# Patient Record
Sex: Female | Born: 1935
Health system: Southern US, Community
[De-identification: ages and names within clinical notes are randomized; demographics above are authoritative.]

## PROBLEM LIST (undated history)

## (undated) DIAGNOSIS — K265 Chronic or unspecified duodenal ulcer with perforation: Secondary | ICD-10-CM

## (undated) DIAGNOSIS — I6529 Occlusion and stenosis of unspecified carotid artery: Secondary | ICD-10-CM

## (undated) DIAGNOSIS — K551 Chronic vascular disorders of intestine: Secondary | ICD-10-CM

## (undated) DIAGNOSIS — I480 Paroxysmal atrial fibrillation: Secondary | ICD-10-CM

## (undated) DIAGNOSIS — I10 Essential (primary) hypertension: Secondary | ICD-10-CM

## (undated) DIAGNOSIS — Z9289 Personal history of other medical treatment: Secondary | ICD-10-CM

## (undated) DIAGNOSIS — J449 Chronic obstructive pulmonary disease, unspecified: Secondary | ICD-10-CM

## (undated) DIAGNOSIS — C50919 Malignant neoplasm of unspecified site of unspecified female breast: Secondary | ICD-10-CM

## (undated) DIAGNOSIS — E669 Obesity, unspecified: Secondary | ICD-10-CM

## (undated) DIAGNOSIS — I739 Peripheral vascular disease, unspecified: Secondary | ICD-10-CM

## (undated) DIAGNOSIS — I5042 Chronic combined systolic (congestive) and diastolic (congestive) heart failure: Secondary | ICD-10-CM

## (undated) DIAGNOSIS — R5381 Other malaise: Secondary | ICD-10-CM

## (undated) DIAGNOSIS — G5600 Carpal tunnel syndrome, unspecified upper limb: Secondary | ICD-10-CM

## (undated) DIAGNOSIS — I771 Stricture of artery: Secondary | ICD-10-CM

## (undated) DIAGNOSIS — E039 Hypothyroidism, unspecified: Secondary | ICD-10-CM

## (undated) DIAGNOSIS — R32 Unspecified urinary incontinence: Secondary | ICD-10-CM

## (undated) DIAGNOSIS — L700 Acne vulgaris: Secondary | ICD-10-CM

## (undated) DIAGNOSIS — E785 Hyperlipidemia, unspecified: Secondary | ICD-10-CM

## (undated) DIAGNOSIS — C50412 Malignant neoplasm of upper-outer quadrant of left female breast: Secondary | ICD-10-CM

## (undated) DIAGNOSIS — R159 Full incontinence of feces: Secondary | ICD-10-CM

## (undated) DIAGNOSIS — M1990 Unspecified osteoarthritis, unspecified site: Secondary | ICD-10-CM

## (undated) DIAGNOSIS — H409 Unspecified glaucoma: Secondary | ICD-10-CM

## (undated) DIAGNOSIS — E118 Type 2 diabetes mellitus with unspecified complications: Secondary | ICD-10-CM

## (undated) DIAGNOSIS — Z87898 Personal history of other specified conditions: Secondary | ICD-10-CM

## (undated) HISTORY — DX: Personal history of other medical treatment: Z92.89

## (undated) HISTORY — DX: Essential (primary) hypertension: I10

## (undated) HISTORY — DX: Hyperlipidemia, unspecified: E78.5

## (undated) HISTORY — PX: TONSILLECTOMY: SUR1361

## (undated) HISTORY — PX: CARPAL TUNNEL RELEASE: SHX101

## (undated) HISTORY — DX: Other malaise: R53.81

## (undated) HISTORY — PX: MASTECTOMY: SHX3

## (undated) HISTORY — DX: Chronic vascular disorders of intestine: K55.1

## (undated) HISTORY — DX: Carpal tunnel syndrome, unspecified upper limb: G56.00

## (undated) HISTORY — DX: Paroxysmal atrial fibrillation: I48.0

## (undated) HISTORY — PX: UMBILICAL HERNIA REPAIR: SUR1181

## (undated) HISTORY — DX: Chronic combined systolic (congestive) and diastolic (congestive) heart failure: I50.42

## (undated) HISTORY — DX: Acne vulgaris: L70.0

## (undated) HISTORY — DX: Personal history of other specified conditions: Z87.898

## (undated) HISTORY — DX: Unspecified glaucoma: H40.9

## (undated) HISTORY — DX: Chronic or unspecified duodenal ulcer with perforation: K26.5

## (undated) HISTORY — DX: Full incontinence of feces: R15.9

## (undated) HISTORY — DX: Stricture of artery: I77.1

## (undated) HISTORY — DX: Unspecified osteoarthritis, unspecified site: M19.90

## (undated) HISTORY — DX: Hypothyroidism, unspecified: E03.9

## (undated) HISTORY — DX: Malignant neoplasm of upper-outer quadrant of left female breast: C50.412

## (undated) HISTORY — DX: Obesity, unspecified: E66.9

## (undated) HISTORY — PX: PARTIAL HYSTERECTOMY: SHX80

## (undated) HISTORY — DX: Malignant neoplasm of unspecified site of unspecified female breast: C50.919

## (undated) HISTORY — DX: Peripheral vascular disease, unspecified: I73.9

## (undated) HISTORY — DX: Type 2 diabetes mellitus with unspecified complications: E11.8

## (undated) HISTORY — DX: Unspecified urinary incontinence: R32

## (undated) HISTORY — DX: Occlusion and stenosis of unspecified carotid artery: I65.29

## (undated) HISTORY — DX: Hypomagnesemia: E83.42

---

## 2003-04-21 HISTORY — PX: BOWEL RESECTION: SHX1257

## 2004-12-14 ENCOUNTER — Emergency Department: Payer: Self-pay | Admitting: Emergency Medicine

## 2008-10-18 ENCOUNTER — Ambulatory Visit: Payer: Self-pay | Admitting: Internal Medicine

## 2008-10-30 ENCOUNTER — Inpatient Hospital Stay: Payer: Self-pay | Admitting: Internal Medicine

## 2008-11-03 ENCOUNTER — Inpatient Hospital Stay: Payer: Self-pay | Admitting: Internal Medicine

## 2008-11-18 ENCOUNTER — Ambulatory Visit: Payer: Self-pay | Admitting: Internal Medicine

## 2008-12-06 ENCOUNTER — Ambulatory Visit: Payer: Self-pay | Admitting: Internal Medicine

## 2008-12-09 ENCOUNTER — Inpatient Hospital Stay: Payer: Self-pay | Admitting: Specialist

## 2010-04-04 ENCOUNTER — Inpatient Hospital Stay: Payer: Self-pay | Admitting: Internal Medicine

## 2011-04-02 DIAGNOSIS — D510 Vitamin B12 deficiency anemia due to intrinsic factor deficiency: Secondary | ICD-10-CM | POA: Diagnosis not present

## 2011-04-02 DIAGNOSIS — M6281 Muscle weakness (generalized): Secondary | ICD-10-CM | POA: Diagnosis not present

## 2011-04-02 DIAGNOSIS — I4891 Unspecified atrial fibrillation: Secondary | ICD-10-CM | POA: Diagnosis not present

## 2011-04-02 DIAGNOSIS — E119 Type 2 diabetes mellitus without complications: Secondary | ICD-10-CM | POA: Diagnosis not present

## 2011-04-02 DIAGNOSIS — R55 Syncope and collapse: Secondary | ICD-10-CM | POA: Diagnosis not present

## 2011-04-02 DIAGNOSIS — I951 Orthostatic hypotension: Secondary | ICD-10-CM | POA: Diagnosis not present

## 2011-04-04 ENCOUNTER — Inpatient Hospital Stay: Payer: Self-pay | Admitting: Internal Medicine

## 2011-04-04 DIAGNOSIS — I359 Nonrheumatic aortic valve disorder, unspecified: Secondary | ICD-10-CM

## 2011-04-04 DIAGNOSIS — I4891 Unspecified atrial fibrillation: Secondary | ICD-10-CM

## 2011-04-06 DIAGNOSIS — I959 Hypotension, unspecified: Secondary | ICD-10-CM

## 2011-04-08 DIAGNOSIS — I951 Orthostatic hypotension: Secondary | ICD-10-CM | POA: Diagnosis not present

## 2011-04-08 DIAGNOSIS — I4891 Unspecified atrial fibrillation: Secondary | ICD-10-CM | POA: Diagnosis not present

## 2011-04-08 DIAGNOSIS — E119 Type 2 diabetes mellitus without complications: Secondary | ICD-10-CM | POA: Diagnosis not present

## 2011-04-08 DIAGNOSIS — D510 Vitamin B12 deficiency anemia due to intrinsic factor deficiency: Secondary | ICD-10-CM | POA: Diagnosis not present

## 2011-04-08 DIAGNOSIS — R55 Syncope and collapse: Secondary | ICD-10-CM | POA: Diagnosis not present

## 2011-04-08 DIAGNOSIS — M6281 Muscle weakness (generalized): Secondary | ICD-10-CM | POA: Diagnosis not present

## 2011-04-09 DIAGNOSIS — E119 Type 2 diabetes mellitus without complications: Secondary | ICD-10-CM | POA: Diagnosis not present

## 2011-04-09 DIAGNOSIS — I4891 Unspecified atrial fibrillation: Secondary | ICD-10-CM | POA: Diagnosis not present

## 2011-04-09 DIAGNOSIS — M6281 Muscle weakness (generalized): Secondary | ICD-10-CM | POA: Diagnosis not present

## 2011-04-09 DIAGNOSIS — I951 Orthostatic hypotension: Secondary | ICD-10-CM | POA: Diagnosis not present

## 2011-04-09 DIAGNOSIS — R55 Syncope and collapse: Secondary | ICD-10-CM | POA: Diagnosis not present

## 2011-04-09 DIAGNOSIS — D510 Vitamin B12 deficiency anemia due to intrinsic factor deficiency: Secondary | ICD-10-CM | POA: Diagnosis not present

## 2011-04-16 DIAGNOSIS — R55 Syncope and collapse: Secondary | ICD-10-CM | POA: Diagnosis not present

## 2011-04-16 DIAGNOSIS — M6281 Muscle weakness (generalized): Secondary | ICD-10-CM | POA: Diagnosis not present

## 2011-04-16 DIAGNOSIS — E119 Type 2 diabetes mellitus without complications: Secondary | ICD-10-CM | POA: Diagnosis not present

## 2011-04-16 DIAGNOSIS — I951 Orthostatic hypotension: Secondary | ICD-10-CM | POA: Diagnosis not present

## 2011-04-16 DIAGNOSIS — D510 Vitamin B12 deficiency anemia due to intrinsic factor deficiency: Secondary | ICD-10-CM | POA: Diagnosis not present

## 2011-04-16 DIAGNOSIS — I4891 Unspecified atrial fibrillation: Secondary | ICD-10-CM | POA: Diagnosis not present

## 2011-04-17 DIAGNOSIS — R55 Syncope and collapse: Secondary | ICD-10-CM | POA: Diagnosis not present

## 2011-04-17 DIAGNOSIS — I951 Orthostatic hypotension: Secondary | ICD-10-CM | POA: Diagnosis not present

## 2011-04-17 DIAGNOSIS — I4891 Unspecified atrial fibrillation: Secondary | ICD-10-CM | POA: Diagnosis not present

## 2011-04-17 DIAGNOSIS — E119 Type 2 diabetes mellitus without complications: Secondary | ICD-10-CM | POA: Diagnosis not present

## 2011-04-17 DIAGNOSIS — D510 Vitamin B12 deficiency anemia due to intrinsic factor deficiency: Secondary | ICD-10-CM | POA: Diagnosis not present

## 2011-04-17 DIAGNOSIS — M6281 Muscle weakness (generalized): Secondary | ICD-10-CM | POA: Diagnosis not present

## 2011-04-20 DIAGNOSIS — M6281 Muscle weakness (generalized): Secondary | ICD-10-CM | POA: Diagnosis not present

## 2011-04-20 DIAGNOSIS — I951 Orthostatic hypotension: Secondary | ICD-10-CM | POA: Diagnosis not present

## 2011-04-20 DIAGNOSIS — R55 Syncope and collapse: Secondary | ICD-10-CM | POA: Diagnosis not present

## 2011-04-20 DIAGNOSIS — I4891 Unspecified atrial fibrillation: Secondary | ICD-10-CM | POA: Diagnosis not present

## 2011-04-20 DIAGNOSIS — D510 Vitamin B12 deficiency anemia due to intrinsic factor deficiency: Secondary | ICD-10-CM | POA: Diagnosis not present

## 2011-04-20 DIAGNOSIS — E119 Type 2 diabetes mellitus without complications: Secondary | ICD-10-CM | POA: Diagnosis not present

## 2011-04-22 DIAGNOSIS — I951 Orthostatic hypotension: Secondary | ICD-10-CM | POA: Diagnosis not present

## 2011-04-22 DIAGNOSIS — D510 Vitamin B12 deficiency anemia due to intrinsic factor deficiency: Secondary | ICD-10-CM | POA: Diagnosis not present

## 2011-04-22 DIAGNOSIS — R55 Syncope and collapse: Secondary | ICD-10-CM | POA: Diagnosis not present

## 2011-04-22 DIAGNOSIS — E119 Type 2 diabetes mellitus without complications: Secondary | ICD-10-CM | POA: Diagnosis not present

## 2011-04-22 DIAGNOSIS — M6281 Muscle weakness (generalized): Secondary | ICD-10-CM | POA: Diagnosis not present

## 2011-04-22 DIAGNOSIS — I4891 Unspecified atrial fibrillation: Secondary | ICD-10-CM | POA: Diagnosis not present

## 2011-04-23 DIAGNOSIS — I4891 Unspecified atrial fibrillation: Secondary | ICD-10-CM | POA: Diagnosis not present

## 2011-04-23 DIAGNOSIS — R55 Syncope and collapse: Secondary | ICD-10-CM | POA: Diagnosis not present

## 2011-04-23 DIAGNOSIS — D510 Vitamin B12 deficiency anemia due to intrinsic factor deficiency: Secondary | ICD-10-CM | POA: Diagnosis not present

## 2011-04-23 DIAGNOSIS — M6281 Muscle weakness (generalized): Secondary | ICD-10-CM | POA: Diagnosis not present

## 2011-04-23 DIAGNOSIS — I951 Orthostatic hypotension: Secondary | ICD-10-CM | POA: Diagnosis not present

## 2011-04-23 DIAGNOSIS — E119 Type 2 diabetes mellitus without complications: Secondary | ICD-10-CM | POA: Diagnosis not present

## 2011-04-24 DIAGNOSIS — I4891 Unspecified atrial fibrillation: Secondary | ICD-10-CM | POA: Diagnosis not present

## 2011-04-27 DIAGNOSIS — M6281 Muscle weakness (generalized): Secondary | ICD-10-CM | POA: Diagnosis not present

## 2011-04-27 DIAGNOSIS — I951 Orthostatic hypotension: Secondary | ICD-10-CM | POA: Diagnosis not present

## 2011-04-27 DIAGNOSIS — I4891 Unspecified atrial fibrillation: Secondary | ICD-10-CM | POA: Diagnosis not present

## 2011-04-27 DIAGNOSIS — E119 Type 2 diabetes mellitus without complications: Secondary | ICD-10-CM | POA: Diagnosis not present

## 2011-04-27 DIAGNOSIS — R55 Syncope and collapse: Secondary | ICD-10-CM | POA: Diagnosis not present

## 2011-04-27 DIAGNOSIS — D510 Vitamin B12 deficiency anemia due to intrinsic factor deficiency: Secondary | ICD-10-CM | POA: Diagnosis not present

## 2011-04-30 DIAGNOSIS — I4891 Unspecified atrial fibrillation: Secondary | ICD-10-CM | POA: Diagnosis not present

## 2011-04-30 DIAGNOSIS — R55 Syncope and collapse: Secondary | ICD-10-CM | POA: Diagnosis not present

## 2011-04-30 DIAGNOSIS — E119 Type 2 diabetes mellitus without complications: Secondary | ICD-10-CM | POA: Diagnosis not present

## 2011-04-30 DIAGNOSIS — I951 Orthostatic hypotension: Secondary | ICD-10-CM | POA: Diagnosis not present

## 2011-04-30 DIAGNOSIS — M6281 Muscle weakness (generalized): Secondary | ICD-10-CM | POA: Diagnosis not present

## 2011-04-30 DIAGNOSIS — D510 Vitamin B12 deficiency anemia due to intrinsic factor deficiency: Secondary | ICD-10-CM | POA: Diagnosis not present

## 2011-05-01 DIAGNOSIS — I4891 Unspecified atrial fibrillation: Secondary | ICD-10-CM | POA: Diagnosis not present

## 2011-05-04 DIAGNOSIS — I951 Orthostatic hypotension: Secondary | ICD-10-CM | POA: Diagnosis not present

## 2011-05-04 DIAGNOSIS — E119 Type 2 diabetes mellitus without complications: Secondary | ICD-10-CM | POA: Diagnosis not present

## 2011-05-04 DIAGNOSIS — D510 Vitamin B12 deficiency anemia due to intrinsic factor deficiency: Secondary | ICD-10-CM | POA: Diagnosis not present

## 2011-05-04 DIAGNOSIS — R55 Syncope and collapse: Secondary | ICD-10-CM | POA: Diagnosis not present

## 2011-05-04 DIAGNOSIS — I4891 Unspecified atrial fibrillation: Secondary | ICD-10-CM | POA: Diagnosis not present

## 2011-05-04 DIAGNOSIS — M6281 Muscle weakness (generalized): Secondary | ICD-10-CM | POA: Diagnosis not present

## 2011-05-06 DIAGNOSIS — D510 Vitamin B12 deficiency anemia due to intrinsic factor deficiency: Secondary | ICD-10-CM | POA: Diagnosis not present

## 2011-05-06 DIAGNOSIS — I951 Orthostatic hypotension: Secondary | ICD-10-CM | POA: Diagnosis not present

## 2011-05-06 DIAGNOSIS — E119 Type 2 diabetes mellitus without complications: Secondary | ICD-10-CM | POA: Diagnosis not present

## 2011-05-06 DIAGNOSIS — M6281 Muscle weakness (generalized): Secondary | ICD-10-CM | POA: Diagnosis not present

## 2011-05-06 DIAGNOSIS — R55 Syncope and collapse: Secondary | ICD-10-CM | POA: Diagnosis not present

## 2011-05-06 DIAGNOSIS — I4891 Unspecified atrial fibrillation: Secondary | ICD-10-CM | POA: Diagnosis not present

## 2011-05-07 DIAGNOSIS — I951 Orthostatic hypotension: Secondary | ICD-10-CM | POA: Diagnosis not present

## 2011-05-07 DIAGNOSIS — I4891 Unspecified atrial fibrillation: Secondary | ICD-10-CM | POA: Diagnosis not present

## 2011-05-07 DIAGNOSIS — D510 Vitamin B12 deficiency anemia due to intrinsic factor deficiency: Secondary | ICD-10-CM | POA: Diagnosis not present

## 2011-05-07 DIAGNOSIS — M6281 Muscle weakness (generalized): Secondary | ICD-10-CM | POA: Diagnosis not present

## 2011-05-07 DIAGNOSIS — E119 Type 2 diabetes mellitus without complications: Secondary | ICD-10-CM | POA: Diagnosis not present

## 2011-05-07 DIAGNOSIS — R55 Syncope and collapse: Secondary | ICD-10-CM | POA: Diagnosis not present

## 2011-05-11 DIAGNOSIS — I4891 Unspecified atrial fibrillation: Secondary | ICD-10-CM | POA: Diagnosis not present

## 2011-05-11 DIAGNOSIS — R404 Transient alteration of awareness: Secondary | ICD-10-CM | POA: Diagnosis not present

## 2011-05-11 DIAGNOSIS — G309 Alzheimer's disease, unspecified: Secondary | ICD-10-CM | POA: Diagnosis not present

## 2011-05-12 DIAGNOSIS — D510 Vitamin B12 deficiency anemia due to intrinsic factor deficiency: Secondary | ICD-10-CM | POA: Diagnosis not present

## 2011-05-12 DIAGNOSIS — I4891 Unspecified atrial fibrillation: Secondary | ICD-10-CM | POA: Diagnosis not present

## 2011-05-12 DIAGNOSIS — R55 Syncope and collapse: Secondary | ICD-10-CM | POA: Diagnosis not present

## 2011-05-12 DIAGNOSIS — E119 Type 2 diabetes mellitus without complications: Secondary | ICD-10-CM | POA: Diagnosis not present

## 2011-05-12 DIAGNOSIS — M6281 Muscle weakness (generalized): Secondary | ICD-10-CM | POA: Diagnosis not present

## 2011-05-12 DIAGNOSIS — I951 Orthostatic hypotension: Secondary | ICD-10-CM | POA: Diagnosis not present

## 2011-05-14 DIAGNOSIS — I951 Orthostatic hypotension: Secondary | ICD-10-CM | POA: Diagnosis not present

## 2011-05-14 DIAGNOSIS — I4891 Unspecified atrial fibrillation: Secondary | ICD-10-CM | POA: Diagnosis not present

## 2011-05-14 DIAGNOSIS — D510 Vitamin B12 deficiency anemia due to intrinsic factor deficiency: Secondary | ICD-10-CM | POA: Diagnosis not present

## 2011-05-14 DIAGNOSIS — M6281 Muscle weakness (generalized): Secondary | ICD-10-CM | POA: Diagnosis not present

## 2011-05-14 DIAGNOSIS — E119 Type 2 diabetes mellitus without complications: Secondary | ICD-10-CM | POA: Diagnosis not present

## 2011-05-14 DIAGNOSIS — R55 Syncope and collapse: Secondary | ICD-10-CM | POA: Diagnosis not present

## 2011-05-18 DIAGNOSIS — I4891 Unspecified atrial fibrillation: Secondary | ICD-10-CM | POA: Diagnosis not present

## 2011-05-18 DIAGNOSIS — R55 Syncope and collapse: Secondary | ICD-10-CM | POA: Diagnosis not present

## 2011-05-18 DIAGNOSIS — M6281 Muscle weakness (generalized): Secondary | ICD-10-CM | POA: Diagnosis not present

## 2011-05-18 DIAGNOSIS — E119 Type 2 diabetes mellitus without complications: Secondary | ICD-10-CM | POA: Diagnosis not present

## 2011-05-18 DIAGNOSIS — I951 Orthostatic hypotension: Secondary | ICD-10-CM | POA: Diagnosis not present

## 2011-05-18 DIAGNOSIS — D510 Vitamin B12 deficiency anemia due to intrinsic factor deficiency: Secondary | ICD-10-CM | POA: Diagnosis not present

## 2011-05-19 DIAGNOSIS — I4891 Unspecified atrial fibrillation: Secondary | ICD-10-CM | POA: Diagnosis not present

## 2011-05-19 DIAGNOSIS — E119 Type 2 diabetes mellitus without complications: Secondary | ICD-10-CM | POA: Diagnosis not present

## 2011-05-19 DIAGNOSIS — I10 Essential (primary) hypertension: Secondary | ICD-10-CM | POA: Diagnosis not present

## 2011-05-19 DIAGNOSIS — E785 Hyperlipidemia, unspecified: Secondary | ICD-10-CM | POA: Diagnosis not present

## 2011-05-20 DIAGNOSIS — M6281 Muscle weakness (generalized): Secondary | ICD-10-CM | POA: Diagnosis not present

## 2011-05-20 DIAGNOSIS — E119 Type 2 diabetes mellitus without complications: Secondary | ICD-10-CM | POA: Diagnosis not present

## 2011-05-20 DIAGNOSIS — R55 Syncope and collapse: Secondary | ICD-10-CM | POA: Diagnosis not present

## 2011-05-20 DIAGNOSIS — I10 Essential (primary) hypertension: Secondary | ICD-10-CM | POA: Diagnosis not present

## 2011-05-20 DIAGNOSIS — I951 Orthostatic hypotension: Secondary | ICD-10-CM | POA: Diagnosis not present

## 2011-05-20 DIAGNOSIS — Z0000 Encounter for general adult medical examination without abnormal findings: Secondary | ICD-10-CM | POA: Diagnosis not present

## 2011-05-20 DIAGNOSIS — E039 Hypothyroidism, unspecified: Secondary | ICD-10-CM | POA: Diagnosis not present

## 2011-05-20 DIAGNOSIS — I4891 Unspecified atrial fibrillation: Secondary | ICD-10-CM | POA: Diagnosis not present

## 2011-05-20 DIAGNOSIS — D510 Vitamin B12 deficiency anemia due to intrinsic factor deficiency: Secondary | ICD-10-CM | POA: Diagnosis not present

## 2011-05-20 DIAGNOSIS — Z23 Encounter for immunization: Secondary | ICD-10-CM | POA: Diagnosis not present

## 2011-05-22 DIAGNOSIS — E119 Type 2 diabetes mellitus without complications: Secondary | ICD-10-CM | POA: Diagnosis not present

## 2011-05-22 DIAGNOSIS — R55 Syncope and collapse: Secondary | ICD-10-CM | POA: Diagnosis not present

## 2011-05-22 DIAGNOSIS — I4891 Unspecified atrial fibrillation: Secondary | ICD-10-CM | POA: Diagnosis not present

## 2011-05-22 DIAGNOSIS — D510 Vitamin B12 deficiency anemia due to intrinsic factor deficiency: Secondary | ICD-10-CM | POA: Diagnosis not present

## 2011-05-22 DIAGNOSIS — M6281 Muscle weakness (generalized): Secondary | ICD-10-CM | POA: Diagnosis not present

## 2011-05-22 DIAGNOSIS — I951 Orthostatic hypotension: Secondary | ICD-10-CM | POA: Diagnosis not present

## 2011-05-25 DIAGNOSIS — D510 Vitamin B12 deficiency anemia due to intrinsic factor deficiency: Secondary | ICD-10-CM | POA: Diagnosis not present

## 2011-05-25 DIAGNOSIS — I4891 Unspecified atrial fibrillation: Secondary | ICD-10-CM | POA: Diagnosis not present

## 2011-05-25 DIAGNOSIS — R55 Syncope and collapse: Secondary | ICD-10-CM | POA: Diagnosis not present

## 2011-05-25 DIAGNOSIS — E119 Type 2 diabetes mellitus without complications: Secondary | ICD-10-CM | POA: Diagnosis not present

## 2011-05-25 DIAGNOSIS — M6281 Muscle weakness (generalized): Secondary | ICD-10-CM | POA: Diagnosis not present

## 2011-05-25 DIAGNOSIS — I951 Orthostatic hypotension: Secondary | ICD-10-CM | POA: Diagnosis not present

## 2011-05-26 DIAGNOSIS — I4891 Unspecified atrial fibrillation: Secondary | ICD-10-CM | POA: Diagnosis not present

## 2011-05-28 DIAGNOSIS — I951 Orthostatic hypotension: Secondary | ICD-10-CM | POA: Diagnosis not present

## 2011-05-28 DIAGNOSIS — D510 Vitamin B12 deficiency anemia due to intrinsic factor deficiency: Secondary | ICD-10-CM | POA: Diagnosis not present

## 2011-05-28 DIAGNOSIS — E119 Type 2 diabetes mellitus without complications: Secondary | ICD-10-CM | POA: Diagnosis not present

## 2011-05-28 DIAGNOSIS — R55 Syncope and collapse: Secondary | ICD-10-CM | POA: Diagnosis not present

## 2011-05-28 DIAGNOSIS — M6281 Muscle weakness (generalized): Secondary | ICD-10-CM | POA: Diagnosis not present

## 2011-05-28 DIAGNOSIS — I4891 Unspecified atrial fibrillation: Secondary | ICD-10-CM | POA: Diagnosis not present

## 2011-06-01 ENCOUNTER — Encounter: Payer: Self-pay | Admitting: *Deleted

## 2011-06-01 DIAGNOSIS — I4891 Unspecified atrial fibrillation: Secondary | ICD-10-CM | POA: Diagnosis not present

## 2011-06-01 DIAGNOSIS — I951 Orthostatic hypotension: Secondary | ICD-10-CM | POA: Diagnosis not present

## 2011-06-01 DIAGNOSIS — E119 Type 2 diabetes mellitus without complications: Secondary | ICD-10-CM | POA: Diagnosis not present

## 2011-06-01 DIAGNOSIS — D510 Vitamin B12 deficiency anemia due to intrinsic factor deficiency: Secondary | ICD-10-CM | POA: Diagnosis not present

## 2011-06-01 DIAGNOSIS — R55 Syncope and collapse: Secondary | ICD-10-CM | POA: Diagnosis not present

## 2011-06-01 DIAGNOSIS — M6281 Muscle weakness (generalized): Secondary | ICD-10-CM | POA: Diagnosis not present

## 2011-06-02 DIAGNOSIS — E119 Type 2 diabetes mellitus without complications: Secondary | ICD-10-CM | POA: Diagnosis not present

## 2011-06-02 DIAGNOSIS — M6281 Muscle weakness (generalized): Secondary | ICD-10-CM | POA: Diagnosis not present

## 2011-06-02 DIAGNOSIS — I4891 Unspecified atrial fibrillation: Secondary | ICD-10-CM | POA: Diagnosis not present

## 2011-06-02 DIAGNOSIS — I951 Orthostatic hypotension: Secondary | ICD-10-CM | POA: Diagnosis not present

## 2011-06-02 DIAGNOSIS — D510 Vitamin B12 deficiency anemia due to intrinsic factor deficiency: Secondary | ICD-10-CM | POA: Diagnosis not present

## 2011-06-02 DIAGNOSIS — R55 Syncope and collapse: Secondary | ICD-10-CM | POA: Diagnosis not present

## 2011-06-03 DIAGNOSIS — I4891 Unspecified atrial fibrillation: Secondary | ICD-10-CM | POA: Diagnosis not present

## 2011-06-04 ENCOUNTER — Ambulatory Visit (INDEPENDENT_AMBULATORY_CARE_PROVIDER_SITE_OTHER): Payer: Medicare Other | Admitting: Cardiovascular Disease

## 2011-06-04 ENCOUNTER — Encounter: Payer: Self-pay | Admitting: Cardiovascular Disease

## 2011-06-04 VITALS — BP 162/86 | HR 84 | Ht 65.0 in | Wt 196.8 lb

## 2011-06-04 DIAGNOSIS — E669 Obesity, unspecified: Secondary | ICD-10-CM

## 2011-06-04 DIAGNOSIS — R569 Unspecified convulsions: Secondary | ICD-10-CM

## 2011-06-04 DIAGNOSIS — I6350 Cerebral infarction due to unspecified occlusion or stenosis of unspecified cerebral artery: Secondary | ICD-10-CM

## 2011-06-04 DIAGNOSIS — J449 Chronic obstructive pulmonary disease, unspecified: Secondary | ICD-10-CM

## 2011-06-04 DIAGNOSIS — E119 Type 2 diabetes mellitus without complications: Secondary | ICD-10-CM

## 2011-06-04 DIAGNOSIS — I639 Cerebral infarction, unspecified: Secondary | ICD-10-CM

## 2011-06-04 DIAGNOSIS — I4891 Unspecified atrial fibrillation: Secondary | ICD-10-CM | POA: Diagnosis not present

## 2011-06-04 HISTORY — DX: Obesity, unspecified: E66.9

## 2011-06-04 MED ORDER — METOPROLOL SUCCINATE ER 100 MG PO TB24: 100.0000 mg | ORAL_TABLET | Freq: Two times a day (BID) | ORAL | Status: DC

## 2011-06-04 NOTE — Assessment & Plan Note (Addendum)
We have encouraged continued exercise, careful diet management in an effort to lose weight. She is limited by her chronic knee pain.

## 2011-06-04 NOTE — Assessment & Plan Note (Signed)
She appears to be maintaining normal sinus rhythm currently. Heart rate and blood pressure are mildly elevated. We will add extra metoprolol succinate 50 mg in the p.m. In addition to 100 mg in the a.m.Marland Kitchen

## 2011-06-04 NOTE — Progress Notes (Signed)
Patient ID: Mia Padilla, female    DOB: March 14, 1935, 77 y.o.   MRN: [id number]  HPI Comments: Mia Padilla is a 77 year old woman with history of stroke, seizure disorder, hypertension, COPD, diabetes, hypothyroidism, obesity, atrial fibrillation on recent hospital admission December 15 for mental status changes. During her hospital course, she had an echocardiogram showing normal LV function, carotid ultrasound showing no significant disease, head CT and MRI showing no significant stroke. She reports having a "code" called on her in the hospital for inability to form a sleep. Notes indicate possible TIA. She was started on warfarin for atrial fibrillation and presents today in normal sinus rhythm.  She reports that her energy is reasonably good. She has severe bilateral knee pain and hip pain. She denies any significant shortness of breath or chest pain. She does not carry a diagnosis of coronary artery disease. It does not appear that she has had a recent stress test.  Notes indicate that she has had a history of seizures, possibly with low glucose levels. Previous diagnosis of complex partial seizures and she was started on antiseizure medications. She reports that she is being weaned off her keppra.  She currently has home nursing. She does not know what her blood pressure measurements are at home though believes they are reasonable.   EKG shows normal sinus rhythm with rate 90 beats per minute with nonspecific ST abnormality EKG in the emergency room showed atrial fibrillation with ventricular rate 67 beats per minute   Outpatient Encounter Prescriptions as of 06/04/2011  Medication Sig Dispense Refill  . albuterol (PROVENTIL HFA;VENTOLIN HFA) 108 (90 BASE) MCG/ACT inhaler Inhale 2 puffs into the lungs every 6 (six) hours as needed.      Marland Kitchen aspirin EC 81 MG tablet Take 81 mg by mouth daily.      . brimonidine (ALPHAGAN P) 0.1 % SOLN       . digoxin (LANOXIN) 0.125 MG tablet Take 125 mcg by mouth  daily.      . dorzolamide (TRUSOPT) 2 % ophthalmic solution 1 drop 3 (three) times daily.      . fluticasone (FLONASE) 50 MCG/ACT nasal spray Place 2 sprays into the nose daily.      . Fluticasone-Salmeterol (ADVAIR) 250-50 MCG/DOSE AEPB Inhale 1 puff into the lungs every 12 (twelve) hours.      Marland Kitchen guaiFENesin (MUCINEX) 600 MG 12 hr tablet Take 1,200 mg by mouth 2 (two) times daily.      . insulin aspart (NOVOLOG) 100 UNIT/ML injection Inject into the skin. Sliding scale      . insulin detemir (LEVEMIR) 100 UNIT/ML injection Inject 23 Units into the skin at bedtime.      Marland Kitchen levothyroxine (SYNTHROID, LEVOTHROID) 75 MCG tablet Take 75 mcg by mouth daily.      . meclizine (ANTIVERT) 25 MG tablet Take 25 mg by mouth 3 (three) times daily as needed.      . metFORMIN (GLUCOPHAGE) 500 MG tablet Take 500 mg by mouth 2 (two) times daily with a meal.      . METOPROLOL SUCCINATE ER PO Take 100 mg by mouth daily.       . simvastatin (ZOCOR) 40 MG tablet Take 40 mg by mouth every evening.      . solifenacin (VESICARE) 5 MG tablet Take 10 mg by mouth daily.      Marland Kitchen warfarin (COUMADIN) 10 MG tablet Take 10 mg by mouth daily.       Review of Systems  Constitutional:  Negative.   HENT: Negative.   Eyes: Negative.   Respiratory: Negative.   Cardiovascular: Negative.   Gastrointestinal: Negative.   Musculoskeletal: Positive for back pain, joint swelling, arthralgias and gait problem.  Skin: Negative.   Neurological: Negative.   Hematological: Negative.   Psychiatric/Behavioral: Negative.   All other systems reviewed and are negative.    BP 162/86  Pulse 84  Ht 5\' 5"  (1.651 m)  Wt 196 lb 12 oz (89.245 kg)  BMI 32.74 kg/m2  Physical Exam  Nursing note and vitals reviewed. Constitutional: She is oriented to person, place, and time. She appears well-developed and well-nourished.       Obese, walks with a walker  HENT:  Head: Normocephalic.  Nose: Nose normal.  Mouth/Throat: Oropharynx is clear and  moist.  Eyes: Conjunctivae are normal. Pupils are equal, round, and reactive to light.  Neck: Normal range of motion. Neck supple. No JVD present.  Cardiovascular: Normal rate, regular rhythm, S1 normal, S2 normal and intact distal pulses.  Exam reveals no gallop and no friction rub.   Murmur heard.  Crescendo systolic murmur is present with a grade of 2/6  Pulmonary/Chest: Effort normal and breath sounds normal. No respiratory distress. She has no wheezes. She has no rales. She exhibits no tenderness.  Abdominal: Soft. Bowel sounds are normal. She exhibits no distension. There is no tenderness.  Musculoskeletal: Normal range of motion. She exhibits no edema and no tenderness.  Lymphadenopathy:    She has no cervical adenopathy.  Neurological: She is alert and oriented to person, place, and time. Coordination normal.  Skin: Skin is warm and dry. No rash noted. No erythema.  Psychiatric: She has a normal mood and affect. Her behavior is normal. Judgment and thought content normal.         Assessment and Plan

## 2011-06-04 NOTE — Patient Instructions (Signed)
You are doing well. Please increase the metoprolol.   Take a full pill in the morning and a 1/2 pill at dinner  If you have any worsening shortness of breath or chest pain, please call the office  Please call us if you have new issues that need to be addressed before your next appt.  Your physician wants you to follow-up in: 3 months.  You will receive a reminder letter in the mail two months in advance. If you don't receive a letter, please call our office to schedule the follow-up appointment.

## 2011-06-04 NOTE — Assessment & Plan Note (Signed)
Recent hospital admission in December 2012 concerning for TIA. She is currently on warfarin for witnessed atrial fibrillation.

## 2011-06-04 NOTE — Assessment & Plan Note (Signed)
She is seen by Dr. Sherryll Burger. No stroke seen on MRI.

## 2011-06-04 NOTE — Assessment & Plan Note (Signed)
She denies any significant shortness of breath. Currently does not smoke. She stopped smoking in the 1970s.

## 2011-06-17 DIAGNOSIS — I4891 Unspecified atrial fibrillation: Secondary | ICD-10-CM | POA: Diagnosis not present

## 2011-06-18 DIAGNOSIS — H259 Unspecified age-related cataract: Secondary | ICD-10-CM | POA: Diagnosis not present

## 2011-06-18 DIAGNOSIS — H4011X0 Primary open-angle glaucoma, stage unspecified: Secondary | ICD-10-CM | POA: Diagnosis not present

## 2011-06-18 DIAGNOSIS — H409 Unspecified glaucoma: Secondary | ICD-10-CM | POA: Diagnosis not present

## 2011-06-22 DIAGNOSIS — I4891 Unspecified atrial fibrillation: Secondary | ICD-10-CM | POA: Diagnosis not present

## 2011-06-22 DIAGNOSIS — R55 Syncope and collapse: Secondary | ICD-10-CM | POA: Diagnosis not present

## 2011-06-22 DIAGNOSIS — D510 Vitamin B12 deficiency anemia due to intrinsic factor deficiency: Secondary | ICD-10-CM | POA: Diagnosis not present

## 2011-06-22 DIAGNOSIS — I951 Orthostatic hypotension: Secondary | ICD-10-CM | POA: Diagnosis not present

## 2011-06-24 DIAGNOSIS — I4891 Unspecified atrial fibrillation: Secondary | ICD-10-CM | POA: Diagnosis not present

## 2011-06-30 DIAGNOSIS — I4891 Unspecified atrial fibrillation: Secondary | ICD-10-CM | POA: Diagnosis not present

## 2011-06-30 DIAGNOSIS — E119 Type 2 diabetes mellitus without complications: Secondary | ICD-10-CM | POA: Diagnosis not present

## 2011-06-30 DIAGNOSIS — D510 Vitamin B12 deficiency anemia due to intrinsic factor deficiency: Secondary | ICD-10-CM | POA: Diagnosis not present

## 2011-06-30 DIAGNOSIS — M6281 Muscle weakness (generalized): Secondary | ICD-10-CM | POA: Diagnosis not present

## 2011-06-30 DIAGNOSIS — R55 Syncope and collapse: Secondary | ICD-10-CM | POA: Diagnosis not present

## 2011-06-30 DIAGNOSIS — I951 Orthostatic hypotension: Secondary | ICD-10-CM | POA: Diagnosis not present

## 2011-07-08 DIAGNOSIS — I4891 Unspecified atrial fibrillation: Secondary | ICD-10-CM | POA: Diagnosis not present

## 2011-07-17 ENCOUNTER — Encounter: Payer: Self-pay | Admitting: Cardiovascular Disease

## 2011-07-21 DIAGNOSIS — I872 Venous insufficiency (chronic) (peripheral): Secondary | ICD-10-CM | POA: Diagnosis not present

## 2011-07-21 DIAGNOSIS — I10 Essential (primary) hypertension: Secondary | ICD-10-CM | POA: Diagnosis not present

## 2011-07-21 DIAGNOSIS — M7989 Other specified soft tissue disorders: Secondary | ICD-10-CM | POA: Diagnosis not present

## 2011-07-22 DIAGNOSIS — I4891 Unspecified atrial fibrillation: Secondary | ICD-10-CM | POA: Diagnosis not present

## 2011-07-28 DIAGNOSIS — D510 Vitamin B12 deficiency anemia due to intrinsic factor deficiency: Secondary | ICD-10-CM | POA: Diagnosis not present

## 2011-07-28 DIAGNOSIS — I951 Orthostatic hypotension: Secondary | ICD-10-CM | POA: Diagnosis not present

## 2011-07-28 DIAGNOSIS — I4891 Unspecified atrial fibrillation: Secondary | ICD-10-CM | POA: Diagnosis not present

## 2011-07-28 DIAGNOSIS — R55 Syncope and collapse: Secondary | ICD-10-CM | POA: Diagnosis not present

## 2011-07-28 DIAGNOSIS — M6281 Muscle weakness (generalized): Secondary | ICD-10-CM | POA: Diagnosis not present

## 2011-07-28 DIAGNOSIS — E119 Type 2 diabetes mellitus without complications: Secondary | ICD-10-CM | POA: Diagnosis not present

## 2011-07-31 DIAGNOSIS — I4891 Unspecified atrial fibrillation: Secondary | ICD-10-CM | POA: Diagnosis not present

## 2011-07-31 DIAGNOSIS — E119 Type 2 diabetes mellitus without complications: Secondary | ICD-10-CM | POA: Diagnosis not present

## 2011-07-31 DIAGNOSIS — D510 Vitamin B12 deficiency anemia due to intrinsic factor deficiency: Secondary | ICD-10-CM | POA: Diagnosis not present

## 2011-07-31 DIAGNOSIS — I951 Orthostatic hypotension: Secondary | ICD-10-CM | POA: Diagnosis not present

## 2011-07-31 DIAGNOSIS — R55 Syncope and collapse: Secondary | ICD-10-CM | POA: Diagnosis not present

## 2011-07-31 DIAGNOSIS — M6281 Muscle weakness (generalized): Secondary | ICD-10-CM | POA: Diagnosis not present

## 2011-08-07 DIAGNOSIS — I4891 Unspecified atrial fibrillation: Secondary | ICD-10-CM | POA: Diagnosis not present

## 2011-08-07 DIAGNOSIS — R55 Syncope and collapse: Secondary | ICD-10-CM | POA: Diagnosis not present

## 2011-08-07 DIAGNOSIS — E119 Type 2 diabetes mellitus without complications: Secondary | ICD-10-CM | POA: Diagnosis not present

## 2011-08-07 DIAGNOSIS — M6281 Muscle weakness (generalized): Secondary | ICD-10-CM | POA: Diagnosis not present

## 2011-08-07 DIAGNOSIS — I951 Orthostatic hypotension: Secondary | ICD-10-CM | POA: Diagnosis not present

## 2011-08-07 DIAGNOSIS — D510 Vitamin B12 deficiency anemia due to intrinsic factor deficiency: Secondary | ICD-10-CM | POA: Diagnosis not present

## 2011-08-10 DIAGNOSIS — I4891 Unspecified atrial fibrillation: Secondary | ICD-10-CM | POA: Diagnosis not present

## 2011-08-10 DIAGNOSIS — M6281 Muscle weakness (generalized): Secondary | ICD-10-CM | POA: Diagnosis not present

## 2011-08-10 DIAGNOSIS — D510 Vitamin B12 deficiency anemia due to intrinsic factor deficiency: Secondary | ICD-10-CM | POA: Diagnosis not present

## 2011-08-10 DIAGNOSIS — I951 Orthostatic hypotension: Secondary | ICD-10-CM | POA: Diagnosis not present

## 2011-08-10 DIAGNOSIS — E119 Type 2 diabetes mellitus without complications: Secondary | ICD-10-CM | POA: Diagnosis not present

## 2011-08-10 DIAGNOSIS — R55 Syncope and collapse: Secondary | ICD-10-CM | POA: Diagnosis not present

## 2011-08-13 DIAGNOSIS — I4891 Unspecified atrial fibrillation: Secondary | ICD-10-CM | POA: Diagnosis not present

## 2011-08-13 DIAGNOSIS — R55 Syncope and collapse: Secondary | ICD-10-CM | POA: Diagnosis not present

## 2011-08-13 DIAGNOSIS — M6281 Muscle weakness (generalized): Secondary | ICD-10-CM | POA: Diagnosis not present

## 2011-08-13 DIAGNOSIS — I951 Orthostatic hypotension: Secondary | ICD-10-CM | POA: Diagnosis not present

## 2011-08-13 DIAGNOSIS — E119 Type 2 diabetes mellitus without complications: Secondary | ICD-10-CM | POA: Diagnosis not present

## 2011-08-13 DIAGNOSIS — D510 Vitamin B12 deficiency anemia due to intrinsic factor deficiency: Secondary | ICD-10-CM | POA: Diagnosis not present

## 2011-08-18 DIAGNOSIS — M6281 Muscle weakness (generalized): Secondary | ICD-10-CM | POA: Diagnosis not present

## 2011-08-18 DIAGNOSIS — E039 Hypothyroidism, unspecified: Secondary | ICD-10-CM | POA: Diagnosis not present

## 2011-08-18 DIAGNOSIS — E119 Type 2 diabetes mellitus without complications: Secondary | ICD-10-CM | POA: Diagnosis not present

## 2011-08-18 DIAGNOSIS — I4891 Unspecified atrial fibrillation: Secondary | ICD-10-CM | POA: Diagnosis not present

## 2011-08-18 DIAGNOSIS — D510 Vitamin B12 deficiency anemia due to intrinsic factor deficiency: Secondary | ICD-10-CM | POA: Diagnosis not present

## 2011-08-18 DIAGNOSIS — I951 Orthostatic hypotension: Secondary | ICD-10-CM | POA: Diagnosis not present

## 2011-08-18 DIAGNOSIS — R55 Syncope and collapse: Secondary | ICD-10-CM | POA: Diagnosis not present

## 2011-08-18 DIAGNOSIS — G40309 Generalized idiopathic epilepsy and epileptic syndromes, not intractable, without status epilepticus: Secondary | ICD-10-CM | POA: Diagnosis not present

## 2011-08-20 DIAGNOSIS — I4891 Unspecified atrial fibrillation: Secondary | ICD-10-CM | POA: Diagnosis not present

## 2011-08-20 DIAGNOSIS — E119 Type 2 diabetes mellitus without complications: Secondary | ICD-10-CM | POA: Diagnosis not present

## 2011-08-20 DIAGNOSIS — D510 Vitamin B12 deficiency anemia due to intrinsic factor deficiency: Secondary | ICD-10-CM | POA: Diagnosis not present

## 2011-08-20 DIAGNOSIS — R55 Syncope and collapse: Secondary | ICD-10-CM | POA: Diagnosis not present

## 2011-08-20 DIAGNOSIS — I951 Orthostatic hypotension: Secondary | ICD-10-CM | POA: Diagnosis not present

## 2011-08-20 DIAGNOSIS — M6281 Muscle weakness (generalized): Secondary | ICD-10-CM | POA: Diagnosis not present

## 2011-08-24 DIAGNOSIS — I4891 Unspecified atrial fibrillation: Secondary | ICD-10-CM | POA: Diagnosis not present

## 2011-08-24 DIAGNOSIS — M6281 Muscle weakness (generalized): Secondary | ICD-10-CM | POA: Diagnosis not present

## 2011-08-24 DIAGNOSIS — D510 Vitamin B12 deficiency anemia due to intrinsic factor deficiency: Secondary | ICD-10-CM | POA: Diagnosis not present

## 2011-08-24 DIAGNOSIS — E119 Type 2 diabetes mellitus without complications: Secondary | ICD-10-CM | POA: Diagnosis not present

## 2011-08-24 DIAGNOSIS — R55 Syncope and collapse: Secondary | ICD-10-CM | POA: Diagnosis not present

## 2011-08-24 DIAGNOSIS — I951 Orthostatic hypotension: Secondary | ICD-10-CM | POA: Diagnosis not present

## 2011-08-25 DIAGNOSIS — I4891 Unspecified atrial fibrillation: Secondary | ICD-10-CM | POA: Diagnosis not present

## 2011-08-25 DIAGNOSIS — I951 Orthostatic hypotension: Secondary | ICD-10-CM | POA: Diagnosis not present

## 2011-08-25 DIAGNOSIS — E119 Type 2 diabetes mellitus without complications: Secondary | ICD-10-CM | POA: Diagnosis not present

## 2011-08-25 DIAGNOSIS — R55 Syncope and collapse: Secondary | ICD-10-CM | POA: Diagnosis not present

## 2011-08-25 DIAGNOSIS — M6281 Muscle weakness (generalized): Secondary | ICD-10-CM | POA: Diagnosis not present

## 2011-08-25 DIAGNOSIS — D510 Vitamin B12 deficiency anemia due to intrinsic factor deficiency: Secondary | ICD-10-CM | POA: Diagnosis not present

## 2011-08-27 DIAGNOSIS — I951 Orthostatic hypotension: Secondary | ICD-10-CM | POA: Diagnosis not present

## 2011-08-27 DIAGNOSIS — D510 Vitamin B12 deficiency anemia due to intrinsic factor deficiency: Secondary | ICD-10-CM | POA: Diagnosis not present

## 2011-08-27 DIAGNOSIS — E119 Type 2 diabetes mellitus without complications: Secondary | ICD-10-CM | POA: Diagnosis not present

## 2011-08-27 DIAGNOSIS — I4891 Unspecified atrial fibrillation: Secondary | ICD-10-CM | POA: Diagnosis not present

## 2011-08-27 DIAGNOSIS — R55 Syncope and collapse: Secondary | ICD-10-CM | POA: Diagnosis not present

## 2011-08-27 DIAGNOSIS — M6281 Muscle weakness (generalized): Secondary | ICD-10-CM | POA: Diagnosis not present

## 2011-08-31 DIAGNOSIS — I4891 Unspecified atrial fibrillation: Secondary | ICD-10-CM | POA: Diagnosis not present

## 2011-08-31 DIAGNOSIS — I951 Orthostatic hypotension: Secondary | ICD-10-CM | POA: Diagnosis not present

## 2011-08-31 DIAGNOSIS — D510 Vitamin B12 deficiency anemia due to intrinsic factor deficiency: Secondary | ICD-10-CM | POA: Diagnosis not present

## 2011-08-31 DIAGNOSIS — R55 Syncope and collapse: Secondary | ICD-10-CM | POA: Diagnosis not present

## 2011-08-31 DIAGNOSIS — E119 Type 2 diabetes mellitus without complications: Secondary | ICD-10-CM | POA: Diagnosis not present

## 2011-08-31 DIAGNOSIS — M6281 Muscle weakness (generalized): Secondary | ICD-10-CM | POA: Diagnosis not present

## 2011-09-03 DIAGNOSIS — M6281 Muscle weakness (generalized): Secondary | ICD-10-CM | POA: Diagnosis not present

## 2011-09-03 DIAGNOSIS — I951 Orthostatic hypotension: Secondary | ICD-10-CM | POA: Diagnosis not present

## 2011-09-03 DIAGNOSIS — E119 Type 2 diabetes mellitus without complications: Secondary | ICD-10-CM | POA: Diagnosis not present

## 2011-09-03 DIAGNOSIS — R55 Syncope and collapse: Secondary | ICD-10-CM | POA: Diagnosis not present

## 2011-09-03 DIAGNOSIS — I4891 Unspecified atrial fibrillation: Secondary | ICD-10-CM | POA: Diagnosis not present

## 2011-09-03 DIAGNOSIS — D510 Vitamin B12 deficiency anemia due to intrinsic factor deficiency: Secondary | ICD-10-CM | POA: Diagnosis not present

## 2011-09-07 DIAGNOSIS — E119 Type 2 diabetes mellitus without complications: Secondary | ICD-10-CM | POA: Diagnosis not present

## 2011-09-07 DIAGNOSIS — I4891 Unspecified atrial fibrillation: Secondary | ICD-10-CM | POA: Diagnosis not present

## 2011-09-07 DIAGNOSIS — M6281 Muscle weakness (generalized): Secondary | ICD-10-CM | POA: Diagnosis not present

## 2011-09-07 DIAGNOSIS — I951 Orthostatic hypotension: Secondary | ICD-10-CM | POA: Diagnosis not present

## 2011-09-07 DIAGNOSIS — D510 Vitamin B12 deficiency anemia due to intrinsic factor deficiency: Secondary | ICD-10-CM | POA: Diagnosis not present

## 2011-09-07 DIAGNOSIS — R55 Syncope and collapse: Secondary | ICD-10-CM | POA: Diagnosis not present

## 2011-09-10 DIAGNOSIS — I4891 Unspecified atrial fibrillation: Secondary | ICD-10-CM | POA: Diagnosis not present

## 2011-09-10 DIAGNOSIS — H4011X0 Primary open-angle glaucoma, stage unspecified: Secondary | ICD-10-CM | POA: Diagnosis not present

## 2011-09-10 DIAGNOSIS — E119 Type 2 diabetes mellitus without complications: Secondary | ICD-10-CM | POA: Diagnosis not present

## 2011-09-10 DIAGNOSIS — H409 Unspecified glaucoma: Secondary | ICD-10-CM | POA: Diagnosis not present

## 2011-09-10 DIAGNOSIS — D510 Vitamin B12 deficiency anemia due to intrinsic factor deficiency: Secondary | ICD-10-CM | POA: Diagnosis not present

## 2011-09-10 DIAGNOSIS — H259 Unspecified age-related cataract: Secondary | ICD-10-CM | POA: Diagnosis not present

## 2011-09-10 DIAGNOSIS — R55 Syncope and collapse: Secondary | ICD-10-CM | POA: Diagnosis not present

## 2011-09-10 DIAGNOSIS — M6281 Muscle weakness (generalized): Secondary | ICD-10-CM | POA: Diagnosis not present

## 2011-09-10 DIAGNOSIS — I951 Orthostatic hypotension: Secondary | ICD-10-CM | POA: Diagnosis not present

## 2011-09-14 DIAGNOSIS — R55 Syncope and collapse: Secondary | ICD-10-CM | POA: Diagnosis not present

## 2011-09-14 DIAGNOSIS — I4891 Unspecified atrial fibrillation: Secondary | ICD-10-CM | POA: Diagnosis not present

## 2011-09-14 DIAGNOSIS — I951 Orthostatic hypotension: Secondary | ICD-10-CM | POA: Diagnosis not present

## 2011-09-14 DIAGNOSIS — D510 Vitamin B12 deficiency anemia due to intrinsic factor deficiency: Secondary | ICD-10-CM | POA: Diagnosis not present

## 2011-09-14 DIAGNOSIS — E119 Type 2 diabetes mellitus without complications: Secondary | ICD-10-CM | POA: Diagnosis not present

## 2011-09-14 DIAGNOSIS — M6281 Muscle weakness (generalized): Secondary | ICD-10-CM | POA: Diagnosis not present

## 2011-09-16 DIAGNOSIS — I4891 Unspecified atrial fibrillation: Secondary | ICD-10-CM | POA: Diagnosis not present

## 2011-09-17 DIAGNOSIS — E119 Type 2 diabetes mellitus without complications: Secondary | ICD-10-CM | POA: Diagnosis not present

## 2011-09-17 DIAGNOSIS — M6281 Muscle weakness (generalized): Secondary | ICD-10-CM | POA: Diagnosis not present

## 2011-09-17 DIAGNOSIS — R55 Syncope and collapse: Secondary | ICD-10-CM | POA: Diagnosis not present

## 2011-09-17 DIAGNOSIS — I4891 Unspecified atrial fibrillation: Secondary | ICD-10-CM | POA: Diagnosis not present

## 2011-09-17 DIAGNOSIS — I951 Orthostatic hypotension: Secondary | ICD-10-CM | POA: Diagnosis not present

## 2011-09-17 DIAGNOSIS — D510 Vitamin B12 deficiency anemia due to intrinsic factor deficiency: Secondary | ICD-10-CM | POA: Diagnosis not present

## 2011-09-21 DIAGNOSIS — I951 Orthostatic hypotension: Secondary | ICD-10-CM | POA: Diagnosis not present

## 2011-09-21 DIAGNOSIS — M6281 Muscle weakness (generalized): Secondary | ICD-10-CM | POA: Diagnosis not present

## 2011-09-21 DIAGNOSIS — R55 Syncope and collapse: Secondary | ICD-10-CM | POA: Diagnosis not present

## 2011-09-21 DIAGNOSIS — E119 Type 2 diabetes mellitus without complications: Secondary | ICD-10-CM | POA: Diagnosis not present

## 2011-09-21 DIAGNOSIS — I4891 Unspecified atrial fibrillation: Secondary | ICD-10-CM | POA: Diagnosis not present

## 2011-09-21 DIAGNOSIS — D510 Vitamin B12 deficiency anemia due to intrinsic factor deficiency: Secondary | ICD-10-CM | POA: Diagnosis not present

## 2011-09-25 DIAGNOSIS — E119 Type 2 diabetes mellitus without complications: Secondary | ICD-10-CM | POA: Diagnosis not present

## 2011-09-25 DIAGNOSIS — I4891 Unspecified atrial fibrillation: Secondary | ICD-10-CM | POA: Diagnosis not present

## 2011-09-25 DIAGNOSIS — D510 Vitamin B12 deficiency anemia due to intrinsic factor deficiency: Secondary | ICD-10-CM | POA: Diagnosis not present

## 2011-09-25 DIAGNOSIS — M6281 Muscle weakness (generalized): Secondary | ICD-10-CM | POA: Diagnosis not present

## 2011-09-25 DIAGNOSIS — R55 Syncope and collapse: Secondary | ICD-10-CM | POA: Diagnosis not present

## 2011-09-25 DIAGNOSIS — I951 Orthostatic hypotension: Secondary | ICD-10-CM | POA: Diagnosis not present

## 2011-09-28 DIAGNOSIS — D510 Vitamin B12 deficiency anemia due to intrinsic factor deficiency: Secondary | ICD-10-CM | POA: Diagnosis not present

## 2011-09-28 DIAGNOSIS — M6281 Muscle weakness (generalized): Secondary | ICD-10-CM | POA: Diagnosis not present

## 2011-09-28 DIAGNOSIS — I951 Orthostatic hypotension: Secondary | ICD-10-CM | POA: Diagnosis not present

## 2011-09-28 DIAGNOSIS — R55 Syncope and collapse: Secondary | ICD-10-CM | POA: Diagnosis not present

## 2011-09-28 DIAGNOSIS — E119 Type 2 diabetes mellitus without complications: Secondary | ICD-10-CM | POA: Diagnosis not present

## 2011-09-28 DIAGNOSIS — I4891 Unspecified atrial fibrillation: Secondary | ICD-10-CM | POA: Diagnosis not present

## 2011-09-29 DIAGNOSIS — E119 Type 2 diabetes mellitus without complications: Secondary | ICD-10-CM | POA: Diagnosis not present

## 2011-09-29 DIAGNOSIS — D510 Vitamin B12 deficiency anemia due to intrinsic factor deficiency: Secondary | ICD-10-CM | POA: Diagnosis not present

## 2011-09-29 DIAGNOSIS — I4891 Unspecified atrial fibrillation: Secondary | ICD-10-CM | POA: Diagnosis not present

## 2011-09-29 DIAGNOSIS — M6281 Muscle weakness (generalized): Secondary | ICD-10-CM | POA: Diagnosis not present

## 2011-09-29 DIAGNOSIS — R55 Syncope and collapse: Secondary | ICD-10-CM | POA: Diagnosis not present

## 2011-09-29 DIAGNOSIS — I951 Orthostatic hypotension: Secondary | ICD-10-CM | POA: Diagnosis not present

## 2011-09-30 DIAGNOSIS — I4891 Unspecified atrial fibrillation: Secondary | ICD-10-CM | POA: Diagnosis not present

## 2011-09-30 DIAGNOSIS — D510 Vitamin B12 deficiency anemia due to intrinsic factor deficiency: Secondary | ICD-10-CM | POA: Diagnosis not present

## 2011-09-30 DIAGNOSIS — R55 Syncope and collapse: Secondary | ICD-10-CM | POA: Diagnosis not present

## 2011-09-30 DIAGNOSIS — E119 Type 2 diabetes mellitus without complications: Secondary | ICD-10-CM | POA: Diagnosis not present

## 2011-09-30 DIAGNOSIS — I951 Orthostatic hypotension: Secondary | ICD-10-CM | POA: Diagnosis not present

## 2011-09-30 DIAGNOSIS — M6281 Muscle weakness (generalized): Secondary | ICD-10-CM | POA: Diagnosis not present

## 2011-10-02 DIAGNOSIS — R55 Syncope and collapse: Secondary | ICD-10-CM | POA: Diagnosis not present

## 2011-10-02 DIAGNOSIS — I951 Orthostatic hypotension: Secondary | ICD-10-CM | POA: Diagnosis not present

## 2011-10-02 DIAGNOSIS — I4891 Unspecified atrial fibrillation: Secondary | ICD-10-CM | POA: Diagnosis not present

## 2011-10-02 DIAGNOSIS — M6281 Muscle weakness (generalized): Secondary | ICD-10-CM | POA: Diagnosis not present

## 2011-10-02 DIAGNOSIS — E119 Type 2 diabetes mellitus without complications: Secondary | ICD-10-CM | POA: Diagnosis not present

## 2011-10-02 DIAGNOSIS — D510 Vitamin B12 deficiency anemia due to intrinsic factor deficiency: Secondary | ICD-10-CM | POA: Diagnosis not present

## 2011-10-05 DIAGNOSIS — E119 Type 2 diabetes mellitus without complications: Secondary | ICD-10-CM | POA: Diagnosis not present

## 2011-10-05 DIAGNOSIS — M6281 Muscle weakness (generalized): Secondary | ICD-10-CM | POA: Diagnosis not present

## 2011-10-05 DIAGNOSIS — D510 Vitamin B12 deficiency anemia due to intrinsic factor deficiency: Secondary | ICD-10-CM | POA: Diagnosis not present

## 2011-10-05 DIAGNOSIS — I951 Orthostatic hypotension: Secondary | ICD-10-CM | POA: Diagnosis not present

## 2011-10-05 DIAGNOSIS — R55 Syncope and collapse: Secondary | ICD-10-CM | POA: Diagnosis not present

## 2011-10-05 DIAGNOSIS — I4891 Unspecified atrial fibrillation: Secondary | ICD-10-CM | POA: Diagnosis not present

## 2011-10-08 DIAGNOSIS — R55 Syncope and collapse: Secondary | ICD-10-CM | POA: Diagnosis not present

## 2011-10-08 DIAGNOSIS — D510 Vitamin B12 deficiency anemia due to intrinsic factor deficiency: Secondary | ICD-10-CM | POA: Diagnosis not present

## 2011-10-08 DIAGNOSIS — M6281 Muscle weakness (generalized): Secondary | ICD-10-CM | POA: Diagnosis not present

## 2011-10-08 DIAGNOSIS — I951 Orthostatic hypotension: Secondary | ICD-10-CM | POA: Diagnosis not present

## 2011-10-08 DIAGNOSIS — E119 Type 2 diabetes mellitus without complications: Secondary | ICD-10-CM | POA: Diagnosis not present

## 2011-10-08 DIAGNOSIS — I4891 Unspecified atrial fibrillation: Secondary | ICD-10-CM | POA: Diagnosis not present

## 2011-10-13 DIAGNOSIS — I951 Orthostatic hypotension: Secondary | ICD-10-CM | POA: Diagnosis not present

## 2011-10-13 DIAGNOSIS — I4891 Unspecified atrial fibrillation: Secondary | ICD-10-CM | POA: Diagnosis not present

## 2011-10-13 DIAGNOSIS — R55 Syncope and collapse: Secondary | ICD-10-CM | POA: Diagnosis not present

## 2011-10-13 DIAGNOSIS — D510 Vitamin B12 deficiency anemia due to intrinsic factor deficiency: Secondary | ICD-10-CM | POA: Diagnosis not present

## 2011-10-15 ENCOUNTER — Ambulatory Visit (INDEPENDENT_AMBULATORY_CARE_PROVIDER_SITE_OTHER): Payer: Medicare Other | Admitting: Cardiovascular Disease

## 2011-10-15 ENCOUNTER — Encounter: Payer: Self-pay | Admitting: Cardiovascular Disease

## 2011-10-15 VITALS — BP 170/76 | HR 78 | Ht 65.5 in | Wt 205.0 lb

## 2011-10-15 DIAGNOSIS — D510 Vitamin B12 deficiency anemia due to intrinsic factor deficiency: Secondary | ICD-10-CM | POA: Diagnosis not present

## 2011-10-15 DIAGNOSIS — I951 Orthostatic hypotension: Secondary | ICD-10-CM | POA: Diagnosis not present

## 2011-10-15 DIAGNOSIS — I4891 Unspecified atrial fibrillation: Secondary | ICD-10-CM

## 2011-10-15 DIAGNOSIS — E119 Type 2 diabetes mellitus without complications: Secondary | ICD-10-CM | POA: Diagnosis not present

## 2011-10-15 DIAGNOSIS — R55 Syncope and collapse: Secondary | ICD-10-CM | POA: Diagnosis not present

## 2011-10-15 DIAGNOSIS — M6281 Muscle weakness (generalized): Secondary | ICD-10-CM | POA: Diagnosis not present

## 2011-10-15 DIAGNOSIS — I10 Essential (primary) hypertension: Secondary | ICD-10-CM | POA: Diagnosis not present

## 2011-10-15 MED ORDER — LISINOPRIL 10 MG PO TABS: 10.0000 mg | ORAL_TABLET | Freq: Every day | ORAL | Status: DC

## 2011-10-15 NOTE — Assessment & Plan Note (Signed)
We have suggested she start lisinopril 10 mg daily. This can be titrated upwards as needed for blood pressure control.

## 2011-10-15 NOTE — Progress Notes (Signed)
Patient ID: Mia Padilla, female    DOB: 1934-06-25, 77 y.o.   MRN: [id number]  HPI Comments: Mia Padilla is a 77 year old woman with history of stroke, seizure disorder, hypertension, COPD, diabetes, hypothyroidism, obesity, atrial fibrillation on recent hospital admission April 04 2011 for mental status changes. During her hospital course, she had an echocardiogram showing normal LV function, carotid ultrasound showing no significant disease, head CT and MRI showing no significant stroke. She reports having a "code" called on her in the hospital.  Notes indicate possible TIA. She was started on warfarin for atrial fibrillation and presents today in normal sinus rhythm.  She reports that her energy is reasonably good. She has severe bilateral knee pain and hip pain. She denies any significant shortness of breath or chest pain. She does not carry a diagnosis of coronary artery disease. It does not appear that she has had a recent stress test. She reports having bilateral edema. She was started recently on Keflex for possible cellulitis. Coumadin was stopped secondary to unsteady gait by Dr. Dossie Arbour. She was started on Plavix 10/13/2011. She denies any recent falls. She reports that she understands the risk and benefit of warfarin. She denies having any palpitations or episodes concerning for atrial fibrillation.  Notes indicate that she has had a history of seizures, possibly with low glucose levels. Previous diagnosis of complex partial seizures and she was started on antiseizure medications. She reports that she is being weaned off her keppra. She currently has home nursing.  EKG shows normal sinus rhythm with rate 78 beats per minute, no significant ST or T wave changes   Outpatient Encounter Prescriptions as of 10/15/2011  Medication Sig Dispense Refill  . albuterol (PROVENTIL HFA;VENTOLIN HFA) 108 (90 BASE) MCG/ACT inhaler Inhale 2 puffs into the lungs every 6 (six) hours as needed.      Marland Kitchen  aspirin EC 81 MG tablet Take 81 mg by mouth daily.      . brimonidine (ALPHAGAN P) 0.1 % SOLN       . brinzolamide (AZOPT) 1 % ophthalmic suspension Apply 1 drop to eye 3 (three) times daily.      . cephALEXin (KEFLEX) 500 MG capsule Take 500 mg by mouth 4 (four) times daily. 7 days.      . clopidogrel (PLAVIX) 75 MG tablet Take 75 mg by mouth daily.      . digoxin (LANOXIN) 0.125 MG tablet Take 125 mcg by mouth daily.      . dorzolamide (TRUSOPT) 2 % ophthalmic solution 1 drop 3 (three) times daily.      . fluticasone (FLONASE) 50 MCG/ACT nasal spray Place 2 sprays into the nose daily.      . Fluticasone-Salmeterol (ADVAIR) 250-50 MCG/DOSE AEPB Inhale 1 puff into the lungs every 12 (twelve) hours.      Marland Kitchen guaiFENesin (MUCINEX) 600 MG 12 hr tablet Take 1,200 mg by mouth 2 (two) times daily.      . insulin aspart (NOVOLOG) 100 UNIT/ML injection Inject into the skin. Sliding scale      . insulin detemir (LEVEMIR) 100 UNIT/ML injection Inject 23 Units into the skin at bedtime.      Marland Kitchen levothyroxine (SYNTHROID, LEVOTHROID) 75 MCG tablet Take 75 mcg by mouth daily.      . meclizine (ANTIVERT) 25 MG tablet Take 25 mg by mouth 3 (three) times daily as needed.      . metFORMIN (GLUCOPHAGE) 500 MG tablet Take 500 mg by mouth 2 (two) times daily with  a meal.      . metoprolol succinate (TOPROL-XL) 100 MG 24 hr tablet Take 100 mg by mouth daily. And 50 mg in the PM      . simvastatin (ZOCOR) 40 MG tablet Take 40 mg by mouth every evening.      . solifenacin (VESICARE) 5 MG tablet Take 10 mg by mouth daily.      . travoprost, benzalkonium, (TRAVATAN) 0.004 % ophthalmic solution Apply 1 drop to eye at bedtime.      Marland Kitchen lisinopril (PRINIVIL,ZESTRIL) 10 MG tablet Take 1 tablet (10 mg total) by mouth daily.  30 tablet  6   Review of Systems  Constitutional: Negative.   HENT: Negative.   Eyes: Negative.   Respiratory: Negative.   Cardiovascular: Negative.   Gastrointestinal: Negative.   Musculoskeletal:  Positive for back pain, joint swelling, arthralgias and gait problem.  Skin: Negative.   Neurological: Negative.   Hematological: Negative.   Psychiatric/Behavioral: Negative.   All other systems reviewed and are negative.   BP 170/76  Pulse 78  Ht 5' 5.5" (1.664 m)  Wt 205 lb (92.987 kg)  BMI 33.59 kg/m2  Physical Exam  Nursing note and vitals reviewed. Constitutional: She is oriented to person, place, and time. She appears well-developed and well-nourished.       Obese, walks with a walker  HENT:  Head: Normocephalic.  Nose: Nose normal.  Mouth/Throat: Oropharynx is clear and moist.  Eyes: Conjunctivae are normal. Pupils are equal, round, and reactive to light.  Neck: Normal range of motion. Neck supple. No JVD present.  Cardiovascular: Normal rate, regular rhythm, S1 normal, S2 normal and intact distal pulses.  Exam reveals no gallop and no friction rub.   Murmur heard.  Crescendo systolic murmur is present with a grade of 2/6  Pulmonary/Chest: Effort normal and breath sounds normal. No respiratory distress. She has no wheezes. She has no rales. She exhibits no tenderness.  Abdominal: Soft. Bowel sounds are normal. She exhibits no distension. There is no tenderness.  Musculoskeletal: Normal range of motion. She exhibits no edema and no tenderness.  Lymphadenopathy:    She has no cervical adenopathy.  Neurological: She is alert and oriented to person, place, and time. Coordination normal.  Skin: Skin is warm and dry. No rash noted. No erythema.  Psychiatric: She has a normal mood and affect. Her behavior is normal. Judgment and thought content normal.         Assessment and Plan

## 2011-10-15 NOTE — Patient Instructions (Addendum)
You are doing well. Please start lisinopril 10 mg daily for high blood pressure  Ask the [facility] pharmacy about omeprazole one a day for stomach  Please call us if you have new issues that need to be addressed before your next appt.  Your physician wants you to follow-up in: 6 months.  You will receive a reminder letter in the mail two months in advance. If you don't receive a letter, please call our office to schedule the follow-up appointment.

## 2011-10-15 NOTE — Assessment & Plan Note (Signed)
Currently maintaining normal sinus rhythm on metoprolol

## 2011-10-15 NOTE — Assessment & Plan Note (Signed)
We have encouraged continued careful diet management in an effort to lose weight.  

## 2011-10-16 DIAGNOSIS — D510 Vitamin B12 deficiency anemia due to intrinsic factor deficiency: Secondary | ICD-10-CM | POA: Diagnosis not present

## 2011-10-16 DIAGNOSIS — M6281 Muscle weakness (generalized): Secondary | ICD-10-CM | POA: Diagnosis not present

## 2011-10-16 DIAGNOSIS — E119 Type 2 diabetes mellitus without complications: Secondary | ICD-10-CM | POA: Diagnosis not present

## 2011-10-16 DIAGNOSIS — I4891 Unspecified atrial fibrillation: Secondary | ICD-10-CM | POA: Diagnosis not present

## 2011-10-16 DIAGNOSIS — I951 Orthostatic hypotension: Secondary | ICD-10-CM | POA: Diagnosis not present

## 2011-10-16 DIAGNOSIS — R55 Syncope and collapse: Secondary | ICD-10-CM | POA: Diagnosis not present

## 2011-10-20 DIAGNOSIS — I4891 Unspecified atrial fibrillation: Secondary | ICD-10-CM | POA: Diagnosis not present

## 2011-10-20 DIAGNOSIS — E119 Type 2 diabetes mellitus without complications: Secondary | ICD-10-CM | POA: Diagnosis not present

## 2011-10-20 DIAGNOSIS — D510 Vitamin B12 deficiency anemia due to intrinsic factor deficiency: Secondary | ICD-10-CM | POA: Diagnosis not present

## 2011-10-20 DIAGNOSIS — I951 Orthostatic hypotension: Secondary | ICD-10-CM | POA: Diagnosis not present

## 2011-10-20 DIAGNOSIS — M6281 Muscle weakness (generalized): Secondary | ICD-10-CM | POA: Diagnosis not present

## 2011-10-20 DIAGNOSIS — R55 Syncope and collapse: Secondary | ICD-10-CM | POA: Diagnosis not present

## 2011-10-23 DIAGNOSIS — M6281 Muscle weakness (generalized): Secondary | ICD-10-CM | POA: Diagnosis not present

## 2011-10-23 DIAGNOSIS — I951 Orthostatic hypotension: Secondary | ICD-10-CM | POA: Diagnosis not present

## 2011-10-23 DIAGNOSIS — D510 Vitamin B12 deficiency anemia due to intrinsic factor deficiency: Secondary | ICD-10-CM | POA: Diagnosis not present

## 2011-10-23 DIAGNOSIS — R55 Syncope and collapse: Secondary | ICD-10-CM | POA: Diagnosis not present

## 2011-10-23 DIAGNOSIS — E119 Type 2 diabetes mellitus without complications: Secondary | ICD-10-CM | POA: Diagnosis not present

## 2011-10-23 DIAGNOSIS — I4891 Unspecified atrial fibrillation: Secondary | ICD-10-CM | POA: Diagnosis not present

## 2011-10-26 DIAGNOSIS — E119 Type 2 diabetes mellitus without complications: Secondary | ICD-10-CM | POA: Diagnosis not present

## 2011-10-26 DIAGNOSIS — I4891 Unspecified atrial fibrillation: Secondary | ICD-10-CM | POA: Diagnosis not present

## 2011-10-26 DIAGNOSIS — R55 Syncope and collapse: Secondary | ICD-10-CM | POA: Diagnosis not present

## 2011-10-26 DIAGNOSIS — D510 Vitamin B12 deficiency anemia due to intrinsic factor deficiency: Secondary | ICD-10-CM | POA: Diagnosis not present

## 2011-10-26 DIAGNOSIS — M6281 Muscle weakness (generalized): Secondary | ICD-10-CM | POA: Diagnosis not present

## 2011-10-26 DIAGNOSIS — I951 Orthostatic hypotension: Secondary | ICD-10-CM | POA: Diagnosis not present

## 2011-10-28 DIAGNOSIS — I951 Orthostatic hypotension: Secondary | ICD-10-CM | POA: Diagnosis not present

## 2011-10-28 DIAGNOSIS — D510 Vitamin B12 deficiency anemia due to intrinsic factor deficiency: Secondary | ICD-10-CM | POA: Diagnosis not present

## 2011-10-28 DIAGNOSIS — I4891 Unspecified atrial fibrillation: Secondary | ICD-10-CM | POA: Diagnosis not present

## 2011-10-28 DIAGNOSIS — E119 Type 2 diabetes mellitus without complications: Secondary | ICD-10-CM | POA: Diagnosis not present

## 2011-10-28 DIAGNOSIS — M6281 Muscle weakness (generalized): Secondary | ICD-10-CM | POA: Diagnosis not present

## 2011-10-28 DIAGNOSIS — R55 Syncope and collapse: Secondary | ICD-10-CM | POA: Diagnosis not present

## 2011-10-30 DIAGNOSIS — M6281 Muscle weakness (generalized): Secondary | ICD-10-CM | POA: Diagnosis not present

## 2011-10-30 DIAGNOSIS — R55 Syncope and collapse: Secondary | ICD-10-CM | POA: Diagnosis not present

## 2011-10-30 DIAGNOSIS — E119 Type 2 diabetes mellitus without complications: Secondary | ICD-10-CM | POA: Diagnosis not present

## 2011-10-30 DIAGNOSIS — I4891 Unspecified atrial fibrillation: Secondary | ICD-10-CM | POA: Diagnosis not present

## 2011-10-30 DIAGNOSIS — I951 Orthostatic hypotension: Secondary | ICD-10-CM | POA: Diagnosis not present

## 2011-10-30 DIAGNOSIS — D510 Vitamin B12 deficiency anemia due to intrinsic factor deficiency: Secondary | ICD-10-CM | POA: Diagnosis not present

## 2011-11-02 DIAGNOSIS — I4891 Unspecified atrial fibrillation: Secondary | ICD-10-CM | POA: Diagnosis not present

## 2011-11-02 DIAGNOSIS — E119 Type 2 diabetes mellitus without complications: Secondary | ICD-10-CM | POA: Diagnosis not present

## 2011-11-02 DIAGNOSIS — D510 Vitamin B12 deficiency anemia due to intrinsic factor deficiency: Secondary | ICD-10-CM | POA: Diagnosis not present

## 2011-11-02 DIAGNOSIS — M6281 Muscle weakness (generalized): Secondary | ICD-10-CM | POA: Diagnosis not present

## 2011-11-02 DIAGNOSIS — R55 Syncope and collapse: Secondary | ICD-10-CM | POA: Diagnosis not present

## 2011-11-02 DIAGNOSIS — I951 Orthostatic hypotension: Secondary | ICD-10-CM | POA: Diagnosis not present

## 2011-11-05 DIAGNOSIS — D510 Vitamin B12 deficiency anemia due to intrinsic factor deficiency: Secondary | ICD-10-CM | POA: Diagnosis not present

## 2011-11-05 DIAGNOSIS — M6281 Muscle weakness (generalized): Secondary | ICD-10-CM | POA: Diagnosis not present

## 2011-11-05 DIAGNOSIS — I4891 Unspecified atrial fibrillation: Secondary | ICD-10-CM | POA: Diagnosis not present

## 2011-11-05 DIAGNOSIS — E119 Type 2 diabetes mellitus without complications: Secondary | ICD-10-CM | POA: Diagnosis not present

## 2011-11-05 DIAGNOSIS — I951 Orthostatic hypotension: Secondary | ICD-10-CM | POA: Diagnosis not present

## 2011-11-05 DIAGNOSIS — R55 Syncope and collapse: Secondary | ICD-10-CM | POA: Diagnosis not present

## 2011-11-06 DIAGNOSIS — I4891 Unspecified atrial fibrillation: Secondary | ICD-10-CM | POA: Diagnosis not present

## 2011-11-06 DIAGNOSIS — E119 Type 2 diabetes mellitus without complications: Secondary | ICD-10-CM | POA: Diagnosis not present

## 2011-11-06 DIAGNOSIS — D510 Vitamin B12 deficiency anemia due to intrinsic factor deficiency: Secondary | ICD-10-CM | POA: Diagnosis not present

## 2011-11-06 DIAGNOSIS — M6281 Muscle weakness (generalized): Secondary | ICD-10-CM | POA: Diagnosis not present

## 2011-11-06 DIAGNOSIS — I951 Orthostatic hypotension: Secondary | ICD-10-CM | POA: Diagnosis not present

## 2011-11-06 DIAGNOSIS — R55 Syncope and collapse: Secondary | ICD-10-CM | POA: Diagnosis not present

## 2011-11-09 DIAGNOSIS — R55 Syncope and collapse: Secondary | ICD-10-CM | POA: Diagnosis not present

## 2011-11-09 DIAGNOSIS — D510 Vitamin B12 deficiency anemia due to intrinsic factor deficiency: Secondary | ICD-10-CM | POA: Diagnosis not present

## 2011-11-09 DIAGNOSIS — I4891 Unspecified atrial fibrillation: Secondary | ICD-10-CM | POA: Diagnosis not present

## 2011-11-09 DIAGNOSIS — M6281 Muscle weakness (generalized): Secondary | ICD-10-CM | POA: Diagnosis not present

## 2011-11-09 DIAGNOSIS — I951 Orthostatic hypotension: Secondary | ICD-10-CM | POA: Diagnosis not present

## 2011-11-09 DIAGNOSIS — E119 Type 2 diabetes mellitus without complications: Secondary | ICD-10-CM | POA: Diagnosis not present

## 2011-11-10 DIAGNOSIS — I951 Orthostatic hypotension: Secondary | ICD-10-CM | POA: Diagnosis not present

## 2011-11-10 DIAGNOSIS — R55 Syncope and collapse: Secondary | ICD-10-CM | POA: Diagnosis not present

## 2011-11-10 DIAGNOSIS — M6281 Muscle weakness (generalized): Secondary | ICD-10-CM | POA: Diagnosis not present

## 2011-11-10 DIAGNOSIS — I4891 Unspecified atrial fibrillation: Secondary | ICD-10-CM | POA: Diagnosis not present

## 2011-11-10 DIAGNOSIS — E119 Type 2 diabetes mellitus without complications: Secondary | ICD-10-CM | POA: Diagnosis not present

## 2011-11-10 DIAGNOSIS — D510 Vitamin B12 deficiency anemia due to intrinsic factor deficiency: Secondary | ICD-10-CM | POA: Diagnosis not present

## 2011-11-12 DIAGNOSIS — I951 Orthostatic hypotension: Secondary | ICD-10-CM | POA: Diagnosis not present

## 2011-11-12 DIAGNOSIS — D510 Vitamin B12 deficiency anemia due to intrinsic factor deficiency: Secondary | ICD-10-CM | POA: Diagnosis not present

## 2011-11-12 DIAGNOSIS — H269 Unspecified cataract: Secondary | ICD-10-CM | POA: Diagnosis not present

## 2011-11-12 DIAGNOSIS — M6281 Muscle weakness (generalized): Secondary | ICD-10-CM | POA: Diagnosis not present

## 2011-11-12 DIAGNOSIS — R55 Syncope and collapse: Secondary | ICD-10-CM | POA: Diagnosis not present

## 2011-11-12 DIAGNOSIS — E119 Type 2 diabetes mellitus without complications: Secondary | ICD-10-CM | POA: Diagnosis not present

## 2011-11-12 DIAGNOSIS — I4891 Unspecified atrial fibrillation: Secondary | ICD-10-CM | POA: Diagnosis not present

## 2011-11-12 DIAGNOSIS — H409 Unspecified glaucoma: Secondary | ICD-10-CM | POA: Diagnosis not present

## 2011-11-12 DIAGNOSIS — H4011X0 Primary open-angle glaucoma, stage unspecified: Secondary | ICD-10-CM | POA: Diagnosis not present

## 2011-11-13 DIAGNOSIS — M6281 Muscle weakness (generalized): Secondary | ICD-10-CM | POA: Diagnosis not present

## 2011-11-13 DIAGNOSIS — R55 Syncope and collapse: Secondary | ICD-10-CM | POA: Diagnosis not present

## 2011-11-13 DIAGNOSIS — I4891 Unspecified atrial fibrillation: Secondary | ICD-10-CM | POA: Diagnosis not present

## 2011-11-13 DIAGNOSIS — E119 Type 2 diabetes mellitus without complications: Secondary | ICD-10-CM | POA: Diagnosis not present

## 2011-11-13 DIAGNOSIS — D510 Vitamin B12 deficiency anemia due to intrinsic factor deficiency: Secondary | ICD-10-CM | POA: Diagnosis not present

## 2011-11-13 DIAGNOSIS — I951 Orthostatic hypotension: Secondary | ICD-10-CM | POA: Diagnosis not present

## 2011-11-16 DIAGNOSIS — M6281 Muscle weakness (generalized): Secondary | ICD-10-CM | POA: Diagnosis not present

## 2011-11-16 DIAGNOSIS — I4891 Unspecified atrial fibrillation: Secondary | ICD-10-CM | POA: Diagnosis not present

## 2011-11-16 DIAGNOSIS — I951 Orthostatic hypotension: Secondary | ICD-10-CM | POA: Diagnosis not present

## 2011-11-16 DIAGNOSIS — R55 Syncope and collapse: Secondary | ICD-10-CM | POA: Diagnosis not present

## 2011-11-16 DIAGNOSIS — D510 Vitamin B12 deficiency anemia due to intrinsic factor deficiency: Secondary | ICD-10-CM | POA: Diagnosis not present

## 2011-11-16 DIAGNOSIS — E119 Type 2 diabetes mellitus without complications: Secondary | ICD-10-CM | POA: Diagnosis not present

## 2011-11-17 DIAGNOSIS — R55 Syncope and collapse: Secondary | ICD-10-CM | POA: Diagnosis not present

## 2011-11-17 DIAGNOSIS — I4891 Unspecified atrial fibrillation: Secondary | ICD-10-CM | POA: Diagnosis not present

## 2011-11-17 DIAGNOSIS — E119 Type 2 diabetes mellitus without complications: Secondary | ICD-10-CM | POA: Diagnosis not present

## 2011-11-17 DIAGNOSIS — M6281 Muscle weakness (generalized): Secondary | ICD-10-CM | POA: Diagnosis not present

## 2011-11-17 DIAGNOSIS — I951 Orthostatic hypotension: Secondary | ICD-10-CM | POA: Diagnosis not present

## 2011-11-17 DIAGNOSIS — D510 Vitamin B12 deficiency anemia due to intrinsic factor deficiency: Secondary | ICD-10-CM | POA: Diagnosis not present

## 2011-11-18 DIAGNOSIS — I951 Orthostatic hypotension: Secondary | ICD-10-CM | POA: Diagnosis not present

## 2011-11-18 DIAGNOSIS — R55 Syncope and collapse: Secondary | ICD-10-CM | POA: Diagnosis not present

## 2011-11-18 DIAGNOSIS — I4891 Unspecified atrial fibrillation: Secondary | ICD-10-CM | POA: Diagnosis not present

## 2011-11-18 DIAGNOSIS — E119 Type 2 diabetes mellitus without complications: Secondary | ICD-10-CM | POA: Diagnosis not present

## 2011-11-18 DIAGNOSIS — D510 Vitamin B12 deficiency anemia due to intrinsic factor deficiency: Secondary | ICD-10-CM | POA: Diagnosis not present

## 2011-11-18 DIAGNOSIS — M6281 Muscle weakness (generalized): Secondary | ICD-10-CM | POA: Diagnosis not present

## 2011-11-19 DIAGNOSIS — M6281 Muscle weakness (generalized): Secondary | ICD-10-CM | POA: Diagnosis not present

## 2011-11-19 DIAGNOSIS — I4891 Unspecified atrial fibrillation: Secondary | ICD-10-CM | POA: Diagnosis not present

## 2011-11-19 DIAGNOSIS — E119 Type 2 diabetes mellitus without complications: Secondary | ICD-10-CM | POA: Diagnosis not present

## 2011-11-19 DIAGNOSIS — R55 Syncope and collapse: Secondary | ICD-10-CM | POA: Diagnosis not present

## 2011-11-19 DIAGNOSIS — I951 Orthostatic hypotension: Secondary | ICD-10-CM | POA: Diagnosis not present

## 2011-11-19 DIAGNOSIS — D510 Vitamin B12 deficiency anemia due to intrinsic factor deficiency: Secondary | ICD-10-CM | POA: Diagnosis not present

## 2011-11-20 DIAGNOSIS — M6281 Muscle weakness (generalized): Secondary | ICD-10-CM | POA: Diagnosis not present

## 2011-11-20 DIAGNOSIS — E119 Type 2 diabetes mellitus without complications: Secondary | ICD-10-CM | POA: Diagnosis not present

## 2011-11-20 DIAGNOSIS — D510 Vitamin B12 deficiency anemia due to intrinsic factor deficiency: Secondary | ICD-10-CM | POA: Diagnosis not present

## 2011-11-20 DIAGNOSIS — R55 Syncope and collapse: Secondary | ICD-10-CM | POA: Diagnosis not present

## 2011-11-20 DIAGNOSIS — I4891 Unspecified atrial fibrillation: Secondary | ICD-10-CM | POA: Diagnosis not present

## 2011-11-20 DIAGNOSIS — I951 Orthostatic hypotension: Secondary | ICD-10-CM | POA: Diagnosis not present

## 2011-11-23 DIAGNOSIS — R55 Syncope and collapse: Secondary | ICD-10-CM | POA: Diagnosis not present

## 2011-11-23 DIAGNOSIS — E119 Type 2 diabetes mellitus without complications: Secondary | ICD-10-CM | POA: Diagnosis not present

## 2011-11-23 DIAGNOSIS — D510 Vitamin B12 deficiency anemia due to intrinsic factor deficiency: Secondary | ICD-10-CM | POA: Diagnosis not present

## 2011-11-23 DIAGNOSIS — I4891 Unspecified atrial fibrillation: Secondary | ICD-10-CM | POA: Diagnosis not present

## 2011-11-23 DIAGNOSIS — M6281 Muscle weakness (generalized): Secondary | ICD-10-CM | POA: Diagnosis not present

## 2011-11-23 DIAGNOSIS — I951 Orthostatic hypotension: Secondary | ICD-10-CM | POA: Diagnosis not present

## 2011-11-24 DIAGNOSIS — I951 Orthostatic hypotension: Secondary | ICD-10-CM | POA: Diagnosis not present

## 2011-11-24 DIAGNOSIS — I4891 Unspecified atrial fibrillation: Secondary | ICD-10-CM | POA: Diagnosis not present

## 2011-11-24 DIAGNOSIS — E7800 Pure hypercholesterolemia, unspecified: Secondary | ICD-10-CM | POA: Diagnosis not present

## 2011-11-24 DIAGNOSIS — R55 Syncope and collapse: Secondary | ICD-10-CM | POA: Diagnosis not present

## 2011-11-24 DIAGNOSIS — I10 Essential (primary) hypertension: Secondary | ICD-10-CM | POA: Diagnosis not present

## 2011-11-24 DIAGNOSIS — E785 Hyperlipidemia, unspecified: Secondary | ICD-10-CM | POA: Diagnosis not present

## 2011-11-24 DIAGNOSIS — E119 Type 2 diabetes mellitus without complications: Secondary | ICD-10-CM | POA: Diagnosis not present

## 2011-11-24 DIAGNOSIS — E039 Hypothyroidism, unspecified: Secondary | ICD-10-CM | POA: Diagnosis not present

## 2011-11-24 DIAGNOSIS — D510 Vitamin B12 deficiency anemia due to intrinsic factor deficiency: Secondary | ICD-10-CM | POA: Diagnosis not present

## 2011-11-24 DIAGNOSIS — M6281 Muscle weakness (generalized): Secondary | ICD-10-CM | POA: Diagnosis not present

## 2011-11-25 DIAGNOSIS — D510 Vitamin B12 deficiency anemia due to intrinsic factor deficiency: Secondary | ICD-10-CM | POA: Diagnosis not present

## 2011-11-25 DIAGNOSIS — I951 Orthostatic hypotension: Secondary | ICD-10-CM | POA: Diagnosis not present

## 2011-11-25 DIAGNOSIS — I4891 Unspecified atrial fibrillation: Secondary | ICD-10-CM | POA: Diagnosis not present

## 2011-11-25 DIAGNOSIS — E119 Type 2 diabetes mellitus without complications: Secondary | ICD-10-CM | POA: Diagnosis not present

## 2011-11-25 DIAGNOSIS — M6281 Muscle weakness (generalized): Secondary | ICD-10-CM | POA: Diagnosis not present

## 2011-11-25 DIAGNOSIS — R55 Syncope and collapse: Secondary | ICD-10-CM | POA: Diagnosis not present

## 2011-11-27 DIAGNOSIS — I951 Orthostatic hypotension: Secondary | ICD-10-CM | POA: Diagnosis not present

## 2011-11-27 DIAGNOSIS — I4891 Unspecified atrial fibrillation: Secondary | ICD-10-CM | POA: Diagnosis not present

## 2011-11-27 DIAGNOSIS — E119 Type 2 diabetes mellitus without complications: Secondary | ICD-10-CM | POA: Diagnosis not present

## 2011-11-27 DIAGNOSIS — M6281 Muscle weakness (generalized): Secondary | ICD-10-CM | POA: Diagnosis not present

## 2011-11-27 DIAGNOSIS — D510 Vitamin B12 deficiency anemia due to intrinsic factor deficiency: Secondary | ICD-10-CM | POA: Diagnosis not present

## 2011-11-27 DIAGNOSIS — R55 Syncope and collapse: Secondary | ICD-10-CM | POA: Diagnosis not present

## 2011-11-28 DIAGNOSIS — R55 Syncope and collapse: Secondary | ICD-10-CM | POA: Diagnosis not present

## 2011-11-28 DIAGNOSIS — I951 Orthostatic hypotension: Secondary | ICD-10-CM | POA: Diagnosis not present

## 2011-11-28 DIAGNOSIS — D510 Vitamin B12 deficiency anemia due to intrinsic factor deficiency: Secondary | ICD-10-CM | POA: Diagnosis not present

## 2011-11-28 DIAGNOSIS — I4891 Unspecified atrial fibrillation: Secondary | ICD-10-CM | POA: Diagnosis not present

## 2011-11-28 DIAGNOSIS — E119 Type 2 diabetes mellitus without complications: Secondary | ICD-10-CM | POA: Diagnosis not present

## 2011-11-28 DIAGNOSIS — M6281 Muscle weakness (generalized): Secondary | ICD-10-CM | POA: Diagnosis not present

## 2011-11-30 DIAGNOSIS — R55 Syncope and collapse: Secondary | ICD-10-CM | POA: Diagnosis not present

## 2011-11-30 DIAGNOSIS — I951 Orthostatic hypotension: Secondary | ICD-10-CM | POA: Diagnosis not present

## 2011-11-30 DIAGNOSIS — E119 Type 2 diabetes mellitus without complications: Secondary | ICD-10-CM | POA: Diagnosis not present

## 2011-11-30 DIAGNOSIS — M6281 Muscle weakness (generalized): Secondary | ICD-10-CM | POA: Diagnosis not present

## 2011-11-30 DIAGNOSIS — D510 Vitamin B12 deficiency anemia due to intrinsic factor deficiency: Secondary | ICD-10-CM | POA: Diagnosis not present

## 2011-11-30 DIAGNOSIS — I4891 Unspecified atrial fibrillation: Secondary | ICD-10-CM | POA: Diagnosis not present

## 2011-12-02 DIAGNOSIS — I951 Orthostatic hypotension: Secondary | ICD-10-CM | POA: Diagnosis not present

## 2011-12-02 DIAGNOSIS — M6281 Muscle weakness (generalized): Secondary | ICD-10-CM | POA: Diagnosis not present

## 2011-12-02 DIAGNOSIS — R55 Syncope and collapse: Secondary | ICD-10-CM | POA: Diagnosis not present

## 2011-12-02 DIAGNOSIS — I4891 Unspecified atrial fibrillation: Secondary | ICD-10-CM | POA: Diagnosis not present

## 2011-12-02 DIAGNOSIS — E119 Type 2 diabetes mellitus without complications: Secondary | ICD-10-CM | POA: Diagnosis not present

## 2011-12-02 DIAGNOSIS — D510 Vitamin B12 deficiency anemia due to intrinsic factor deficiency: Secondary | ICD-10-CM | POA: Diagnosis not present

## 2011-12-04 DIAGNOSIS — I4891 Unspecified atrial fibrillation: Secondary | ICD-10-CM | POA: Diagnosis not present

## 2011-12-04 DIAGNOSIS — M6281 Muscle weakness (generalized): Secondary | ICD-10-CM | POA: Diagnosis not present

## 2011-12-04 DIAGNOSIS — D510 Vitamin B12 deficiency anemia due to intrinsic factor deficiency: Secondary | ICD-10-CM | POA: Diagnosis not present

## 2011-12-04 DIAGNOSIS — I951 Orthostatic hypotension: Secondary | ICD-10-CM | POA: Diagnosis not present

## 2011-12-04 DIAGNOSIS — R55 Syncope and collapse: Secondary | ICD-10-CM | POA: Diagnosis not present

## 2011-12-04 DIAGNOSIS — E119 Type 2 diabetes mellitus without complications: Secondary | ICD-10-CM | POA: Diagnosis not present

## 2011-12-07 DIAGNOSIS — D510 Vitamin B12 deficiency anemia due to intrinsic factor deficiency: Secondary | ICD-10-CM | POA: Diagnosis not present

## 2011-12-07 DIAGNOSIS — I951 Orthostatic hypotension: Secondary | ICD-10-CM | POA: Diagnosis not present

## 2011-12-07 DIAGNOSIS — E119 Type 2 diabetes mellitus without complications: Secondary | ICD-10-CM | POA: Diagnosis not present

## 2011-12-07 DIAGNOSIS — I4891 Unspecified atrial fibrillation: Secondary | ICD-10-CM | POA: Diagnosis not present

## 2011-12-07 DIAGNOSIS — M6281 Muscle weakness (generalized): Secondary | ICD-10-CM | POA: Diagnosis not present

## 2011-12-07 DIAGNOSIS — R55 Syncope and collapse: Secondary | ICD-10-CM | POA: Diagnosis not present

## 2011-12-09 DIAGNOSIS — I4891 Unspecified atrial fibrillation: Secondary | ICD-10-CM | POA: Diagnosis not present

## 2011-12-09 DIAGNOSIS — E119 Type 2 diabetes mellitus without complications: Secondary | ICD-10-CM | POA: Diagnosis not present

## 2011-12-09 DIAGNOSIS — M6281 Muscle weakness (generalized): Secondary | ICD-10-CM | POA: Diagnosis not present

## 2011-12-09 DIAGNOSIS — R55 Syncope and collapse: Secondary | ICD-10-CM | POA: Diagnosis not present

## 2011-12-09 DIAGNOSIS — D510 Vitamin B12 deficiency anemia due to intrinsic factor deficiency: Secondary | ICD-10-CM | POA: Diagnosis not present

## 2011-12-09 DIAGNOSIS — I951 Orthostatic hypotension: Secondary | ICD-10-CM | POA: Diagnosis not present

## 2011-12-10 DIAGNOSIS — D510 Vitamin B12 deficiency anemia due to intrinsic factor deficiency: Secondary | ICD-10-CM | POA: Diagnosis not present

## 2011-12-10 DIAGNOSIS — R55 Syncope and collapse: Secondary | ICD-10-CM | POA: Diagnosis not present

## 2011-12-10 DIAGNOSIS — M6281 Muscle weakness (generalized): Secondary | ICD-10-CM | POA: Diagnosis not present

## 2011-12-10 DIAGNOSIS — E119 Type 2 diabetes mellitus without complications: Secondary | ICD-10-CM | POA: Diagnosis not present

## 2011-12-10 DIAGNOSIS — I4891 Unspecified atrial fibrillation: Secondary | ICD-10-CM | POA: Diagnosis not present

## 2011-12-10 DIAGNOSIS — I951 Orthostatic hypotension: Secondary | ICD-10-CM | POA: Diagnosis not present

## 2011-12-11 DIAGNOSIS — D510 Vitamin B12 deficiency anemia due to intrinsic factor deficiency: Secondary | ICD-10-CM | POA: Diagnosis not present

## 2011-12-11 DIAGNOSIS — R55 Syncope and collapse: Secondary | ICD-10-CM | POA: Diagnosis not present

## 2011-12-11 DIAGNOSIS — E119 Type 2 diabetes mellitus without complications: Secondary | ICD-10-CM | POA: Diagnosis not present

## 2011-12-11 DIAGNOSIS — I951 Orthostatic hypotension: Secondary | ICD-10-CM | POA: Diagnosis not present

## 2011-12-11 DIAGNOSIS — I4891 Unspecified atrial fibrillation: Secondary | ICD-10-CM | POA: Diagnosis not present

## 2011-12-11 DIAGNOSIS — M6281 Muscle weakness (generalized): Secondary | ICD-10-CM | POA: Diagnosis not present

## 2011-12-15 DIAGNOSIS — I739 Peripheral vascular disease, unspecified: Secondary | ICD-10-CM | POA: Diagnosis not present

## 2011-12-16 ENCOUNTER — Encounter: Payer: Self-pay | Admitting: Cardiothoracic Surgery

## 2011-12-16 ENCOUNTER — Encounter: Payer: Self-pay | Admitting: Nurse Practitioner

## 2011-12-16 DIAGNOSIS — M810 Age-related osteoporosis without current pathological fracture: Secondary | ICD-10-CM | POA: Diagnosis not present

## 2011-12-16 DIAGNOSIS — J449 Chronic obstructive pulmonary disease, unspecified: Secondary | ICD-10-CM | POA: Diagnosis not present

## 2011-12-16 DIAGNOSIS — I499 Cardiac arrhythmia, unspecified: Secondary | ICD-10-CM | POA: Diagnosis not present

## 2011-12-16 DIAGNOSIS — R55 Syncope and collapse: Secondary | ICD-10-CM | POA: Diagnosis not present

## 2011-12-16 DIAGNOSIS — I10 Essential (primary) hypertension: Secondary | ICD-10-CM | POA: Diagnosis not present

## 2011-12-16 DIAGNOSIS — L97809 Non-pressure chronic ulcer of other part of unspecified lower leg with unspecified severity: Secondary | ICD-10-CM | POA: Diagnosis not present

## 2011-12-16 DIAGNOSIS — D510 Vitamin B12 deficiency anemia due to intrinsic factor deficiency: Secondary | ICD-10-CM | POA: Diagnosis not present

## 2011-12-16 DIAGNOSIS — R609 Edema, unspecified: Secondary | ICD-10-CM | POA: Diagnosis not present

## 2011-12-16 DIAGNOSIS — I4891 Unspecified atrial fibrillation: Secondary | ICD-10-CM | POA: Diagnosis not present

## 2011-12-16 DIAGNOSIS — R159 Full incontinence of feces: Secondary | ICD-10-CM | POA: Diagnosis not present

## 2011-12-16 DIAGNOSIS — I951 Orthostatic hypotension: Secondary | ICD-10-CM | POA: Diagnosis not present

## 2011-12-16 DIAGNOSIS — R32 Unspecified urinary incontinence: Secondary | ICD-10-CM | POA: Diagnosis not present

## 2011-12-16 DIAGNOSIS — E119 Type 2 diabetes mellitus without complications: Secondary | ICD-10-CM | POA: Diagnosis not present

## 2011-12-16 DIAGNOSIS — M6281 Muscle weakness (generalized): Secondary | ICD-10-CM | POA: Diagnosis not present

## 2011-12-16 DIAGNOSIS — I87339 Chronic venous hypertension (idiopathic) with ulcer and inflammation of unspecified lower extremity: Secondary | ICD-10-CM | POA: Diagnosis not present

## 2011-12-17 DIAGNOSIS — R55 Syncope and collapse: Secondary | ICD-10-CM | POA: Diagnosis not present

## 2011-12-17 DIAGNOSIS — I4891 Unspecified atrial fibrillation: Secondary | ICD-10-CM | POA: Diagnosis not present

## 2011-12-17 DIAGNOSIS — E119 Type 2 diabetes mellitus without complications: Secondary | ICD-10-CM | POA: Diagnosis not present

## 2011-12-17 DIAGNOSIS — I951 Orthostatic hypotension: Secondary | ICD-10-CM | POA: Diagnosis not present

## 2011-12-17 DIAGNOSIS — D510 Vitamin B12 deficiency anemia due to intrinsic factor deficiency: Secondary | ICD-10-CM | POA: Diagnosis not present

## 2011-12-17 DIAGNOSIS — M6281 Muscle weakness (generalized): Secondary | ICD-10-CM | POA: Diagnosis not present

## 2011-12-18 DIAGNOSIS — R55 Syncope and collapse: Secondary | ICD-10-CM | POA: Diagnosis not present

## 2011-12-18 DIAGNOSIS — I951 Orthostatic hypotension: Secondary | ICD-10-CM | POA: Diagnosis not present

## 2011-12-18 DIAGNOSIS — M6281 Muscle weakness (generalized): Secondary | ICD-10-CM | POA: Diagnosis not present

## 2011-12-18 DIAGNOSIS — I4891 Unspecified atrial fibrillation: Secondary | ICD-10-CM | POA: Diagnosis not present

## 2011-12-18 DIAGNOSIS — D510 Vitamin B12 deficiency anemia due to intrinsic factor deficiency: Secondary | ICD-10-CM | POA: Diagnosis not present

## 2011-12-18 DIAGNOSIS — E119 Type 2 diabetes mellitus without complications: Secondary | ICD-10-CM | POA: Diagnosis not present

## 2011-12-20 ENCOUNTER — Encounter: Payer: Self-pay | Admitting: Nurse Practitioner

## 2011-12-20 ENCOUNTER — Encounter: Payer: Self-pay | Admitting: Cardiothoracic Surgery

## 2011-12-20 DIAGNOSIS — I10 Essential (primary) hypertension: Secondary | ICD-10-CM | POA: Diagnosis not present

## 2011-12-20 DIAGNOSIS — R32 Unspecified urinary incontinence: Secondary | ICD-10-CM | POA: Diagnosis not present

## 2011-12-20 DIAGNOSIS — R609 Edema, unspecified: Secondary | ICD-10-CM | POA: Diagnosis not present

## 2011-12-20 DIAGNOSIS — R159 Full incontinence of feces: Secondary | ICD-10-CM | POA: Diagnosis not present

## 2011-12-20 DIAGNOSIS — J449 Chronic obstructive pulmonary disease, unspecified: Secondary | ICD-10-CM | POA: Diagnosis not present

## 2011-12-20 DIAGNOSIS — I499 Cardiac arrhythmia, unspecified: Secondary | ICD-10-CM | POA: Diagnosis not present

## 2011-12-20 DIAGNOSIS — M810 Age-related osteoporosis without current pathological fracture: Secondary | ICD-10-CM | POA: Diagnosis not present

## 2011-12-20 DIAGNOSIS — E119 Type 2 diabetes mellitus without complications: Secondary | ICD-10-CM | POA: Diagnosis not present

## 2011-12-20 DIAGNOSIS — L97809 Non-pressure chronic ulcer of other part of unspecified lower leg with unspecified severity: Secondary | ICD-10-CM | POA: Diagnosis not present

## 2011-12-20 DIAGNOSIS — I87339 Chronic venous hypertension (idiopathic) with ulcer and inflammation of unspecified lower extremity: Secondary | ICD-10-CM | POA: Diagnosis not present

## 2011-12-21 DIAGNOSIS — I4891 Unspecified atrial fibrillation: Secondary | ICD-10-CM | POA: Diagnosis not present

## 2011-12-21 DIAGNOSIS — R55 Syncope and collapse: Secondary | ICD-10-CM | POA: Diagnosis not present

## 2011-12-21 DIAGNOSIS — M6281 Muscle weakness (generalized): Secondary | ICD-10-CM | POA: Diagnosis not present

## 2011-12-21 DIAGNOSIS — E119 Type 2 diabetes mellitus without complications: Secondary | ICD-10-CM | POA: Diagnosis not present

## 2011-12-21 DIAGNOSIS — D510 Vitamin B12 deficiency anemia due to intrinsic factor deficiency: Secondary | ICD-10-CM | POA: Diagnosis not present

## 2011-12-21 DIAGNOSIS — I951 Orthostatic hypotension: Secondary | ICD-10-CM | POA: Diagnosis not present

## 2011-12-22 DIAGNOSIS — I4891 Unspecified atrial fibrillation: Secondary | ICD-10-CM | POA: Diagnosis not present

## 2011-12-22 DIAGNOSIS — I951 Orthostatic hypotension: Secondary | ICD-10-CM | POA: Diagnosis not present

## 2011-12-22 DIAGNOSIS — D510 Vitamin B12 deficiency anemia due to intrinsic factor deficiency: Secondary | ICD-10-CM | POA: Diagnosis not present

## 2011-12-22 DIAGNOSIS — R55 Syncope and collapse: Secondary | ICD-10-CM | POA: Diagnosis not present

## 2011-12-22 DIAGNOSIS — M6281 Muscle weakness (generalized): Secondary | ICD-10-CM | POA: Diagnosis not present

## 2011-12-22 DIAGNOSIS — E119 Type 2 diabetes mellitus without complications: Secondary | ICD-10-CM | POA: Diagnosis not present

## 2011-12-23 DIAGNOSIS — I499 Cardiac arrhythmia, unspecified: Secondary | ICD-10-CM | POA: Diagnosis not present

## 2011-12-23 DIAGNOSIS — M6281 Muscle weakness (generalized): Secondary | ICD-10-CM | POA: Diagnosis not present

## 2011-12-23 DIAGNOSIS — R55 Syncope and collapse: Secondary | ICD-10-CM | POA: Diagnosis not present

## 2011-12-23 DIAGNOSIS — I4891 Unspecified atrial fibrillation: Secondary | ICD-10-CM | POA: Diagnosis not present

## 2011-12-23 DIAGNOSIS — M810 Age-related osteoporosis without current pathological fracture: Secondary | ICD-10-CM | POA: Diagnosis not present

## 2011-12-23 DIAGNOSIS — L97809 Non-pressure chronic ulcer of other part of unspecified lower leg with unspecified severity: Secondary | ICD-10-CM | POA: Diagnosis not present

## 2011-12-23 DIAGNOSIS — E119 Type 2 diabetes mellitus without complications: Secondary | ICD-10-CM | POA: Diagnosis not present

## 2011-12-23 DIAGNOSIS — I951 Orthostatic hypotension: Secondary | ICD-10-CM | POA: Diagnosis not present

## 2011-12-23 DIAGNOSIS — I10 Essential (primary) hypertension: Secondary | ICD-10-CM | POA: Diagnosis not present

## 2011-12-23 DIAGNOSIS — I87339 Chronic venous hypertension (idiopathic) with ulcer and inflammation of unspecified lower extremity: Secondary | ICD-10-CM | POA: Diagnosis not present

## 2011-12-23 DIAGNOSIS — D510 Vitamin B12 deficiency anemia due to intrinsic factor deficiency: Secondary | ICD-10-CM | POA: Diagnosis not present

## 2011-12-23 DIAGNOSIS — R609 Edema, unspecified: Secondary | ICD-10-CM | POA: Diagnosis not present

## 2011-12-23 DIAGNOSIS — L97909 Non-pressure chronic ulcer of unspecified part of unspecified lower leg with unspecified severity: Secondary | ICD-10-CM | POA: Diagnosis not present

## 2011-12-23 DIAGNOSIS — J449 Chronic obstructive pulmonary disease, unspecified: Secondary | ICD-10-CM | POA: Diagnosis not present

## 2011-12-25 DIAGNOSIS — R55 Syncope and collapse: Secondary | ICD-10-CM | POA: Diagnosis not present

## 2011-12-25 DIAGNOSIS — D510 Vitamin B12 deficiency anemia due to intrinsic factor deficiency: Secondary | ICD-10-CM | POA: Diagnosis not present

## 2011-12-25 DIAGNOSIS — E119 Type 2 diabetes mellitus without complications: Secondary | ICD-10-CM | POA: Diagnosis not present

## 2011-12-25 DIAGNOSIS — I4891 Unspecified atrial fibrillation: Secondary | ICD-10-CM | POA: Diagnosis not present

## 2011-12-25 DIAGNOSIS — M6281 Muscle weakness (generalized): Secondary | ICD-10-CM | POA: Diagnosis not present

## 2011-12-25 DIAGNOSIS — I951 Orthostatic hypotension: Secondary | ICD-10-CM | POA: Diagnosis not present

## 2011-12-28 DIAGNOSIS — M6281 Muscle weakness (generalized): Secondary | ICD-10-CM | POA: Diagnosis not present

## 2011-12-28 DIAGNOSIS — I951 Orthostatic hypotension: Secondary | ICD-10-CM | POA: Diagnosis not present

## 2011-12-28 DIAGNOSIS — R55 Syncope and collapse: Secondary | ICD-10-CM | POA: Diagnosis not present

## 2011-12-28 DIAGNOSIS — E119 Type 2 diabetes mellitus without complications: Secondary | ICD-10-CM | POA: Diagnosis not present

## 2011-12-28 DIAGNOSIS — D510 Vitamin B12 deficiency anemia due to intrinsic factor deficiency: Secondary | ICD-10-CM | POA: Diagnosis not present

## 2011-12-28 DIAGNOSIS — I4891 Unspecified atrial fibrillation: Secondary | ICD-10-CM | POA: Diagnosis not present

## 2011-12-30 DIAGNOSIS — R55 Syncope and collapse: Secondary | ICD-10-CM | POA: Diagnosis not present

## 2011-12-30 DIAGNOSIS — G40209 Localization-related (focal) (partial) symptomatic epilepsy and epileptic syndromes with complex partial seizures, not intractable, without status epilepticus: Secondary | ICD-10-CM | POA: Diagnosis not present

## 2011-12-30 DIAGNOSIS — R413 Other amnesia: Secondary | ICD-10-CM | POA: Diagnosis not present

## 2011-12-30 DIAGNOSIS — M6281 Muscle weakness (generalized): Secondary | ICD-10-CM | POA: Diagnosis not present

## 2011-12-30 DIAGNOSIS — I951 Orthostatic hypotension: Secondary | ICD-10-CM | POA: Diagnosis not present

## 2011-12-30 DIAGNOSIS — D510 Vitamin B12 deficiency anemia due to intrinsic factor deficiency: Secondary | ICD-10-CM | POA: Diagnosis not present

## 2011-12-30 DIAGNOSIS — E119 Type 2 diabetes mellitus without complications: Secondary | ICD-10-CM | POA: Diagnosis not present

## 2011-12-30 DIAGNOSIS — I4891 Unspecified atrial fibrillation: Secondary | ICD-10-CM | POA: Diagnosis not present

## 2011-12-31 DIAGNOSIS — E119 Type 2 diabetes mellitus without complications: Secondary | ICD-10-CM | POA: Diagnosis not present

## 2011-12-31 DIAGNOSIS — I10 Essential (primary) hypertension: Secondary | ICD-10-CM | POA: Diagnosis not present

## 2011-12-31 DIAGNOSIS — L97809 Non-pressure chronic ulcer of other part of unspecified lower leg with unspecified severity: Secondary | ICD-10-CM | POA: Diagnosis not present

## 2011-12-31 DIAGNOSIS — J449 Chronic obstructive pulmonary disease, unspecified: Secondary | ICD-10-CM | POA: Diagnosis not present

## 2011-12-31 DIAGNOSIS — R609 Edema, unspecified: Secondary | ICD-10-CM | POA: Diagnosis not present

## 2011-12-31 DIAGNOSIS — I499 Cardiac arrhythmia, unspecified: Secondary | ICD-10-CM | POA: Diagnosis not present

## 2011-12-31 DIAGNOSIS — M810 Age-related osteoporosis without current pathological fracture: Secondary | ICD-10-CM | POA: Diagnosis not present

## 2011-12-31 DIAGNOSIS — I87339 Chronic venous hypertension (idiopathic) with ulcer and inflammation of unspecified lower extremity: Secondary | ICD-10-CM | POA: Diagnosis not present

## 2012-01-01 DIAGNOSIS — E119 Type 2 diabetes mellitus without complications: Secondary | ICD-10-CM | POA: Diagnosis not present

## 2012-01-01 DIAGNOSIS — I951 Orthostatic hypotension: Secondary | ICD-10-CM | POA: Diagnosis not present

## 2012-01-01 DIAGNOSIS — R55 Syncope and collapse: Secondary | ICD-10-CM | POA: Diagnosis not present

## 2012-01-01 DIAGNOSIS — I4891 Unspecified atrial fibrillation: Secondary | ICD-10-CM | POA: Diagnosis not present

## 2012-01-01 DIAGNOSIS — M6281 Muscle weakness (generalized): Secondary | ICD-10-CM | POA: Diagnosis not present

## 2012-01-01 DIAGNOSIS — D510 Vitamin B12 deficiency anemia due to intrinsic factor deficiency: Secondary | ICD-10-CM | POA: Diagnosis not present

## 2012-01-04 DIAGNOSIS — I4891 Unspecified atrial fibrillation: Secondary | ICD-10-CM | POA: Diagnosis not present

## 2012-01-04 DIAGNOSIS — D510 Vitamin B12 deficiency anemia due to intrinsic factor deficiency: Secondary | ICD-10-CM | POA: Diagnosis not present

## 2012-01-04 DIAGNOSIS — R55 Syncope and collapse: Secondary | ICD-10-CM | POA: Diagnosis not present

## 2012-01-04 DIAGNOSIS — E119 Type 2 diabetes mellitus without complications: Secondary | ICD-10-CM | POA: Diagnosis not present

## 2012-01-04 DIAGNOSIS — I951 Orthostatic hypotension: Secondary | ICD-10-CM | POA: Diagnosis not present

## 2012-01-04 DIAGNOSIS — M6281 Muscle weakness (generalized): Secondary | ICD-10-CM | POA: Diagnosis not present

## 2012-01-06 DIAGNOSIS — I951 Orthostatic hypotension: Secondary | ICD-10-CM | POA: Diagnosis not present

## 2012-01-06 DIAGNOSIS — M6281 Muscle weakness (generalized): Secondary | ICD-10-CM | POA: Diagnosis not present

## 2012-01-06 DIAGNOSIS — I4891 Unspecified atrial fibrillation: Secondary | ICD-10-CM | POA: Diagnosis not present

## 2012-01-06 DIAGNOSIS — E119 Type 2 diabetes mellitus without complications: Secondary | ICD-10-CM | POA: Diagnosis not present

## 2012-01-06 DIAGNOSIS — R55 Syncope and collapse: Secondary | ICD-10-CM | POA: Diagnosis not present

## 2012-01-06 DIAGNOSIS — B351 Tinea unguium: Secondary | ICD-10-CM | POA: Diagnosis not present

## 2012-01-06 DIAGNOSIS — D510 Vitamin B12 deficiency anemia due to intrinsic factor deficiency: Secondary | ICD-10-CM | POA: Diagnosis not present

## 2012-01-07 DIAGNOSIS — J449 Chronic obstructive pulmonary disease, unspecified: Secondary | ICD-10-CM | POA: Diagnosis not present

## 2012-01-07 DIAGNOSIS — M810 Age-related osteoporosis without current pathological fracture: Secondary | ICD-10-CM | POA: Diagnosis not present

## 2012-01-07 DIAGNOSIS — L97809 Non-pressure chronic ulcer of other part of unspecified lower leg with unspecified severity: Secondary | ICD-10-CM | POA: Diagnosis not present

## 2012-01-07 DIAGNOSIS — R609 Edema, unspecified: Secondary | ICD-10-CM | POA: Diagnosis not present

## 2012-01-07 DIAGNOSIS — I10 Essential (primary) hypertension: Secondary | ICD-10-CM | POA: Diagnosis not present

## 2012-01-07 DIAGNOSIS — E119 Type 2 diabetes mellitus without complications: Secondary | ICD-10-CM | POA: Diagnosis not present

## 2012-01-07 DIAGNOSIS — I499 Cardiac arrhythmia, unspecified: Secondary | ICD-10-CM | POA: Diagnosis not present

## 2012-01-07 DIAGNOSIS — L97909 Non-pressure chronic ulcer of unspecified part of unspecified lower leg with unspecified severity: Secondary | ICD-10-CM | POA: Diagnosis not present

## 2012-01-08 DIAGNOSIS — R55 Syncope and collapse: Secondary | ICD-10-CM | POA: Diagnosis not present

## 2012-01-08 DIAGNOSIS — D510 Vitamin B12 deficiency anemia due to intrinsic factor deficiency: Secondary | ICD-10-CM | POA: Diagnosis not present

## 2012-01-08 DIAGNOSIS — E119 Type 2 diabetes mellitus without complications: Secondary | ICD-10-CM | POA: Diagnosis not present

## 2012-01-08 DIAGNOSIS — I951 Orthostatic hypotension: Secondary | ICD-10-CM | POA: Diagnosis not present

## 2012-01-08 DIAGNOSIS — I4891 Unspecified atrial fibrillation: Secondary | ICD-10-CM | POA: Diagnosis not present

## 2012-01-08 DIAGNOSIS — M6281 Muscle weakness (generalized): Secondary | ICD-10-CM | POA: Diagnosis not present

## 2012-01-12 DIAGNOSIS — I951 Orthostatic hypotension: Secondary | ICD-10-CM | POA: Diagnosis not present

## 2012-01-12 DIAGNOSIS — D510 Vitamin B12 deficiency anemia due to intrinsic factor deficiency: Secondary | ICD-10-CM | POA: Diagnosis not present

## 2012-01-12 DIAGNOSIS — E119 Type 2 diabetes mellitus without complications: Secondary | ICD-10-CM | POA: Diagnosis not present

## 2012-01-12 DIAGNOSIS — R55 Syncope and collapse: Secondary | ICD-10-CM | POA: Diagnosis not present

## 2012-01-12 DIAGNOSIS — M6281 Muscle weakness (generalized): Secondary | ICD-10-CM | POA: Diagnosis not present

## 2012-01-12 DIAGNOSIS — I4891 Unspecified atrial fibrillation: Secondary | ICD-10-CM | POA: Diagnosis not present

## 2012-01-13 DIAGNOSIS — I951 Orthostatic hypotension: Secondary | ICD-10-CM | POA: Diagnosis not present

## 2012-01-13 DIAGNOSIS — R55 Syncope and collapse: Secondary | ICD-10-CM | POA: Diagnosis not present

## 2012-01-13 DIAGNOSIS — I4891 Unspecified atrial fibrillation: Secondary | ICD-10-CM | POA: Diagnosis not present

## 2012-01-13 DIAGNOSIS — E119 Type 2 diabetes mellitus without complications: Secondary | ICD-10-CM | POA: Diagnosis not present

## 2012-01-13 DIAGNOSIS — M6281 Muscle weakness (generalized): Secondary | ICD-10-CM | POA: Diagnosis not present

## 2012-01-13 DIAGNOSIS — D510 Vitamin B12 deficiency anemia due to intrinsic factor deficiency: Secondary | ICD-10-CM | POA: Diagnosis not present

## 2012-01-15 DIAGNOSIS — M6281 Muscle weakness (generalized): Secondary | ICD-10-CM | POA: Diagnosis not present

## 2012-01-15 DIAGNOSIS — I951 Orthostatic hypotension: Secondary | ICD-10-CM | POA: Diagnosis not present

## 2012-01-15 DIAGNOSIS — R55 Syncope and collapse: Secondary | ICD-10-CM | POA: Diagnosis not present

## 2012-01-15 DIAGNOSIS — I4891 Unspecified atrial fibrillation: Secondary | ICD-10-CM | POA: Diagnosis not present

## 2012-01-15 DIAGNOSIS — D510 Vitamin B12 deficiency anemia due to intrinsic factor deficiency: Secondary | ICD-10-CM | POA: Diagnosis not present

## 2012-01-15 DIAGNOSIS — E119 Type 2 diabetes mellitus without complications: Secondary | ICD-10-CM | POA: Diagnosis not present

## 2012-01-18 DIAGNOSIS — M6281 Muscle weakness (generalized): Secondary | ICD-10-CM | POA: Diagnosis not present

## 2012-01-18 DIAGNOSIS — D510 Vitamin B12 deficiency anemia due to intrinsic factor deficiency: Secondary | ICD-10-CM | POA: Diagnosis not present

## 2012-01-18 DIAGNOSIS — I4891 Unspecified atrial fibrillation: Secondary | ICD-10-CM | POA: Diagnosis not present

## 2012-01-18 DIAGNOSIS — R55 Syncope and collapse: Secondary | ICD-10-CM | POA: Diagnosis not present

## 2012-01-18 DIAGNOSIS — I951 Orthostatic hypotension: Secondary | ICD-10-CM | POA: Diagnosis not present

## 2012-01-18 DIAGNOSIS — E119 Type 2 diabetes mellitus without complications: Secondary | ICD-10-CM | POA: Diagnosis not present

## 2012-01-19 ENCOUNTER — Encounter: Payer: Self-pay | Admitting: Cardiothoracic Surgery

## 2012-01-19 ENCOUNTER — Encounter: Payer: Self-pay | Admitting: Nurse Practitioner

## 2012-01-19 DIAGNOSIS — R609 Edema, unspecified: Secondary | ICD-10-CM | POA: Diagnosis not present

## 2012-01-20 DIAGNOSIS — D510 Vitamin B12 deficiency anemia due to intrinsic factor deficiency: Secondary | ICD-10-CM | POA: Diagnosis not present

## 2012-01-20 DIAGNOSIS — E119 Type 2 diabetes mellitus without complications: Secondary | ICD-10-CM | POA: Diagnosis not present

## 2012-01-20 DIAGNOSIS — R55 Syncope and collapse: Secondary | ICD-10-CM | POA: Diagnosis not present

## 2012-01-20 DIAGNOSIS — I951 Orthostatic hypotension: Secondary | ICD-10-CM | POA: Diagnosis not present

## 2012-01-20 DIAGNOSIS — M6281 Muscle weakness (generalized): Secondary | ICD-10-CM | POA: Diagnosis not present

## 2012-01-20 DIAGNOSIS — I4891 Unspecified atrial fibrillation: Secondary | ICD-10-CM | POA: Diagnosis not present

## 2012-01-22 DIAGNOSIS — E119 Type 2 diabetes mellitus without complications: Secondary | ICD-10-CM | POA: Diagnosis not present

## 2012-01-22 DIAGNOSIS — I951 Orthostatic hypotension: Secondary | ICD-10-CM | POA: Diagnosis not present

## 2012-01-22 DIAGNOSIS — M6281 Muscle weakness (generalized): Secondary | ICD-10-CM | POA: Diagnosis not present

## 2012-01-22 DIAGNOSIS — I4891 Unspecified atrial fibrillation: Secondary | ICD-10-CM | POA: Diagnosis not present

## 2012-01-22 DIAGNOSIS — D510 Vitamin B12 deficiency anemia due to intrinsic factor deficiency: Secondary | ICD-10-CM | POA: Diagnosis not present

## 2012-01-22 DIAGNOSIS — R55 Syncope and collapse: Secondary | ICD-10-CM | POA: Diagnosis not present

## 2012-01-25 DIAGNOSIS — D510 Vitamin B12 deficiency anemia due to intrinsic factor deficiency: Secondary | ICD-10-CM | POA: Diagnosis not present

## 2012-01-25 DIAGNOSIS — E119 Type 2 diabetes mellitus without complications: Secondary | ICD-10-CM | POA: Diagnosis not present

## 2012-01-25 DIAGNOSIS — I4891 Unspecified atrial fibrillation: Secondary | ICD-10-CM | POA: Diagnosis not present

## 2012-01-25 DIAGNOSIS — R55 Syncope and collapse: Secondary | ICD-10-CM | POA: Diagnosis not present

## 2012-01-25 DIAGNOSIS — I951 Orthostatic hypotension: Secondary | ICD-10-CM | POA: Diagnosis not present

## 2012-01-25 DIAGNOSIS — M6281 Muscle weakness (generalized): Secondary | ICD-10-CM | POA: Diagnosis not present

## 2012-01-27 DIAGNOSIS — I4891 Unspecified atrial fibrillation: Secondary | ICD-10-CM | POA: Diagnosis not present

## 2012-01-27 DIAGNOSIS — I951 Orthostatic hypotension: Secondary | ICD-10-CM | POA: Diagnosis not present

## 2012-01-27 DIAGNOSIS — D510 Vitamin B12 deficiency anemia due to intrinsic factor deficiency: Secondary | ICD-10-CM | POA: Diagnosis not present

## 2012-01-27 DIAGNOSIS — E119 Type 2 diabetes mellitus without complications: Secondary | ICD-10-CM | POA: Diagnosis not present

## 2012-01-28 DIAGNOSIS — E119 Type 2 diabetes mellitus without complications: Secondary | ICD-10-CM | POA: Diagnosis not present

## 2012-01-28 DIAGNOSIS — I4891 Unspecified atrial fibrillation: Secondary | ICD-10-CM | POA: Diagnosis not present

## 2012-01-28 DIAGNOSIS — R609 Edema, unspecified: Secondary | ICD-10-CM | POA: Diagnosis not present

## 2012-01-28 DIAGNOSIS — D510 Vitamin B12 deficiency anemia due to intrinsic factor deficiency: Secondary | ICD-10-CM | POA: Diagnosis not present

## 2012-01-28 DIAGNOSIS — I951 Orthostatic hypotension: Secondary | ICD-10-CM | POA: Diagnosis not present

## 2012-01-29 DIAGNOSIS — I951 Orthostatic hypotension: Secondary | ICD-10-CM | POA: Diagnosis not present

## 2012-01-29 DIAGNOSIS — D510 Vitamin B12 deficiency anemia due to intrinsic factor deficiency: Secondary | ICD-10-CM | POA: Diagnosis not present

## 2012-01-29 DIAGNOSIS — I4891 Unspecified atrial fibrillation: Secondary | ICD-10-CM | POA: Diagnosis not present

## 2012-01-29 DIAGNOSIS — E119 Type 2 diabetes mellitus without complications: Secondary | ICD-10-CM | POA: Diagnosis not present

## 2012-02-01 DIAGNOSIS — I951 Orthostatic hypotension: Secondary | ICD-10-CM | POA: Diagnosis not present

## 2012-02-01 DIAGNOSIS — D510 Vitamin B12 deficiency anemia due to intrinsic factor deficiency: Secondary | ICD-10-CM | POA: Diagnosis not present

## 2012-02-01 DIAGNOSIS — I4891 Unspecified atrial fibrillation: Secondary | ICD-10-CM | POA: Diagnosis not present

## 2012-02-01 DIAGNOSIS — E119 Type 2 diabetes mellitus without complications: Secondary | ICD-10-CM | POA: Diagnosis not present

## 2012-02-03 DIAGNOSIS — I951 Orthostatic hypotension: Secondary | ICD-10-CM | POA: Diagnosis not present

## 2012-02-03 DIAGNOSIS — D510 Vitamin B12 deficiency anemia due to intrinsic factor deficiency: Secondary | ICD-10-CM | POA: Diagnosis not present

## 2012-02-03 DIAGNOSIS — E119 Type 2 diabetes mellitus without complications: Secondary | ICD-10-CM | POA: Diagnosis not present

## 2012-02-03 DIAGNOSIS — I4891 Unspecified atrial fibrillation: Secondary | ICD-10-CM | POA: Diagnosis not present

## 2012-02-05 DIAGNOSIS — I951 Orthostatic hypotension: Secondary | ICD-10-CM | POA: Diagnosis not present

## 2012-02-05 DIAGNOSIS — E119 Type 2 diabetes mellitus without complications: Secondary | ICD-10-CM | POA: Diagnosis not present

## 2012-02-05 DIAGNOSIS — I4891 Unspecified atrial fibrillation: Secondary | ICD-10-CM | POA: Diagnosis not present

## 2012-02-05 DIAGNOSIS — D510 Vitamin B12 deficiency anemia due to intrinsic factor deficiency: Secondary | ICD-10-CM | POA: Diagnosis not present

## 2012-02-08 DIAGNOSIS — I951 Orthostatic hypotension: Secondary | ICD-10-CM | POA: Diagnosis not present

## 2012-02-08 DIAGNOSIS — D510 Vitamin B12 deficiency anemia due to intrinsic factor deficiency: Secondary | ICD-10-CM | POA: Diagnosis not present

## 2012-02-08 DIAGNOSIS — E119 Type 2 diabetes mellitus without complications: Secondary | ICD-10-CM | POA: Diagnosis not present

## 2012-02-08 DIAGNOSIS — I4891 Unspecified atrial fibrillation: Secondary | ICD-10-CM | POA: Diagnosis not present

## 2012-02-10 DIAGNOSIS — I951 Orthostatic hypotension: Secondary | ICD-10-CM | POA: Diagnosis not present

## 2012-02-10 DIAGNOSIS — I4891 Unspecified atrial fibrillation: Secondary | ICD-10-CM | POA: Diagnosis not present

## 2012-02-10 DIAGNOSIS — D510 Vitamin B12 deficiency anemia due to intrinsic factor deficiency: Secondary | ICD-10-CM | POA: Diagnosis not present

## 2012-02-10 DIAGNOSIS — E119 Type 2 diabetes mellitus without complications: Secondary | ICD-10-CM | POA: Diagnosis not present

## 2012-02-12 DIAGNOSIS — D510 Vitamin B12 deficiency anemia due to intrinsic factor deficiency: Secondary | ICD-10-CM | POA: Diagnosis not present

## 2012-02-12 DIAGNOSIS — E119 Type 2 diabetes mellitus without complications: Secondary | ICD-10-CM | POA: Diagnosis not present

## 2012-02-12 DIAGNOSIS — I4891 Unspecified atrial fibrillation: Secondary | ICD-10-CM | POA: Diagnosis not present

## 2012-02-12 DIAGNOSIS — I951 Orthostatic hypotension: Secondary | ICD-10-CM | POA: Diagnosis not present

## 2012-02-15 DIAGNOSIS — D510 Vitamin B12 deficiency anemia due to intrinsic factor deficiency: Secondary | ICD-10-CM | POA: Diagnosis not present

## 2012-02-15 DIAGNOSIS — E119 Type 2 diabetes mellitus without complications: Secondary | ICD-10-CM | POA: Diagnosis not present

## 2012-02-15 DIAGNOSIS — I951 Orthostatic hypotension: Secondary | ICD-10-CM | POA: Diagnosis not present

## 2012-02-15 DIAGNOSIS — I4891 Unspecified atrial fibrillation: Secondary | ICD-10-CM | POA: Diagnosis not present

## 2012-02-17 DIAGNOSIS — I951 Orthostatic hypotension: Secondary | ICD-10-CM | POA: Diagnosis not present

## 2012-02-17 DIAGNOSIS — D510 Vitamin B12 deficiency anemia due to intrinsic factor deficiency: Secondary | ICD-10-CM | POA: Diagnosis not present

## 2012-02-17 DIAGNOSIS — E119 Type 2 diabetes mellitus without complications: Secondary | ICD-10-CM | POA: Diagnosis not present

## 2012-02-17 DIAGNOSIS — I4891 Unspecified atrial fibrillation: Secondary | ICD-10-CM | POA: Diagnosis not present

## 2012-02-18 DIAGNOSIS — H269 Unspecified cataract: Secondary | ICD-10-CM | POA: Diagnosis not present

## 2012-02-18 DIAGNOSIS — H409 Unspecified glaucoma: Secondary | ICD-10-CM | POA: Diagnosis not present

## 2012-02-18 DIAGNOSIS — H4011X0 Primary open-angle glaucoma, stage unspecified: Secondary | ICD-10-CM | POA: Diagnosis not present

## 2012-02-19 DIAGNOSIS — I951 Orthostatic hypotension: Secondary | ICD-10-CM | POA: Diagnosis not present

## 2012-02-19 DIAGNOSIS — D510 Vitamin B12 deficiency anemia due to intrinsic factor deficiency: Secondary | ICD-10-CM | POA: Diagnosis not present

## 2012-02-19 DIAGNOSIS — E119 Type 2 diabetes mellitus without complications: Secondary | ICD-10-CM | POA: Diagnosis not present

## 2012-02-19 DIAGNOSIS — I4891 Unspecified atrial fibrillation: Secondary | ICD-10-CM | POA: Diagnosis not present

## 2012-02-22 DIAGNOSIS — D510 Vitamin B12 deficiency anemia due to intrinsic factor deficiency: Secondary | ICD-10-CM | POA: Diagnosis not present

## 2012-02-22 DIAGNOSIS — E119 Type 2 diabetes mellitus without complications: Secondary | ICD-10-CM | POA: Diagnosis not present

## 2012-02-22 DIAGNOSIS — I4891 Unspecified atrial fibrillation: Secondary | ICD-10-CM | POA: Diagnosis not present

## 2012-02-22 DIAGNOSIS — I951 Orthostatic hypotension: Secondary | ICD-10-CM | POA: Diagnosis not present

## 2012-02-24 DIAGNOSIS — D510 Vitamin B12 deficiency anemia due to intrinsic factor deficiency: Secondary | ICD-10-CM | POA: Diagnosis not present

## 2012-02-24 DIAGNOSIS — I4891 Unspecified atrial fibrillation: Secondary | ICD-10-CM | POA: Diagnosis not present

## 2012-02-24 DIAGNOSIS — I951 Orthostatic hypotension: Secondary | ICD-10-CM | POA: Diagnosis not present

## 2012-02-24 DIAGNOSIS — E119 Type 2 diabetes mellitus without complications: Secondary | ICD-10-CM | POA: Diagnosis not present

## 2012-02-26 DIAGNOSIS — E119 Type 2 diabetes mellitus without complications: Secondary | ICD-10-CM | POA: Diagnosis not present

## 2012-02-26 DIAGNOSIS — I4891 Unspecified atrial fibrillation: Secondary | ICD-10-CM | POA: Diagnosis not present

## 2012-02-26 DIAGNOSIS — D510 Vitamin B12 deficiency anemia due to intrinsic factor deficiency: Secondary | ICD-10-CM | POA: Diagnosis not present

## 2012-02-26 DIAGNOSIS — I951 Orthostatic hypotension: Secondary | ICD-10-CM | POA: Diagnosis not present

## 2012-02-29 DIAGNOSIS — I4891 Unspecified atrial fibrillation: Secondary | ICD-10-CM | POA: Diagnosis not present

## 2012-02-29 DIAGNOSIS — D510 Vitamin B12 deficiency anemia due to intrinsic factor deficiency: Secondary | ICD-10-CM | POA: Diagnosis not present

## 2012-02-29 DIAGNOSIS — E119 Type 2 diabetes mellitus without complications: Secondary | ICD-10-CM | POA: Diagnosis not present

## 2012-02-29 DIAGNOSIS — I951 Orthostatic hypotension: Secondary | ICD-10-CM | POA: Diagnosis not present

## 2012-03-01 DIAGNOSIS — I951 Orthostatic hypotension: Secondary | ICD-10-CM | POA: Diagnosis not present

## 2012-03-01 DIAGNOSIS — E119 Type 2 diabetes mellitus without complications: Secondary | ICD-10-CM | POA: Diagnosis not present

## 2012-03-01 DIAGNOSIS — E7800 Pure hypercholesterolemia, unspecified: Secondary | ICD-10-CM | POA: Diagnosis not present

## 2012-03-01 DIAGNOSIS — I10 Essential (primary) hypertension: Secondary | ICD-10-CM | POA: Diagnosis not present

## 2012-03-01 DIAGNOSIS — I4891 Unspecified atrial fibrillation: Secondary | ICD-10-CM | POA: Diagnosis not present

## 2012-03-02 DIAGNOSIS — I4891 Unspecified atrial fibrillation: Secondary | ICD-10-CM | POA: Diagnosis not present

## 2012-03-02 DIAGNOSIS — D510 Vitamin B12 deficiency anemia due to intrinsic factor deficiency: Secondary | ICD-10-CM | POA: Diagnosis not present

## 2012-03-02 DIAGNOSIS — I951 Orthostatic hypotension: Secondary | ICD-10-CM | POA: Diagnosis not present

## 2012-03-02 DIAGNOSIS — E119 Type 2 diabetes mellitus without complications: Secondary | ICD-10-CM | POA: Diagnosis not present

## 2012-03-04 DIAGNOSIS — I951 Orthostatic hypotension: Secondary | ICD-10-CM | POA: Diagnosis not present

## 2012-03-04 DIAGNOSIS — E119 Type 2 diabetes mellitus without complications: Secondary | ICD-10-CM | POA: Diagnosis not present

## 2012-03-04 DIAGNOSIS — D510 Vitamin B12 deficiency anemia due to intrinsic factor deficiency: Secondary | ICD-10-CM | POA: Diagnosis not present

## 2012-03-04 DIAGNOSIS — I4891 Unspecified atrial fibrillation: Secondary | ICD-10-CM | POA: Diagnosis not present

## 2012-03-07 DIAGNOSIS — I951 Orthostatic hypotension: Secondary | ICD-10-CM | POA: Diagnosis not present

## 2012-03-07 DIAGNOSIS — I4891 Unspecified atrial fibrillation: Secondary | ICD-10-CM | POA: Diagnosis not present

## 2012-03-07 DIAGNOSIS — E119 Type 2 diabetes mellitus without complications: Secondary | ICD-10-CM | POA: Diagnosis not present

## 2012-03-07 DIAGNOSIS — D510 Vitamin B12 deficiency anemia due to intrinsic factor deficiency: Secondary | ICD-10-CM | POA: Diagnosis not present

## 2012-03-09 DIAGNOSIS — I951 Orthostatic hypotension: Secondary | ICD-10-CM | POA: Diagnosis not present

## 2012-03-09 DIAGNOSIS — I4891 Unspecified atrial fibrillation: Secondary | ICD-10-CM | POA: Diagnosis not present

## 2012-03-09 DIAGNOSIS — E119 Type 2 diabetes mellitus without complications: Secondary | ICD-10-CM | POA: Diagnosis not present

## 2012-03-09 DIAGNOSIS — D510 Vitamin B12 deficiency anemia due to intrinsic factor deficiency: Secondary | ICD-10-CM | POA: Diagnosis not present

## 2012-03-11 DIAGNOSIS — I4891 Unspecified atrial fibrillation: Secondary | ICD-10-CM | POA: Diagnosis not present

## 2012-03-11 DIAGNOSIS — E119 Type 2 diabetes mellitus without complications: Secondary | ICD-10-CM | POA: Diagnosis not present

## 2012-03-11 DIAGNOSIS — D510 Vitamin B12 deficiency anemia due to intrinsic factor deficiency: Secondary | ICD-10-CM | POA: Diagnosis not present

## 2012-03-11 DIAGNOSIS — I951 Orthostatic hypotension: Secondary | ICD-10-CM | POA: Diagnosis not present

## 2012-03-14 DIAGNOSIS — I4891 Unspecified atrial fibrillation: Secondary | ICD-10-CM | POA: Diagnosis not present

## 2012-03-14 DIAGNOSIS — D510 Vitamin B12 deficiency anemia due to intrinsic factor deficiency: Secondary | ICD-10-CM | POA: Diagnosis not present

## 2012-03-14 DIAGNOSIS — I951 Orthostatic hypotension: Secondary | ICD-10-CM | POA: Diagnosis not present

## 2012-03-14 DIAGNOSIS — E119 Type 2 diabetes mellitus without complications: Secondary | ICD-10-CM | POA: Diagnosis not present

## 2012-03-15 DIAGNOSIS — I951 Orthostatic hypotension: Secondary | ICD-10-CM | POA: Diagnosis not present

## 2012-03-15 DIAGNOSIS — E119 Type 2 diabetes mellitus without complications: Secondary | ICD-10-CM | POA: Diagnosis not present

## 2012-03-15 DIAGNOSIS — D510 Vitamin B12 deficiency anemia due to intrinsic factor deficiency: Secondary | ICD-10-CM | POA: Diagnosis not present

## 2012-03-15 DIAGNOSIS — I4891 Unspecified atrial fibrillation: Secondary | ICD-10-CM | POA: Diagnosis not present

## 2012-03-16 DIAGNOSIS — E119 Type 2 diabetes mellitus without complications: Secondary | ICD-10-CM | POA: Diagnosis not present

## 2012-03-16 DIAGNOSIS — I4891 Unspecified atrial fibrillation: Secondary | ICD-10-CM | POA: Diagnosis not present

## 2012-03-16 DIAGNOSIS — D510 Vitamin B12 deficiency anemia due to intrinsic factor deficiency: Secondary | ICD-10-CM | POA: Diagnosis not present

## 2012-03-16 DIAGNOSIS — I951 Orthostatic hypotension: Secondary | ICD-10-CM | POA: Diagnosis not present

## 2012-03-19 DIAGNOSIS — I951 Orthostatic hypotension: Secondary | ICD-10-CM | POA: Diagnosis not present

## 2012-03-19 DIAGNOSIS — D510 Vitamin B12 deficiency anemia due to intrinsic factor deficiency: Secondary | ICD-10-CM | POA: Diagnosis not present

## 2012-03-19 DIAGNOSIS — E119 Type 2 diabetes mellitus without complications: Secondary | ICD-10-CM | POA: Diagnosis not present

## 2012-03-19 DIAGNOSIS — I4891 Unspecified atrial fibrillation: Secondary | ICD-10-CM | POA: Diagnosis not present

## 2012-03-21 DIAGNOSIS — E119 Type 2 diabetes mellitus without complications: Secondary | ICD-10-CM | POA: Diagnosis not present

## 2012-03-21 DIAGNOSIS — I951 Orthostatic hypotension: Secondary | ICD-10-CM | POA: Diagnosis not present

## 2012-03-21 DIAGNOSIS — D510 Vitamin B12 deficiency anemia due to intrinsic factor deficiency: Secondary | ICD-10-CM | POA: Diagnosis not present

## 2012-03-21 DIAGNOSIS — I4891 Unspecified atrial fibrillation: Secondary | ICD-10-CM | POA: Diagnosis not present

## 2012-03-23 DIAGNOSIS — D510 Vitamin B12 deficiency anemia due to intrinsic factor deficiency: Secondary | ICD-10-CM | POA: Diagnosis not present

## 2012-03-23 DIAGNOSIS — I951 Orthostatic hypotension: Secondary | ICD-10-CM | POA: Diagnosis not present

## 2012-03-23 DIAGNOSIS — E119 Type 2 diabetes mellitus without complications: Secondary | ICD-10-CM | POA: Diagnosis not present

## 2012-03-23 DIAGNOSIS — I4891 Unspecified atrial fibrillation: Secondary | ICD-10-CM | POA: Diagnosis not present

## 2012-03-25 DIAGNOSIS — I951 Orthostatic hypotension: Secondary | ICD-10-CM | POA: Diagnosis not present

## 2012-03-25 DIAGNOSIS — I4891 Unspecified atrial fibrillation: Secondary | ICD-10-CM | POA: Diagnosis not present

## 2012-03-25 DIAGNOSIS — D510 Vitamin B12 deficiency anemia due to intrinsic factor deficiency: Secondary | ICD-10-CM | POA: Diagnosis not present

## 2012-03-25 DIAGNOSIS — E119 Type 2 diabetes mellitus without complications: Secondary | ICD-10-CM | POA: Diagnosis not present

## 2012-03-27 DIAGNOSIS — E119 Type 2 diabetes mellitus without complications: Secondary | ICD-10-CM | POA: Diagnosis not present

## 2012-03-27 DIAGNOSIS — D510 Vitamin B12 deficiency anemia due to intrinsic factor deficiency: Secondary | ICD-10-CM | POA: Diagnosis not present

## 2012-03-27 DIAGNOSIS — I951 Orthostatic hypotension: Secondary | ICD-10-CM | POA: Diagnosis not present

## 2012-03-27 DIAGNOSIS — I4891 Unspecified atrial fibrillation: Secondary | ICD-10-CM | POA: Diagnosis not present

## 2012-03-30 DIAGNOSIS — B351 Tinea unguium: Secondary | ICD-10-CM | POA: Diagnosis not present

## 2012-03-30 DIAGNOSIS — M722 Plantar fascial fibromatosis: Secondary | ICD-10-CM | POA: Diagnosis not present

## 2012-03-30 DIAGNOSIS — E119 Type 2 diabetes mellitus without complications: Secondary | ICD-10-CM | POA: Diagnosis not present

## 2012-04-01 DIAGNOSIS — D510 Vitamin B12 deficiency anemia due to intrinsic factor deficiency: Secondary | ICD-10-CM | POA: Diagnosis not present

## 2012-04-01 DIAGNOSIS — I951 Orthostatic hypotension: Secondary | ICD-10-CM | POA: Diagnosis not present

## 2012-04-01 DIAGNOSIS — E119 Type 2 diabetes mellitus without complications: Secondary | ICD-10-CM | POA: Diagnosis not present

## 2012-04-01 DIAGNOSIS — I4891 Unspecified atrial fibrillation: Secondary | ICD-10-CM | POA: Diagnosis not present

## 2012-04-04 DIAGNOSIS — I4891 Unspecified atrial fibrillation: Secondary | ICD-10-CM | POA: Diagnosis not present

## 2012-04-04 DIAGNOSIS — I951 Orthostatic hypotension: Secondary | ICD-10-CM | POA: Diagnosis not present

## 2012-04-04 DIAGNOSIS — D510 Vitamin B12 deficiency anemia due to intrinsic factor deficiency: Secondary | ICD-10-CM | POA: Diagnosis not present

## 2012-04-04 DIAGNOSIS — E119 Type 2 diabetes mellitus without complications: Secondary | ICD-10-CM | POA: Diagnosis not present

## 2012-04-07 DIAGNOSIS — I4891 Unspecified atrial fibrillation: Secondary | ICD-10-CM | POA: Diagnosis not present

## 2012-04-07 DIAGNOSIS — D510 Vitamin B12 deficiency anemia due to intrinsic factor deficiency: Secondary | ICD-10-CM | POA: Diagnosis not present

## 2012-04-07 DIAGNOSIS — E119 Type 2 diabetes mellitus without complications: Secondary | ICD-10-CM | POA: Diagnosis not present

## 2012-04-07 DIAGNOSIS — I951 Orthostatic hypotension: Secondary | ICD-10-CM | POA: Diagnosis not present

## 2012-04-08 DIAGNOSIS — I4891 Unspecified atrial fibrillation: Secondary | ICD-10-CM | POA: Diagnosis not present

## 2012-04-08 DIAGNOSIS — I951 Orthostatic hypotension: Secondary | ICD-10-CM | POA: Diagnosis not present

## 2012-04-08 DIAGNOSIS — E119 Type 2 diabetes mellitus without complications: Secondary | ICD-10-CM | POA: Diagnosis not present

## 2012-04-08 DIAGNOSIS — D510 Vitamin B12 deficiency anemia due to intrinsic factor deficiency: Secondary | ICD-10-CM | POA: Diagnosis not present

## 2012-04-11 DIAGNOSIS — E119 Type 2 diabetes mellitus without complications: Secondary | ICD-10-CM | POA: Diagnosis not present

## 2012-04-11 DIAGNOSIS — D510 Vitamin B12 deficiency anemia due to intrinsic factor deficiency: Secondary | ICD-10-CM | POA: Diagnosis not present

## 2012-04-11 DIAGNOSIS — I951 Orthostatic hypotension: Secondary | ICD-10-CM | POA: Diagnosis not present

## 2012-04-11 DIAGNOSIS — I4891 Unspecified atrial fibrillation: Secondary | ICD-10-CM | POA: Diagnosis not present

## 2012-04-12 DIAGNOSIS — E119 Type 2 diabetes mellitus without complications: Secondary | ICD-10-CM | POA: Diagnosis not present

## 2012-04-12 DIAGNOSIS — I951 Orthostatic hypotension: Secondary | ICD-10-CM | POA: Diagnosis not present

## 2012-04-12 DIAGNOSIS — D510 Vitamin B12 deficiency anemia due to intrinsic factor deficiency: Secondary | ICD-10-CM | POA: Diagnosis not present

## 2012-04-12 DIAGNOSIS — I4891 Unspecified atrial fibrillation: Secondary | ICD-10-CM | POA: Diagnosis not present

## 2012-04-15 DIAGNOSIS — I951 Orthostatic hypotension: Secondary | ICD-10-CM | POA: Diagnosis not present

## 2012-04-15 DIAGNOSIS — E119 Type 2 diabetes mellitus without complications: Secondary | ICD-10-CM | POA: Diagnosis not present

## 2012-04-15 DIAGNOSIS — I4891 Unspecified atrial fibrillation: Secondary | ICD-10-CM | POA: Diagnosis not present

## 2012-04-15 DIAGNOSIS — D510 Vitamin B12 deficiency anemia due to intrinsic factor deficiency: Secondary | ICD-10-CM | POA: Diagnosis not present

## 2012-04-18 DIAGNOSIS — E119 Type 2 diabetes mellitus without complications: Secondary | ICD-10-CM | POA: Diagnosis not present

## 2012-04-18 DIAGNOSIS — I4891 Unspecified atrial fibrillation: Secondary | ICD-10-CM | POA: Diagnosis not present

## 2012-04-18 DIAGNOSIS — D510 Vitamin B12 deficiency anemia due to intrinsic factor deficiency: Secondary | ICD-10-CM | POA: Diagnosis not present

## 2012-04-18 DIAGNOSIS — I951 Orthostatic hypotension: Secondary | ICD-10-CM | POA: Diagnosis not present

## 2012-04-19 DIAGNOSIS — E119 Type 2 diabetes mellitus without complications: Secondary | ICD-10-CM | POA: Diagnosis not present

## 2012-04-19 DIAGNOSIS — D510 Vitamin B12 deficiency anemia due to intrinsic factor deficiency: Secondary | ICD-10-CM | POA: Diagnosis not present

## 2012-04-19 DIAGNOSIS — I4891 Unspecified atrial fibrillation: Secondary | ICD-10-CM | POA: Diagnosis not present

## 2012-04-19 DIAGNOSIS — I951 Orthostatic hypotension: Secondary | ICD-10-CM | POA: Diagnosis not present

## 2012-04-22 DIAGNOSIS — D510 Vitamin B12 deficiency anemia due to intrinsic factor deficiency: Secondary | ICD-10-CM | POA: Diagnosis not present

## 2012-04-22 DIAGNOSIS — E119 Type 2 diabetes mellitus without complications: Secondary | ICD-10-CM | POA: Diagnosis not present

## 2012-04-22 DIAGNOSIS — I4891 Unspecified atrial fibrillation: Secondary | ICD-10-CM | POA: Diagnosis not present

## 2012-04-22 DIAGNOSIS — I951 Orthostatic hypotension: Secondary | ICD-10-CM | POA: Diagnosis not present

## 2012-04-25 DIAGNOSIS — E119 Type 2 diabetes mellitus without complications: Secondary | ICD-10-CM | POA: Diagnosis not present

## 2012-04-25 DIAGNOSIS — I951 Orthostatic hypotension: Secondary | ICD-10-CM | POA: Diagnosis not present

## 2012-04-25 DIAGNOSIS — D510 Vitamin B12 deficiency anemia due to intrinsic factor deficiency: Secondary | ICD-10-CM | POA: Diagnosis not present

## 2012-04-25 DIAGNOSIS — I4891 Unspecified atrial fibrillation: Secondary | ICD-10-CM | POA: Diagnosis not present

## 2012-04-27 DIAGNOSIS — I4891 Unspecified atrial fibrillation: Secondary | ICD-10-CM | POA: Diagnosis not present

## 2012-04-27 DIAGNOSIS — D510 Vitamin B12 deficiency anemia due to intrinsic factor deficiency: Secondary | ICD-10-CM | POA: Diagnosis not present

## 2012-04-27 DIAGNOSIS — I951 Orthostatic hypotension: Secondary | ICD-10-CM | POA: Diagnosis not present

## 2012-04-27 DIAGNOSIS — E119 Type 2 diabetes mellitus without complications: Secondary | ICD-10-CM | POA: Diagnosis not present

## 2012-04-29 DIAGNOSIS — I4891 Unspecified atrial fibrillation: Secondary | ICD-10-CM | POA: Diagnosis not present

## 2012-04-29 DIAGNOSIS — D510 Vitamin B12 deficiency anemia due to intrinsic factor deficiency: Secondary | ICD-10-CM | POA: Diagnosis not present

## 2012-04-29 DIAGNOSIS — I951 Orthostatic hypotension: Secondary | ICD-10-CM | POA: Diagnosis not present

## 2012-04-29 DIAGNOSIS — E119 Type 2 diabetes mellitus without complications: Secondary | ICD-10-CM | POA: Diagnosis not present

## 2012-05-02 DIAGNOSIS — I4891 Unspecified atrial fibrillation: Secondary | ICD-10-CM | POA: Diagnosis not present

## 2012-05-02 DIAGNOSIS — E119 Type 2 diabetes mellitus without complications: Secondary | ICD-10-CM | POA: Diagnosis not present

## 2012-05-02 DIAGNOSIS — I951 Orthostatic hypotension: Secondary | ICD-10-CM | POA: Diagnosis not present

## 2012-05-02 DIAGNOSIS — D510 Vitamin B12 deficiency anemia due to intrinsic factor deficiency: Secondary | ICD-10-CM | POA: Diagnosis not present

## 2012-05-04 DIAGNOSIS — I4891 Unspecified atrial fibrillation: Secondary | ICD-10-CM | POA: Diagnosis not present

## 2012-05-04 DIAGNOSIS — D510 Vitamin B12 deficiency anemia due to intrinsic factor deficiency: Secondary | ICD-10-CM | POA: Diagnosis not present

## 2012-05-04 DIAGNOSIS — E119 Type 2 diabetes mellitus without complications: Secondary | ICD-10-CM | POA: Diagnosis not present

## 2012-05-04 DIAGNOSIS — I951 Orthostatic hypotension: Secondary | ICD-10-CM | POA: Diagnosis not present

## 2012-05-06 DIAGNOSIS — E119 Type 2 diabetes mellitus without complications: Secondary | ICD-10-CM | POA: Diagnosis not present

## 2012-05-06 DIAGNOSIS — I951 Orthostatic hypotension: Secondary | ICD-10-CM | POA: Diagnosis not present

## 2012-05-06 DIAGNOSIS — D510 Vitamin B12 deficiency anemia due to intrinsic factor deficiency: Secondary | ICD-10-CM | POA: Diagnosis not present

## 2012-05-06 DIAGNOSIS — I4891 Unspecified atrial fibrillation: Secondary | ICD-10-CM | POA: Diagnosis not present

## 2012-05-09 DIAGNOSIS — E119 Type 2 diabetes mellitus without complications: Secondary | ICD-10-CM | POA: Diagnosis not present

## 2012-05-09 DIAGNOSIS — D510 Vitamin B12 deficiency anemia due to intrinsic factor deficiency: Secondary | ICD-10-CM | POA: Diagnosis not present

## 2012-05-09 DIAGNOSIS — I4891 Unspecified atrial fibrillation: Secondary | ICD-10-CM | POA: Diagnosis not present

## 2012-05-09 DIAGNOSIS — I951 Orthostatic hypotension: Secondary | ICD-10-CM | POA: Diagnosis not present

## 2012-05-11 DIAGNOSIS — I4891 Unspecified atrial fibrillation: Secondary | ICD-10-CM | POA: Diagnosis not present

## 2012-05-11 DIAGNOSIS — E119 Type 2 diabetes mellitus without complications: Secondary | ICD-10-CM | POA: Diagnosis not present

## 2012-05-11 DIAGNOSIS — I951 Orthostatic hypotension: Secondary | ICD-10-CM | POA: Diagnosis not present

## 2012-05-11 DIAGNOSIS — D510 Vitamin B12 deficiency anemia due to intrinsic factor deficiency: Secondary | ICD-10-CM | POA: Diagnosis not present

## 2012-05-13 DIAGNOSIS — E119 Type 2 diabetes mellitus without complications: Secondary | ICD-10-CM | POA: Diagnosis not present

## 2012-05-13 DIAGNOSIS — I4891 Unspecified atrial fibrillation: Secondary | ICD-10-CM | POA: Diagnosis not present

## 2012-05-13 DIAGNOSIS — I951 Orthostatic hypotension: Secondary | ICD-10-CM | POA: Diagnosis not present

## 2012-05-13 DIAGNOSIS — D510 Vitamin B12 deficiency anemia due to intrinsic factor deficiency: Secondary | ICD-10-CM | POA: Diagnosis not present

## 2012-05-16 DIAGNOSIS — I951 Orthostatic hypotension: Secondary | ICD-10-CM | POA: Diagnosis not present

## 2012-05-16 DIAGNOSIS — E119 Type 2 diabetes mellitus without complications: Secondary | ICD-10-CM | POA: Diagnosis not present

## 2012-05-16 DIAGNOSIS — I4891 Unspecified atrial fibrillation: Secondary | ICD-10-CM | POA: Diagnosis not present

## 2012-05-16 DIAGNOSIS — D510 Vitamin B12 deficiency anemia due to intrinsic factor deficiency: Secondary | ICD-10-CM | POA: Diagnosis not present

## 2012-05-17 DIAGNOSIS — D510 Vitamin B12 deficiency anemia due to intrinsic factor deficiency: Secondary | ICD-10-CM | POA: Diagnosis not present

## 2012-05-17 DIAGNOSIS — I951 Orthostatic hypotension: Secondary | ICD-10-CM | POA: Diagnosis not present

## 2012-05-17 DIAGNOSIS — E119 Type 2 diabetes mellitus without complications: Secondary | ICD-10-CM | POA: Diagnosis not present

## 2012-05-17 DIAGNOSIS — I4891 Unspecified atrial fibrillation: Secondary | ICD-10-CM | POA: Diagnosis not present

## 2012-05-19 DIAGNOSIS — I4891 Unspecified atrial fibrillation: Secondary | ICD-10-CM | POA: Diagnosis not present

## 2012-05-19 DIAGNOSIS — E119 Type 2 diabetes mellitus without complications: Secondary | ICD-10-CM | POA: Diagnosis not present

## 2012-05-19 DIAGNOSIS — D510 Vitamin B12 deficiency anemia due to intrinsic factor deficiency: Secondary | ICD-10-CM | POA: Diagnosis not present

## 2012-05-19 DIAGNOSIS — I951 Orthostatic hypotension: Secondary | ICD-10-CM | POA: Diagnosis not present

## 2012-05-23 DIAGNOSIS — E119 Type 2 diabetes mellitus without complications: Secondary | ICD-10-CM | POA: Diagnosis not present

## 2012-05-23 DIAGNOSIS — D510 Vitamin B12 deficiency anemia due to intrinsic factor deficiency: Secondary | ICD-10-CM | POA: Diagnosis not present

## 2012-05-23 DIAGNOSIS — I951 Orthostatic hypotension: Secondary | ICD-10-CM | POA: Diagnosis not present

## 2012-05-23 DIAGNOSIS — I4891 Unspecified atrial fibrillation: Secondary | ICD-10-CM | POA: Diagnosis not present

## 2012-05-25 DIAGNOSIS — I951 Orthostatic hypotension: Secondary | ICD-10-CM | POA: Diagnosis not present

## 2012-05-25 DIAGNOSIS — E119 Type 2 diabetes mellitus without complications: Secondary | ICD-10-CM | POA: Diagnosis not present

## 2012-05-25 DIAGNOSIS — I4891 Unspecified atrial fibrillation: Secondary | ICD-10-CM | POA: Diagnosis not present

## 2012-05-25 DIAGNOSIS — D510 Vitamin B12 deficiency anemia due to intrinsic factor deficiency: Secondary | ICD-10-CM | POA: Diagnosis not present

## 2012-05-26 DIAGNOSIS — I951 Orthostatic hypotension: Secondary | ICD-10-CM | POA: Diagnosis not present

## 2012-05-26 DIAGNOSIS — D510 Vitamin B12 deficiency anemia due to intrinsic factor deficiency: Secondary | ICD-10-CM | POA: Diagnosis not present

## 2012-05-26 DIAGNOSIS — I4891 Unspecified atrial fibrillation: Secondary | ICD-10-CM | POA: Diagnosis not present

## 2012-05-26 DIAGNOSIS — E119 Type 2 diabetes mellitus without complications: Secondary | ICD-10-CM | POA: Diagnosis not present

## 2012-05-27 DIAGNOSIS — I951 Orthostatic hypotension: Secondary | ICD-10-CM | POA: Diagnosis not present

## 2012-05-27 DIAGNOSIS — D510 Vitamin B12 deficiency anemia due to intrinsic factor deficiency: Secondary | ICD-10-CM | POA: Diagnosis not present

## 2012-05-27 DIAGNOSIS — E119 Type 2 diabetes mellitus without complications: Secondary | ICD-10-CM | POA: Diagnosis not present

## 2012-05-27 DIAGNOSIS — I4891 Unspecified atrial fibrillation: Secondary | ICD-10-CM | POA: Diagnosis not present

## 2012-05-30 DIAGNOSIS — I951 Orthostatic hypotension: Secondary | ICD-10-CM | POA: Diagnosis not present

## 2012-05-30 DIAGNOSIS — D510 Vitamin B12 deficiency anemia due to intrinsic factor deficiency: Secondary | ICD-10-CM | POA: Diagnosis not present

## 2012-05-30 DIAGNOSIS — I4891 Unspecified atrial fibrillation: Secondary | ICD-10-CM | POA: Diagnosis not present

## 2012-05-30 DIAGNOSIS — E119 Type 2 diabetes mellitus without complications: Secondary | ICD-10-CM | POA: Diagnosis not present

## 2012-05-31 DIAGNOSIS — E7800 Pure hypercholesterolemia, unspecified: Secondary | ICD-10-CM | POA: Diagnosis not present

## 2012-05-31 DIAGNOSIS — I10 Essential (primary) hypertension: Secondary | ICD-10-CM | POA: Diagnosis not present

## 2012-05-31 DIAGNOSIS — E785 Hyperlipidemia, unspecified: Secondary | ICD-10-CM | POA: Diagnosis not present

## 2012-05-31 DIAGNOSIS — E119 Type 2 diabetes mellitus without complications: Secondary | ICD-10-CM | POA: Diagnosis not present

## 2012-06-01 DIAGNOSIS — I951 Orthostatic hypotension: Secondary | ICD-10-CM | POA: Diagnosis not present

## 2012-06-01 DIAGNOSIS — I4891 Unspecified atrial fibrillation: Secondary | ICD-10-CM | POA: Diagnosis not present

## 2012-06-01 DIAGNOSIS — D510 Vitamin B12 deficiency anemia due to intrinsic factor deficiency: Secondary | ICD-10-CM | POA: Diagnosis not present

## 2012-06-01 DIAGNOSIS — E119 Type 2 diabetes mellitus without complications: Secondary | ICD-10-CM | POA: Diagnosis not present

## 2012-06-03 DIAGNOSIS — I951 Orthostatic hypotension: Secondary | ICD-10-CM | POA: Diagnosis not present

## 2012-06-03 DIAGNOSIS — E119 Type 2 diabetes mellitus without complications: Secondary | ICD-10-CM | POA: Diagnosis not present

## 2012-06-03 DIAGNOSIS — I4891 Unspecified atrial fibrillation: Secondary | ICD-10-CM | POA: Diagnosis not present

## 2012-06-03 DIAGNOSIS — D510 Vitamin B12 deficiency anemia due to intrinsic factor deficiency: Secondary | ICD-10-CM | POA: Diagnosis not present

## 2012-06-06 DIAGNOSIS — I4891 Unspecified atrial fibrillation: Secondary | ICD-10-CM | POA: Diagnosis not present

## 2012-06-06 DIAGNOSIS — E119 Type 2 diabetes mellitus without complications: Secondary | ICD-10-CM | POA: Diagnosis not present

## 2012-06-06 DIAGNOSIS — I951 Orthostatic hypotension: Secondary | ICD-10-CM | POA: Diagnosis not present

## 2012-06-06 DIAGNOSIS — D510 Vitamin B12 deficiency anemia due to intrinsic factor deficiency: Secondary | ICD-10-CM | POA: Diagnosis not present

## 2012-06-08 DIAGNOSIS — D510 Vitamin B12 deficiency anemia due to intrinsic factor deficiency: Secondary | ICD-10-CM | POA: Diagnosis not present

## 2012-06-08 DIAGNOSIS — I951 Orthostatic hypotension: Secondary | ICD-10-CM | POA: Diagnosis not present

## 2012-06-10 DIAGNOSIS — I4891 Unspecified atrial fibrillation: Secondary | ICD-10-CM | POA: Diagnosis not present

## 2012-06-10 DIAGNOSIS — E119 Type 2 diabetes mellitus without complications: Secondary | ICD-10-CM | POA: Diagnosis not present

## 2012-06-10 DIAGNOSIS — D510 Vitamin B12 deficiency anemia due to intrinsic factor deficiency: Secondary | ICD-10-CM | POA: Diagnosis not present

## 2012-06-10 DIAGNOSIS — I951 Orthostatic hypotension: Secondary | ICD-10-CM | POA: Diagnosis not present

## 2012-06-13 DIAGNOSIS — I951 Orthostatic hypotension: Secondary | ICD-10-CM | POA: Diagnosis not present

## 2012-06-15 DIAGNOSIS — D510 Vitamin B12 deficiency anemia due to intrinsic factor deficiency: Secondary | ICD-10-CM | POA: Diagnosis not present

## 2012-06-15 DIAGNOSIS — I4891 Unspecified atrial fibrillation: Secondary | ICD-10-CM | POA: Diagnosis not present

## 2012-06-15 DIAGNOSIS — E119 Type 2 diabetes mellitus without complications: Secondary | ICD-10-CM | POA: Diagnosis not present

## 2012-06-15 DIAGNOSIS — I951 Orthostatic hypotension: Secondary | ICD-10-CM | POA: Diagnosis not present

## 2012-06-17 DIAGNOSIS — E119 Type 2 diabetes mellitus without complications: Secondary | ICD-10-CM | POA: Diagnosis not present

## 2012-06-17 DIAGNOSIS — I4891 Unspecified atrial fibrillation: Secondary | ICD-10-CM | POA: Diagnosis not present

## 2012-06-17 DIAGNOSIS — D510 Vitamin B12 deficiency anemia due to intrinsic factor deficiency: Secondary | ICD-10-CM | POA: Diagnosis not present

## 2012-06-17 DIAGNOSIS — I951 Orthostatic hypotension: Secondary | ICD-10-CM | POA: Diagnosis not present

## 2012-06-20 DIAGNOSIS — D510 Vitamin B12 deficiency anemia due to intrinsic factor deficiency: Secondary | ICD-10-CM | POA: Diagnosis not present

## 2012-06-20 DIAGNOSIS — E119 Type 2 diabetes mellitus without complications: Secondary | ICD-10-CM | POA: Diagnosis not present

## 2012-06-20 DIAGNOSIS — I951 Orthostatic hypotension: Secondary | ICD-10-CM | POA: Diagnosis not present

## 2012-06-20 DIAGNOSIS — I4891 Unspecified atrial fibrillation: Secondary | ICD-10-CM | POA: Diagnosis not present

## 2012-06-22 DIAGNOSIS — I951 Orthostatic hypotension: Secondary | ICD-10-CM | POA: Diagnosis not present

## 2012-06-22 DIAGNOSIS — I4891 Unspecified atrial fibrillation: Secondary | ICD-10-CM | POA: Diagnosis not present

## 2012-06-22 DIAGNOSIS — E119 Type 2 diabetes mellitus without complications: Secondary | ICD-10-CM | POA: Diagnosis not present

## 2012-06-22 DIAGNOSIS — D510 Vitamin B12 deficiency anemia due to intrinsic factor deficiency: Secondary | ICD-10-CM | POA: Diagnosis not present

## 2012-06-22 DIAGNOSIS — B351 Tinea unguium: Secondary | ICD-10-CM | POA: Diagnosis not present

## 2012-06-23 DIAGNOSIS — E119 Type 2 diabetes mellitus without complications: Secondary | ICD-10-CM | POA: Diagnosis not present

## 2012-06-25 DIAGNOSIS — H4011X0 Primary open-angle glaucoma, stage unspecified: Secondary | ICD-10-CM | POA: Diagnosis not present

## 2012-06-25 DIAGNOSIS — H409 Unspecified glaucoma: Secondary | ICD-10-CM | POA: Diagnosis not present

## 2012-06-25 DIAGNOSIS — H269 Unspecified cataract: Secondary | ICD-10-CM | POA: Diagnosis not present

## 2012-06-27 DIAGNOSIS — I4891 Unspecified atrial fibrillation: Secondary | ICD-10-CM | POA: Diagnosis not present

## 2012-06-27 DIAGNOSIS — I951 Orthostatic hypotension: Secondary | ICD-10-CM | POA: Diagnosis not present

## 2012-06-27 DIAGNOSIS — D510 Vitamin B12 deficiency anemia due to intrinsic factor deficiency: Secondary | ICD-10-CM | POA: Diagnosis not present

## 2012-06-27 DIAGNOSIS — E119 Type 2 diabetes mellitus without complications: Secondary | ICD-10-CM | POA: Diagnosis not present

## 2012-06-29 DIAGNOSIS — I4891 Unspecified atrial fibrillation: Secondary | ICD-10-CM | POA: Diagnosis not present

## 2012-06-29 DIAGNOSIS — I951 Orthostatic hypotension: Secondary | ICD-10-CM | POA: Diagnosis not present

## 2012-07-01 DIAGNOSIS — E119 Type 2 diabetes mellitus without complications: Secondary | ICD-10-CM | POA: Diagnosis not present

## 2012-07-01 DIAGNOSIS — I4891 Unspecified atrial fibrillation: Secondary | ICD-10-CM | POA: Diagnosis not present

## 2012-07-01 DIAGNOSIS — D510 Vitamin B12 deficiency anemia due to intrinsic factor deficiency: Secondary | ICD-10-CM | POA: Diagnosis not present

## 2012-07-01 DIAGNOSIS — I951 Orthostatic hypotension: Secondary | ICD-10-CM | POA: Diagnosis not present

## 2012-07-04 DIAGNOSIS — D510 Vitamin B12 deficiency anemia due to intrinsic factor deficiency: Secondary | ICD-10-CM | POA: Diagnosis not present

## 2012-07-04 DIAGNOSIS — E119 Type 2 diabetes mellitus without complications: Secondary | ICD-10-CM | POA: Diagnosis not present

## 2012-07-04 DIAGNOSIS — I4891 Unspecified atrial fibrillation: Secondary | ICD-10-CM | POA: Diagnosis not present

## 2012-07-04 DIAGNOSIS — I951 Orthostatic hypotension: Secondary | ICD-10-CM | POA: Diagnosis not present

## 2012-07-06 DIAGNOSIS — E119 Type 2 diabetes mellitus without complications: Secondary | ICD-10-CM | POA: Diagnosis not present

## 2012-07-06 DIAGNOSIS — I4891 Unspecified atrial fibrillation: Secondary | ICD-10-CM | POA: Diagnosis not present

## 2012-07-06 DIAGNOSIS — D510 Vitamin B12 deficiency anemia due to intrinsic factor deficiency: Secondary | ICD-10-CM | POA: Diagnosis not present

## 2012-07-06 DIAGNOSIS — I951 Orthostatic hypotension: Secondary | ICD-10-CM | POA: Diagnosis not present

## 2012-07-08 DIAGNOSIS — D510 Vitamin B12 deficiency anemia due to intrinsic factor deficiency: Secondary | ICD-10-CM | POA: Diagnosis not present

## 2012-07-08 DIAGNOSIS — E119 Type 2 diabetes mellitus without complications: Secondary | ICD-10-CM | POA: Diagnosis not present

## 2012-07-08 DIAGNOSIS — I4891 Unspecified atrial fibrillation: Secondary | ICD-10-CM | POA: Diagnosis not present

## 2012-07-08 DIAGNOSIS — I951 Orthostatic hypotension: Secondary | ICD-10-CM | POA: Diagnosis not present

## 2012-07-11 DIAGNOSIS — E119 Type 2 diabetes mellitus without complications: Secondary | ICD-10-CM | POA: Diagnosis not present

## 2012-07-11 DIAGNOSIS — I4891 Unspecified atrial fibrillation: Secondary | ICD-10-CM | POA: Diagnosis not present

## 2012-07-11 DIAGNOSIS — D510 Vitamin B12 deficiency anemia due to intrinsic factor deficiency: Secondary | ICD-10-CM | POA: Diagnosis not present

## 2012-07-11 DIAGNOSIS — I951 Orthostatic hypotension: Secondary | ICD-10-CM | POA: Diagnosis not present

## 2012-07-13 DIAGNOSIS — E119 Type 2 diabetes mellitus without complications: Secondary | ICD-10-CM | POA: Diagnosis not present

## 2012-07-13 DIAGNOSIS — I4891 Unspecified atrial fibrillation: Secondary | ICD-10-CM | POA: Diagnosis not present

## 2012-07-13 DIAGNOSIS — D510 Vitamin B12 deficiency anemia due to intrinsic factor deficiency: Secondary | ICD-10-CM | POA: Diagnosis not present

## 2012-07-13 DIAGNOSIS — I951 Orthostatic hypotension: Secondary | ICD-10-CM | POA: Diagnosis not present

## 2012-07-15 DIAGNOSIS — I951 Orthostatic hypotension: Secondary | ICD-10-CM | POA: Diagnosis not present

## 2012-07-15 DIAGNOSIS — I4891 Unspecified atrial fibrillation: Secondary | ICD-10-CM | POA: Diagnosis not present

## 2012-07-15 DIAGNOSIS — D510 Vitamin B12 deficiency anemia due to intrinsic factor deficiency: Secondary | ICD-10-CM | POA: Diagnosis not present

## 2012-07-15 DIAGNOSIS — E119 Type 2 diabetes mellitus without complications: Secondary | ICD-10-CM | POA: Diagnosis not present

## 2012-07-18 DIAGNOSIS — D510 Vitamin B12 deficiency anemia due to intrinsic factor deficiency: Secondary | ICD-10-CM | POA: Diagnosis not present

## 2012-07-18 DIAGNOSIS — I4891 Unspecified atrial fibrillation: Secondary | ICD-10-CM | POA: Diagnosis not present

## 2012-07-18 DIAGNOSIS — I951 Orthostatic hypotension: Secondary | ICD-10-CM | POA: Diagnosis not present

## 2012-07-18 DIAGNOSIS — E119 Type 2 diabetes mellitus without complications: Secondary | ICD-10-CM | POA: Diagnosis not present

## 2012-07-20 DIAGNOSIS — I4891 Unspecified atrial fibrillation: Secondary | ICD-10-CM | POA: Diagnosis not present

## 2012-07-20 DIAGNOSIS — D510 Vitamin B12 deficiency anemia due to intrinsic factor deficiency: Secondary | ICD-10-CM | POA: Diagnosis not present

## 2012-07-20 DIAGNOSIS — I951 Orthostatic hypotension: Secondary | ICD-10-CM | POA: Diagnosis not present

## 2012-07-20 DIAGNOSIS — E119 Type 2 diabetes mellitus without complications: Secondary | ICD-10-CM | POA: Diagnosis not present

## 2012-07-22 DIAGNOSIS — I951 Orthostatic hypotension: Secondary | ICD-10-CM | POA: Diagnosis not present

## 2012-07-22 DIAGNOSIS — D510 Vitamin B12 deficiency anemia due to intrinsic factor deficiency: Secondary | ICD-10-CM | POA: Diagnosis not present

## 2012-07-22 DIAGNOSIS — I4891 Unspecified atrial fibrillation: Secondary | ICD-10-CM | POA: Diagnosis not present

## 2012-07-22 DIAGNOSIS — E119 Type 2 diabetes mellitus without complications: Secondary | ICD-10-CM | POA: Diagnosis not present

## 2012-07-25 DIAGNOSIS — E119 Type 2 diabetes mellitus without complications: Secondary | ICD-10-CM | POA: Diagnosis not present

## 2012-07-25 DIAGNOSIS — D510 Vitamin B12 deficiency anemia due to intrinsic factor deficiency: Secondary | ICD-10-CM | POA: Diagnosis not present

## 2012-07-25 DIAGNOSIS — I951 Orthostatic hypotension: Secondary | ICD-10-CM | POA: Diagnosis not present

## 2012-07-25 DIAGNOSIS — I4891 Unspecified atrial fibrillation: Secondary | ICD-10-CM | POA: Diagnosis not present

## 2012-07-26 DIAGNOSIS — M7989 Other specified soft tissue disorders: Secondary | ICD-10-CM | POA: Diagnosis not present

## 2012-07-26 DIAGNOSIS — I8310 Varicose veins of unspecified lower extremity with inflammation: Secondary | ICD-10-CM | POA: Diagnosis not present

## 2012-07-26 DIAGNOSIS — I6529 Occlusion and stenosis of unspecified carotid artery: Secondary | ICD-10-CM | POA: Diagnosis not present

## 2012-07-27 DIAGNOSIS — I4891 Unspecified atrial fibrillation: Secondary | ICD-10-CM | POA: Diagnosis not present

## 2012-07-27 DIAGNOSIS — I951 Orthostatic hypotension: Secondary | ICD-10-CM | POA: Diagnosis not present

## 2012-07-27 DIAGNOSIS — D510 Vitamin B12 deficiency anemia due to intrinsic factor deficiency: Secondary | ICD-10-CM | POA: Diagnosis not present

## 2012-07-27 DIAGNOSIS — E119 Type 2 diabetes mellitus without complications: Secondary | ICD-10-CM | POA: Diagnosis not present

## 2012-07-29 DIAGNOSIS — D510 Vitamin B12 deficiency anemia due to intrinsic factor deficiency: Secondary | ICD-10-CM | POA: Diagnosis not present

## 2012-07-29 DIAGNOSIS — E119 Type 2 diabetes mellitus without complications: Secondary | ICD-10-CM | POA: Diagnosis not present

## 2012-07-29 DIAGNOSIS — I4891 Unspecified atrial fibrillation: Secondary | ICD-10-CM | POA: Diagnosis not present

## 2012-07-29 DIAGNOSIS — I951 Orthostatic hypotension: Secondary | ICD-10-CM | POA: Diagnosis not present

## 2012-08-01 DIAGNOSIS — E119 Type 2 diabetes mellitus without complications: Secondary | ICD-10-CM | POA: Diagnosis not present

## 2012-08-01 DIAGNOSIS — I4891 Unspecified atrial fibrillation: Secondary | ICD-10-CM | POA: Diagnosis not present

## 2012-08-01 DIAGNOSIS — D510 Vitamin B12 deficiency anemia due to intrinsic factor deficiency: Secondary | ICD-10-CM | POA: Diagnosis not present

## 2012-08-01 DIAGNOSIS — I951 Orthostatic hypotension: Secondary | ICD-10-CM | POA: Diagnosis not present

## 2012-08-02 DIAGNOSIS — I951 Orthostatic hypotension: Secondary | ICD-10-CM | POA: Diagnosis not present

## 2012-08-02 DIAGNOSIS — I4891 Unspecified atrial fibrillation: Secondary | ICD-10-CM | POA: Diagnosis not present

## 2012-08-02 DIAGNOSIS — D510 Vitamin B12 deficiency anemia due to intrinsic factor deficiency: Secondary | ICD-10-CM | POA: Diagnosis not present

## 2012-08-02 DIAGNOSIS — E119 Type 2 diabetes mellitus without complications: Secondary | ICD-10-CM | POA: Diagnosis not present

## 2012-08-03 DIAGNOSIS — I4891 Unspecified atrial fibrillation: Secondary | ICD-10-CM | POA: Diagnosis not present

## 2012-08-03 DIAGNOSIS — E119 Type 2 diabetes mellitus without complications: Secondary | ICD-10-CM | POA: Diagnosis not present

## 2012-08-03 DIAGNOSIS — D510 Vitamin B12 deficiency anemia due to intrinsic factor deficiency: Secondary | ICD-10-CM | POA: Diagnosis not present

## 2012-08-03 DIAGNOSIS — I951 Orthostatic hypotension: Secondary | ICD-10-CM | POA: Diagnosis not present

## 2012-08-05 DIAGNOSIS — D510 Vitamin B12 deficiency anemia due to intrinsic factor deficiency: Secondary | ICD-10-CM | POA: Diagnosis not present

## 2012-08-05 DIAGNOSIS — I951 Orthostatic hypotension: Secondary | ICD-10-CM | POA: Diagnosis not present

## 2012-08-05 DIAGNOSIS — E119 Type 2 diabetes mellitus without complications: Secondary | ICD-10-CM | POA: Diagnosis not present

## 2012-08-05 DIAGNOSIS — I4891 Unspecified atrial fibrillation: Secondary | ICD-10-CM | POA: Diagnosis not present

## 2012-08-08 DIAGNOSIS — I4891 Unspecified atrial fibrillation: Secondary | ICD-10-CM | POA: Diagnosis not present

## 2012-08-08 DIAGNOSIS — D510 Vitamin B12 deficiency anemia due to intrinsic factor deficiency: Secondary | ICD-10-CM | POA: Diagnosis not present

## 2012-08-08 DIAGNOSIS — I951 Orthostatic hypotension: Secondary | ICD-10-CM | POA: Diagnosis not present

## 2012-08-08 DIAGNOSIS — E119 Type 2 diabetes mellitus without complications: Secondary | ICD-10-CM | POA: Diagnosis not present

## 2012-08-10 DIAGNOSIS — I951 Orthostatic hypotension: Secondary | ICD-10-CM | POA: Diagnosis not present

## 2012-08-10 DIAGNOSIS — E119 Type 2 diabetes mellitus without complications: Secondary | ICD-10-CM | POA: Diagnosis not present

## 2012-08-10 DIAGNOSIS — I4891 Unspecified atrial fibrillation: Secondary | ICD-10-CM | POA: Diagnosis not present

## 2012-08-10 DIAGNOSIS — D510 Vitamin B12 deficiency anemia due to intrinsic factor deficiency: Secondary | ICD-10-CM | POA: Diagnosis not present

## 2012-08-12 DIAGNOSIS — I4891 Unspecified atrial fibrillation: Secondary | ICD-10-CM | POA: Diagnosis not present

## 2012-08-12 DIAGNOSIS — E119 Type 2 diabetes mellitus without complications: Secondary | ICD-10-CM | POA: Diagnosis not present

## 2012-08-12 DIAGNOSIS — D510 Vitamin B12 deficiency anemia due to intrinsic factor deficiency: Secondary | ICD-10-CM | POA: Diagnosis not present

## 2012-08-12 DIAGNOSIS — I951 Orthostatic hypotension: Secondary | ICD-10-CM | POA: Diagnosis not present

## 2012-08-15 DIAGNOSIS — I951 Orthostatic hypotension: Secondary | ICD-10-CM | POA: Diagnosis not present

## 2012-08-15 DIAGNOSIS — E119 Type 2 diabetes mellitus without complications: Secondary | ICD-10-CM | POA: Diagnosis not present

## 2012-08-15 DIAGNOSIS — D510 Vitamin B12 deficiency anemia due to intrinsic factor deficiency: Secondary | ICD-10-CM | POA: Diagnosis not present

## 2012-08-15 DIAGNOSIS — I4891 Unspecified atrial fibrillation: Secondary | ICD-10-CM | POA: Diagnosis not present

## 2012-08-17 DIAGNOSIS — E119 Type 2 diabetes mellitus without complications: Secondary | ICD-10-CM | POA: Diagnosis not present

## 2012-08-17 DIAGNOSIS — I4891 Unspecified atrial fibrillation: Secondary | ICD-10-CM | POA: Diagnosis not present

## 2012-08-17 DIAGNOSIS — I951 Orthostatic hypotension: Secondary | ICD-10-CM | POA: Diagnosis not present

## 2012-08-17 DIAGNOSIS — D510 Vitamin B12 deficiency anemia due to intrinsic factor deficiency: Secondary | ICD-10-CM | POA: Diagnosis not present

## 2012-08-19 DIAGNOSIS — I951 Orthostatic hypotension: Secondary | ICD-10-CM | POA: Diagnosis not present

## 2012-08-19 DIAGNOSIS — D510 Vitamin B12 deficiency anemia due to intrinsic factor deficiency: Secondary | ICD-10-CM | POA: Diagnosis not present

## 2012-08-19 DIAGNOSIS — I4891 Unspecified atrial fibrillation: Secondary | ICD-10-CM | POA: Diagnosis not present

## 2012-08-19 DIAGNOSIS — E119 Type 2 diabetes mellitus without complications: Secondary | ICD-10-CM | POA: Diagnosis not present

## 2012-08-22 DIAGNOSIS — I4891 Unspecified atrial fibrillation: Secondary | ICD-10-CM | POA: Diagnosis not present

## 2012-08-22 DIAGNOSIS — I951 Orthostatic hypotension: Secondary | ICD-10-CM | POA: Diagnosis not present

## 2012-08-22 DIAGNOSIS — D510 Vitamin B12 deficiency anemia due to intrinsic factor deficiency: Secondary | ICD-10-CM | POA: Diagnosis not present

## 2012-08-22 DIAGNOSIS — E119 Type 2 diabetes mellitus without complications: Secondary | ICD-10-CM | POA: Diagnosis not present

## 2012-08-24 DIAGNOSIS — E119 Type 2 diabetes mellitus without complications: Secondary | ICD-10-CM | POA: Diagnosis not present

## 2012-08-24 DIAGNOSIS — L0390 Cellulitis, unspecified: Secondary | ICD-10-CM | POA: Diagnosis not present

## 2012-08-24 DIAGNOSIS — L02219 Cutaneous abscess of trunk, unspecified: Secondary | ICD-10-CM | POA: Diagnosis not present

## 2012-08-24 DIAGNOSIS — D510 Vitamin B12 deficiency anemia due to intrinsic factor deficiency: Secondary | ICD-10-CM | POA: Diagnosis not present

## 2012-08-24 DIAGNOSIS — I4891 Unspecified atrial fibrillation: Secondary | ICD-10-CM | POA: Diagnosis not present

## 2012-08-24 DIAGNOSIS — I951 Orthostatic hypotension: Secondary | ICD-10-CM | POA: Diagnosis not present

## 2012-08-25 DIAGNOSIS — I4891 Unspecified atrial fibrillation: Secondary | ICD-10-CM | POA: Diagnosis not present

## 2012-08-25 DIAGNOSIS — D510 Vitamin B12 deficiency anemia due to intrinsic factor deficiency: Secondary | ICD-10-CM | POA: Diagnosis not present

## 2012-08-25 DIAGNOSIS — E119 Type 2 diabetes mellitus without complications: Secondary | ICD-10-CM | POA: Diagnosis not present

## 2012-08-25 DIAGNOSIS — I951 Orthostatic hypotension: Secondary | ICD-10-CM | POA: Diagnosis not present

## 2012-08-26 DIAGNOSIS — D510 Vitamin B12 deficiency anemia due to intrinsic factor deficiency: Secondary | ICD-10-CM | POA: Diagnosis not present

## 2012-08-26 DIAGNOSIS — I951 Orthostatic hypotension: Secondary | ICD-10-CM | POA: Diagnosis not present

## 2012-08-26 DIAGNOSIS — E119 Type 2 diabetes mellitus without complications: Secondary | ICD-10-CM | POA: Diagnosis not present

## 2012-08-26 DIAGNOSIS — I4891 Unspecified atrial fibrillation: Secondary | ICD-10-CM | POA: Diagnosis not present

## 2012-08-27 DIAGNOSIS — D510 Vitamin B12 deficiency anemia due to intrinsic factor deficiency: Secondary | ICD-10-CM | POA: Diagnosis not present

## 2012-08-27 DIAGNOSIS — E119 Type 2 diabetes mellitus without complications: Secondary | ICD-10-CM | POA: Diagnosis not present

## 2012-08-27 DIAGNOSIS — I951 Orthostatic hypotension: Secondary | ICD-10-CM | POA: Diagnosis not present

## 2012-08-27 DIAGNOSIS — I4891 Unspecified atrial fibrillation: Secondary | ICD-10-CM | POA: Diagnosis not present

## 2012-08-29 DIAGNOSIS — I4891 Unspecified atrial fibrillation: Secondary | ICD-10-CM | POA: Diagnosis not present

## 2012-08-29 DIAGNOSIS — I951 Orthostatic hypotension: Secondary | ICD-10-CM | POA: Diagnosis not present

## 2012-08-29 DIAGNOSIS — D510 Vitamin B12 deficiency anemia due to intrinsic factor deficiency: Secondary | ICD-10-CM | POA: Diagnosis not present

## 2012-08-29 DIAGNOSIS — E119 Type 2 diabetes mellitus without complications: Secondary | ICD-10-CM | POA: Diagnosis not present

## 2012-08-31 DIAGNOSIS — E119 Type 2 diabetes mellitus without complications: Secondary | ICD-10-CM | POA: Diagnosis not present

## 2012-08-31 DIAGNOSIS — I951 Orthostatic hypotension: Secondary | ICD-10-CM | POA: Diagnosis not present

## 2012-08-31 DIAGNOSIS — I4891 Unspecified atrial fibrillation: Secondary | ICD-10-CM | POA: Diagnosis not present

## 2012-08-31 DIAGNOSIS — D510 Vitamin B12 deficiency anemia due to intrinsic factor deficiency: Secondary | ICD-10-CM | POA: Diagnosis not present

## 2012-09-01 DIAGNOSIS — I951 Orthostatic hypotension: Secondary | ICD-10-CM | POA: Diagnosis not present

## 2012-09-01 DIAGNOSIS — D510 Vitamin B12 deficiency anemia due to intrinsic factor deficiency: Secondary | ICD-10-CM | POA: Diagnosis not present

## 2012-09-01 DIAGNOSIS — E119 Type 2 diabetes mellitus without complications: Secondary | ICD-10-CM | POA: Diagnosis not present

## 2012-09-01 DIAGNOSIS — I4891 Unspecified atrial fibrillation: Secondary | ICD-10-CM | POA: Diagnosis not present

## 2012-09-02 DIAGNOSIS — I951 Orthostatic hypotension: Secondary | ICD-10-CM | POA: Diagnosis not present

## 2012-09-02 DIAGNOSIS — E119 Type 2 diabetes mellitus without complications: Secondary | ICD-10-CM | POA: Diagnosis not present

## 2012-09-02 DIAGNOSIS — D510 Vitamin B12 deficiency anemia due to intrinsic factor deficiency: Secondary | ICD-10-CM | POA: Diagnosis not present

## 2012-09-02 DIAGNOSIS — I4891 Unspecified atrial fibrillation: Secondary | ICD-10-CM | POA: Diagnosis not present

## 2012-09-03 DIAGNOSIS — E119 Type 2 diabetes mellitus without complications: Secondary | ICD-10-CM | POA: Diagnosis not present

## 2012-09-03 DIAGNOSIS — D510 Vitamin B12 deficiency anemia due to intrinsic factor deficiency: Secondary | ICD-10-CM | POA: Diagnosis not present

## 2012-09-03 DIAGNOSIS — I4891 Unspecified atrial fibrillation: Secondary | ICD-10-CM | POA: Diagnosis not present

## 2012-09-03 DIAGNOSIS — I951 Orthostatic hypotension: Secondary | ICD-10-CM | POA: Diagnosis not present

## 2012-09-05 DIAGNOSIS — I4891 Unspecified atrial fibrillation: Secondary | ICD-10-CM | POA: Diagnosis not present

## 2012-09-05 DIAGNOSIS — I10 Essential (primary) hypertension: Secondary | ICD-10-CM | POA: Diagnosis not present

## 2012-09-05 DIAGNOSIS — E119 Type 2 diabetes mellitus without complications: Secondary | ICD-10-CM | POA: Diagnosis not present

## 2012-09-05 DIAGNOSIS — E785 Hyperlipidemia, unspecified: Secondary | ICD-10-CM | POA: Diagnosis not present

## 2012-09-05 DIAGNOSIS — D510 Vitamin B12 deficiency anemia due to intrinsic factor deficiency: Secondary | ICD-10-CM | POA: Diagnosis not present

## 2012-09-05 DIAGNOSIS — E039 Hypothyroidism, unspecified: Secondary | ICD-10-CM | POA: Diagnosis not present

## 2012-09-05 DIAGNOSIS — L723 Sebaceous cyst: Secondary | ICD-10-CM | POA: Diagnosis not present

## 2012-09-05 DIAGNOSIS — Z0000 Encounter for general adult medical examination without abnormal findings: Secondary | ICD-10-CM | POA: Diagnosis not present

## 2012-09-05 DIAGNOSIS — I951 Orthostatic hypotension: Secondary | ICD-10-CM | POA: Diagnosis not present

## 2012-09-07 DIAGNOSIS — I4891 Unspecified atrial fibrillation: Secondary | ICD-10-CM | POA: Diagnosis not present

## 2012-09-07 DIAGNOSIS — D510 Vitamin B12 deficiency anemia due to intrinsic factor deficiency: Secondary | ICD-10-CM | POA: Diagnosis not present

## 2012-09-07 DIAGNOSIS — E119 Type 2 diabetes mellitus without complications: Secondary | ICD-10-CM | POA: Diagnosis not present

## 2012-09-07 DIAGNOSIS — I951 Orthostatic hypotension: Secondary | ICD-10-CM | POA: Diagnosis not present

## 2012-09-09 DIAGNOSIS — D510 Vitamin B12 deficiency anemia due to intrinsic factor deficiency: Secondary | ICD-10-CM | POA: Diagnosis not present

## 2012-09-09 DIAGNOSIS — E119 Type 2 diabetes mellitus without complications: Secondary | ICD-10-CM | POA: Diagnosis not present

## 2012-09-09 DIAGNOSIS — I4891 Unspecified atrial fibrillation: Secondary | ICD-10-CM | POA: Diagnosis not present

## 2012-09-09 DIAGNOSIS — I951 Orthostatic hypotension: Secondary | ICD-10-CM | POA: Diagnosis not present

## 2012-09-11 DIAGNOSIS — D510 Vitamin B12 deficiency anemia due to intrinsic factor deficiency: Secondary | ICD-10-CM | POA: Diagnosis not present

## 2012-09-11 DIAGNOSIS — I951 Orthostatic hypotension: Secondary | ICD-10-CM | POA: Diagnosis not present

## 2012-09-11 DIAGNOSIS — I4891 Unspecified atrial fibrillation: Secondary | ICD-10-CM | POA: Diagnosis not present

## 2012-09-11 DIAGNOSIS — E119 Type 2 diabetes mellitus without complications: Secondary | ICD-10-CM | POA: Diagnosis not present

## 2012-09-13 DIAGNOSIS — I951 Orthostatic hypotension: Secondary | ICD-10-CM | POA: Diagnosis not present

## 2012-09-13 DIAGNOSIS — E119 Type 2 diabetes mellitus without complications: Secondary | ICD-10-CM | POA: Diagnosis not present

## 2012-09-13 DIAGNOSIS — I4891 Unspecified atrial fibrillation: Secondary | ICD-10-CM | POA: Diagnosis not present

## 2012-09-13 DIAGNOSIS — D510 Vitamin B12 deficiency anemia due to intrinsic factor deficiency: Secondary | ICD-10-CM | POA: Diagnosis not present

## 2012-09-14 DIAGNOSIS — B351 Tinea unguium: Secondary | ICD-10-CM | POA: Diagnosis not present

## 2012-09-14 DIAGNOSIS — D510 Vitamin B12 deficiency anemia due to intrinsic factor deficiency: Secondary | ICD-10-CM | POA: Diagnosis not present

## 2012-09-14 DIAGNOSIS — I951 Orthostatic hypotension: Secondary | ICD-10-CM | POA: Diagnosis not present

## 2012-09-14 DIAGNOSIS — E119 Type 2 diabetes mellitus without complications: Secondary | ICD-10-CM | POA: Diagnosis not present

## 2012-09-14 DIAGNOSIS — I4891 Unspecified atrial fibrillation: Secondary | ICD-10-CM | POA: Diagnosis not present

## 2012-09-16 DIAGNOSIS — E119 Type 2 diabetes mellitus without complications: Secondary | ICD-10-CM | POA: Diagnosis not present

## 2012-09-16 DIAGNOSIS — I951 Orthostatic hypotension: Secondary | ICD-10-CM | POA: Diagnosis not present

## 2012-09-16 DIAGNOSIS — D510 Vitamin B12 deficiency anemia due to intrinsic factor deficiency: Secondary | ICD-10-CM | POA: Diagnosis not present

## 2012-09-16 DIAGNOSIS — I4891 Unspecified atrial fibrillation: Secondary | ICD-10-CM | POA: Diagnosis not present

## 2012-09-22 DIAGNOSIS — L02419 Cutaneous abscess of limb, unspecified: Secondary | ICD-10-CM | POA: Diagnosis not present

## 2012-09-22 DIAGNOSIS — L03119 Cellulitis of unspecified part of limb: Secondary | ICD-10-CM | POA: Diagnosis not present

## 2012-09-23 DIAGNOSIS — I951 Orthostatic hypotension: Secondary | ICD-10-CM | POA: Diagnosis not present

## 2012-09-23 DIAGNOSIS — E119 Type 2 diabetes mellitus without complications: Secondary | ICD-10-CM | POA: Diagnosis not present

## 2012-09-23 DIAGNOSIS — L02219 Cutaneous abscess of trunk, unspecified: Secondary | ICD-10-CM | POA: Diagnosis not present

## 2012-09-23 DIAGNOSIS — D510 Vitamin B12 deficiency anemia due to intrinsic factor deficiency: Secondary | ICD-10-CM | POA: Diagnosis not present

## 2012-09-23 DIAGNOSIS — I4891 Unspecified atrial fibrillation: Secondary | ICD-10-CM | POA: Diagnosis not present

## 2012-09-27 DIAGNOSIS — I4891 Unspecified atrial fibrillation: Secondary | ICD-10-CM | POA: Diagnosis not present

## 2012-09-27 DIAGNOSIS — L02219 Cutaneous abscess of trunk, unspecified: Secondary | ICD-10-CM | POA: Diagnosis not present

## 2012-09-27 DIAGNOSIS — I951 Orthostatic hypotension: Secondary | ICD-10-CM | POA: Diagnosis not present

## 2012-09-27 DIAGNOSIS — E119 Type 2 diabetes mellitus without complications: Secondary | ICD-10-CM | POA: Diagnosis not present

## 2012-09-27 DIAGNOSIS — D510 Vitamin B12 deficiency anemia due to intrinsic factor deficiency: Secondary | ICD-10-CM | POA: Diagnosis not present

## 2012-09-28 DIAGNOSIS — I4891 Unspecified atrial fibrillation: Secondary | ICD-10-CM | POA: Diagnosis not present

## 2012-09-28 DIAGNOSIS — I951 Orthostatic hypotension: Secondary | ICD-10-CM | POA: Diagnosis not present

## 2012-09-28 DIAGNOSIS — E119 Type 2 diabetes mellitus without complications: Secondary | ICD-10-CM | POA: Diagnosis not present

## 2012-09-28 DIAGNOSIS — D510 Vitamin B12 deficiency anemia due to intrinsic factor deficiency: Secondary | ICD-10-CM | POA: Diagnosis not present

## 2012-09-28 DIAGNOSIS — L03319 Cellulitis of trunk, unspecified: Secondary | ICD-10-CM | POA: Diagnosis not present

## 2012-09-29 ENCOUNTER — Ambulatory Visit (INDEPENDENT_AMBULATORY_CARE_PROVIDER_SITE_OTHER): Payer: Medicare Other | Admitting: Cardiovascular Disease

## 2012-09-29 ENCOUNTER — Encounter: Payer: Self-pay | Admitting: Cardiovascular Disease

## 2012-09-29 VITALS — BP 147/81 | HR 95 | Ht 64.0 in | Wt 201.8 lb

## 2012-09-29 DIAGNOSIS — E119 Type 2 diabetes mellitus without complications: Secondary | ICD-10-CM

## 2012-09-29 DIAGNOSIS — I4891 Unspecified atrial fibrillation: Secondary | ICD-10-CM

## 2012-09-29 DIAGNOSIS — I10 Essential (primary) hypertension: Secondary | ICD-10-CM

## 2012-09-29 DIAGNOSIS — I5032 Chronic diastolic (congestive) heart failure: Secondary | ICD-10-CM | POA: Diagnosis not present

## 2012-09-29 DIAGNOSIS — E669 Obesity, unspecified: Secondary | ICD-10-CM

## 2012-09-29 DIAGNOSIS — J449 Chronic obstructive pulmonary disease, unspecified: Secondary | ICD-10-CM | POA: Diagnosis not present

## 2012-09-29 DIAGNOSIS — I509 Heart failure, unspecified: Secondary | ICD-10-CM

## 2012-09-29 MED ORDER — POTASSIUM CHLORIDE ER 10 MEQ PO TBCR: 10.0000 meq | EXTENDED_RELEASE_TABLET | Freq: Every day | ORAL | Status: DC | PRN

## 2012-09-29 MED ORDER — FUROSEMIDE 20 MG PO TABS: 20.0000 mg | ORAL_TABLET | Freq: Every day | ORAL | Status: DC | PRN

## 2012-09-29 NOTE — Assessment & Plan Note (Signed)
Remote history of smoking. Stopped in 1978

## 2012-09-29 NOTE — Assessment & Plan Note (Signed)
Blood pressure borderline high today. She reports having systolic pressures typically in the 120 range at home. No medication changes made

## 2012-09-29 NOTE — Patient Instructions (Addendum)
You are doing well. No medication changes were made.  If you have worsening leg swelling, Take a lasix (diuretic pill) and potassium as needed  Please call us if you have new issues that need to be addressed before your next appt.  Your physician wants you to follow-up in: 6 months.  You will receive a reminder letter in the mail two months in advance. If you don't receive a letter, please call our office to schedule the follow-up appointment.

## 2012-09-29 NOTE — Assessment & Plan Note (Signed)
We have encouraged continued careful diet management in an effort to lose weight. Limited in her ability to exercise

## 2012-09-29 NOTE — Assessment & Plan Note (Addendum)
Chronic lower extremity edema. Blistering of the skin in the posterior aspect. We have offered her a diuretic to take as needed for worsening edema or "leaking"of her blisters. I suspect she has several pounds of extra fluid. We called in Lasix 20 mg daily when necessary and potassium 10 mEq when necessary.

## 2012-09-29 NOTE — Assessment & Plan Note (Signed)
Hemoglobin A1c greater than 7. Encouraged her to watch her diet

## 2012-09-29 NOTE — Assessment & Plan Note (Signed)
She is maintaining normal sinus rhythm. Encouraged her to stay on her metoprolol. Heart rate is mildly elevated. We will continue to monitor this.

## 2012-09-29 NOTE — Progress Notes (Signed)
Patient ID: Mia Padilla, female    DOB: 14-Jun-1934, 78 y.o.   MRN: [id number]  HPI Comments: Ms. Philbrick is a 78 year old woman with history of stroke, seizure disorder, hypertension, COPD, diabetes, hypothyroidism, obesity, atrial fibrillation on recent hospital admission April 04 2011 for mental status changes. During her hospital course, she had an echocardiogram showing normal LV function, carotid ultrasound showing no significant disease, head CT and MRI showing no significant stroke. She reports having a "code" called on her in the hospital.  Notes indicate possible TIA. She was started on warfarin for atrial fibrillation and presents today in normal sinus rhythm.   She has severe bilateral knee pain and hip pain and walks with a walker outside the house. Uses a cane inside the house. She denies any significant shortness of breath or chest pain.  She continues to have bilateral lower extremity edema. Last year went to the wound clinic for "leaking"of her legs.   She has some blistering of her skin today the posterior aspect of both legs. Denies any leaking. Some tightness of the legs around the ankle area. Coumadin was stopped secondary to unsteady gait by Dr. Dossie Arbour. She was started on Plavix 10/13/2011. She denies any recent falls.   Notes indicate that she has had a history of seizures, possibly with low glucose levels. Previous diagnosis of complex partial seizures and she was started on antiseizure medications. Weaned off Keppra  Lab work shows hemoglobin A1c 7.8, total cholesterol 155, LDL 91, HDL 44, TSH 4.1, creatinine 0.69, BUN 10  EKG shows normal sinus rhythm with rate 95 beats per minute, no significant ST or T wave changes   Outpatient Encounter Prescriptions as of 09/29/2012  Medication Sig Dispense Refill  . albuterol (PROVENTIL HFA;VENTOLIN HFA) 108 (90 BASE) MCG/ACT inhaler Inhale 2 puffs into the lungs every 6 (six) hours as needed.      Marland Kitchen aspirin EC 81 MG tablet Take 81  mg by mouth daily.      . brimonidine (ALPHAGAN P) 0.1 % SOLN       . brinzolamide (AZOPT) 1 % ophthalmic suspension Apply 1 drop to eye 3 (three) times daily.      . cephALEXin (KEFLEX) 500 MG capsule Take 500 mg by mouth 4 (four) times daily. 7 days.      . clopidogrel (PLAVIX) 75 MG tablet Take 75 mg by mouth daily.      . digoxin (LANOXIN) 0.125 MG tablet Take 125 mcg by mouth daily.      . dorzolamide (TRUSOPT) 2 % ophthalmic solution 1 drop 3 (three) times daily.      . fluticasone (FLONASE) 50 MCG/ACT nasal spray Place 2 sprays into the nose daily.      . Fluticasone-Salmeterol (ADVAIR) 250-50 MCG/DOSE AEPB Inhale 1 puff into the lungs every 12 (twelve) hours.      Marland Kitchen guaiFENesin (MUCINEX) 600 MG 12 hr tablet Take 1,200 mg by mouth 2 (two) times daily.      . insulin aspart (NOVOLOG) 100 UNIT/ML injection Inject into the skin. Sliding scale      . insulin detemir (LEVEMIR) 100 UNIT/ML injection Inject 23 Units into the skin at bedtime.      Marland Kitchen levothyroxine (SYNTHROID, LEVOTHROID) 75 MCG tablet Take 75 mcg by mouth daily.      Marland Kitchen lisinopril (PRINIVIL,ZESTRIL) 10 MG tablet Take 1 tablet (10 mg total) by mouth daily.  30 tablet  6  . meclizine (ANTIVERT) 25 MG tablet Take 25 mg by mouth  3 (three) times daily as needed.      . metFORMIN (GLUCOPHAGE) 500 MG tablet Take 500 mg by mouth 2 (two) times daily with a meal.      . simvastatin (ZOCOR) 40 MG tablet Take 40 mg by mouth every evening.      . solifenacin (VESICARE) 5 MG tablet Take 10 mg by mouth daily.      . travoprost, benzalkonium, (TRAVATAN) 0.004 % ophthalmic solution Apply 1 drop to eye at bedtime.      . furosemide (LASIX) 20 MG tablet Take 1 tablet (20 mg total) by mouth daily as needed.  30 tablet  3  . metoprolol succinate (TOPROL-XL) 100 MG 24 hr tablet Take 100 mg by mouth daily.       . potassium chloride (K-DUR) 10 MEQ tablet Take 1 tablet (10 mEq total) by mouth daily as needed.  30 tablet  3    Review of Systems   Constitutional: Negative.   HENT: Negative.   Eyes: Negative.   Respiratory: Negative.   Cardiovascular: Negative.   Gastrointestinal: Negative.   Musculoskeletal: Positive for back pain, joint swelling, arthralgias and gait problem.  Skin: Negative.   Neurological: Negative.   Psychiatric/Behavioral: Negative.   All other systems reviewed and are negative.   BP 147/81  Pulse 95  Ht 5\' 4"  (1.626 m)  Wt 201 lb 12 oz (91.513 kg)  BMI 34.61 kg/m2  Physical Exam  Nursing note and vitals reviewed. Constitutional: She is oriented to person, place, and time. She appears well-developed and well-nourished.  Obese, walks with a walker  HENT:  Head: Normocephalic.  Nose: Nose normal.  Mouth/Throat: Oropharynx is clear and moist.  Eyes: Conjunctivae are normal. Pupils are equal, round, and reactive to light.  Neck: Normal range of motion. Neck supple. No JVD present.  Cardiovascular: Normal rate, regular rhythm, S1 normal, S2 normal and intact distal pulses.  Exam reveals no gallop and no friction rub.   Murmur heard.  Crescendo systolic murmur is present with a grade of 2/6  Pulmonary/Chest: Effort normal and breath sounds normal. No respiratory distress. She has no wheezes. She has no rales. She exhibits no tenderness.  Abdominal: Soft. Bowel sounds are normal. She exhibits no distension. There is no tenderness.  Musculoskeletal: Normal range of motion. She exhibits no edema and no tenderness.  Lymphadenopathy:    She has no cervical adenopathy.  Neurological: She is alert and oriented to person, place, and time. Coordination normal.  Skin: Skin is warm and dry. No rash noted. No erythema.  Psychiatric: She has a normal mood and affect. Her behavior is normal. Judgment and thought content normal.    Assessment and Plan

## 2012-09-30 DIAGNOSIS — D510 Vitamin B12 deficiency anemia due to intrinsic factor deficiency: Secondary | ICD-10-CM | POA: Diagnosis not present

## 2012-09-30 DIAGNOSIS — I4891 Unspecified atrial fibrillation: Secondary | ICD-10-CM | POA: Diagnosis not present

## 2012-09-30 DIAGNOSIS — L03319 Cellulitis of trunk, unspecified: Secondary | ICD-10-CM | POA: Diagnosis not present

## 2012-09-30 DIAGNOSIS — I951 Orthostatic hypotension: Secondary | ICD-10-CM | POA: Diagnosis not present

## 2012-09-30 DIAGNOSIS — E119 Type 2 diabetes mellitus without complications: Secondary | ICD-10-CM | POA: Diagnosis not present

## 2012-10-03 DIAGNOSIS — I951 Orthostatic hypotension: Secondary | ICD-10-CM | POA: Diagnosis not present

## 2012-10-03 DIAGNOSIS — E119 Type 2 diabetes mellitus without complications: Secondary | ICD-10-CM | POA: Diagnosis not present

## 2012-10-03 DIAGNOSIS — L02219 Cutaneous abscess of trunk, unspecified: Secondary | ICD-10-CM | POA: Diagnosis not present

## 2012-10-03 DIAGNOSIS — I4891 Unspecified atrial fibrillation: Secondary | ICD-10-CM | POA: Diagnosis not present

## 2012-10-03 DIAGNOSIS — D510 Vitamin B12 deficiency anemia due to intrinsic factor deficiency: Secondary | ICD-10-CM | POA: Diagnosis not present

## 2012-10-05 DIAGNOSIS — I951 Orthostatic hypotension: Secondary | ICD-10-CM | POA: Diagnosis not present

## 2012-10-05 DIAGNOSIS — I4891 Unspecified atrial fibrillation: Secondary | ICD-10-CM | POA: Diagnosis not present

## 2012-10-05 DIAGNOSIS — E119 Type 2 diabetes mellitus without complications: Secondary | ICD-10-CM | POA: Diagnosis not present

## 2012-10-05 DIAGNOSIS — D510 Vitamin B12 deficiency anemia due to intrinsic factor deficiency: Secondary | ICD-10-CM | POA: Diagnosis not present

## 2012-10-05 DIAGNOSIS — L02219 Cutaneous abscess of trunk, unspecified: Secondary | ICD-10-CM | POA: Diagnosis not present

## 2012-10-07 DIAGNOSIS — L02219 Cutaneous abscess of trunk, unspecified: Secondary | ICD-10-CM | POA: Diagnosis not present

## 2012-10-07 DIAGNOSIS — I951 Orthostatic hypotension: Secondary | ICD-10-CM | POA: Diagnosis not present

## 2012-10-07 DIAGNOSIS — I4891 Unspecified atrial fibrillation: Secondary | ICD-10-CM | POA: Diagnosis not present

## 2012-10-07 DIAGNOSIS — D510 Vitamin B12 deficiency anemia due to intrinsic factor deficiency: Secondary | ICD-10-CM | POA: Diagnosis not present

## 2012-10-07 DIAGNOSIS — E119 Type 2 diabetes mellitus without complications: Secondary | ICD-10-CM | POA: Diagnosis not present

## 2012-10-10 DIAGNOSIS — I951 Orthostatic hypotension: Secondary | ICD-10-CM | POA: Diagnosis not present

## 2012-10-10 DIAGNOSIS — D510 Vitamin B12 deficiency anemia due to intrinsic factor deficiency: Secondary | ICD-10-CM | POA: Diagnosis not present

## 2012-10-10 DIAGNOSIS — E119 Type 2 diabetes mellitus without complications: Secondary | ICD-10-CM | POA: Diagnosis not present

## 2012-10-10 DIAGNOSIS — I4891 Unspecified atrial fibrillation: Secondary | ICD-10-CM | POA: Diagnosis not present

## 2012-10-10 DIAGNOSIS — L03319 Cellulitis of trunk, unspecified: Secondary | ICD-10-CM | POA: Diagnosis not present

## 2012-10-11 DIAGNOSIS — I951 Orthostatic hypotension: Secondary | ICD-10-CM | POA: Diagnosis not present

## 2012-10-11 DIAGNOSIS — I4891 Unspecified atrial fibrillation: Secondary | ICD-10-CM | POA: Diagnosis not present

## 2012-10-11 DIAGNOSIS — L03319 Cellulitis of trunk, unspecified: Secondary | ICD-10-CM | POA: Diagnosis not present

## 2012-10-11 DIAGNOSIS — D510 Vitamin B12 deficiency anemia due to intrinsic factor deficiency: Secondary | ICD-10-CM | POA: Diagnosis not present

## 2012-10-12 DIAGNOSIS — I4891 Unspecified atrial fibrillation: Secondary | ICD-10-CM | POA: Diagnosis not present

## 2012-10-12 DIAGNOSIS — L02219 Cutaneous abscess of trunk, unspecified: Secondary | ICD-10-CM | POA: Diagnosis not present

## 2012-10-12 DIAGNOSIS — E119 Type 2 diabetes mellitus without complications: Secondary | ICD-10-CM | POA: Diagnosis not present

## 2012-10-12 DIAGNOSIS — D510 Vitamin B12 deficiency anemia due to intrinsic factor deficiency: Secondary | ICD-10-CM | POA: Diagnosis not present

## 2012-10-12 DIAGNOSIS — I951 Orthostatic hypotension: Secondary | ICD-10-CM | POA: Diagnosis not present

## 2012-10-14 DIAGNOSIS — D510 Vitamin B12 deficiency anemia due to intrinsic factor deficiency: Secondary | ICD-10-CM | POA: Diagnosis not present

## 2012-10-14 DIAGNOSIS — L03319 Cellulitis of trunk, unspecified: Secondary | ICD-10-CM | POA: Diagnosis not present

## 2012-10-14 DIAGNOSIS — E119 Type 2 diabetes mellitus without complications: Secondary | ICD-10-CM | POA: Diagnosis not present

## 2012-10-14 DIAGNOSIS — I951 Orthostatic hypotension: Secondary | ICD-10-CM | POA: Diagnosis not present

## 2012-10-14 DIAGNOSIS — I4891 Unspecified atrial fibrillation: Secondary | ICD-10-CM | POA: Diagnosis not present

## 2012-10-17 DIAGNOSIS — L02219 Cutaneous abscess of trunk, unspecified: Secondary | ICD-10-CM | POA: Diagnosis not present

## 2012-10-17 DIAGNOSIS — I4891 Unspecified atrial fibrillation: Secondary | ICD-10-CM | POA: Diagnosis not present

## 2012-10-17 DIAGNOSIS — I951 Orthostatic hypotension: Secondary | ICD-10-CM | POA: Diagnosis not present

## 2012-10-17 DIAGNOSIS — D510 Vitamin B12 deficiency anemia due to intrinsic factor deficiency: Secondary | ICD-10-CM | POA: Diagnosis not present

## 2012-10-17 DIAGNOSIS — E119 Type 2 diabetes mellitus without complications: Secondary | ICD-10-CM | POA: Diagnosis not present

## 2012-10-18 ENCOUNTER — Ambulatory Visit: Payer: Medicare Other | Admitting: Cardiovascular Disease

## 2012-10-18 DIAGNOSIS — I951 Orthostatic hypotension: Secondary | ICD-10-CM | POA: Diagnosis not present

## 2012-10-18 DIAGNOSIS — I4891 Unspecified atrial fibrillation: Secondary | ICD-10-CM | POA: Diagnosis not present

## 2012-10-18 DIAGNOSIS — D510 Vitamin B12 deficiency anemia due to intrinsic factor deficiency: Secondary | ICD-10-CM | POA: Diagnosis not present

## 2012-10-18 DIAGNOSIS — L03319 Cellulitis of trunk, unspecified: Secondary | ICD-10-CM | POA: Diagnosis not present

## 2012-10-18 DIAGNOSIS — E119 Type 2 diabetes mellitus without complications: Secondary | ICD-10-CM | POA: Diagnosis not present

## 2012-10-19 DIAGNOSIS — E119 Type 2 diabetes mellitus without complications: Secondary | ICD-10-CM | POA: Diagnosis not present

## 2012-10-19 DIAGNOSIS — L02219 Cutaneous abscess of trunk, unspecified: Secondary | ICD-10-CM | POA: Diagnosis not present

## 2012-10-19 DIAGNOSIS — D510 Vitamin B12 deficiency anemia due to intrinsic factor deficiency: Secondary | ICD-10-CM | POA: Diagnosis not present

## 2012-10-19 DIAGNOSIS — I4891 Unspecified atrial fibrillation: Secondary | ICD-10-CM | POA: Diagnosis not present

## 2012-10-19 DIAGNOSIS — I951 Orthostatic hypotension: Secondary | ICD-10-CM | POA: Diagnosis not present

## 2012-10-20 DIAGNOSIS — I4891 Unspecified atrial fibrillation: Secondary | ICD-10-CM | POA: Diagnosis not present

## 2012-10-20 DIAGNOSIS — I951 Orthostatic hypotension: Secondary | ICD-10-CM | POA: Diagnosis not present

## 2012-10-20 DIAGNOSIS — L02219 Cutaneous abscess of trunk, unspecified: Secondary | ICD-10-CM | POA: Diagnosis not present

## 2012-10-20 DIAGNOSIS — D510 Vitamin B12 deficiency anemia due to intrinsic factor deficiency: Secondary | ICD-10-CM | POA: Diagnosis not present

## 2012-10-20 DIAGNOSIS — E119 Type 2 diabetes mellitus without complications: Secondary | ICD-10-CM | POA: Diagnosis not present

## 2012-10-20 DIAGNOSIS — L03319 Cellulitis of trunk, unspecified: Secondary | ICD-10-CM | POA: Diagnosis not present

## 2012-10-24 DIAGNOSIS — L03319 Cellulitis of trunk, unspecified: Secondary | ICD-10-CM | POA: Diagnosis not present

## 2012-10-24 DIAGNOSIS — E119 Type 2 diabetes mellitus without complications: Secondary | ICD-10-CM | POA: Diagnosis not present

## 2012-10-24 DIAGNOSIS — I4891 Unspecified atrial fibrillation: Secondary | ICD-10-CM | POA: Diagnosis not present

## 2012-10-24 DIAGNOSIS — I951 Orthostatic hypotension: Secondary | ICD-10-CM | POA: Diagnosis not present

## 2012-10-24 DIAGNOSIS — D510 Vitamin B12 deficiency anemia due to intrinsic factor deficiency: Secondary | ICD-10-CM | POA: Diagnosis not present

## 2012-10-26 DIAGNOSIS — D510 Vitamin B12 deficiency anemia due to intrinsic factor deficiency: Secondary | ICD-10-CM | POA: Diagnosis not present

## 2012-10-26 DIAGNOSIS — L02219 Cutaneous abscess of trunk, unspecified: Secondary | ICD-10-CM | POA: Diagnosis not present

## 2012-10-26 DIAGNOSIS — I951 Orthostatic hypotension: Secondary | ICD-10-CM | POA: Diagnosis not present

## 2012-10-26 DIAGNOSIS — E119 Type 2 diabetes mellitus without complications: Secondary | ICD-10-CM | POA: Diagnosis not present

## 2012-10-26 DIAGNOSIS — I4891 Unspecified atrial fibrillation: Secondary | ICD-10-CM | POA: Diagnosis not present

## 2012-10-27 DIAGNOSIS — H4011X0 Primary open-angle glaucoma, stage unspecified: Secondary | ICD-10-CM | POA: Diagnosis not present

## 2012-10-27 DIAGNOSIS — H409 Unspecified glaucoma: Secondary | ICD-10-CM | POA: Diagnosis not present

## 2012-10-27 DIAGNOSIS — H269 Unspecified cataract: Secondary | ICD-10-CM | POA: Diagnosis not present

## 2012-10-28 DIAGNOSIS — E119 Type 2 diabetes mellitus without complications: Secondary | ICD-10-CM | POA: Diagnosis not present

## 2012-10-28 DIAGNOSIS — I4891 Unspecified atrial fibrillation: Secondary | ICD-10-CM | POA: Diagnosis not present

## 2012-10-28 DIAGNOSIS — L03319 Cellulitis of trunk, unspecified: Secondary | ICD-10-CM | POA: Diagnosis not present

## 2012-10-28 DIAGNOSIS — D510 Vitamin B12 deficiency anemia due to intrinsic factor deficiency: Secondary | ICD-10-CM | POA: Diagnosis not present

## 2012-10-28 DIAGNOSIS — I951 Orthostatic hypotension: Secondary | ICD-10-CM | POA: Diagnosis not present

## 2012-10-31 DIAGNOSIS — I951 Orthostatic hypotension: Secondary | ICD-10-CM | POA: Diagnosis not present

## 2012-10-31 DIAGNOSIS — L03319 Cellulitis of trunk, unspecified: Secondary | ICD-10-CM | POA: Diagnosis not present

## 2012-10-31 DIAGNOSIS — E119 Type 2 diabetes mellitus without complications: Secondary | ICD-10-CM | POA: Diagnosis not present

## 2012-10-31 DIAGNOSIS — I4891 Unspecified atrial fibrillation: Secondary | ICD-10-CM | POA: Diagnosis not present

## 2012-10-31 DIAGNOSIS — D510 Vitamin B12 deficiency anemia due to intrinsic factor deficiency: Secondary | ICD-10-CM | POA: Diagnosis not present

## 2012-11-02 DIAGNOSIS — I4891 Unspecified atrial fibrillation: Secondary | ICD-10-CM | POA: Diagnosis not present

## 2012-11-02 DIAGNOSIS — L02219 Cutaneous abscess of trunk, unspecified: Secondary | ICD-10-CM | POA: Diagnosis not present

## 2012-11-02 DIAGNOSIS — E119 Type 2 diabetes mellitus without complications: Secondary | ICD-10-CM | POA: Diagnosis not present

## 2012-11-02 DIAGNOSIS — D510 Vitamin B12 deficiency anemia due to intrinsic factor deficiency: Secondary | ICD-10-CM | POA: Diagnosis not present

## 2012-11-02 DIAGNOSIS — I951 Orthostatic hypotension: Secondary | ICD-10-CM | POA: Diagnosis not present

## 2012-11-04 DIAGNOSIS — I4891 Unspecified atrial fibrillation: Secondary | ICD-10-CM | POA: Diagnosis not present

## 2012-11-04 DIAGNOSIS — L03319 Cellulitis of trunk, unspecified: Secondary | ICD-10-CM | POA: Diagnosis not present

## 2012-11-04 DIAGNOSIS — E119 Type 2 diabetes mellitus without complications: Secondary | ICD-10-CM | POA: Diagnosis not present

## 2012-11-04 DIAGNOSIS — I951 Orthostatic hypotension: Secondary | ICD-10-CM | POA: Diagnosis not present

## 2012-11-04 DIAGNOSIS — D510 Vitamin B12 deficiency anemia due to intrinsic factor deficiency: Secondary | ICD-10-CM | POA: Diagnosis not present

## 2012-11-07 DIAGNOSIS — I951 Orthostatic hypotension: Secondary | ICD-10-CM | POA: Diagnosis not present

## 2012-11-07 DIAGNOSIS — D510 Vitamin B12 deficiency anemia due to intrinsic factor deficiency: Secondary | ICD-10-CM | POA: Diagnosis not present

## 2012-11-07 DIAGNOSIS — I4891 Unspecified atrial fibrillation: Secondary | ICD-10-CM | POA: Diagnosis not present

## 2012-11-07 DIAGNOSIS — E119 Type 2 diabetes mellitus without complications: Secondary | ICD-10-CM | POA: Diagnosis not present

## 2012-11-07 DIAGNOSIS — L02219 Cutaneous abscess of trunk, unspecified: Secondary | ICD-10-CM | POA: Diagnosis not present

## 2012-11-09 DIAGNOSIS — L03319 Cellulitis of trunk, unspecified: Secondary | ICD-10-CM | POA: Diagnosis not present

## 2012-11-09 DIAGNOSIS — L02219 Cutaneous abscess of trunk, unspecified: Secondary | ICD-10-CM | POA: Diagnosis not present

## 2012-11-09 DIAGNOSIS — I951 Orthostatic hypotension: Secondary | ICD-10-CM | POA: Diagnosis not present

## 2012-11-09 DIAGNOSIS — D510 Vitamin B12 deficiency anemia due to intrinsic factor deficiency: Secondary | ICD-10-CM | POA: Diagnosis not present

## 2012-11-09 DIAGNOSIS — I4891 Unspecified atrial fibrillation: Secondary | ICD-10-CM | POA: Diagnosis not present

## 2012-11-09 DIAGNOSIS — E119 Type 2 diabetes mellitus without complications: Secondary | ICD-10-CM | POA: Diagnosis not present

## 2012-11-10 DIAGNOSIS — I951 Orthostatic hypotension: Secondary | ICD-10-CM | POA: Diagnosis not present

## 2012-11-10 DIAGNOSIS — E119 Type 2 diabetes mellitus without complications: Secondary | ICD-10-CM | POA: Diagnosis not present

## 2012-11-10 DIAGNOSIS — I4891 Unspecified atrial fibrillation: Secondary | ICD-10-CM | POA: Diagnosis not present

## 2012-11-10 DIAGNOSIS — D510 Vitamin B12 deficiency anemia due to intrinsic factor deficiency: Secondary | ICD-10-CM | POA: Diagnosis not present

## 2012-11-10 DIAGNOSIS — L03319 Cellulitis of trunk, unspecified: Secondary | ICD-10-CM | POA: Diagnosis not present

## 2012-11-15 DIAGNOSIS — I4891 Unspecified atrial fibrillation: Secondary | ICD-10-CM | POA: Diagnosis not present

## 2012-11-15 DIAGNOSIS — D510 Vitamin B12 deficiency anemia due to intrinsic factor deficiency: Secondary | ICD-10-CM | POA: Diagnosis not present

## 2012-11-15 DIAGNOSIS — L03319 Cellulitis of trunk, unspecified: Secondary | ICD-10-CM | POA: Diagnosis not present

## 2012-11-15 DIAGNOSIS — E119 Type 2 diabetes mellitus without complications: Secondary | ICD-10-CM | POA: Diagnosis not present

## 2012-11-15 DIAGNOSIS — I951 Orthostatic hypotension: Secondary | ICD-10-CM | POA: Diagnosis not present

## 2012-11-16 DIAGNOSIS — I4891 Unspecified atrial fibrillation: Secondary | ICD-10-CM | POA: Diagnosis not present

## 2012-11-16 DIAGNOSIS — L03319 Cellulitis of trunk, unspecified: Secondary | ICD-10-CM | POA: Diagnosis not present

## 2012-11-16 DIAGNOSIS — E119 Type 2 diabetes mellitus without complications: Secondary | ICD-10-CM | POA: Diagnosis not present

## 2012-11-16 DIAGNOSIS — I951 Orthostatic hypotension: Secondary | ICD-10-CM | POA: Diagnosis not present

## 2012-11-16 DIAGNOSIS — D510 Vitamin B12 deficiency anemia due to intrinsic factor deficiency: Secondary | ICD-10-CM | POA: Diagnosis not present

## 2012-11-21 DIAGNOSIS — I4891 Unspecified atrial fibrillation: Secondary | ICD-10-CM | POA: Diagnosis not present

## 2012-11-21 DIAGNOSIS — I951 Orthostatic hypotension: Secondary | ICD-10-CM | POA: Diagnosis not present

## 2012-11-21 DIAGNOSIS — D510 Vitamin B12 deficiency anemia due to intrinsic factor deficiency: Secondary | ICD-10-CM | POA: Diagnosis not present

## 2012-11-21 DIAGNOSIS — L02219 Cutaneous abscess of trunk, unspecified: Secondary | ICD-10-CM | POA: Diagnosis not present

## 2012-11-21 DIAGNOSIS — E119 Type 2 diabetes mellitus without complications: Secondary | ICD-10-CM | POA: Diagnosis not present

## 2012-11-22 DIAGNOSIS — I4891 Unspecified atrial fibrillation: Secondary | ICD-10-CM | POA: Diagnosis not present

## 2012-11-22 DIAGNOSIS — L02219 Cutaneous abscess of trunk, unspecified: Secondary | ICD-10-CM | POA: Diagnosis not present

## 2012-11-22 DIAGNOSIS — E119 Type 2 diabetes mellitus without complications: Secondary | ICD-10-CM | POA: Diagnosis not present

## 2012-11-22 DIAGNOSIS — D510 Vitamin B12 deficiency anemia due to intrinsic factor deficiency: Secondary | ICD-10-CM | POA: Diagnosis not present

## 2012-11-22 DIAGNOSIS — I951 Orthostatic hypotension: Secondary | ICD-10-CM | POA: Diagnosis not present

## 2012-11-23 DIAGNOSIS — I951 Orthostatic hypotension: Secondary | ICD-10-CM | POA: Diagnosis not present

## 2012-11-23 DIAGNOSIS — I4891 Unspecified atrial fibrillation: Secondary | ICD-10-CM | POA: Diagnosis not present

## 2012-11-23 DIAGNOSIS — D510 Vitamin B12 deficiency anemia due to intrinsic factor deficiency: Secondary | ICD-10-CM | POA: Diagnosis not present

## 2012-11-23 DIAGNOSIS — E119 Type 2 diabetes mellitus without complications: Secondary | ICD-10-CM | POA: Diagnosis not present

## 2012-11-23 DIAGNOSIS — L02219 Cutaneous abscess of trunk, unspecified: Secondary | ICD-10-CM | POA: Diagnosis not present

## 2012-11-25 DIAGNOSIS — I4891 Unspecified atrial fibrillation: Secondary | ICD-10-CM | POA: Diagnosis not present

## 2012-11-25 DIAGNOSIS — D510 Vitamin B12 deficiency anemia due to intrinsic factor deficiency: Secondary | ICD-10-CM | POA: Diagnosis not present

## 2012-11-25 DIAGNOSIS — L02219 Cutaneous abscess of trunk, unspecified: Secondary | ICD-10-CM | POA: Diagnosis not present

## 2012-11-25 DIAGNOSIS — E119 Type 2 diabetes mellitus without complications: Secondary | ICD-10-CM | POA: Diagnosis not present

## 2012-11-25 DIAGNOSIS — I951 Orthostatic hypotension: Secondary | ICD-10-CM | POA: Diagnosis not present

## 2012-11-28 DIAGNOSIS — L02219 Cutaneous abscess of trunk, unspecified: Secondary | ICD-10-CM | POA: Diagnosis not present

## 2012-11-28 DIAGNOSIS — E119 Type 2 diabetes mellitus without complications: Secondary | ICD-10-CM | POA: Diagnosis not present

## 2012-11-28 DIAGNOSIS — I4891 Unspecified atrial fibrillation: Secondary | ICD-10-CM | POA: Diagnosis not present

## 2012-11-28 DIAGNOSIS — I951 Orthostatic hypotension: Secondary | ICD-10-CM | POA: Diagnosis not present

## 2012-11-28 DIAGNOSIS — D510 Vitamin B12 deficiency anemia due to intrinsic factor deficiency: Secondary | ICD-10-CM | POA: Diagnosis not present

## 2012-12-01 DIAGNOSIS — I951 Orthostatic hypotension: Secondary | ICD-10-CM | POA: Diagnosis not present

## 2012-12-01 DIAGNOSIS — E119 Type 2 diabetes mellitus without complications: Secondary | ICD-10-CM | POA: Diagnosis not present

## 2012-12-01 DIAGNOSIS — L03319 Cellulitis of trunk, unspecified: Secondary | ICD-10-CM | POA: Diagnosis not present

## 2012-12-01 DIAGNOSIS — I4891 Unspecified atrial fibrillation: Secondary | ICD-10-CM | POA: Diagnosis not present

## 2012-12-01 DIAGNOSIS — D510 Vitamin B12 deficiency anemia due to intrinsic factor deficiency: Secondary | ICD-10-CM | POA: Diagnosis not present

## 2012-12-02 DIAGNOSIS — L03319 Cellulitis of trunk, unspecified: Secondary | ICD-10-CM | POA: Diagnosis not present

## 2012-12-02 DIAGNOSIS — I951 Orthostatic hypotension: Secondary | ICD-10-CM | POA: Diagnosis not present

## 2012-12-02 DIAGNOSIS — E119 Type 2 diabetes mellitus without complications: Secondary | ICD-10-CM | POA: Diagnosis not present

## 2012-12-02 DIAGNOSIS — I4891 Unspecified atrial fibrillation: Secondary | ICD-10-CM | POA: Diagnosis not present

## 2012-12-02 DIAGNOSIS — D510 Vitamin B12 deficiency anemia due to intrinsic factor deficiency: Secondary | ICD-10-CM | POA: Diagnosis not present

## 2012-12-05 DIAGNOSIS — E119 Type 2 diabetes mellitus without complications: Secondary | ICD-10-CM | POA: Diagnosis not present

## 2012-12-05 DIAGNOSIS — I4891 Unspecified atrial fibrillation: Secondary | ICD-10-CM | POA: Diagnosis not present

## 2012-12-05 DIAGNOSIS — D510 Vitamin B12 deficiency anemia due to intrinsic factor deficiency: Secondary | ICD-10-CM | POA: Diagnosis not present

## 2012-12-05 DIAGNOSIS — L02219 Cutaneous abscess of trunk, unspecified: Secondary | ICD-10-CM | POA: Diagnosis not present

## 2012-12-05 DIAGNOSIS — I951 Orthostatic hypotension: Secondary | ICD-10-CM | POA: Diagnosis not present

## 2012-12-07 DIAGNOSIS — I4891 Unspecified atrial fibrillation: Secondary | ICD-10-CM | POA: Diagnosis not present

## 2012-12-07 DIAGNOSIS — D510 Vitamin B12 deficiency anemia due to intrinsic factor deficiency: Secondary | ICD-10-CM | POA: Diagnosis not present

## 2012-12-07 DIAGNOSIS — L02219 Cutaneous abscess of trunk, unspecified: Secondary | ICD-10-CM | POA: Diagnosis not present

## 2012-12-07 DIAGNOSIS — E119 Type 2 diabetes mellitus without complications: Secondary | ICD-10-CM | POA: Diagnosis not present

## 2012-12-07 DIAGNOSIS — I951 Orthostatic hypotension: Secondary | ICD-10-CM | POA: Diagnosis not present

## 2012-12-08 DIAGNOSIS — I4891 Unspecified atrial fibrillation: Secondary | ICD-10-CM | POA: Diagnosis not present

## 2012-12-08 DIAGNOSIS — L02219 Cutaneous abscess of trunk, unspecified: Secondary | ICD-10-CM | POA: Diagnosis not present

## 2012-12-08 DIAGNOSIS — D510 Vitamin B12 deficiency anemia due to intrinsic factor deficiency: Secondary | ICD-10-CM | POA: Diagnosis not present

## 2012-12-08 DIAGNOSIS — I951 Orthostatic hypotension: Secondary | ICD-10-CM | POA: Diagnosis not present

## 2012-12-08 DIAGNOSIS — E119 Type 2 diabetes mellitus without complications: Secondary | ICD-10-CM | POA: Diagnosis not present

## 2012-12-09 DIAGNOSIS — L02219 Cutaneous abscess of trunk, unspecified: Secondary | ICD-10-CM | POA: Diagnosis not present

## 2012-12-09 DIAGNOSIS — D510 Vitamin B12 deficiency anemia due to intrinsic factor deficiency: Secondary | ICD-10-CM | POA: Diagnosis not present

## 2012-12-09 DIAGNOSIS — I4891 Unspecified atrial fibrillation: Secondary | ICD-10-CM | POA: Diagnosis not present

## 2012-12-09 DIAGNOSIS — I951 Orthostatic hypotension: Secondary | ICD-10-CM | POA: Diagnosis not present

## 2012-12-09 DIAGNOSIS — E119 Type 2 diabetes mellitus without complications: Secondary | ICD-10-CM | POA: Diagnosis not present

## 2012-12-12 DIAGNOSIS — L02219 Cutaneous abscess of trunk, unspecified: Secondary | ICD-10-CM | POA: Diagnosis not present

## 2012-12-12 DIAGNOSIS — I951 Orthostatic hypotension: Secondary | ICD-10-CM | POA: Diagnosis not present

## 2012-12-12 DIAGNOSIS — E119 Type 2 diabetes mellitus without complications: Secondary | ICD-10-CM | POA: Diagnosis not present

## 2012-12-12 DIAGNOSIS — D510 Vitamin B12 deficiency anemia due to intrinsic factor deficiency: Secondary | ICD-10-CM | POA: Diagnosis not present

## 2012-12-12 DIAGNOSIS — I4891 Unspecified atrial fibrillation: Secondary | ICD-10-CM | POA: Diagnosis not present

## 2012-12-14 DIAGNOSIS — D510 Vitamin B12 deficiency anemia due to intrinsic factor deficiency: Secondary | ICD-10-CM | POA: Diagnosis not present

## 2012-12-14 DIAGNOSIS — L02219 Cutaneous abscess of trunk, unspecified: Secondary | ICD-10-CM | POA: Diagnosis not present

## 2012-12-14 DIAGNOSIS — E119 Type 2 diabetes mellitus without complications: Secondary | ICD-10-CM | POA: Diagnosis not present

## 2012-12-14 DIAGNOSIS — I951 Orthostatic hypotension: Secondary | ICD-10-CM | POA: Diagnosis not present

## 2012-12-14 DIAGNOSIS — I4891 Unspecified atrial fibrillation: Secondary | ICD-10-CM | POA: Diagnosis not present

## 2012-12-16 DIAGNOSIS — D510 Vitamin B12 deficiency anemia due to intrinsic factor deficiency: Secondary | ICD-10-CM | POA: Diagnosis not present

## 2012-12-16 DIAGNOSIS — I4891 Unspecified atrial fibrillation: Secondary | ICD-10-CM | POA: Diagnosis not present

## 2012-12-16 DIAGNOSIS — E119 Type 2 diabetes mellitus without complications: Secondary | ICD-10-CM | POA: Diagnosis not present

## 2012-12-16 DIAGNOSIS — I951 Orthostatic hypotension: Secondary | ICD-10-CM | POA: Diagnosis not present

## 2012-12-16 DIAGNOSIS — L02219 Cutaneous abscess of trunk, unspecified: Secondary | ICD-10-CM | POA: Diagnosis not present

## 2012-12-20 DIAGNOSIS — D510 Vitamin B12 deficiency anemia due to intrinsic factor deficiency: Secondary | ICD-10-CM | POA: Diagnosis not present

## 2012-12-20 DIAGNOSIS — L02219 Cutaneous abscess of trunk, unspecified: Secondary | ICD-10-CM | POA: Diagnosis not present

## 2012-12-20 DIAGNOSIS — E119 Type 2 diabetes mellitus without complications: Secondary | ICD-10-CM | POA: Diagnosis not present

## 2012-12-20 DIAGNOSIS — I951 Orthostatic hypotension: Secondary | ICD-10-CM | POA: Diagnosis not present

## 2012-12-20 DIAGNOSIS — I4891 Unspecified atrial fibrillation: Secondary | ICD-10-CM | POA: Diagnosis not present

## 2012-12-21 DIAGNOSIS — E119 Type 2 diabetes mellitus without complications: Secondary | ICD-10-CM | POA: Diagnosis not present

## 2012-12-21 DIAGNOSIS — D510 Vitamin B12 deficiency anemia due to intrinsic factor deficiency: Secondary | ICD-10-CM | POA: Diagnosis not present

## 2012-12-21 DIAGNOSIS — I951 Orthostatic hypotension: Secondary | ICD-10-CM | POA: Diagnosis not present

## 2012-12-21 DIAGNOSIS — L02219 Cutaneous abscess of trunk, unspecified: Secondary | ICD-10-CM | POA: Diagnosis not present

## 2012-12-21 DIAGNOSIS — I4891 Unspecified atrial fibrillation: Secondary | ICD-10-CM | POA: Diagnosis not present

## 2012-12-21 DIAGNOSIS — B351 Tinea unguium: Secondary | ICD-10-CM | POA: Diagnosis not present

## 2012-12-23 DIAGNOSIS — I951 Orthostatic hypotension: Secondary | ICD-10-CM | POA: Diagnosis not present

## 2012-12-23 DIAGNOSIS — L02219 Cutaneous abscess of trunk, unspecified: Secondary | ICD-10-CM | POA: Diagnosis not present

## 2012-12-23 DIAGNOSIS — E119 Type 2 diabetes mellitus without complications: Secondary | ICD-10-CM | POA: Diagnosis not present

## 2012-12-23 DIAGNOSIS — D510 Vitamin B12 deficiency anemia due to intrinsic factor deficiency: Secondary | ICD-10-CM | POA: Diagnosis not present

## 2012-12-23 DIAGNOSIS — I4891 Unspecified atrial fibrillation: Secondary | ICD-10-CM | POA: Diagnosis not present

## 2012-12-26 DIAGNOSIS — L02219 Cutaneous abscess of trunk, unspecified: Secondary | ICD-10-CM | POA: Diagnosis not present

## 2012-12-26 DIAGNOSIS — I951 Orthostatic hypotension: Secondary | ICD-10-CM | POA: Diagnosis not present

## 2012-12-26 DIAGNOSIS — D510 Vitamin B12 deficiency anemia due to intrinsic factor deficiency: Secondary | ICD-10-CM | POA: Diagnosis not present

## 2012-12-26 DIAGNOSIS — E119 Type 2 diabetes mellitus without complications: Secondary | ICD-10-CM | POA: Diagnosis not present

## 2012-12-26 DIAGNOSIS — I4891 Unspecified atrial fibrillation: Secondary | ICD-10-CM | POA: Diagnosis not present

## 2012-12-28 DIAGNOSIS — E119 Type 2 diabetes mellitus without complications: Secondary | ICD-10-CM | POA: Diagnosis not present

## 2012-12-28 DIAGNOSIS — D510 Vitamin B12 deficiency anemia due to intrinsic factor deficiency: Secondary | ICD-10-CM | POA: Diagnosis not present

## 2012-12-28 DIAGNOSIS — I951 Orthostatic hypotension: Secondary | ICD-10-CM | POA: Diagnosis not present

## 2012-12-28 DIAGNOSIS — I4891 Unspecified atrial fibrillation: Secondary | ICD-10-CM | POA: Diagnosis not present

## 2012-12-28 DIAGNOSIS — L02219 Cutaneous abscess of trunk, unspecified: Secondary | ICD-10-CM | POA: Diagnosis not present

## 2012-12-30 DIAGNOSIS — E119 Type 2 diabetes mellitus without complications: Secondary | ICD-10-CM | POA: Diagnosis not present

## 2012-12-30 DIAGNOSIS — L02219 Cutaneous abscess of trunk, unspecified: Secondary | ICD-10-CM | POA: Diagnosis not present

## 2012-12-30 DIAGNOSIS — I4891 Unspecified atrial fibrillation: Secondary | ICD-10-CM | POA: Diagnosis not present

## 2012-12-30 DIAGNOSIS — D510 Vitamin B12 deficiency anemia due to intrinsic factor deficiency: Secondary | ICD-10-CM | POA: Diagnosis not present

## 2012-12-30 DIAGNOSIS — I951 Orthostatic hypotension: Secondary | ICD-10-CM | POA: Diagnosis not present

## 2013-01-02 DIAGNOSIS — E119 Type 2 diabetes mellitus without complications: Secondary | ICD-10-CM | POA: Diagnosis not present

## 2013-01-02 DIAGNOSIS — I4891 Unspecified atrial fibrillation: Secondary | ICD-10-CM | POA: Diagnosis not present

## 2013-01-02 DIAGNOSIS — L02219 Cutaneous abscess of trunk, unspecified: Secondary | ICD-10-CM | POA: Diagnosis not present

## 2013-01-02 DIAGNOSIS — D510 Vitamin B12 deficiency anemia due to intrinsic factor deficiency: Secondary | ICD-10-CM | POA: Diagnosis not present

## 2013-01-02 DIAGNOSIS — I951 Orthostatic hypotension: Secondary | ICD-10-CM | POA: Diagnosis not present

## 2013-01-04 DIAGNOSIS — E119 Type 2 diabetes mellitus without complications: Secondary | ICD-10-CM | POA: Diagnosis not present

## 2013-01-04 DIAGNOSIS — D510 Vitamin B12 deficiency anemia due to intrinsic factor deficiency: Secondary | ICD-10-CM | POA: Diagnosis not present

## 2013-01-04 DIAGNOSIS — L02219 Cutaneous abscess of trunk, unspecified: Secondary | ICD-10-CM | POA: Diagnosis not present

## 2013-01-04 DIAGNOSIS — I4891 Unspecified atrial fibrillation: Secondary | ICD-10-CM | POA: Diagnosis not present

## 2013-01-04 DIAGNOSIS — G40209 Localization-related (focal) (partial) symptomatic epilepsy and epileptic syndromes with complex partial seizures, not intractable, without status epilepticus: Secondary | ICD-10-CM | POA: Diagnosis not present

## 2013-01-04 DIAGNOSIS — I951 Orthostatic hypotension: Secondary | ICD-10-CM | POA: Diagnosis not present

## 2013-01-05 DIAGNOSIS — E119 Type 2 diabetes mellitus without complications: Secondary | ICD-10-CM | POA: Diagnosis not present

## 2013-01-05 DIAGNOSIS — L02219 Cutaneous abscess of trunk, unspecified: Secondary | ICD-10-CM | POA: Diagnosis not present

## 2013-01-05 DIAGNOSIS — D510 Vitamin B12 deficiency anemia due to intrinsic factor deficiency: Secondary | ICD-10-CM | POA: Diagnosis not present

## 2013-01-05 DIAGNOSIS — I951 Orthostatic hypotension: Secondary | ICD-10-CM | POA: Diagnosis not present

## 2013-01-05 DIAGNOSIS — I4891 Unspecified atrial fibrillation: Secondary | ICD-10-CM | POA: Diagnosis not present

## 2013-01-06 DIAGNOSIS — L02219 Cutaneous abscess of trunk, unspecified: Secondary | ICD-10-CM | POA: Diagnosis not present

## 2013-01-06 DIAGNOSIS — I951 Orthostatic hypotension: Secondary | ICD-10-CM | POA: Diagnosis not present

## 2013-01-06 DIAGNOSIS — I4891 Unspecified atrial fibrillation: Secondary | ICD-10-CM | POA: Diagnosis not present

## 2013-01-06 DIAGNOSIS — D510 Vitamin B12 deficiency anemia due to intrinsic factor deficiency: Secondary | ICD-10-CM | POA: Diagnosis not present

## 2013-01-06 DIAGNOSIS — E119 Type 2 diabetes mellitus without complications: Secondary | ICD-10-CM | POA: Diagnosis not present

## 2013-01-09 DIAGNOSIS — I4891 Unspecified atrial fibrillation: Secondary | ICD-10-CM | POA: Diagnosis not present

## 2013-01-09 DIAGNOSIS — L02219 Cutaneous abscess of trunk, unspecified: Secondary | ICD-10-CM | POA: Diagnosis not present

## 2013-01-09 DIAGNOSIS — I951 Orthostatic hypotension: Secondary | ICD-10-CM | POA: Diagnosis not present

## 2013-01-09 DIAGNOSIS — E119 Type 2 diabetes mellitus without complications: Secondary | ICD-10-CM | POA: Diagnosis not present

## 2013-01-09 DIAGNOSIS — D510 Vitamin B12 deficiency anemia due to intrinsic factor deficiency: Secondary | ICD-10-CM | POA: Diagnosis not present

## 2013-01-11 DIAGNOSIS — E119 Type 2 diabetes mellitus without complications: Secondary | ICD-10-CM | POA: Diagnosis not present

## 2013-01-11 DIAGNOSIS — L02219 Cutaneous abscess of trunk, unspecified: Secondary | ICD-10-CM | POA: Diagnosis not present

## 2013-01-11 DIAGNOSIS — I4891 Unspecified atrial fibrillation: Secondary | ICD-10-CM | POA: Diagnosis not present

## 2013-01-11 DIAGNOSIS — D510 Vitamin B12 deficiency anemia due to intrinsic factor deficiency: Secondary | ICD-10-CM | POA: Diagnosis not present

## 2013-01-11 DIAGNOSIS — I951 Orthostatic hypotension: Secondary | ICD-10-CM | POA: Diagnosis not present

## 2013-01-12 DIAGNOSIS — D510 Vitamin B12 deficiency anemia due to intrinsic factor deficiency: Secondary | ICD-10-CM | POA: Diagnosis not present

## 2013-01-12 DIAGNOSIS — L02219 Cutaneous abscess of trunk, unspecified: Secondary | ICD-10-CM | POA: Diagnosis not present

## 2013-01-12 DIAGNOSIS — E119 Type 2 diabetes mellitus without complications: Secondary | ICD-10-CM | POA: Diagnosis not present

## 2013-01-12 DIAGNOSIS — I4891 Unspecified atrial fibrillation: Secondary | ICD-10-CM | POA: Diagnosis not present

## 2013-01-12 DIAGNOSIS — I951 Orthostatic hypotension: Secondary | ICD-10-CM | POA: Diagnosis not present

## 2013-01-13 DIAGNOSIS — I951 Orthostatic hypotension: Secondary | ICD-10-CM | POA: Diagnosis not present

## 2013-01-13 DIAGNOSIS — E119 Type 2 diabetes mellitus without complications: Secondary | ICD-10-CM | POA: Diagnosis not present

## 2013-01-13 DIAGNOSIS — L02219 Cutaneous abscess of trunk, unspecified: Secondary | ICD-10-CM | POA: Diagnosis not present

## 2013-01-13 DIAGNOSIS — I4891 Unspecified atrial fibrillation: Secondary | ICD-10-CM | POA: Diagnosis not present

## 2013-01-13 DIAGNOSIS — D510 Vitamin B12 deficiency anemia due to intrinsic factor deficiency: Secondary | ICD-10-CM | POA: Diagnosis not present

## 2013-01-14 DIAGNOSIS — L02219 Cutaneous abscess of trunk, unspecified: Secondary | ICD-10-CM | POA: Diagnosis not present

## 2013-01-14 DIAGNOSIS — D510 Vitamin B12 deficiency anemia due to intrinsic factor deficiency: Secondary | ICD-10-CM | POA: Diagnosis not present

## 2013-01-14 DIAGNOSIS — I951 Orthostatic hypotension: Secondary | ICD-10-CM | POA: Diagnosis not present

## 2013-01-14 DIAGNOSIS — E119 Type 2 diabetes mellitus without complications: Secondary | ICD-10-CM | POA: Diagnosis not present

## 2013-01-14 DIAGNOSIS — I4891 Unspecified atrial fibrillation: Secondary | ICD-10-CM | POA: Diagnosis not present

## 2013-01-16 DIAGNOSIS — I951 Orthostatic hypotension: Secondary | ICD-10-CM | POA: Diagnosis not present

## 2013-01-16 DIAGNOSIS — E119 Type 2 diabetes mellitus without complications: Secondary | ICD-10-CM | POA: Diagnosis not present

## 2013-01-16 DIAGNOSIS — L02219 Cutaneous abscess of trunk, unspecified: Secondary | ICD-10-CM | POA: Diagnosis not present

## 2013-01-16 DIAGNOSIS — I4891 Unspecified atrial fibrillation: Secondary | ICD-10-CM | POA: Diagnosis not present

## 2013-01-16 DIAGNOSIS — D510 Vitamin B12 deficiency anemia due to intrinsic factor deficiency: Secondary | ICD-10-CM | POA: Diagnosis not present

## 2013-01-18 DIAGNOSIS — D510 Vitamin B12 deficiency anemia due to intrinsic factor deficiency: Secondary | ICD-10-CM | POA: Diagnosis not present

## 2013-01-18 DIAGNOSIS — I951 Orthostatic hypotension: Secondary | ICD-10-CM | POA: Diagnosis not present

## 2013-01-18 DIAGNOSIS — E119 Type 2 diabetes mellitus without complications: Secondary | ICD-10-CM | POA: Diagnosis not present

## 2013-01-18 DIAGNOSIS — L02219 Cutaneous abscess of trunk, unspecified: Secondary | ICD-10-CM | POA: Diagnosis not present

## 2013-01-18 DIAGNOSIS — I4891 Unspecified atrial fibrillation: Secondary | ICD-10-CM | POA: Diagnosis not present

## 2013-01-20 DIAGNOSIS — D510 Vitamin B12 deficiency anemia due to intrinsic factor deficiency: Secondary | ICD-10-CM | POA: Diagnosis not present

## 2013-01-20 DIAGNOSIS — I951 Orthostatic hypotension: Secondary | ICD-10-CM | POA: Diagnosis not present

## 2013-01-20 DIAGNOSIS — I4891 Unspecified atrial fibrillation: Secondary | ICD-10-CM | POA: Diagnosis not present

## 2013-01-20 DIAGNOSIS — E119 Type 2 diabetes mellitus without complications: Secondary | ICD-10-CM | POA: Diagnosis not present

## 2013-01-20 DIAGNOSIS — L02219 Cutaneous abscess of trunk, unspecified: Secondary | ICD-10-CM | POA: Diagnosis not present

## 2013-01-21 DIAGNOSIS — L089 Local infection of the skin and subcutaneous tissue, unspecified: Secondary | ICD-10-CM | POA: Diagnosis not present

## 2013-01-21 DIAGNOSIS — I951 Orthostatic hypotension: Secondary | ICD-10-CM | POA: Diagnosis not present

## 2013-01-21 DIAGNOSIS — I4891 Unspecified atrial fibrillation: Secondary | ICD-10-CM | POA: Diagnosis not present

## 2013-01-21 DIAGNOSIS — D510 Vitamin B12 deficiency anemia due to intrinsic factor deficiency: Secondary | ICD-10-CM | POA: Diagnosis not present

## 2013-01-21 DIAGNOSIS — E119 Type 2 diabetes mellitus without complications: Secondary | ICD-10-CM | POA: Diagnosis not present

## 2013-01-23 DIAGNOSIS — E119 Type 2 diabetes mellitus without complications: Secondary | ICD-10-CM | POA: Diagnosis not present

## 2013-01-23 DIAGNOSIS — I4891 Unspecified atrial fibrillation: Secondary | ICD-10-CM | POA: Diagnosis not present

## 2013-01-23 DIAGNOSIS — D510 Vitamin B12 deficiency anemia due to intrinsic factor deficiency: Secondary | ICD-10-CM | POA: Diagnosis not present

## 2013-01-23 DIAGNOSIS — I951 Orthostatic hypotension: Secondary | ICD-10-CM | POA: Diagnosis not present

## 2013-01-23 DIAGNOSIS — L089 Local infection of the skin and subcutaneous tissue, unspecified: Secondary | ICD-10-CM | POA: Diagnosis not present

## 2013-01-24 DIAGNOSIS — I951 Orthostatic hypotension: Secondary | ICD-10-CM | POA: Diagnosis not present

## 2013-01-24 DIAGNOSIS — L089 Local infection of the skin and subcutaneous tissue, unspecified: Secondary | ICD-10-CM | POA: Diagnosis not present

## 2013-01-24 DIAGNOSIS — D510 Vitamin B12 deficiency anemia due to intrinsic factor deficiency: Secondary | ICD-10-CM | POA: Diagnosis not present

## 2013-01-24 DIAGNOSIS — I4891 Unspecified atrial fibrillation: Secondary | ICD-10-CM | POA: Diagnosis not present

## 2013-01-24 DIAGNOSIS — E119 Type 2 diabetes mellitus without complications: Secondary | ICD-10-CM | POA: Diagnosis not present

## 2013-01-25 DIAGNOSIS — I4891 Unspecified atrial fibrillation: Secondary | ICD-10-CM | POA: Diagnosis not present

## 2013-01-25 DIAGNOSIS — L089 Local infection of the skin and subcutaneous tissue, unspecified: Secondary | ICD-10-CM | POA: Diagnosis not present

## 2013-01-25 DIAGNOSIS — E119 Type 2 diabetes mellitus without complications: Secondary | ICD-10-CM | POA: Diagnosis not present

## 2013-01-25 DIAGNOSIS — D510 Vitamin B12 deficiency anemia due to intrinsic factor deficiency: Secondary | ICD-10-CM | POA: Diagnosis not present

## 2013-01-25 DIAGNOSIS — I951 Orthostatic hypotension: Secondary | ICD-10-CM | POA: Diagnosis not present

## 2013-01-26 DIAGNOSIS — I4891 Unspecified atrial fibrillation: Secondary | ICD-10-CM | POA: Diagnosis not present

## 2013-01-26 DIAGNOSIS — D510 Vitamin B12 deficiency anemia due to intrinsic factor deficiency: Secondary | ICD-10-CM | POA: Diagnosis not present

## 2013-01-26 DIAGNOSIS — L089 Local infection of the skin and subcutaneous tissue, unspecified: Secondary | ICD-10-CM | POA: Diagnosis not present

## 2013-01-26 DIAGNOSIS — E119 Type 2 diabetes mellitus without complications: Secondary | ICD-10-CM | POA: Diagnosis not present

## 2013-01-26 DIAGNOSIS — I951 Orthostatic hypotension: Secondary | ICD-10-CM | POA: Diagnosis not present

## 2013-01-27 ENCOUNTER — Encounter: Payer: Self-pay | Admitting: Surgery

## 2013-01-27 DIAGNOSIS — I87319 Chronic venous hypertension (idiopathic) with ulcer of unspecified lower extremity: Secondary | ICD-10-CM | POA: Diagnosis not present

## 2013-01-27 DIAGNOSIS — L97209 Non-pressure chronic ulcer of unspecified calf with unspecified severity: Secondary | ICD-10-CM | POA: Diagnosis not present

## 2013-01-30 DIAGNOSIS — I4891 Unspecified atrial fibrillation: Secondary | ICD-10-CM | POA: Diagnosis not present

## 2013-01-30 DIAGNOSIS — I951 Orthostatic hypotension: Secondary | ICD-10-CM | POA: Diagnosis not present

## 2013-01-30 DIAGNOSIS — D510 Vitamin B12 deficiency anemia due to intrinsic factor deficiency: Secondary | ICD-10-CM | POA: Diagnosis not present

## 2013-01-30 DIAGNOSIS — L089 Local infection of the skin and subcutaneous tissue, unspecified: Secondary | ICD-10-CM | POA: Diagnosis not present

## 2013-01-30 DIAGNOSIS — L02219 Cutaneous abscess of trunk, unspecified: Secondary | ICD-10-CM | POA: Diagnosis not present

## 2013-01-30 DIAGNOSIS — E119 Type 2 diabetes mellitus without complications: Secondary | ICD-10-CM | POA: Diagnosis not present

## 2013-01-31 DIAGNOSIS — Z23 Encounter for immunization: Secondary | ICD-10-CM | POA: Diagnosis not present

## 2013-02-01 DIAGNOSIS — I4891 Unspecified atrial fibrillation: Secondary | ICD-10-CM | POA: Diagnosis not present

## 2013-02-01 DIAGNOSIS — E119 Type 2 diabetes mellitus without complications: Secondary | ICD-10-CM | POA: Diagnosis not present

## 2013-02-01 DIAGNOSIS — L089 Local infection of the skin and subcutaneous tissue, unspecified: Secondary | ICD-10-CM | POA: Diagnosis not present

## 2013-02-01 DIAGNOSIS — I951 Orthostatic hypotension: Secondary | ICD-10-CM | POA: Diagnosis not present

## 2013-02-01 DIAGNOSIS — D510 Vitamin B12 deficiency anemia due to intrinsic factor deficiency: Secondary | ICD-10-CM | POA: Diagnosis not present

## 2013-02-02 DIAGNOSIS — L089 Local infection of the skin and subcutaneous tissue, unspecified: Secondary | ICD-10-CM | POA: Diagnosis not present

## 2013-02-02 DIAGNOSIS — I951 Orthostatic hypotension: Secondary | ICD-10-CM | POA: Diagnosis not present

## 2013-02-02 DIAGNOSIS — E119 Type 2 diabetes mellitus without complications: Secondary | ICD-10-CM | POA: Diagnosis not present

## 2013-02-02 DIAGNOSIS — D510 Vitamin B12 deficiency anemia due to intrinsic factor deficiency: Secondary | ICD-10-CM | POA: Diagnosis not present

## 2013-02-02 DIAGNOSIS — I4891 Unspecified atrial fibrillation: Secondary | ICD-10-CM | POA: Diagnosis not present

## 2013-02-03 ENCOUNTER — Encounter: Payer: Self-pay | Admitting: Surgery

## 2013-02-03 DIAGNOSIS — I87319 Chronic venous hypertension (idiopathic) with ulcer of unspecified lower extremity: Secondary | ICD-10-CM | POA: Diagnosis not present

## 2013-02-03 DIAGNOSIS — L97209 Non-pressure chronic ulcer of unspecified calf with unspecified severity: Secondary | ICD-10-CM | POA: Diagnosis not present

## 2013-02-06 DIAGNOSIS — E119 Type 2 diabetes mellitus without complications: Secondary | ICD-10-CM | POA: Diagnosis not present

## 2013-02-06 DIAGNOSIS — D510 Vitamin B12 deficiency anemia due to intrinsic factor deficiency: Secondary | ICD-10-CM | POA: Diagnosis not present

## 2013-02-06 DIAGNOSIS — I951 Orthostatic hypotension: Secondary | ICD-10-CM | POA: Diagnosis not present

## 2013-02-06 DIAGNOSIS — I4891 Unspecified atrial fibrillation: Secondary | ICD-10-CM | POA: Diagnosis not present

## 2013-02-06 DIAGNOSIS — L089 Local infection of the skin and subcutaneous tissue, unspecified: Secondary | ICD-10-CM | POA: Diagnosis not present

## 2013-02-07 DIAGNOSIS — I4891 Unspecified atrial fibrillation: Secondary | ICD-10-CM | POA: Diagnosis not present

## 2013-02-07 DIAGNOSIS — D510 Vitamin B12 deficiency anemia due to intrinsic factor deficiency: Secondary | ICD-10-CM | POA: Diagnosis not present

## 2013-02-07 DIAGNOSIS — E119 Type 2 diabetes mellitus without complications: Secondary | ICD-10-CM | POA: Diagnosis not present

## 2013-02-07 DIAGNOSIS — I951 Orthostatic hypotension: Secondary | ICD-10-CM | POA: Diagnosis not present

## 2013-02-07 DIAGNOSIS — L089 Local infection of the skin and subcutaneous tissue, unspecified: Secondary | ICD-10-CM | POA: Diagnosis not present

## 2013-02-08 DIAGNOSIS — I951 Orthostatic hypotension: Secondary | ICD-10-CM | POA: Diagnosis not present

## 2013-02-08 DIAGNOSIS — I4891 Unspecified atrial fibrillation: Secondary | ICD-10-CM | POA: Diagnosis not present

## 2013-02-08 DIAGNOSIS — L089 Local infection of the skin and subcutaneous tissue, unspecified: Secondary | ICD-10-CM | POA: Diagnosis not present

## 2013-02-08 DIAGNOSIS — E119 Type 2 diabetes mellitus without complications: Secondary | ICD-10-CM | POA: Diagnosis not present

## 2013-02-08 DIAGNOSIS — D510 Vitamin B12 deficiency anemia due to intrinsic factor deficiency: Secondary | ICD-10-CM | POA: Diagnosis not present

## 2013-02-09 DIAGNOSIS — I951 Orthostatic hypotension: Secondary | ICD-10-CM | POA: Diagnosis not present

## 2013-02-09 DIAGNOSIS — D510 Vitamin B12 deficiency anemia due to intrinsic factor deficiency: Secondary | ICD-10-CM | POA: Diagnosis not present

## 2013-02-09 DIAGNOSIS — I4891 Unspecified atrial fibrillation: Secondary | ICD-10-CM | POA: Diagnosis not present

## 2013-02-09 DIAGNOSIS — L089 Local infection of the skin and subcutaneous tissue, unspecified: Secondary | ICD-10-CM | POA: Diagnosis not present

## 2013-02-09 DIAGNOSIS — E119 Type 2 diabetes mellitus without complications: Secondary | ICD-10-CM | POA: Diagnosis not present

## 2013-02-10 DIAGNOSIS — I951 Orthostatic hypotension: Secondary | ICD-10-CM | POA: Diagnosis not present

## 2013-02-10 DIAGNOSIS — D510 Vitamin B12 deficiency anemia due to intrinsic factor deficiency: Secondary | ICD-10-CM | POA: Diagnosis not present

## 2013-02-10 DIAGNOSIS — L089 Local infection of the skin and subcutaneous tissue, unspecified: Secondary | ICD-10-CM | POA: Diagnosis not present

## 2013-02-10 DIAGNOSIS — I4891 Unspecified atrial fibrillation: Secondary | ICD-10-CM | POA: Diagnosis not present

## 2013-02-10 DIAGNOSIS — E119 Type 2 diabetes mellitus without complications: Secondary | ICD-10-CM | POA: Diagnosis not present

## 2013-02-13 DIAGNOSIS — L089 Local infection of the skin and subcutaneous tissue, unspecified: Secondary | ICD-10-CM | POA: Diagnosis not present

## 2013-02-13 DIAGNOSIS — E119 Type 2 diabetes mellitus without complications: Secondary | ICD-10-CM | POA: Diagnosis not present

## 2013-02-13 DIAGNOSIS — I951 Orthostatic hypotension: Secondary | ICD-10-CM | POA: Diagnosis not present

## 2013-02-13 DIAGNOSIS — I4891 Unspecified atrial fibrillation: Secondary | ICD-10-CM | POA: Diagnosis not present

## 2013-02-13 DIAGNOSIS — D510 Vitamin B12 deficiency anemia due to intrinsic factor deficiency: Secondary | ICD-10-CM | POA: Diagnosis not present

## 2013-02-15 DIAGNOSIS — I951 Orthostatic hypotension: Secondary | ICD-10-CM | POA: Diagnosis not present

## 2013-02-15 DIAGNOSIS — L089 Local infection of the skin and subcutaneous tissue, unspecified: Secondary | ICD-10-CM | POA: Diagnosis not present

## 2013-02-15 DIAGNOSIS — D510 Vitamin B12 deficiency anemia due to intrinsic factor deficiency: Secondary | ICD-10-CM | POA: Diagnosis not present

## 2013-02-15 DIAGNOSIS — E119 Type 2 diabetes mellitus without complications: Secondary | ICD-10-CM | POA: Diagnosis not present

## 2013-02-15 DIAGNOSIS — I4891 Unspecified atrial fibrillation: Secondary | ICD-10-CM | POA: Diagnosis not present

## 2013-02-17 DIAGNOSIS — L089 Local infection of the skin and subcutaneous tissue, unspecified: Secondary | ICD-10-CM | POA: Diagnosis not present

## 2013-02-17 DIAGNOSIS — I951 Orthostatic hypotension: Secondary | ICD-10-CM | POA: Diagnosis not present

## 2013-02-17 DIAGNOSIS — E119 Type 2 diabetes mellitus without complications: Secondary | ICD-10-CM | POA: Diagnosis not present

## 2013-02-17 DIAGNOSIS — I4891 Unspecified atrial fibrillation: Secondary | ICD-10-CM | POA: Diagnosis not present

## 2013-02-17 DIAGNOSIS — D510 Vitamin B12 deficiency anemia due to intrinsic factor deficiency: Secondary | ICD-10-CM | POA: Diagnosis not present

## 2013-02-20 DIAGNOSIS — D510 Vitamin B12 deficiency anemia due to intrinsic factor deficiency: Secondary | ICD-10-CM | POA: Diagnosis not present

## 2013-02-20 DIAGNOSIS — L089 Local infection of the skin and subcutaneous tissue, unspecified: Secondary | ICD-10-CM | POA: Diagnosis not present

## 2013-02-20 DIAGNOSIS — I951 Orthostatic hypotension: Secondary | ICD-10-CM | POA: Diagnosis not present

## 2013-02-20 DIAGNOSIS — E119 Type 2 diabetes mellitus without complications: Secondary | ICD-10-CM | POA: Diagnosis not present

## 2013-02-20 DIAGNOSIS — I4891 Unspecified atrial fibrillation: Secondary | ICD-10-CM | POA: Diagnosis not present

## 2013-02-22 DIAGNOSIS — L089 Local infection of the skin and subcutaneous tissue, unspecified: Secondary | ICD-10-CM | POA: Diagnosis not present

## 2013-02-22 DIAGNOSIS — D510 Vitamin B12 deficiency anemia due to intrinsic factor deficiency: Secondary | ICD-10-CM | POA: Diagnosis not present

## 2013-02-22 DIAGNOSIS — I4891 Unspecified atrial fibrillation: Secondary | ICD-10-CM | POA: Diagnosis not present

## 2013-02-22 DIAGNOSIS — E119 Type 2 diabetes mellitus without complications: Secondary | ICD-10-CM | POA: Diagnosis not present

## 2013-02-22 DIAGNOSIS — I951 Orthostatic hypotension: Secondary | ICD-10-CM | POA: Diagnosis not present

## 2013-02-24 DIAGNOSIS — I951 Orthostatic hypotension: Secondary | ICD-10-CM | POA: Diagnosis not present

## 2013-02-24 DIAGNOSIS — L089 Local infection of the skin and subcutaneous tissue, unspecified: Secondary | ICD-10-CM | POA: Diagnosis not present

## 2013-02-24 DIAGNOSIS — E119 Type 2 diabetes mellitus without complications: Secondary | ICD-10-CM | POA: Diagnosis not present

## 2013-02-24 DIAGNOSIS — I4891 Unspecified atrial fibrillation: Secondary | ICD-10-CM | POA: Diagnosis not present

## 2013-02-24 DIAGNOSIS — D510 Vitamin B12 deficiency anemia due to intrinsic factor deficiency: Secondary | ICD-10-CM | POA: Diagnosis not present

## 2013-02-27 DIAGNOSIS — I951 Orthostatic hypotension: Secondary | ICD-10-CM | POA: Diagnosis not present

## 2013-02-27 DIAGNOSIS — D510 Vitamin B12 deficiency anemia due to intrinsic factor deficiency: Secondary | ICD-10-CM | POA: Diagnosis not present

## 2013-02-27 DIAGNOSIS — I4891 Unspecified atrial fibrillation: Secondary | ICD-10-CM | POA: Diagnosis not present

## 2013-02-27 DIAGNOSIS — L089 Local infection of the skin and subcutaneous tissue, unspecified: Secondary | ICD-10-CM | POA: Diagnosis not present

## 2013-02-27 DIAGNOSIS — E119 Type 2 diabetes mellitus without complications: Secondary | ICD-10-CM | POA: Diagnosis not present

## 2013-03-01 DIAGNOSIS — E119 Type 2 diabetes mellitus without complications: Secondary | ICD-10-CM | POA: Diagnosis not present

## 2013-03-01 DIAGNOSIS — L089 Local infection of the skin and subcutaneous tissue, unspecified: Secondary | ICD-10-CM | POA: Diagnosis not present

## 2013-03-01 DIAGNOSIS — I951 Orthostatic hypotension: Secondary | ICD-10-CM | POA: Diagnosis not present

## 2013-03-01 DIAGNOSIS — D510 Vitamin B12 deficiency anemia due to intrinsic factor deficiency: Secondary | ICD-10-CM | POA: Diagnosis not present

## 2013-03-01 DIAGNOSIS — I4891 Unspecified atrial fibrillation: Secondary | ICD-10-CM | POA: Diagnosis not present

## 2013-03-02 DIAGNOSIS — D510 Vitamin B12 deficiency anemia due to intrinsic factor deficiency: Secondary | ICD-10-CM | POA: Diagnosis not present

## 2013-03-02 DIAGNOSIS — E119 Type 2 diabetes mellitus without complications: Secondary | ICD-10-CM | POA: Diagnosis not present

## 2013-03-02 DIAGNOSIS — L089 Local infection of the skin and subcutaneous tissue, unspecified: Secondary | ICD-10-CM | POA: Diagnosis not present

## 2013-03-02 DIAGNOSIS — H269 Unspecified cataract: Secondary | ICD-10-CM | POA: Diagnosis not present

## 2013-03-02 DIAGNOSIS — H4011X0 Primary open-angle glaucoma, stage unspecified: Secondary | ICD-10-CM | POA: Diagnosis not present

## 2013-03-02 DIAGNOSIS — I4891 Unspecified atrial fibrillation: Secondary | ICD-10-CM | POA: Diagnosis not present

## 2013-03-02 DIAGNOSIS — H409 Unspecified glaucoma: Secondary | ICD-10-CM | POA: Diagnosis not present

## 2013-03-02 DIAGNOSIS — I951 Orthostatic hypotension: Secondary | ICD-10-CM | POA: Diagnosis not present

## 2013-03-06 DIAGNOSIS — E119 Type 2 diabetes mellitus without complications: Secondary | ICD-10-CM | POA: Diagnosis not present

## 2013-03-06 DIAGNOSIS — L089 Local infection of the skin and subcutaneous tissue, unspecified: Secondary | ICD-10-CM | POA: Diagnosis not present

## 2013-03-06 DIAGNOSIS — D510 Vitamin B12 deficiency anemia due to intrinsic factor deficiency: Secondary | ICD-10-CM | POA: Diagnosis not present

## 2013-03-06 DIAGNOSIS — I4891 Unspecified atrial fibrillation: Secondary | ICD-10-CM | POA: Diagnosis not present

## 2013-03-06 DIAGNOSIS — I951 Orthostatic hypotension: Secondary | ICD-10-CM | POA: Diagnosis not present

## 2013-03-08 DIAGNOSIS — E119 Type 2 diabetes mellitus without complications: Secondary | ICD-10-CM | POA: Diagnosis not present

## 2013-03-08 DIAGNOSIS — D510 Vitamin B12 deficiency anemia due to intrinsic factor deficiency: Secondary | ICD-10-CM | POA: Diagnosis not present

## 2013-03-08 DIAGNOSIS — I951 Orthostatic hypotension: Secondary | ICD-10-CM | POA: Diagnosis not present

## 2013-03-08 DIAGNOSIS — I4891 Unspecified atrial fibrillation: Secondary | ICD-10-CM | POA: Diagnosis not present

## 2013-03-08 DIAGNOSIS — L089 Local infection of the skin and subcutaneous tissue, unspecified: Secondary | ICD-10-CM | POA: Diagnosis not present

## 2013-03-10 DIAGNOSIS — D510 Vitamin B12 deficiency anemia due to intrinsic factor deficiency: Secondary | ICD-10-CM | POA: Diagnosis not present

## 2013-03-10 DIAGNOSIS — I4891 Unspecified atrial fibrillation: Secondary | ICD-10-CM | POA: Diagnosis not present

## 2013-03-10 DIAGNOSIS — L089 Local infection of the skin and subcutaneous tissue, unspecified: Secondary | ICD-10-CM | POA: Diagnosis not present

## 2013-03-10 DIAGNOSIS — E119 Type 2 diabetes mellitus without complications: Secondary | ICD-10-CM | POA: Diagnosis not present

## 2013-03-10 DIAGNOSIS — I951 Orthostatic hypotension: Secondary | ICD-10-CM | POA: Diagnosis not present

## 2013-03-13 DIAGNOSIS — I951 Orthostatic hypotension: Secondary | ICD-10-CM | POA: Diagnosis not present

## 2013-03-13 DIAGNOSIS — E119 Type 2 diabetes mellitus without complications: Secondary | ICD-10-CM | POA: Diagnosis not present

## 2013-03-13 DIAGNOSIS — L089 Local infection of the skin and subcutaneous tissue, unspecified: Secondary | ICD-10-CM | POA: Diagnosis not present

## 2013-03-13 DIAGNOSIS — I4891 Unspecified atrial fibrillation: Secondary | ICD-10-CM | POA: Diagnosis not present

## 2013-03-13 DIAGNOSIS — D510 Vitamin B12 deficiency anemia due to intrinsic factor deficiency: Secondary | ICD-10-CM | POA: Diagnosis not present

## 2013-03-14 DIAGNOSIS — L089 Local infection of the skin and subcutaneous tissue, unspecified: Secondary | ICD-10-CM | POA: Diagnosis not present

## 2013-03-14 DIAGNOSIS — I951 Orthostatic hypotension: Secondary | ICD-10-CM | POA: Diagnosis not present

## 2013-03-14 DIAGNOSIS — E119 Type 2 diabetes mellitus without complications: Secondary | ICD-10-CM | POA: Diagnosis not present

## 2013-03-14 DIAGNOSIS — D510 Vitamin B12 deficiency anemia due to intrinsic factor deficiency: Secondary | ICD-10-CM | POA: Diagnosis not present

## 2013-03-14 DIAGNOSIS — I4891 Unspecified atrial fibrillation: Secondary | ICD-10-CM | POA: Diagnosis not present

## 2013-03-15 DIAGNOSIS — D510 Vitamin B12 deficiency anemia due to intrinsic factor deficiency: Secondary | ICD-10-CM | POA: Diagnosis not present

## 2013-03-15 DIAGNOSIS — E119 Type 2 diabetes mellitus without complications: Secondary | ICD-10-CM | POA: Diagnosis not present

## 2013-03-15 DIAGNOSIS — I951 Orthostatic hypotension: Secondary | ICD-10-CM | POA: Diagnosis not present

## 2013-03-15 DIAGNOSIS — L089 Local infection of the skin and subcutaneous tissue, unspecified: Secondary | ICD-10-CM | POA: Diagnosis not present

## 2013-03-15 DIAGNOSIS — I4891 Unspecified atrial fibrillation: Secondary | ICD-10-CM | POA: Diagnosis not present

## 2013-03-20 DIAGNOSIS — E119 Type 2 diabetes mellitus without complications: Secondary | ICD-10-CM | POA: Diagnosis not present

## 2013-03-20 DIAGNOSIS — I4891 Unspecified atrial fibrillation: Secondary | ICD-10-CM | POA: Diagnosis not present

## 2013-03-20 DIAGNOSIS — N6489 Other specified disorders of breast: Secondary | ICD-10-CM | POA: Diagnosis not present

## 2013-03-20 DIAGNOSIS — N63 Unspecified lump in unspecified breast: Secondary | ICD-10-CM | POA: Diagnosis not present

## 2013-03-20 DIAGNOSIS — D510 Vitamin B12 deficiency anemia due to intrinsic factor deficiency: Secondary | ICD-10-CM | POA: Diagnosis not present

## 2013-03-20 DIAGNOSIS — I951 Orthostatic hypotension: Secondary | ICD-10-CM | POA: Diagnosis not present

## 2013-03-20 DIAGNOSIS — L089 Local infection of the skin and subcutaneous tissue, unspecified: Secondary | ICD-10-CM | POA: Diagnosis not present

## 2013-03-21 DIAGNOSIS — I951 Orthostatic hypotension: Secondary | ICD-10-CM | POA: Diagnosis not present

## 2013-03-21 DIAGNOSIS — D510 Vitamin B12 deficiency anemia due to intrinsic factor deficiency: Secondary | ICD-10-CM | POA: Diagnosis not present

## 2013-03-21 DIAGNOSIS — I4891 Unspecified atrial fibrillation: Secondary | ICD-10-CM | POA: Diagnosis not present

## 2013-03-21 DIAGNOSIS — E119 Type 2 diabetes mellitus without complications: Secondary | ICD-10-CM | POA: Diagnosis not present

## 2013-03-21 DIAGNOSIS — L089 Local infection of the skin and subcutaneous tissue, unspecified: Secondary | ICD-10-CM | POA: Diagnosis not present

## 2013-03-22 DIAGNOSIS — D510 Vitamin B12 deficiency anemia due to intrinsic factor deficiency: Secondary | ICD-10-CM | POA: Diagnosis not present

## 2013-03-22 DIAGNOSIS — I4891 Unspecified atrial fibrillation: Secondary | ICD-10-CM | POA: Diagnosis not present

## 2013-03-22 DIAGNOSIS — L089 Local infection of the skin and subcutaneous tissue, unspecified: Secondary | ICD-10-CM | POA: Diagnosis not present

## 2013-03-22 DIAGNOSIS — I951 Orthostatic hypotension: Secondary | ICD-10-CM | POA: Diagnosis not present

## 2013-03-22 DIAGNOSIS — E119 Type 2 diabetes mellitus without complications: Secondary | ICD-10-CM | POA: Diagnosis not present

## 2013-03-25 DIAGNOSIS — E119 Type 2 diabetes mellitus without complications: Secondary | ICD-10-CM | POA: Diagnosis not present

## 2013-03-25 DIAGNOSIS — I951 Orthostatic hypotension: Secondary | ICD-10-CM | POA: Diagnosis not present

## 2013-03-25 DIAGNOSIS — L089 Local infection of the skin and subcutaneous tissue, unspecified: Secondary | ICD-10-CM | POA: Diagnosis not present

## 2013-03-25 DIAGNOSIS — I4891 Unspecified atrial fibrillation: Secondary | ICD-10-CM | POA: Diagnosis not present

## 2013-03-25 DIAGNOSIS — D510 Vitamin B12 deficiency anemia due to intrinsic factor deficiency: Secondary | ICD-10-CM | POA: Diagnosis not present

## 2013-03-27 DIAGNOSIS — I951 Orthostatic hypotension: Secondary | ICD-10-CM | POA: Diagnosis not present

## 2013-03-27 DIAGNOSIS — E119 Type 2 diabetes mellitus without complications: Secondary | ICD-10-CM | POA: Diagnosis not present

## 2013-03-27 DIAGNOSIS — I4891 Unspecified atrial fibrillation: Secondary | ICD-10-CM | POA: Diagnosis not present

## 2013-03-27 DIAGNOSIS — L089 Local infection of the skin and subcutaneous tissue, unspecified: Secondary | ICD-10-CM | POA: Diagnosis not present

## 2013-03-27 DIAGNOSIS — D510 Vitamin B12 deficiency anemia due to intrinsic factor deficiency: Secondary | ICD-10-CM | POA: Diagnosis not present

## 2013-03-29 DIAGNOSIS — I4891 Unspecified atrial fibrillation: Secondary | ICD-10-CM | POA: Diagnosis not present

## 2013-03-29 DIAGNOSIS — I951 Orthostatic hypotension: Secondary | ICD-10-CM | POA: Diagnosis not present

## 2013-03-29 DIAGNOSIS — L089 Local infection of the skin and subcutaneous tissue, unspecified: Secondary | ICD-10-CM | POA: Diagnosis not present

## 2013-03-29 DIAGNOSIS — D510 Vitamin B12 deficiency anemia due to intrinsic factor deficiency: Secondary | ICD-10-CM | POA: Diagnosis not present

## 2013-03-29 DIAGNOSIS — E119 Type 2 diabetes mellitus without complications: Secondary | ICD-10-CM | POA: Diagnosis not present

## 2013-03-29 DIAGNOSIS — B351 Tinea unguium: Secondary | ICD-10-CM | POA: Diagnosis not present

## 2013-03-31 DIAGNOSIS — E119 Type 2 diabetes mellitus without complications: Secondary | ICD-10-CM | POA: Diagnosis not present

## 2013-03-31 DIAGNOSIS — I951 Orthostatic hypotension: Secondary | ICD-10-CM | POA: Diagnosis not present

## 2013-03-31 DIAGNOSIS — L089 Local infection of the skin and subcutaneous tissue, unspecified: Secondary | ICD-10-CM | POA: Diagnosis not present

## 2013-03-31 DIAGNOSIS — D510 Vitamin B12 deficiency anemia due to intrinsic factor deficiency: Secondary | ICD-10-CM | POA: Diagnosis not present

## 2013-03-31 DIAGNOSIS — I4891 Unspecified atrial fibrillation: Secondary | ICD-10-CM | POA: Diagnosis not present

## 2013-04-03 DIAGNOSIS — L089 Local infection of the skin and subcutaneous tissue, unspecified: Secondary | ICD-10-CM | POA: Diagnosis not present

## 2013-04-03 DIAGNOSIS — E119 Type 2 diabetes mellitus without complications: Secondary | ICD-10-CM | POA: Diagnosis not present

## 2013-04-03 DIAGNOSIS — I951 Orthostatic hypotension: Secondary | ICD-10-CM | POA: Diagnosis not present

## 2013-04-03 DIAGNOSIS — I4891 Unspecified atrial fibrillation: Secondary | ICD-10-CM | POA: Diagnosis not present

## 2013-04-03 DIAGNOSIS — D510 Vitamin B12 deficiency anemia due to intrinsic factor deficiency: Secondary | ICD-10-CM | POA: Diagnosis not present

## 2013-04-05 DIAGNOSIS — I951 Orthostatic hypotension: Secondary | ICD-10-CM | POA: Diagnosis not present

## 2013-04-05 DIAGNOSIS — E119 Type 2 diabetes mellitus without complications: Secondary | ICD-10-CM | POA: Diagnosis not present

## 2013-04-05 DIAGNOSIS — I4891 Unspecified atrial fibrillation: Secondary | ICD-10-CM | POA: Diagnosis not present

## 2013-04-05 DIAGNOSIS — D510 Vitamin B12 deficiency anemia due to intrinsic factor deficiency: Secondary | ICD-10-CM | POA: Diagnosis not present

## 2013-04-05 DIAGNOSIS — L089 Local infection of the skin and subcutaneous tissue, unspecified: Secondary | ICD-10-CM | POA: Diagnosis not present

## 2013-04-06 DIAGNOSIS — D510 Vitamin B12 deficiency anemia due to intrinsic factor deficiency: Secondary | ICD-10-CM | POA: Diagnosis not present

## 2013-04-06 DIAGNOSIS — I10 Essential (primary) hypertension: Secondary | ICD-10-CM | POA: Diagnosis not present

## 2013-04-06 DIAGNOSIS — E119 Type 2 diabetes mellitus without complications: Secondary | ICD-10-CM | POA: Diagnosis not present

## 2013-04-06 DIAGNOSIS — L089 Local infection of the skin and subcutaneous tissue, unspecified: Secondary | ICD-10-CM | POA: Diagnosis not present

## 2013-04-06 DIAGNOSIS — E7800 Pure hypercholesterolemia, unspecified: Secondary | ICD-10-CM | POA: Diagnosis not present

## 2013-04-06 DIAGNOSIS — I4891 Unspecified atrial fibrillation: Secondary | ICD-10-CM | POA: Diagnosis not present

## 2013-04-06 DIAGNOSIS — I951 Orthostatic hypotension: Secondary | ICD-10-CM | POA: Diagnosis not present

## 2013-04-07 DIAGNOSIS — L089 Local infection of the skin and subcutaneous tissue, unspecified: Secondary | ICD-10-CM | POA: Diagnosis not present

## 2013-04-07 DIAGNOSIS — I951 Orthostatic hypotension: Secondary | ICD-10-CM | POA: Diagnosis not present

## 2013-04-07 DIAGNOSIS — D510 Vitamin B12 deficiency anemia due to intrinsic factor deficiency: Secondary | ICD-10-CM | POA: Diagnosis not present

## 2013-04-07 DIAGNOSIS — I4891 Unspecified atrial fibrillation: Secondary | ICD-10-CM | POA: Diagnosis not present

## 2013-04-07 DIAGNOSIS — E119 Type 2 diabetes mellitus without complications: Secondary | ICD-10-CM | POA: Diagnosis not present

## 2013-04-10 DIAGNOSIS — I4891 Unspecified atrial fibrillation: Secondary | ICD-10-CM | POA: Diagnosis not present

## 2013-04-10 DIAGNOSIS — L089 Local infection of the skin and subcutaneous tissue, unspecified: Secondary | ICD-10-CM | POA: Diagnosis not present

## 2013-04-10 DIAGNOSIS — I951 Orthostatic hypotension: Secondary | ICD-10-CM | POA: Diagnosis not present

## 2013-04-10 DIAGNOSIS — E119 Type 2 diabetes mellitus without complications: Secondary | ICD-10-CM | POA: Diagnosis not present

## 2013-04-10 DIAGNOSIS — D510 Vitamin B12 deficiency anemia due to intrinsic factor deficiency: Secondary | ICD-10-CM | POA: Diagnosis not present

## 2013-04-12 DIAGNOSIS — D510 Vitamin B12 deficiency anemia due to intrinsic factor deficiency: Secondary | ICD-10-CM | POA: Diagnosis not present

## 2013-04-12 DIAGNOSIS — I951 Orthostatic hypotension: Secondary | ICD-10-CM | POA: Diagnosis not present

## 2013-04-12 DIAGNOSIS — I4891 Unspecified atrial fibrillation: Secondary | ICD-10-CM | POA: Diagnosis not present

## 2013-04-12 DIAGNOSIS — E119 Type 2 diabetes mellitus without complications: Secondary | ICD-10-CM | POA: Diagnosis not present

## 2013-04-12 DIAGNOSIS — L089 Local infection of the skin and subcutaneous tissue, unspecified: Secondary | ICD-10-CM | POA: Diagnosis not present

## 2013-04-17 DIAGNOSIS — H1030 Unspecified acute conjunctivitis, unspecified eye: Secondary | ICD-10-CM | POA: Diagnosis not present

## 2013-04-18 DIAGNOSIS — D510 Vitamin B12 deficiency anemia due to intrinsic factor deficiency: Secondary | ICD-10-CM | POA: Diagnosis not present

## 2013-04-18 DIAGNOSIS — L089 Local infection of the skin and subcutaneous tissue, unspecified: Secondary | ICD-10-CM | POA: Diagnosis not present

## 2013-04-18 DIAGNOSIS — E119 Type 2 diabetes mellitus without complications: Secondary | ICD-10-CM | POA: Diagnosis not present

## 2013-04-18 DIAGNOSIS — I951 Orthostatic hypotension: Secondary | ICD-10-CM | POA: Diagnosis not present

## 2013-04-18 DIAGNOSIS — I4891 Unspecified atrial fibrillation: Secondary | ICD-10-CM | POA: Diagnosis not present

## 2013-04-19 DIAGNOSIS — I951 Orthostatic hypotension: Secondary | ICD-10-CM | POA: Diagnosis not present

## 2013-04-19 DIAGNOSIS — E119 Type 2 diabetes mellitus without complications: Secondary | ICD-10-CM | POA: Diagnosis not present

## 2013-04-19 DIAGNOSIS — I4891 Unspecified atrial fibrillation: Secondary | ICD-10-CM | POA: Diagnosis not present

## 2013-04-19 DIAGNOSIS — D510 Vitamin B12 deficiency anemia due to intrinsic factor deficiency: Secondary | ICD-10-CM | POA: Diagnosis not present

## 2013-04-19 DIAGNOSIS — L089 Local infection of the skin and subcutaneous tissue, unspecified: Secondary | ICD-10-CM | POA: Diagnosis not present

## 2013-04-20 DIAGNOSIS — C50412 Malignant neoplasm of upper-outer quadrant of left female breast: Secondary | ICD-10-CM

## 2013-04-20 HISTORY — DX: Malignant neoplasm of upper-outer quadrant of left female breast: C50.412

## 2013-04-21 DIAGNOSIS — I4891 Unspecified atrial fibrillation: Secondary | ICD-10-CM | POA: Diagnosis not present

## 2013-04-21 DIAGNOSIS — L089 Local infection of the skin and subcutaneous tissue, unspecified: Secondary | ICD-10-CM | POA: Diagnosis not present

## 2013-04-21 DIAGNOSIS — E119 Type 2 diabetes mellitus without complications: Secondary | ICD-10-CM | POA: Diagnosis not present

## 2013-04-21 DIAGNOSIS — I951 Orthostatic hypotension: Secondary | ICD-10-CM | POA: Diagnosis not present

## 2013-04-21 DIAGNOSIS — D510 Vitamin B12 deficiency anemia due to intrinsic factor deficiency: Secondary | ICD-10-CM | POA: Diagnosis not present

## 2013-04-24 DIAGNOSIS — I4891 Unspecified atrial fibrillation: Secondary | ICD-10-CM | POA: Diagnosis not present

## 2013-04-24 DIAGNOSIS — D510 Vitamin B12 deficiency anemia due to intrinsic factor deficiency: Secondary | ICD-10-CM | POA: Diagnosis not present

## 2013-04-24 DIAGNOSIS — E119 Type 2 diabetes mellitus without complications: Secondary | ICD-10-CM | POA: Diagnosis not present

## 2013-04-24 DIAGNOSIS — L089 Local infection of the skin and subcutaneous tissue, unspecified: Secondary | ICD-10-CM | POA: Diagnosis not present

## 2013-04-24 DIAGNOSIS — I951 Orthostatic hypotension: Secondary | ICD-10-CM | POA: Diagnosis not present

## 2013-04-26 DIAGNOSIS — D510 Vitamin B12 deficiency anemia due to intrinsic factor deficiency: Secondary | ICD-10-CM | POA: Diagnosis not present

## 2013-04-26 DIAGNOSIS — I4891 Unspecified atrial fibrillation: Secondary | ICD-10-CM | POA: Diagnosis not present

## 2013-04-26 DIAGNOSIS — E119 Type 2 diabetes mellitus without complications: Secondary | ICD-10-CM | POA: Diagnosis not present

## 2013-04-26 DIAGNOSIS — L089 Local infection of the skin and subcutaneous tissue, unspecified: Secondary | ICD-10-CM | POA: Diagnosis not present

## 2013-04-26 DIAGNOSIS — I951 Orthostatic hypotension: Secondary | ICD-10-CM | POA: Diagnosis not present

## 2013-04-28 DIAGNOSIS — E119 Type 2 diabetes mellitus without complications: Secondary | ICD-10-CM | POA: Diagnosis not present

## 2013-04-28 DIAGNOSIS — I4891 Unspecified atrial fibrillation: Secondary | ICD-10-CM | POA: Diagnosis not present

## 2013-04-28 DIAGNOSIS — D510 Vitamin B12 deficiency anemia due to intrinsic factor deficiency: Secondary | ICD-10-CM | POA: Diagnosis not present

## 2013-04-28 DIAGNOSIS — L089 Local infection of the skin and subcutaneous tissue, unspecified: Secondary | ICD-10-CM | POA: Diagnosis not present

## 2013-04-28 DIAGNOSIS — I951 Orthostatic hypotension: Secondary | ICD-10-CM | POA: Diagnosis not present

## 2013-05-01 DIAGNOSIS — L089 Local infection of the skin and subcutaneous tissue, unspecified: Secondary | ICD-10-CM | POA: Diagnosis not present

## 2013-05-01 DIAGNOSIS — I951 Orthostatic hypotension: Secondary | ICD-10-CM | POA: Diagnosis not present

## 2013-05-01 DIAGNOSIS — D510 Vitamin B12 deficiency anemia due to intrinsic factor deficiency: Secondary | ICD-10-CM | POA: Diagnosis not present

## 2013-05-01 DIAGNOSIS — E119 Type 2 diabetes mellitus without complications: Secondary | ICD-10-CM | POA: Diagnosis not present

## 2013-05-01 DIAGNOSIS — I4891 Unspecified atrial fibrillation: Secondary | ICD-10-CM | POA: Diagnosis not present

## 2013-05-02 DIAGNOSIS — L089 Local infection of the skin and subcutaneous tissue, unspecified: Secondary | ICD-10-CM | POA: Diagnosis not present

## 2013-05-02 DIAGNOSIS — I951 Orthostatic hypotension: Secondary | ICD-10-CM | POA: Diagnosis not present

## 2013-05-02 DIAGNOSIS — D510 Vitamin B12 deficiency anemia due to intrinsic factor deficiency: Secondary | ICD-10-CM | POA: Diagnosis not present

## 2013-05-02 DIAGNOSIS — E119 Type 2 diabetes mellitus without complications: Secondary | ICD-10-CM | POA: Diagnosis not present

## 2013-05-02 DIAGNOSIS — I4891 Unspecified atrial fibrillation: Secondary | ICD-10-CM | POA: Diagnosis not present

## 2013-05-03 ENCOUNTER — Ambulatory Visit: Payer: Medicare Other | Admitting: Cardiovascular Disease

## 2013-05-03 DIAGNOSIS — E119 Type 2 diabetes mellitus without complications: Secondary | ICD-10-CM | POA: Diagnosis not present

## 2013-05-03 DIAGNOSIS — D510 Vitamin B12 deficiency anemia due to intrinsic factor deficiency: Secondary | ICD-10-CM | POA: Diagnosis not present

## 2013-05-03 DIAGNOSIS — I951 Orthostatic hypotension: Secondary | ICD-10-CM | POA: Diagnosis not present

## 2013-05-03 DIAGNOSIS — I4891 Unspecified atrial fibrillation: Secondary | ICD-10-CM | POA: Diagnosis not present

## 2013-05-03 DIAGNOSIS — L089 Local infection of the skin and subcutaneous tissue, unspecified: Secondary | ICD-10-CM | POA: Diagnosis not present

## 2013-05-04 ENCOUNTER — Ambulatory Visit: Payer: Medicare Other | Admitting: Cardiovascular Disease

## 2013-05-05 DIAGNOSIS — I951 Orthostatic hypotension: Secondary | ICD-10-CM | POA: Diagnosis not present

## 2013-05-05 DIAGNOSIS — D510 Vitamin B12 deficiency anemia due to intrinsic factor deficiency: Secondary | ICD-10-CM | POA: Diagnosis not present

## 2013-05-05 DIAGNOSIS — I4891 Unspecified atrial fibrillation: Secondary | ICD-10-CM | POA: Diagnosis not present

## 2013-05-05 DIAGNOSIS — L089 Local infection of the skin and subcutaneous tissue, unspecified: Secondary | ICD-10-CM | POA: Diagnosis not present

## 2013-05-05 DIAGNOSIS — E119 Type 2 diabetes mellitus without complications: Secondary | ICD-10-CM | POA: Diagnosis not present

## 2013-05-08 DIAGNOSIS — E119 Type 2 diabetes mellitus without complications: Secondary | ICD-10-CM | POA: Diagnosis not present

## 2013-05-08 DIAGNOSIS — L089 Local infection of the skin and subcutaneous tissue, unspecified: Secondary | ICD-10-CM | POA: Diagnosis not present

## 2013-05-08 DIAGNOSIS — D510 Vitamin B12 deficiency anemia due to intrinsic factor deficiency: Secondary | ICD-10-CM | POA: Diagnosis not present

## 2013-05-08 DIAGNOSIS — I951 Orthostatic hypotension: Secondary | ICD-10-CM | POA: Diagnosis not present

## 2013-05-08 DIAGNOSIS — I4891 Unspecified atrial fibrillation: Secondary | ICD-10-CM | POA: Diagnosis not present

## 2013-05-10 DIAGNOSIS — D510 Vitamin B12 deficiency anemia due to intrinsic factor deficiency: Secondary | ICD-10-CM | POA: Diagnosis not present

## 2013-05-10 DIAGNOSIS — L089 Local infection of the skin and subcutaneous tissue, unspecified: Secondary | ICD-10-CM | POA: Diagnosis not present

## 2013-05-10 DIAGNOSIS — E119 Type 2 diabetes mellitus without complications: Secondary | ICD-10-CM | POA: Diagnosis not present

## 2013-05-10 DIAGNOSIS — I4891 Unspecified atrial fibrillation: Secondary | ICD-10-CM | POA: Diagnosis not present

## 2013-05-10 DIAGNOSIS — I951 Orthostatic hypotension: Secondary | ICD-10-CM | POA: Diagnosis not present

## 2013-05-11 ENCOUNTER — Encounter: Payer: Self-pay | Admitting: *Deleted

## 2013-05-12 DIAGNOSIS — L089 Local infection of the skin and subcutaneous tissue, unspecified: Secondary | ICD-10-CM | POA: Diagnosis not present

## 2013-05-12 DIAGNOSIS — I4891 Unspecified atrial fibrillation: Secondary | ICD-10-CM | POA: Diagnosis not present

## 2013-05-12 DIAGNOSIS — D510 Vitamin B12 deficiency anemia due to intrinsic factor deficiency: Secondary | ICD-10-CM | POA: Diagnosis not present

## 2013-05-12 DIAGNOSIS — E119 Type 2 diabetes mellitus without complications: Secondary | ICD-10-CM | POA: Diagnosis not present

## 2013-05-12 DIAGNOSIS — I951 Orthostatic hypotension: Secondary | ICD-10-CM | POA: Diagnosis not present

## 2013-05-15 DIAGNOSIS — E119 Type 2 diabetes mellitus without complications: Secondary | ICD-10-CM | POA: Diagnosis not present

## 2013-05-15 DIAGNOSIS — L089 Local infection of the skin and subcutaneous tissue, unspecified: Secondary | ICD-10-CM | POA: Diagnosis not present

## 2013-05-15 DIAGNOSIS — I951 Orthostatic hypotension: Secondary | ICD-10-CM | POA: Diagnosis not present

## 2013-05-15 DIAGNOSIS — D510 Vitamin B12 deficiency anemia due to intrinsic factor deficiency: Secondary | ICD-10-CM | POA: Diagnosis not present

## 2013-05-15 DIAGNOSIS — I4891 Unspecified atrial fibrillation: Secondary | ICD-10-CM | POA: Diagnosis not present

## 2013-05-16 ENCOUNTER — Ambulatory Visit (INDEPENDENT_AMBULATORY_CARE_PROVIDER_SITE_OTHER): Payer: Medicare Other | Admitting: General Surgery

## 2013-05-16 ENCOUNTER — Encounter: Payer: Self-pay | Admitting: General Surgery

## 2013-05-16 VITALS — BP 138/70 | HR 68 | Resp 16 | Ht 63.0 in | Wt 203.0 lb

## 2013-05-16 DIAGNOSIS — I951 Orthostatic hypotension: Secondary | ICD-10-CM | POA: Diagnosis not present

## 2013-05-16 DIAGNOSIS — I4891 Unspecified atrial fibrillation: Secondary | ICD-10-CM | POA: Diagnosis not present

## 2013-05-16 DIAGNOSIS — D510 Vitamin B12 deficiency anemia due to intrinsic factor deficiency: Secondary | ICD-10-CM | POA: Diagnosis not present

## 2013-05-16 DIAGNOSIS — N63 Unspecified lump in unspecified breast: Secondary | ICD-10-CM

## 2013-05-16 DIAGNOSIS — E119 Type 2 diabetes mellitus without complications: Secondary | ICD-10-CM | POA: Diagnosis not present

## 2013-05-16 DIAGNOSIS — L089 Local infection of the skin and subcutaneous tissue, unspecified: Secondary | ICD-10-CM | POA: Diagnosis not present

## 2013-05-16 NOTE — Progress Notes (Signed)
Patient ID: Mia Padilla, female   DOB: 07-29-1934, 79 y.o.   MRN: [id number]  Chief Complaint  Patient presents with  . Other    mammogram    HPI Mia Padilla is a 79 y.o. female who presents for a breast evaluation. The most recent left breast mammogram and ultrasound was done on 03/20/14. Patient does perform regular self breast checks and has not received a mammogram in the last year.  Recent exam by Dr. Jeananne Padilla -noted a mass in left breast uoq. Mammogram and US done after  HPI  Past Medical History  Diagnosis Date  . Hypotension   . DM type 2 (diabetes mellitus, type 2)   . History of seizures   . Obesity   . Debilitated   . Hypertension   . Thyroid disease   . Hyperlipidemia   . Bowel incontinence   . Urinary incontinence   . Atrial fibrillation, new onset   . Glaucoma   . Black head   . Arthritis     hands  . Carpal tunnel syndrome     Past Surgical History  Procedure Laterality Date  . Partial hysterectomy    . Tonsillectomy    . Umbilical hernia repair    . Carpal tunnel release Right 30 years   . Bowel resection  2005    History reviewed. No pertinent family history.  Social History History  Substance Use Topics  . Smoking status: Never Smoker   . Smokeless tobacco: Never Used  . Alcohol Use: No    Allergies  Allergen Reactions  . Darvocet [Propoxyphene N-Acetaminophen]   . Percocet [Oxycodone-Acetaminophen]     hallucination    Current Outpatient Prescriptions  Medication Sig Dispense Refill  . albuterol (PROVENTIL HFA;VENTOLIN HFA) 108 (90 BASE) MCG/ACT inhaler Inhale 2 puffs into the lungs every 6 (six) hours as needed.      Marland Kitchen aspirin EC 81 MG tablet Take 81 mg by mouth daily.      . brimonidine (ALPHAGAN P) 0.1 % SOLN       . brinzolamide (AZOPT) 1 % ophthalmic suspension Apply 1 drop to eye 3 (three) times daily.      . cephALEXin (KEFLEX) 500 MG capsule Take 500 mg by mouth 4 (four) times daily. 7 days.      . clopidogrel (PLAVIX) 75 MG  tablet Take 75 mg by mouth daily.      . digoxin (LANOXIN) 0.125 MG tablet Take 125 mcg by mouth daily.      . dorzolamide (TRUSOPT) 2 % ophthalmic solution 1 drop 3 (three) times daily.      . fluticasone (FLONASE) 50 MCG/ACT nasal spray Place 2 sprays into the nose daily.      . Fluticasone-Salmeterol (ADVAIR) 250-50 MCG/DOSE AEPB Inhale 1 puff into the lungs every 12 (twelve) hours.      . furosemide (LASIX) 20 MG tablet Take 1 tablet (20 mg total) by mouth daily as needed.  30 tablet  3  . guaiFENesin (MUCINEX) 600 MG 12 hr tablet Take 1,200 mg by mouth 2 (two) times daily.      . insulin aspart (NOVOLOG) 100 UNIT/ML injection Inject into the skin. Sliding scale      . insulin detemir (LEVEMIR) 100 UNIT/ML injection Inject 23 Units into the skin at bedtime.      Marland Kitchen levothyroxine (SYNTHROID, LEVOTHROID) 75 MCG tablet Take 75 mcg by mouth daily.      . meclizine (ANTIVERT) 25 MG tablet Take 25 mg  by mouth 3 (three) times daily as needed.      . metFORMIN (GLUCOPHAGE) 500 MG tablet Take 500 mg by mouth 2 (two) times daily with a meal.      . potassium chloride (K-DUR) 10 MEQ tablet Take 1 tablet (10 mEq total) by mouth daily as needed.  30 tablet  3  . simvastatin (ZOCOR) 40 MG tablet Take 40 mg by mouth every evening.      . solifenacin (VESICARE) 5 MG tablet Take 10 mg by mouth daily.      . travoprost, benzalkonium, (TRAVATAN) 0.004 % ophthalmic solution Apply 1 drop to eye at bedtime.      Marland Kitchen lisinopril (PRINIVIL,ZESTRIL) 10 MG tablet Take 1 tablet (10 mg total) by mouth daily.  30 tablet  6  . metoprolol succinate (TOPROL-XL) 100 MG 24 hr tablet Take 100 mg by mouth daily.        No current facility-administered medications for this visit.    Review of Systems Review of Systems  Constitutional: Negative.   Respiratory: Negative.   Cardiovascular: Negative.     Blood pressure 138/70, pulse 68, resp. rate 16, height 5\' 3"  (1.6 m), weight 203 lb (92.08 kg).  Physical Exam Physical  Exam  Constitutional: She is oriented to person, place, and time. She appears well-nourished.  Eyes: Conjunctivae are normal.  Neck: Neck supple.  Cardiovascular: Normal rate, regular rhythm and normal heart sounds.   Pulmonary/Chest: Effort normal and breath sounds normal. Right breast exhibits no inverted nipple, no mass, no nipple discharge, no skin change and no tenderness. Left breast exhibits no inverted nipple, no mass, no nipple discharge, no skin change and no tenderness.  Lymphadenopathy:    She has no cervical adenopathy.    She has no axillary adenopathy.  Neurological: She is alert and oriented to person, place, and time.  Skin: Skin is warm and dry.    Data Reviewed Mammogram was normal and the ultrasound left breast showed a 73mm irregular shaped  nodule at 1 o'clock. Also a suspicious lymph node in left axilla.   Assessment   Left breast mass and axilla node  Core biopsy is reasonable  Pt advised and is agreeable.     Plan    Patient to return after stopping her Plavix 5 days before the biopsy. Will get clearance from her cardiologist for stopping Plavix.       Mia Padilla 05/17/2013, 6:17 AM

## 2013-05-16 NOTE — Patient Instructions (Addendum)
Patient to stop her Plaxis for 5 days. Prior to procedure. Core biopsy explained to her.

## 2013-05-17 ENCOUNTER — Encounter: Payer: Self-pay | Admitting: General Surgery

## 2013-05-17 DIAGNOSIS — E119 Type 2 diabetes mellitus without complications: Secondary | ICD-10-CM | POA: Diagnosis not present

## 2013-05-17 DIAGNOSIS — I4891 Unspecified atrial fibrillation: Secondary | ICD-10-CM | POA: Diagnosis not present

## 2013-05-17 DIAGNOSIS — D510 Vitamin B12 deficiency anemia due to intrinsic factor deficiency: Secondary | ICD-10-CM | POA: Diagnosis not present

## 2013-05-17 DIAGNOSIS — L089 Local infection of the skin and subcutaneous tissue, unspecified: Secondary | ICD-10-CM | POA: Diagnosis not present

## 2013-05-17 DIAGNOSIS — I951 Orthostatic hypotension: Secondary | ICD-10-CM | POA: Diagnosis not present

## 2013-05-18 ENCOUNTER — Ambulatory Visit (INDEPENDENT_AMBULATORY_CARE_PROVIDER_SITE_OTHER): Payer: Medicare Other | Admitting: Cardiovascular Disease

## 2013-05-18 ENCOUNTER — Encounter: Payer: Self-pay | Admitting: Cardiovascular Disease

## 2013-05-18 VITALS — BP 135/68 | HR 76 | Ht 64.0 in | Wt 201.0 lb

## 2013-05-18 DIAGNOSIS — I10 Essential (primary) hypertension: Secondary | ICD-10-CM

## 2013-05-18 DIAGNOSIS — E669 Obesity, unspecified: Secondary | ICD-10-CM | POA: Diagnosis not present

## 2013-05-18 DIAGNOSIS — I4891 Unspecified atrial fibrillation: Secondary | ICD-10-CM | POA: Diagnosis not present

## 2013-05-18 DIAGNOSIS — I509 Heart failure, unspecified: Secondary | ICD-10-CM

## 2013-05-18 DIAGNOSIS — I5030 Unspecified diastolic (congestive) heart failure: Secondary | ICD-10-CM

## 2013-05-18 DIAGNOSIS — E119 Type 2 diabetes mellitus without complications: Secondary | ICD-10-CM

## 2013-05-18 DIAGNOSIS — J449 Chronic obstructive pulmonary disease, unspecified: Secondary | ICD-10-CM

## 2013-05-18 NOTE — Assessment & Plan Note (Signed)
Encouraged her to closely watch her diet.

## 2013-05-18 NOTE — Progress Notes (Signed)
Patient ID: Mia Padilla, female    DOB: December 08, 1934, 79 y.o.   MRN: [id number]  HPI Comments: Mia Padilla is a 79 year old woman with history of stroke, seizure disorder, hypertension, COPD, diabetes, hypothyroidism, obesity, atrial fibrillation on recent hospital admission April 04 2011 for mental status changes. During her hospital course, she had an echocardiogram showing normal LV function, carotid ultrasound showing no significant disease, head CT and MRI showing no significant stroke. She reports having a "code" called on her in the hospital.  Notes indicate possible TIA. She was started on warfarin for atrial fibrillation   In followup today, she reports that She is a 79 year old woman with history of stroke She reports blood pressure at home is typically well controlled. Recent blood pressure to see Mia Padilla showed systolic pressures in the 130s. Otherwise she denies any new symptoms, no new complaints.  She is scheduled for breast biopsy with Mia Padilla in the near future.  Chronic severe bilateral knee pain and hip pain and walks with a walker outside the house. Uses a cane inside the house. She denies any significant shortness of breath or chest pain.  Previous leg edema seems to have significantly improved.  Coumadin was stopped in the past secondary to unsteady gait by Mia Padilla. She was started on Plavix 10/13/2011. She denies any recent falls.   Notes indicate that she has had a history of seizures, possibly with low glucose levels. Previous diagnosis of complex partial seizures and she was started on antiseizure medications. Weaned off Keppra  Lab work shows hemoglobin A1c 7.8, total cholesterol 155, LDL 91, HDL 44, TSH 4.1, creatinine 0.69, BUN 10  EKG shows normal sinus rhythm with rate 76 beats per minute, no significant ST or T wave changes   Outpatient Encounter Prescriptions as of 05/18/2013  Medication Sig  . acetaminophen (TYLENOL) 500 MG tablet Take 500 mg by mouth every 6 (six) hours as needed.   Marland Kitchen albuterol (PROVENTIL HFA;VENTOLIN HFA) 108 (90 BASE) MCG/ACT inhaler Inhale 2 puffs into the lungs every 6 (six) hours as needed.  Marland Kitchen alendronate (FOSAMAX) 70 MG tablet Take 70 mg by mouth once a week. Take with a full glass of water on an empty stomach.  Marland Kitchen aspirin EC 81 MG tablet Take 81 mg by mouth daily.  . brimonidine (ALPHAGAN P) 0.1 % SOLN   . brinzolamide (AZOPT) 1 % ophthalmic suspension Apply 1 drop to eye 3 (three) times daily.  . Canagliflozin (INVOKANA) 100 MG TABS Take 100 mg by mouth daily.  . clopidogrel (PLAVIX) 75 MG tablet Take 75 mg by mouth daily.  . digoxin (LANOXIN) 0.125 MG tablet Take 125 mcg by mouth daily.  . dorzolamide (TRUSOPT) 2 % ophthalmic solution 1 drop 3 (three) times daily.  . fluticasone (FLONASE) 50 MCG/ACT nasal spray Place 2 sprays into the nose daily.  . Fluticasone-Salmeterol (ADVAIR) 250-50 MCG/DOSE AEPB Inhale 1 puff into the lungs every 12 (twelve) hours.  Marland Kitchen guaiFENesin (MUCINEX) 600 MG 12 hr tablet Take 1,200 mg by mouth 2 (two) times daily.  . insulin aspart (NOVOLOG) 100 UNIT/ML injection Inject into the skin. Sliding scale  . insulin detemir (LEVEMIR) 100 UNIT/ML injection Inject 23 Units into the skin at bedtime.  Marland Kitchen levothyroxine (SYNTHROID, LEVOTHROID) 75 MCG tablet Take 75 mcg by mouth daily.  Marland Kitchen lisinopril (PRINIVIL,ZESTRIL) 10 MG tablet Take 1 tablet (10 mg total) by mouth daily.  . meclizine (ANTIVERT) 25 MG tablet Take 25 mg by mouth 3 (three) times daily as needed.  . metFORMIN (  GLUCOPHAGE) 500 MG tablet Take 500 mg by mouth 2 (two) times daily with a meal.  . metoprolol succinate (TOPROL-XL) 100 MG 24 hr tablet Take 100 mg by mouth daily.   . solifenacin (VESICARE) 5 MG tablet Take 10 mg by mouth daily.  . travoprost, benzalkonium, (TRAVATAN) 0.004 % ophthalmic solution Apply 1 drop to eye at bedtime.    Review of Systems  Constitutional: Negative.   HENT: Negative.   Eyes: Negative.   Respiratory: Negative.   Cardiovascular:  Negative.   Gastrointestinal: Negative.   Musculoskeletal: Positive for arthralgias, back pain, gait problem and joint swelling.  Skin: Negative.   Neurological: Negative.   Psychiatric/Behavioral: Negative.   All other systems reviewed and are negative.   BP 162/68  Pulse 76  Ht 5\' 4"  (1.626 m)  Wt 201 lb (91.173 kg)  BMI 34.48 kg/m2 Repeat blood pressure shows systolic 737 Physical Exam  Nursing note and vitals reviewed. Constitutional: She is oriented to person, place, and time. She appears well-developed and well-nourished.  Obese, walks with a walker  HENT:  Head: Normocephalic.  Nose: Nose normal.  Mouth/Throat: Oropharynx is clear and moist.  Eyes: Conjunctivae are normal. Pupils are equal, round, and reactive to light.  Neck: Normal range of motion. Neck supple. No JVD present.  Cardiovascular: Normal rate, regular rhythm, S1 normal, S2 normal and intact distal pulses.  Exam reveals no gallop and no friction rub.   Murmur heard.  Crescendo systolic murmur is present with a grade of 2/6  Pulmonary/Chest: Effort normal and breath sounds normal. No respiratory distress. She has no wheezes. She has no rales. She exhibits no tenderness.  Abdominal: Soft. Bowel sounds are normal. She exhibits no distension. There is no tenderness.  Musculoskeletal: Normal range of motion. She exhibits no edema and no tenderness.  Lymphadenopathy:    She has no cervical adenopathy.  Neurological: She is alert and oriented to person, place, and time. Coordination normal.  Skin: Skin is warm and dry. No rash noted. No erythema.  Psychiatric: She has a normal mood and affect. Her behavior is normal. Judgment and thought content normal.    Assessment and Plan

## 2013-05-18 NOTE — Assessment & Plan Note (Signed)
Appears to be relatively stable on her current inhaler regimen

## 2013-05-18 NOTE — Assessment & Plan Note (Signed)
Appears relatively euvolemic on her visit today. No significant edema. Appears comfortable. No changes to her medications

## 2013-05-18 NOTE — Assessment & Plan Note (Signed)
Blood pressure is well controlled on today's visit. No changes made to the medications. 

## 2013-05-18 NOTE — Patient Instructions (Signed)
You are doing well. No medication changes were made.  Please call us if you have new issues that need to be addressed before your next appt.  Your physician wants you to follow-up in: 6 months.  You will receive a reminder letter in the mail two months in advance. If you don't receive a letter, please call our office to schedule the follow-up appointment.   

## 2013-05-18 NOTE — Assessment & Plan Note (Addendum)
Appears to be maintaining normal sinus rhythm. Not on anticoagulation As she is a fall risk. no further medication changes made. She is on Plavix. This will be held for upcoming breast biopsy with Dr. Jamal Collin.

## 2013-05-18 NOTE — Assessment & Plan Note (Signed)
We have encouraged continued exercise, careful diet management in an effort to lose weight. 

## 2013-05-19 DIAGNOSIS — L089 Local infection of the skin and subcutaneous tissue, unspecified: Secondary | ICD-10-CM | POA: Diagnosis not present

## 2013-05-19 DIAGNOSIS — I951 Orthostatic hypotension: Secondary | ICD-10-CM | POA: Diagnosis not present

## 2013-05-19 DIAGNOSIS — D510 Vitamin B12 deficiency anemia due to intrinsic factor deficiency: Secondary | ICD-10-CM | POA: Diagnosis not present

## 2013-05-19 DIAGNOSIS — I4891 Unspecified atrial fibrillation: Secondary | ICD-10-CM | POA: Diagnosis not present

## 2013-05-19 DIAGNOSIS — E119 Type 2 diabetes mellitus without complications: Secondary | ICD-10-CM | POA: Diagnosis not present

## 2013-05-21 DIAGNOSIS — D510 Vitamin B12 deficiency anemia due to intrinsic factor deficiency: Secondary | ICD-10-CM | POA: Diagnosis not present

## 2013-05-21 DIAGNOSIS — I4891 Unspecified atrial fibrillation: Secondary | ICD-10-CM | POA: Diagnosis not present

## 2013-05-21 DIAGNOSIS — L089 Local infection of the skin and subcutaneous tissue, unspecified: Secondary | ICD-10-CM | POA: Diagnosis not present

## 2013-05-21 DIAGNOSIS — I951 Orthostatic hypotension: Secondary | ICD-10-CM | POA: Diagnosis not present

## 2013-05-21 DIAGNOSIS — E119 Type 2 diabetes mellitus without complications: Secondary | ICD-10-CM | POA: Diagnosis not present

## 2013-05-22 DIAGNOSIS — I951 Orthostatic hypotension: Secondary | ICD-10-CM | POA: Diagnosis not present

## 2013-05-22 DIAGNOSIS — D510 Vitamin B12 deficiency anemia due to intrinsic factor deficiency: Secondary | ICD-10-CM | POA: Diagnosis not present

## 2013-05-22 DIAGNOSIS — I4891 Unspecified atrial fibrillation: Secondary | ICD-10-CM | POA: Diagnosis not present

## 2013-05-22 DIAGNOSIS — L089 Local infection of the skin and subcutaneous tissue, unspecified: Secondary | ICD-10-CM | POA: Diagnosis not present

## 2013-05-22 DIAGNOSIS — E119 Type 2 diabetes mellitus without complications: Secondary | ICD-10-CM | POA: Diagnosis not present

## 2013-05-23 DIAGNOSIS — D510 Vitamin B12 deficiency anemia due to intrinsic factor deficiency: Secondary | ICD-10-CM | POA: Diagnosis not present

## 2013-05-23 DIAGNOSIS — I4891 Unspecified atrial fibrillation: Secondary | ICD-10-CM | POA: Diagnosis not present

## 2013-05-23 DIAGNOSIS — I951 Orthostatic hypotension: Secondary | ICD-10-CM | POA: Diagnosis not present

## 2013-05-23 DIAGNOSIS — E119 Type 2 diabetes mellitus without complications: Secondary | ICD-10-CM | POA: Diagnosis not present

## 2013-05-23 DIAGNOSIS — L089 Local infection of the skin and subcutaneous tissue, unspecified: Secondary | ICD-10-CM | POA: Diagnosis not present

## 2013-05-24 DIAGNOSIS — L089 Local infection of the skin and subcutaneous tissue, unspecified: Secondary | ICD-10-CM | POA: Diagnosis not present

## 2013-05-24 DIAGNOSIS — D510 Vitamin B12 deficiency anemia due to intrinsic factor deficiency: Secondary | ICD-10-CM | POA: Diagnosis not present

## 2013-05-24 DIAGNOSIS — I4891 Unspecified atrial fibrillation: Secondary | ICD-10-CM | POA: Diagnosis not present

## 2013-05-24 DIAGNOSIS — E119 Type 2 diabetes mellitus without complications: Secondary | ICD-10-CM | POA: Diagnosis not present

## 2013-05-24 DIAGNOSIS — I951 Orthostatic hypotension: Secondary | ICD-10-CM | POA: Diagnosis not present

## 2013-05-26 DIAGNOSIS — E119 Type 2 diabetes mellitus without complications: Secondary | ICD-10-CM | POA: Diagnosis not present

## 2013-05-26 DIAGNOSIS — D510 Vitamin B12 deficiency anemia due to intrinsic factor deficiency: Secondary | ICD-10-CM | POA: Diagnosis not present

## 2013-05-26 DIAGNOSIS — I951 Orthostatic hypotension: Secondary | ICD-10-CM | POA: Diagnosis not present

## 2013-05-26 DIAGNOSIS — L089 Local infection of the skin and subcutaneous tissue, unspecified: Secondary | ICD-10-CM | POA: Diagnosis not present

## 2013-05-26 DIAGNOSIS — I4891 Unspecified atrial fibrillation: Secondary | ICD-10-CM | POA: Diagnosis not present

## 2013-05-29 DIAGNOSIS — L089 Local infection of the skin and subcutaneous tissue, unspecified: Secondary | ICD-10-CM | POA: Diagnosis not present

## 2013-05-29 DIAGNOSIS — E119 Type 2 diabetes mellitus without complications: Secondary | ICD-10-CM | POA: Diagnosis not present

## 2013-05-29 DIAGNOSIS — D510 Vitamin B12 deficiency anemia due to intrinsic factor deficiency: Secondary | ICD-10-CM | POA: Diagnosis not present

## 2013-05-29 DIAGNOSIS — I4891 Unspecified atrial fibrillation: Secondary | ICD-10-CM | POA: Diagnosis not present

## 2013-05-29 DIAGNOSIS — I951 Orthostatic hypotension: Secondary | ICD-10-CM | POA: Diagnosis not present

## 2013-05-30 DIAGNOSIS — E119 Type 2 diabetes mellitus without complications: Secondary | ICD-10-CM | POA: Diagnosis not present

## 2013-05-30 DIAGNOSIS — D510 Vitamin B12 deficiency anemia due to intrinsic factor deficiency: Secondary | ICD-10-CM | POA: Diagnosis not present

## 2013-05-30 DIAGNOSIS — L089 Local infection of the skin and subcutaneous tissue, unspecified: Secondary | ICD-10-CM | POA: Diagnosis not present

## 2013-05-30 DIAGNOSIS — I4891 Unspecified atrial fibrillation: Secondary | ICD-10-CM | POA: Diagnosis not present

## 2013-05-30 DIAGNOSIS — I951 Orthostatic hypotension: Secondary | ICD-10-CM | POA: Diagnosis not present

## 2013-06-02 DIAGNOSIS — I951 Orthostatic hypotension: Secondary | ICD-10-CM | POA: Diagnosis not present

## 2013-06-02 DIAGNOSIS — L089 Local infection of the skin and subcutaneous tissue, unspecified: Secondary | ICD-10-CM | POA: Diagnosis not present

## 2013-06-02 DIAGNOSIS — I4891 Unspecified atrial fibrillation: Secondary | ICD-10-CM | POA: Diagnosis not present

## 2013-06-02 DIAGNOSIS — D510 Vitamin B12 deficiency anemia due to intrinsic factor deficiency: Secondary | ICD-10-CM | POA: Diagnosis not present

## 2013-06-02 DIAGNOSIS — E119 Type 2 diabetes mellitus without complications: Secondary | ICD-10-CM | POA: Diagnosis not present

## 2013-06-05 DIAGNOSIS — D510 Vitamin B12 deficiency anemia due to intrinsic factor deficiency: Secondary | ICD-10-CM | POA: Diagnosis not present

## 2013-06-05 DIAGNOSIS — E119 Type 2 diabetes mellitus without complications: Secondary | ICD-10-CM | POA: Diagnosis not present

## 2013-06-05 DIAGNOSIS — I951 Orthostatic hypotension: Secondary | ICD-10-CM | POA: Diagnosis not present

## 2013-06-05 DIAGNOSIS — I4891 Unspecified atrial fibrillation: Secondary | ICD-10-CM | POA: Diagnosis not present

## 2013-06-05 DIAGNOSIS — L089 Local infection of the skin and subcutaneous tissue, unspecified: Secondary | ICD-10-CM | POA: Diagnosis not present

## 2013-06-07 DIAGNOSIS — I951 Orthostatic hypotension: Secondary | ICD-10-CM | POA: Diagnosis not present

## 2013-06-07 DIAGNOSIS — D510 Vitamin B12 deficiency anemia due to intrinsic factor deficiency: Secondary | ICD-10-CM | POA: Diagnosis not present

## 2013-06-07 DIAGNOSIS — E119 Type 2 diabetes mellitus without complications: Secondary | ICD-10-CM | POA: Diagnosis not present

## 2013-06-07 DIAGNOSIS — L089 Local infection of the skin and subcutaneous tissue, unspecified: Secondary | ICD-10-CM | POA: Diagnosis not present

## 2013-06-07 DIAGNOSIS — I4891 Unspecified atrial fibrillation: Secondary | ICD-10-CM | POA: Diagnosis not present

## 2013-06-09 DIAGNOSIS — E119 Type 2 diabetes mellitus without complications: Secondary | ICD-10-CM | POA: Diagnosis not present

## 2013-06-09 DIAGNOSIS — D510 Vitamin B12 deficiency anemia due to intrinsic factor deficiency: Secondary | ICD-10-CM | POA: Diagnosis not present

## 2013-06-09 DIAGNOSIS — I951 Orthostatic hypotension: Secondary | ICD-10-CM | POA: Diagnosis not present

## 2013-06-09 DIAGNOSIS — I4891 Unspecified atrial fibrillation: Secondary | ICD-10-CM | POA: Diagnosis not present

## 2013-06-09 DIAGNOSIS — L089 Local infection of the skin and subcutaneous tissue, unspecified: Secondary | ICD-10-CM | POA: Diagnosis not present

## 2013-06-12 DIAGNOSIS — E119 Type 2 diabetes mellitus without complications: Secondary | ICD-10-CM | POA: Diagnosis not present

## 2013-06-12 DIAGNOSIS — I4891 Unspecified atrial fibrillation: Secondary | ICD-10-CM | POA: Diagnosis not present

## 2013-06-12 DIAGNOSIS — I951 Orthostatic hypotension: Secondary | ICD-10-CM | POA: Diagnosis not present

## 2013-06-12 DIAGNOSIS — D510 Vitamin B12 deficiency anemia due to intrinsic factor deficiency: Secondary | ICD-10-CM | POA: Diagnosis not present

## 2013-06-12 DIAGNOSIS — L089 Local infection of the skin and subcutaneous tissue, unspecified: Secondary | ICD-10-CM | POA: Diagnosis not present

## 2013-06-14 ENCOUNTER — Other Ambulatory Visit: Payer: Self-pay | Admitting: General Surgery

## 2013-06-14 ENCOUNTER — Ambulatory Visit (INDEPENDENT_AMBULATORY_CARE_PROVIDER_SITE_OTHER): Payer: Medicare Other | Admitting: General Surgery

## 2013-06-14 ENCOUNTER — Other Ambulatory Visit (INDEPENDENT_AMBULATORY_CARE_PROVIDER_SITE_OTHER): Payer: Medicare Other

## 2013-06-14 ENCOUNTER — Encounter: Payer: Self-pay | Admitting: General Surgery

## 2013-06-14 VITALS — BP 142/82 | HR 94 | Resp 16 | Ht 64.0 in | Wt 195.0 lb

## 2013-06-14 DIAGNOSIS — N63 Unspecified lump in unspecified breast: Secondary | ICD-10-CM | POA: Diagnosis not present

## 2013-06-14 DIAGNOSIS — C50919 Malignant neoplasm of unspecified site of unspecified female breast: Secondary | ICD-10-CM | POA: Diagnosis not present

## 2013-06-14 DIAGNOSIS — D0590 Unspecified type of carcinoma in situ of unspecified breast: Secondary | ICD-10-CM | POA: Diagnosis not present

## 2013-06-14 NOTE — Patient Instructions (Signed)
  Restart plavix Sunday    CARE AFTER BREAST BIOPSY  1. Leave the dressing on that your doctor applied after surgery. It is waterproof. You may bathe, shower and/or swim. The dressing will probably remain intact until your return office visit. If the dressing comes off, you will see small strips of tape against your skin on the incision. Do not remove these strips.  2. You may want to use a gauze,cloth or similar protection in your bra to prevent rubbing against your dressing and incision. This is not necessary, but you may feel more comfortable doing so.  3. It is recommended that you wear a bra day and night to give support to the breast. This will prevent the weight of the breast from pulling on the incision.  4. Your breast will feel hard and lumpy under the incision. Do not be alarmed. This is the underlying stitching of tissue. Softening of this tissue will occur in time.  5. Make sure you call the office and schedule an appointment in one week after your surgery. The office phone number is [phone]. The nurses at [facility] may have already done this for you.  6. You will notice about a week after your office visit that the strips of the tape on your incision will begin to loosen. These may then be removed.  7. Report to your doctor any of the following:  * Severe pain not relieved by your pain medication  *Redness of the incision  * Drainage from the incision  *Fever greater than 101 degrees

## 2013-06-14 NOTE — Progress Notes (Signed)
She is here today for left breast mass biopsy. Pt is off Plavix. The mass in left breast 1 ocl was core biopsied and clip placed.  [place] of left axilla did not reveal a lymph node.

## 2013-06-16 ENCOUNTER — Encounter: Payer: Self-pay | Admitting: General Surgery

## 2013-06-16 DIAGNOSIS — D510 Vitamin B12 deficiency anemia due to intrinsic factor deficiency: Secondary | ICD-10-CM | POA: Diagnosis not present

## 2013-06-16 DIAGNOSIS — E119 Type 2 diabetes mellitus without complications: Secondary | ICD-10-CM | POA: Diagnosis not present

## 2013-06-16 DIAGNOSIS — I951 Orthostatic hypotension: Secondary | ICD-10-CM | POA: Diagnosis not present

## 2013-06-16 DIAGNOSIS — I4891 Unspecified atrial fibrillation: Secondary | ICD-10-CM | POA: Diagnosis not present

## 2013-06-16 DIAGNOSIS — L089 Local infection of the skin and subcutaneous tissue, unspecified: Secondary | ICD-10-CM | POA: Diagnosis not present

## 2013-06-19 DIAGNOSIS — I951 Orthostatic hypotension: Secondary | ICD-10-CM | POA: Diagnosis not present

## 2013-06-19 DIAGNOSIS — D510 Vitamin B12 deficiency anemia due to intrinsic factor deficiency: Secondary | ICD-10-CM | POA: Diagnosis not present

## 2013-06-19 DIAGNOSIS — L089 Local infection of the skin and subcutaneous tissue, unspecified: Secondary | ICD-10-CM | POA: Diagnosis not present

## 2013-06-19 DIAGNOSIS — I4891 Unspecified atrial fibrillation: Secondary | ICD-10-CM | POA: Diagnosis not present

## 2013-06-19 DIAGNOSIS — E119 Type 2 diabetes mellitus without complications: Secondary | ICD-10-CM | POA: Diagnosis not present

## 2013-06-21 ENCOUNTER — Telehealth: Payer: Self-pay | Admitting: *Deleted

## 2013-06-21 DIAGNOSIS — E119 Type 2 diabetes mellitus without complications: Secondary | ICD-10-CM | POA: Diagnosis not present

## 2013-06-21 DIAGNOSIS — I4891 Unspecified atrial fibrillation: Secondary | ICD-10-CM | POA: Diagnosis not present

## 2013-06-21 DIAGNOSIS — D510 Vitamin B12 deficiency anemia due to intrinsic factor deficiency: Secondary | ICD-10-CM | POA: Diagnosis not present

## 2013-06-21 DIAGNOSIS — L089 Local infection of the skin and subcutaneous tissue, unspecified: Secondary | ICD-10-CM | POA: Diagnosis not present

## 2013-06-21 DIAGNOSIS — I951 Orthostatic hypotension: Secondary | ICD-10-CM | POA: Diagnosis not present

## 2013-06-21 NOTE — Telephone Encounter (Signed)
Patient called to find out if she could take her waterproof dressing off from her breast biopsy performed on 06/16/13 in our office. Patient was instructed she could take this dressing off but to keep steri strips in place until they fall off.

## 2013-06-22 DIAGNOSIS — H4011X0 Primary open-angle glaucoma, stage unspecified: Secondary | ICD-10-CM | POA: Diagnosis not present

## 2013-06-22 DIAGNOSIS — H269 Unspecified cataract: Secondary | ICD-10-CM | POA: Diagnosis not present

## 2013-06-22 DIAGNOSIS — H409 Unspecified glaucoma: Secondary | ICD-10-CM | POA: Diagnosis not present

## 2013-06-23 DIAGNOSIS — I951 Orthostatic hypotension: Secondary | ICD-10-CM | POA: Diagnosis not present

## 2013-06-23 DIAGNOSIS — E119 Type 2 diabetes mellitus without complications: Secondary | ICD-10-CM | POA: Diagnosis not present

## 2013-06-23 DIAGNOSIS — D510 Vitamin B12 deficiency anemia due to intrinsic factor deficiency: Secondary | ICD-10-CM | POA: Diagnosis not present

## 2013-06-23 DIAGNOSIS — I4891 Unspecified atrial fibrillation: Secondary | ICD-10-CM | POA: Diagnosis not present

## 2013-06-23 DIAGNOSIS — L089 Local infection of the skin and subcutaneous tissue, unspecified: Secondary | ICD-10-CM | POA: Diagnosis not present

## 2013-06-23 NOTE — Progress Notes (Signed)
Patient has an appointment for discussion on 06-27-13 at 11:30 am.

## 2013-06-26 DIAGNOSIS — I4891 Unspecified atrial fibrillation: Secondary | ICD-10-CM | POA: Diagnosis not present

## 2013-06-26 DIAGNOSIS — L089 Local infection of the skin and subcutaneous tissue, unspecified: Secondary | ICD-10-CM | POA: Diagnosis not present

## 2013-06-26 DIAGNOSIS — D510 Vitamin B12 deficiency anemia due to intrinsic factor deficiency: Secondary | ICD-10-CM | POA: Diagnosis not present

## 2013-06-26 DIAGNOSIS — E119 Type 2 diabetes mellitus without complications: Secondary | ICD-10-CM | POA: Diagnosis not present

## 2013-06-26 DIAGNOSIS — I951 Orthostatic hypotension: Secondary | ICD-10-CM | POA: Diagnosis not present

## 2013-06-27 ENCOUNTER — Encounter: Payer: Self-pay | Admitting: General Surgery

## 2013-06-27 ENCOUNTER — Ambulatory Visit (INDEPENDENT_AMBULATORY_CARE_PROVIDER_SITE_OTHER): Payer: Medicare Other | Admitting: General Surgery

## 2013-06-27 VITALS — BP 150/74 | HR 88 | Resp 16 | Ht 64.0 in | Wt 195.0 lb

## 2013-06-27 DIAGNOSIS — C50419 Malignant neoplasm of upper-outer quadrant of unspecified female breast: Secondary | ICD-10-CM | POA: Diagnosis not present

## 2013-06-27 NOTE — Progress Notes (Signed)
Here for left breast biopsy discussion. Path report on core biopsy left breast mass revealed invasive cribriform CA and DCIS. ER/PR pos, her 2 is pending. Treatment options were discussed in full. Given her age and comorbid conditions feel she may do well with a total mastectomy and SN biopsy followed by antihormonal therapy. This will avoid radiation and possible need for chemo.  Patient will decide on surgery and notify the office when ready to proceed. She will need to stop her Plavix 5 days prior to procedure.

## 2013-06-28 ENCOUNTER — Telehealth: Payer: Self-pay | Admitting: General Surgery

## 2013-06-28 ENCOUNTER — Encounter: Payer: Self-pay | Admitting: General Surgery

## 2013-06-28 DIAGNOSIS — B351 Tinea unguium: Secondary | ICD-10-CM | POA: Diagnosis not present

## 2013-06-28 DIAGNOSIS — E119 Type 2 diabetes mellitus without complications: Secondary | ICD-10-CM | POA: Diagnosis not present

## 2013-06-28 DIAGNOSIS — C50419 Malignant neoplasm of upper-outer quadrant of unspecified female breast: Secondary | ICD-10-CM | POA: Insufficient documentation

## 2013-06-28 LAB — PATHOLOGY

## 2013-06-28 NOTE — Patient Instructions (Signed)
Pt to call with her decision.

## 2013-06-28 NOTE — Telephone Encounter (Signed)
06-28-13 PT CALLED & STATED SHE WILL CALL BACK TO [facility] HER SX FOR AROUND THE 1ST OF April 2015/MTH

## 2013-06-29 ENCOUNTER — Encounter: Payer: Self-pay | Admitting: General Surgery

## 2013-06-29 DIAGNOSIS — I4891 Unspecified atrial fibrillation: Secondary | ICD-10-CM | POA: Diagnosis not present

## 2013-06-29 DIAGNOSIS — L089 Local infection of the skin and subcutaneous tissue, unspecified: Secondary | ICD-10-CM | POA: Diagnosis not present

## 2013-06-29 DIAGNOSIS — D510 Vitamin B12 deficiency anemia due to intrinsic factor deficiency: Secondary | ICD-10-CM | POA: Diagnosis not present

## 2013-06-29 DIAGNOSIS — I951 Orthostatic hypotension: Secondary | ICD-10-CM | POA: Diagnosis not present

## 2013-06-29 DIAGNOSIS — E119 Type 2 diabetes mellitus without complications: Secondary | ICD-10-CM | POA: Diagnosis not present

## 2013-06-30 DIAGNOSIS — I951 Orthostatic hypotension: Secondary | ICD-10-CM | POA: Diagnosis not present

## 2013-06-30 DIAGNOSIS — I4891 Unspecified atrial fibrillation: Secondary | ICD-10-CM | POA: Diagnosis not present

## 2013-06-30 DIAGNOSIS — D510 Vitamin B12 deficiency anemia due to intrinsic factor deficiency: Secondary | ICD-10-CM | POA: Diagnosis not present

## 2013-06-30 DIAGNOSIS — L089 Local infection of the skin and subcutaneous tissue, unspecified: Secondary | ICD-10-CM | POA: Diagnosis not present

## 2013-06-30 DIAGNOSIS — E119 Type 2 diabetes mellitus without complications: Secondary | ICD-10-CM | POA: Diagnosis not present

## 2013-07-03 ENCOUNTER — Ambulatory Visit: Payer: Medicare Other | Admitting: General Surgery

## 2013-07-03 DIAGNOSIS — E119 Type 2 diabetes mellitus without complications: Secondary | ICD-10-CM | POA: Diagnosis not present

## 2013-07-03 DIAGNOSIS — I4891 Unspecified atrial fibrillation: Secondary | ICD-10-CM | POA: Diagnosis not present

## 2013-07-03 DIAGNOSIS — L089 Local infection of the skin and subcutaneous tissue, unspecified: Secondary | ICD-10-CM | POA: Diagnosis not present

## 2013-07-03 DIAGNOSIS — D510 Vitamin B12 deficiency anemia due to intrinsic factor deficiency: Secondary | ICD-10-CM | POA: Diagnosis not present

## 2013-07-03 DIAGNOSIS — I951 Orthostatic hypotension: Secondary | ICD-10-CM | POA: Diagnosis not present

## 2013-07-04 DIAGNOSIS — L089 Local infection of the skin and subcutaneous tissue, unspecified: Secondary | ICD-10-CM | POA: Diagnosis not present

## 2013-07-04 DIAGNOSIS — I951 Orthostatic hypotension: Secondary | ICD-10-CM | POA: Diagnosis not present

## 2013-07-04 DIAGNOSIS — D510 Vitamin B12 deficiency anemia due to intrinsic factor deficiency: Secondary | ICD-10-CM | POA: Diagnosis not present

## 2013-07-04 DIAGNOSIS — E119 Type 2 diabetes mellitus without complications: Secondary | ICD-10-CM | POA: Diagnosis not present

## 2013-07-04 DIAGNOSIS — I4891 Unspecified atrial fibrillation: Secondary | ICD-10-CM | POA: Diagnosis not present

## 2013-07-05 DIAGNOSIS — E119 Type 2 diabetes mellitus without complications: Secondary | ICD-10-CM | POA: Diagnosis not present

## 2013-07-05 DIAGNOSIS — I4891 Unspecified atrial fibrillation: Secondary | ICD-10-CM | POA: Diagnosis not present

## 2013-07-05 DIAGNOSIS — I951 Orthostatic hypotension: Secondary | ICD-10-CM | POA: Diagnosis not present

## 2013-07-05 DIAGNOSIS — L089 Local infection of the skin and subcutaneous tissue, unspecified: Secondary | ICD-10-CM | POA: Diagnosis not present

## 2013-07-05 DIAGNOSIS — D510 Vitamin B12 deficiency anemia due to intrinsic factor deficiency: Secondary | ICD-10-CM | POA: Diagnosis not present

## 2013-07-06 ENCOUNTER — Telehealth: Payer: Self-pay | Admitting: *Deleted

## 2013-07-06 NOTE — Telephone Encounter (Signed)
Patient states she is to see her PCP, Dr. Jeananne Rama, on Monday, 07-10-13 late afternoon. She will give the office a call on Tuesday to talk about a date for surgery (per her request). She has decided on a left mastectomy.

## 2013-07-07 DIAGNOSIS — I951 Orthostatic hypotension: Secondary | ICD-10-CM | POA: Diagnosis not present

## 2013-07-07 DIAGNOSIS — L089 Local infection of the skin and subcutaneous tissue, unspecified: Secondary | ICD-10-CM | POA: Diagnosis not present

## 2013-07-07 DIAGNOSIS — D510 Vitamin B12 deficiency anemia due to intrinsic factor deficiency: Secondary | ICD-10-CM | POA: Diagnosis not present

## 2013-07-07 DIAGNOSIS — E119 Type 2 diabetes mellitus without complications: Secondary | ICD-10-CM | POA: Diagnosis not present

## 2013-07-07 DIAGNOSIS — I4891 Unspecified atrial fibrillation: Secondary | ICD-10-CM | POA: Diagnosis not present

## 2013-07-10 DIAGNOSIS — D510 Vitamin B12 deficiency anemia due to intrinsic factor deficiency: Secondary | ICD-10-CM | POA: Diagnosis not present

## 2013-07-10 DIAGNOSIS — I10 Essential (primary) hypertension: Secondary | ICD-10-CM | POA: Diagnosis not present

## 2013-07-10 DIAGNOSIS — E119 Type 2 diabetes mellitus without complications: Secondary | ICD-10-CM | POA: Diagnosis not present

## 2013-07-11 DIAGNOSIS — I4891 Unspecified atrial fibrillation: Secondary | ICD-10-CM | POA: Diagnosis not present

## 2013-07-11 DIAGNOSIS — I951 Orthostatic hypotension: Secondary | ICD-10-CM | POA: Diagnosis not present

## 2013-07-11 DIAGNOSIS — L089 Local infection of the skin and subcutaneous tissue, unspecified: Secondary | ICD-10-CM | POA: Diagnosis not present

## 2013-07-11 DIAGNOSIS — D510 Vitamin B12 deficiency anemia due to intrinsic factor deficiency: Secondary | ICD-10-CM | POA: Diagnosis not present

## 2013-07-11 DIAGNOSIS — E119 Type 2 diabetes mellitus without complications: Secondary | ICD-10-CM | POA: Diagnosis not present

## 2013-07-12 ENCOUNTER — Telehealth: Payer: Self-pay | Admitting: *Deleted

## 2013-07-12 DIAGNOSIS — D510 Vitamin B12 deficiency anemia due to intrinsic factor deficiency: Secondary | ICD-10-CM | POA: Diagnosis not present

## 2013-07-12 DIAGNOSIS — L089 Local infection of the skin and subcutaneous tissue, unspecified: Secondary | ICD-10-CM | POA: Diagnosis not present

## 2013-07-12 DIAGNOSIS — I4891 Unspecified atrial fibrillation: Secondary | ICD-10-CM | POA: Diagnosis not present

## 2013-07-12 DIAGNOSIS — E119 Type 2 diabetes mellitus without complications: Secondary | ICD-10-CM | POA: Diagnosis not present

## 2013-07-12 DIAGNOSIS — I951 Orthostatic hypotension: Secondary | ICD-10-CM | POA: Diagnosis not present

## 2013-07-12 NOTE — Telephone Encounter (Signed)
Patient's surgery has been scheduled for 08-03-13 at [facility]. This patient was reminded to discontinue Plavix for 5 days prior to procedure. It is okay for her to continue her 81 mg aspirin.

## 2013-07-14 DIAGNOSIS — I4891 Unspecified atrial fibrillation: Secondary | ICD-10-CM | POA: Diagnosis not present

## 2013-07-14 DIAGNOSIS — I951 Orthostatic hypotension: Secondary | ICD-10-CM | POA: Diagnosis not present

## 2013-07-14 DIAGNOSIS — D510 Vitamin B12 deficiency anemia due to intrinsic factor deficiency: Secondary | ICD-10-CM | POA: Diagnosis not present

## 2013-07-14 DIAGNOSIS — L089 Local infection of the skin and subcutaneous tissue, unspecified: Secondary | ICD-10-CM | POA: Diagnosis not present

## 2013-07-14 DIAGNOSIS — E119 Type 2 diabetes mellitus without complications: Secondary | ICD-10-CM | POA: Diagnosis not present

## 2013-07-17 DIAGNOSIS — E119 Type 2 diabetes mellitus without complications: Secondary | ICD-10-CM | POA: Diagnosis not present

## 2013-07-17 DIAGNOSIS — D510 Vitamin B12 deficiency anemia due to intrinsic factor deficiency: Secondary | ICD-10-CM | POA: Diagnosis not present

## 2013-07-17 DIAGNOSIS — I951 Orthostatic hypotension: Secondary | ICD-10-CM | POA: Diagnosis not present

## 2013-07-17 DIAGNOSIS — L089 Local infection of the skin and subcutaneous tissue, unspecified: Secondary | ICD-10-CM | POA: Diagnosis not present

## 2013-07-17 DIAGNOSIS — I4891 Unspecified atrial fibrillation: Secondary | ICD-10-CM | POA: Diagnosis not present

## 2013-07-19 DIAGNOSIS — E119 Type 2 diabetes mellitus without complications: Secondary | ICD-10-CM | POA: Diagnosis not present

## 2013-07-19 DIAGNOSIS — I951 Orthostatic hypotension: Secondary | ICD-10-CM | POA: Diagnosis not present

## 2013-07-19 DIAGNOSIS — D510 Vitamin B12 deficiency anemia due to intrinsic factor deficiency: Secondary | ICD-10-CM | POA: Diagnosis not present

## 2013-07-19 DIAGNOSIS — I4891 Unspecified atrial fibrillation: Secondary | ICD-10-CM | POA: Diagnosis not present

## 2013-07-19 DIAGNOSIS — L089 Local infection of the skin and subcutaneous tissue, unspecified: Secondary | ICD-10-CM | POA: Diagnosis not present

## 2013-07-20 DIAGNOSIS — E119 Type 2 diabetes mellitus without complications: Secondary | ICD-10-CM | POA: Diagnosis not present

## 2013-07-20 DIAGNOSIS — I951 Orthostatic hypotension: Secondary | ICD-10-CM | POA: Diagnosis not present

## 2013-07-20 DIAGNOSIS — D510 Vitamin B12 deficiency anemia due to intrinsic factor deficiency: Secondary | ICD-10-CM | POA: Diagnosis not present

## 2013-07-20 DIAGNOSIS — I4891 Unspecified atrial fibrillation: Secondary | ICD-10-CM | POA: Diagnosis not present

## 2013-07-21 DIAGNOSIS — I951 Orthostatic hypotension: Secondary | ICD-10-CM | POA: Diagnosis not present

## 2013-07-21 DIAGNOSIS — E119 Type 2 diabetes mellitus without complications: Secondary | ICD-10-CM | POA: Diagnosis not present

## 2013-07-21 DIAGNOSIS — D510 Vitamin B12 deficiency anemia due to intrinsic factor deficiency: Secondary | ICD-10-CM | POA: Diagnosis not present

## 2013-07-21 DIAGNOSIS — I4891 Unspecified atrial fibrillation: Secondary | ICD-10-CM | POA: Diagnosis not present

## 2013-07-24 ENCOUNTER — Other Ambulatory Visit: Payer: Self-pay | Admitting: General Surgery

## 2013-07-24 DIAGNOSIS — C50419 Malignant neoplasm of upper-outer quadrant of unspecified female breast: Secondary | ICD-10-CM

## 2013-07-25 DIAGNOSIS — I4891 Unspecified atrial fibrillation: Secondary | ICD-10-CM | POA: Diagnosis not present

## 2013-07-25 DIAGNOSIS — I10 Essential (primary) hypertension: Secondary | ICD-10-CM | POA: Diagnosis not present

## 2013-07-25 DIAGNOSIS — M79609 Pain in unspecified limb: Secondary | ICD-10-CM | POA: Diagnosis not present

## 2013-07-25 DIAGNOSIS — E119 Type 2 diabetes mellitus without complications: Secondary | ICD-10-CM | POA: Diagnosis not present

## 2013-07-25 DIAGNOSIS — I951 Orthostatic hypotension: Secondary | ICD-10-CM | POA: Diagnosis not present

## 2013-07-25 DIAGNOSIS — D510 Vitamin B12 deficiency anemia due to intrinsic factor deficiency: Secondary | ICD-10-CM | POA: Diagnosis not present

## 2013-07-25 DIAGNOSIS — I6529 Occlusion and stenosis of unspecified carotid artery: Secondary | ICD-10-CM | POA: Diagnosis not present

## 2013-07-25 DIAGNOSIS — M7989 Other specified soft tissue disorders: Secondary | ICD-10-CM | POA: Diagnosis not present

## 2013-07-26 DIAGNOSIS — I4891 Unspecified atrial fibrillation: Secondary | ICD-10-CM | POA: Diagnosis not present

## 2013-07-26 DIAGNOSIS — D510 Vitamin B12 deficiency anemia due to intrinsic factor deficiency: Secondary | ICD-10-CM | POA: Diagnosis not present

## 2013-07-26 DIAGNOSIS — I951 Orthostatic hypotension: Secondary | ICD-10-CM | POA: Diagnosis not present

## 2013-07-26 DIAGNOSIS — E119 Type 2 diabetes mellitus without complications: Secondary | ICD-10-CM | POA: Diagnosis not present

## 2013-07-27 ENCOUNTER — Ambulatory Visit: Payer: Self-pay | Admitting: General Surgery

## 2013-07-27 DIAGNOSIS — Z01812 Encounter for preprocedural laboratory examination: Secondary | ICD-10-CM | POA: Diagnosis not present

## 2013-07-27 DIAGNOSIS — I10 Essential (primary) hypertension: Secondary | ICD-10-CM | POA: Diagnosis not present

## 2013-07-27 DIAGNOSIS — C50419 Malignant neoplasm of upper-outer quadrant of unspecified female breast: Secondary | ICD-10-CM | POA: Diagnosis not present

## 2013-07-27 DIAGNOSIS — Z01810 Encounter for preprocedural cardiovascular examination: Secondary | ICD-10-CM | POA: Diagnosis not present

## 2013-07-27 LAB — CBC WITH DIFFERENTIAL/PLATELET
BASOS ABS: 0 10*3/uL (ref 0.0–0.1)
BASOS PCT: 0.3 %
EOS ABS: 0.2 10*3/uL (ref 0.0–0.7)
Eosinophil %: 3.6 %
HCT: 35.8 % (ref 35.0–47.0)
HGB: 11.6 g/dL — ABNORMAL LOW (ref 12.0–16.0)
LYMPHS ABS: 1.3 10*3/uL (ref 1.0–3.6)
Lymphocyte %: 22.8 %
MCH: 29.9 pg (ref 26.0–34.0)
MCHC: 32.4 g/dL (ref 32.0–36.0)
MCV: 92 fL (ref 80–100)
MONOS PCT: 9.1 %
Monocyte #: 0.5 x10 3/mm (ref 0.2–0.9)
NEUTROS ABS: 3.7 10*3/uL (ref 1.4–6.5)
Neutrophil %: 64.2 %
Platelet: 215 10*3/uL (ref 150–440)
RBC: 3.88 10*6/uL (ref 3.80–5.20)
RDW: 14.9 % — ABNORMAL HIGH (ref 11.5–14.5)
WBC: 5.8 10*3/uL (ref 3.6–11.0)

## 2013-07-27 LAB — BASIC METABOLIC PANEL
ANION GAP: 1 — AB (ref 7–16)
BUN: 13 mg/dL (ref 7–18)
CREATININE: 0.7 mg/dL (ref 0.60–1.30)
Calcium, Total: 9.1 mg/dL (ref 8.5–10.1)
Chloride: 102 mmol/L (ref 98–107)
Co2: 30 mmol/L (ref 21–32)
EGFR (African American): >60
Glucose: 104 mg/dL — ABNORMAL HIGH (ref 65–99)
Osmolality: 267 (ref 275–301)
Potassium: 4.2 mmol/L (ref 3.5–5.1)
Sodium: 133 mmol/L — ABNORMAL LOW (ref 136–145)

## 2013-07-28 DIAGNOSIS — D510 Vitamin B12 deficiency anemia due to intrinsic factor deficiency: Secondary | ICD-10-CM | POA: Diagnosis not present

## 2013-07-28 DIAGNOSIS — I951 Orthostatic hypotension: Secondary | ICD-10-CM | POA: Diagnosis not present

## 2013-07-28 DIAGNOSIS — I4891 Unspecified atrial fibrillation: Secondary | ICD-10-CM | POA: Diagnosis not present

## 2013-07-28 DIAGNOSIS — E119 Type 2 diabetes mellitus without complications: Secondary | ICD-10-CM | POA: Diagnosis not present

## 2013-07-31 ENCOUNTER — Encounter: Payer: Self-pay | Admitting: General Surgery

## 2013-07-31 DIAGNOSIS — I4891 Unspecified atrial fibrillation: Secondary | ICD-10-CM | POA: Diagnosis not present

## 2013-07-31 DIAGNOSIS — D510 Vitamin B12 deficiency anemia due to intrinsic factor deficiency: Secondary | ICD-10-CM | POA: Diagnosis not present

## 2013-07-31 DIAGNOSIS — E119 Type 2 diabetes mellitus without complications: Secondary | ICD-10-CM | POA: Diagnosis not present

## 2013-07-31 DIAGNOSIS — I951 Orthostatic hypotension: Secondary | ICD-10-CM | POA: Diagnosis not present

## 2013-08-01 DIAGNOSIS — I951 Orthostatic hypotension: Secondary | ICD-10-CM | POA: Diagnosis not present

## 2013-08-01 DIAGNOSIS — D510 Vitamin B12 deficiency anemia due to intrinsic factor deficiency: Secondary | ICD-10-CM | POA: Diagnosis not present

## 2013-08-01 DIAGNOSIS — E119 Type 2 diabetes mellitus without complications: Secondary | ICD-10-CM | POA: Diagnosis not present

## 2013-08-01 DIAGNOSIS — I4891 Unspecified atrial fibrillation: Secondary | ICD-10-CM | POA: Diagnosis not present

## 2013-08-02 DIAGNOSIS — D510 Vitamin B12 deficiency anemia due to intrinsic factor deficiency: Secondary | ICD-10-CM | POA: Diagnosis not present

## 2013-08-02 DIAGNOSIS — I951 Orthostatic hypotension: Secondary | ICD-10-CM | POA: Diagnosis not present

## 2013-08-02 DIAGNOSIS — E119 Type 2 diabetes mellitus without complications: Secondary | ICD-10-CM | POA: Diagnosis not present

## 2013-08-02 DIAGNOSIS — I4891 Unspecified atrial fibrillation: Secondary | ICD-10-CM | POA: Diagnosis not present

## 2013-08-03 ENCOUNTER — Encounter: Payer: Self-pay | Admitting: General Surgery

## 2013-08-03 ENCOUNTER — Ambulatory Visit: Payer: Self-pay | Admitting: General Surgery

## 2013-08-03 DIAGNOSIS — J449 Chronic obstructive pulmonary disease, unspecified: Secondary | ICD-10-CM | POA: Diagnosis not present

## 2013-08-03 DIAGNOSIS — Z885 Allergy status to narcotic agent status: Secondary | ICD-10-CM | POA: Diagnosis not present

## 2013-08-03 DIAGNOSIS — C50919 Malignant neoplasm of unspecified site of unspecified female breast: Secondary | ICD-10-CM | POA: Diagnosis not present

## 2013-08-03 DIAGNOSIS — Z170 Estrogen receptor positive status [ER+]: Secondary | ICD-10-CM | POA: Diagnosis not present

## 2013-08-03 DIAGNOSIS — D0590 Unspecified type of carcinoma in situ of unspecified breast: Secondary | ICD-10-CM | POA: Diagnosis not present

## 2013-08-03 DIAGNOSIS — I10 Essential (primary) hypertension: Secondary | ICD-10-CM | POA: Diagnosis not present

## 2013-08-03 DIAGNOSIS — F329 Major depressive disorder, single episode, unspecified: Secondary | ICD-10-CM | POA: Diagnosis not present

## 2013-08-03 DIAGNOSIS — I509 Heart failure, unspecified: Secondary | ICD-10-CM | POA: Diagnosis not present

## 2013-08-03 DIAGNOSIS — E119 Type 2 diabetes mellitus without complications: Secondary | ICD-10-CM | POA: Diagnosis not present

## 2013-08-03 DIAGNOSIS — R32 Unspecified urinary incontinence: Secondary | ICD-10-CM | POA: Diagnosis not present

## 2013-08-03 DIAGNOSIS — Z7901 Long term (current) use of anticoagulants: Secondary | ICD-10-CM | POA: Diagnosis not present

## 2013-08-03 DIAGNOSIS — F3289 Other specified depressive episodes: Secondary | ICD-10-CM | POA: Diagnosis not present

## 2013-08-03 DIAGNOSIS — C50419 Malignant neoplasm of upper-outer quadrant of unspecified female breast: Secondary | ICD-10-CM | POA: Diagnosis not present

## 2013-08-03 DIAGNOSIS — L821 Other seborrheic keratosis: Secondary | ICD-10-CM | POA: Diagnosis not present

## 2013-08-03 DIAGNOSIS — Z87891 Personal history of nicotine dependence: Secondary | ICD-10-CM | POA: Diagnosis not present

## 2013-08-03 DIAGNOSIS — R011 Cardiac murmur, unspecified: Secondary | ICD-10-CM | POA: Diagnosis not present

## 2013-08-03 DIAGNOSIS — Z79899 Other long term (current) drug therapy: Secondary | ICD-10-CM | POA: Diagnosis not present

## 2013-08-03 DIAGNOSIS — M129 Arthropathy, unspecified: Secondary | ICD-10-CM | POA: Diagnosis not present

## 2013-08-03 HISTORY — PX: BREAST SURGERY: SHX581

## 2013-08-07 DIAGNOSIS — E119 Type 2 diabetes mellitus without complications: Secondary | ICD-10-CM | POA: Diagnosis not present

## 2013-08-07 DIAGNOSIS — D510 Vitamin B12 deficiency anemia due to intrinsic factor deficiency: Secondary | ICD-10-CM | POA: Diagnosis not present

## 2013-08-07 DIAGNOSIS — I951 Orthostatic hypotension: Secondary | ICD-10-CM | POA: Diagnosis not present

## 2013-08-07 DIAGNOSIS — I4891 Unspecified atrial fibrillation: Secondary | ICD-10-CM | POA: Diagnosis not present

## 2013-08-08 ENCOUNTER — Encounter: Payer: Self-pay | Admitting: General Surgery

## 2013-08-08 ENCOUNTER — Ambulatory Visit (INDEPENDENT_AMBULATORY_CARE_PROVIDER_SITE_OTHER): Payer: Medicare Other | Admitting: General Surgery

## 2013-08-08 ENCOUNTER — Ambulatory Visit: Payer: Self-pay | Admitting: General Surgery

## 2013-08-08 VITALS — BP 162/70 | HR 86 | Resp 18 | Ht 64.0 in | Wt 176.0 lb

## 2013-08-08 DIAGNOSIS — C50419 Malignant neoplasm of upper-outer quadrant of unspecified female breast: Secondary | ICD-10-CM

## 2013-08-08 NOTE — Progress Notes (Signed)
This is a 79 year old female here today for her post op left mastectomy done on 08/03/13.Patient states she is doing well.  Left mastectomy is clean and healing well.  Drainage is still above 30 ml. Old bloody drainage. Tubing was milked to get some clots out. Patient to return in one week.

## 2013-08-08 NOTE — Patient Instructions (Signed)
Patient to return in one week. 

## 2013-08-09 ENCOUNTER — Encounter: Payer: Self-pay | Admitting: General Surgery

## 2013-08-09 DIAGNOSIS — I4891 Unspecified atrial fibrillation: Secondary | ICD-10-CM | POA: Diagnosis not present

## 2013-08-09 DIAGNOSIS — I951 Orthostatic hypotension: Secondary | ICD-10-CM | POA: Diagnosis not present

## 2013-08-09 DIAGNOSIS — D510 Vitamin B12 deficiency anemia due to intrinsic factor deficiency: Secondary | ICD-10-CM | POA: Diagnosis not present

## 2013-08-09 DIAGNOSIS — E119 Type 2 diabetes mellitus without complications: Secondary | ICD-10-CM | POA: Diagnosis not present

## 2013-08-09 LAB — PATHOLOGY REPORT

## 2013-08-10 ENCOUNTER — Encounter: Payer: Self-pay | Admitting: General Surgery

## 2013-08-11 DIAGNOSIS — E119 Type 2 diabetes mellitus without complications: Secondary | ICD-10-CM | POA: Diagnosis not present

## 2013-08-11 DIAGNOSIS — I4891 Unspecified atrial fibrillation: Secondary | ICD-10-CM | POA: Diagnosis not present

## 2013-08-11 DIAGNOSIS — I951 Orthostatic hypotension: Secondary | ICD-10-CM | POA: Diagnosis not present

## 2013-08-11 DIAGNOSIS — D510 Vitamin B12 deficiency anemia due to intrinsic factor deficiency: Secondary | ICD-10-CM | POA: Diagnosis not present

## 2013-08-14 ENCOUNTER — Encounter: Payer: Self-pay | Admitting: General Surgery

## 2013-08-14 DIAGNOSIS — I4891 Unspecified atrial fibrillation: Secondary | ICD-10-CM | POA: Diagnosis not present

## 2013-08-14 DIAGNOSIS — D510 Vitamin B12 deficiency anemia due to intrinsic factor deficiency: Secondary | ICD-10-CM | POA: Diagnosis not present

## 2013-08-14 DIAGNOSIS — E119 Type 2 diabetes mellitus without complications: Secondary | ICD-10-CM | POA: Diagnosis not present

## 2013-08-14 DIAGNOSIS — I951 Orthostatic hypotension: Secondary | ICD-10-CM | POA: Diagnosis not present

## 2013-08-15 ENCOUNTER — Ambulatory Visit (INDEPENDENT_AMBULATORY_CARE_PROVIDER_SITE_OTHER): Payer: Medicare Other | Admitting: General Surgery

## 2013-08-15 ENCOUNTER — Encounter: Payer: Self-pay | Admitting: General Surgery

## 2013-08-15 VITALS — BP 148/62 | HR 90 | Resp 18 | Ht 64.0 in | Wt 196.0 lb

## 2013-08-15 DIAGNOSIS — C50419 Malignant neoplasm of upper-outer quadrant of unspecified female breast: Secondary | ICD-10-CM

## 2013-08-15 NOTE — Patient Instructions (Signed)
Patient to return in 1 week for follow up. The patient is aware to call back for any questions or concerns.  

## 2013-08-15 NOTE — Progress Notes (Signed)
Patient ID: Mia Padilla, female   DOB: 05-20-1934, 79 y.o.   MRN: [id number]  The patient presents for a post op left breast mastectomy. The procedure was performed on 08/03/13. The patient is doing well. No new complaints at this time. The patient states she is draining 40 - 60 mls daily. She forgot to bring her drain sheet to her visit.    Appears she has some clots under the flap which is draining slowly. Drainage is old blood. Tubing milked to ensure patency. Path-- T1,N0 invasive CA. ER/PR pos RTC 1week

## 2013-08-16 DIAGNOSIS — I951 Orthostatic hypotension: Secondary | ICD-10-CM | POA: Diagnosis not present

## 2013-08-16 DIAGNOSIS — I4891 Unspecified atrial fibrillation: Secondary | ICD-10-CM | POA: Diagnosis not present

## 2013-08-16 DIAGNOSIS — E119 Type 2 diabetes mellitus without complications: Secondary | ICD-10-CM | POA: Diagnosis not present

## 2013-08-16 DIAGNOSIS — D510 Vitamin B12 deficiency anemia due to intrinsic factor deficiency: Secondary | ICD-10-CM | POA: Diagnosis not present

## 2013-08-17 ENCOUNTER — Telehealth: Payer: Self-pay | Admitting: *Deleted

## 2013-08-17 NOTE — Telephone Encounter (Signed)
She called to say that the bra was rubbing a sore spot left lateral area. Advised to use neosporin and band aid or gauze pad and to try to get daughter to adjust bra so it would not rub in the same spot. Patient agrees.

## 2013-08-18 DIAGNOSIS — I951 Orthostatic hypotension: Secondary | ICD-10-CM | POA: Diagnosis not present

## 2013-08-18 DIAGNOSIS — I4891 Unspecified atrial fibrillation: Secondary | ICD-10-CM | POA: Diagnosis not present

## 2013-08-18 DIAGNOSIS — E119 Type 2 diabetes mellitus without complications: Secondary | ICD-10-CM | POA: Diagnosis not present

## 2013-08-18 DIAGNOSIS — D510 Vitamin B12 deficiency anemia due to intrinsic factor deficiency: Secondary | ICD-10-CM | POA: Diagnosis not present

## 2013-08-21 ENCOUNTER — Encounter: Payer: Self-pay | Admitting: General Surgery

## 2013-08-21 ENCOUNTER — Ambulatory Visit (INDEPENDENT_AMBULATORY_CARE_PROVIDER_SITE_OTHER): Payer: Medicare Other | Admitting: General Surgery

## 2013-08-21 VITALS — BP 130/78 | HR 78 | Resp 14 | Ht 64.0 in | Wt 199.0 lb

## 2013-08-21 DIAGNOSIS — I951 Orthostatic hypotension: Secondary | ICD-10-CM | POA: Diagnosis not present

## 2013-08-21 DIAGNOSIS — I4891 Unspecified atrial fibrillation: Secondary | ICD-10-CM | POA: Diagnosis not present

## 2013-08-21 DIAGNOSIS — C50419 Malignant neoplasm of upper-outer quadrant of unspecified female breast: Secondary | ICD-10-CM

## 2013-08-21 DIAGNOSIS — E119 Type 2 diabetes mellitus without complications: Secondary | ICD-10-CM | POA: Diagnosis not present

## 2013-08-21 DIAGNOSIS — D510 Vitamin B12 deficiency anemia due to intrinsic factor deficiency: Secondary | ICD-10-CM | POA: Diagnosis not present

## 2013-08-21 NOTE — Progress Notes (Signed)
Patient ID: Mia Padilla, female   DOB: 1934/07/09, 79 y.o.   MRN: [id number] Here for Post Op Left mastectomy done on 08/03/13. Patient reports not much pain or discomfort. She reports 50 ml or a little less drainage daily.   Still swelling in lateral half of the mastectomy site. Drainage is more serosanguinous now.  Continue with the drain and will recheck next week.

## 2013-08-23 ENCOUNTER — Ambulatory Visit (INDEPENDENT_AMBULATORY_CARE_PROVIDER_SITE_OTHER): Payer: Medicare Other | Admitting: *Deleted

## 2013-08-23 DIAGNOSIS — D510 Vitamin B12 deficiency anemia due to intrinsic factor deficiency: Secondary | ICD-10-CM | POA: Diagnosis not present

## 2013-08-23 DIAGNOSIS — I951 Orthostatic hypotension: Secondary | ICD-10-CM | POA: Diagnosis not present

## 2013-08-23 DIAGNOSIS — I4891 Unspecified atrial fibrillation: Secondary | ICD-10-CM | POA: Diagnosis not present

## 2013-08-23 DIAGNOSIS — C50419 Malignant neoplasm of upper-outer quadrant of unspecified female breast: Secondary | ICD-10-CM

## 2013-08-23 DIAGNOSIS — E119 Type 2 diabetes mellitus without complications: Secondary | ICD-10-CM | POA: Diagnosis not present

## 2013-08-23 NOTE — Progress Notes (Signed)
Here today for drain check. Drainage has been 20 ml for past couple of days. Drain removed.

## 2013-08-25 DIAGNOSIS — E119 Type 2 diabetes mellitus without complications: Secondary | ICD-10-CM | POA: Diagnosis not present

## 2013-08-25 DIAGNOSIS — I951 Orthostatic hypotension: Secondary | ICD-10-CM | POA: Diagnosis not present

## 2013-08-25 DIAGNOSIS — D510 Vitamin B12 deficiency anemia due to intrinsic factor deficiency: Secondary | ICD-10-CM | POA: Diagnosis not present

## 2013-08-25 DIAGNOSIS — I4891 Unspecified atrial fibrillation: Secondary | ICD-10-CM | POA: Diagnosis not present

## 2013-08-28 DIAGNOSIS — D510 Vitamin B12 deficiency anemia due to intrinsic factor deficiency: Secondary | ICD-10-CM | POA: Diagnosis not present

## 2013-08-28 DIAGNOSIS — E119 Type 2 diabetes mellitus without complications: Secondary | ICD-10-CM | POA: Diagnosis not present

## 2013-08-28 DIAGNOSIS — I951 Orthostatic hypotension: Secondary | ICD-10-CM | POA: Diagnosis not present

## 2013-08-28 DIAGNOSIS — I4891 Unspecified atrial fibrillation: Secondary | ICD-10-CM | POA: Diagnosis not present

## 2013-08-29 ENCOUNTER — Ambulatory Visit (INDEPENDENT_AMBULATORY_CARE_PROVIDER_SITE_OTHER): Payer: Medicare Other | Admitting: General Surgery

## 2013-08-29 ENCOUNTER — Encounter: Payer: Self-pay | Admitting: General Surgery

## 2013-08-29 VITALS — BP 142/70 | HR 70 | Resp 16 | Ht 64.0 in | Wt 198.0 lb

## 2013-08-29 DIAGNOSIS — C50419 Malignant neoplasm of upper-outer quadrant of unspecified female breast: Secondary | ICD-10-CM

## 2013-08-29 MED ORDER — ANASTROZOLE 1 MG PO TABS: 1.0000 mg | ORAL_TABLET | Freq: Every day | ORAL | Status: DC

## 2013-08-29 NOTE — Patient Instructions (Addendum)
Patient to return in 10 days. Discussed  Anastrozole .

## 2013-08-29 NOTE — Progress Notes (Signed)
This is a 79 year old female here for Post Op Left mastectomy done on 08/03/13. Patient states she is doing well.  Mastectomy site looks clean and well healed. Most of the ecchymosis has resolved.  Will check again in 10 days. Path- T1,No,Mo. ER/PR pos.Pt started on Arimidex , advised on this. Rx given also for left breast prosthesis.

## 2013-08-30 ENCOUNTER — Telehealth: Payer: Self-pay | Admitting: *Deleted

## 2013-08-30 DIAGNOSIS — E119 Type 2 diabetes mellitus without complications: Secondary | ICD-10-CM | POA: Diagnosis not present

## 2013-08-30 DIAGNOSIS — I4891 Unspecified atrial fibrillation: Secondary | ICD-10-CM | POA: Diagnosis not present

## 2013-08-30 DIAGNOSIS — I951 Orthostatic hypotension: Secondary | ICD-10-CM | POA: Diagnosis not present

## 2013-08-30 DIAGNOSIS — D510 Vitamin B12 deficiency anemia due to intrinsic factor deficiency: Secondary | ICD-10-CM | POA: Diagnosis not present

## 2013-08-30 NOTE — Telephone Encounter (Signed)
Aware to use as needed for comfort, pt agrees.

## 2013-08-30 NOTE — Telephone Encounter (Signed)
Pt called this morning and wanted to ask you when does she need to stop using the hot and cold packs, she had a LT Mastectomy 08/03/13

## 2013-08-31 DIAGNOSIS — I4891 Unspecified atrial fibrillation: Secondary | ICD-10-CM | POA: Diagnosis not present

## 2013-08-31 DIAGNOSIS — D510 Vitamin B12 deficiency anemia due to intrinsic factor deficiency: Secondary | ICD-10-CM | POA: Diagnosis not present

## 2013-08-31 DIAGNOSIS — E119 Type 2 diabetes mellitus without complications: Secondary | ICD-10-CM | POA: Diagnosis not present

## 2013-08-31 DIAGNOSIS — I951 Orthostatic hypotension: Secondary | ICD-10-CM | POA: Diagnosis not present

## 2013-09-01 DIAGNOSIS — D510 Vitamin B12 deficiency anemia due to intrinsic factor deficiency: Secondary | ICD-10-CM | POA: Diagnosis not present

## 2013-09-01 DIAGNOSIS — I4891 Unspecified atrial fibrillation: Secondary | ICD-10-CM | POA: Diagnosis not present

## 2013-09-01 DIAGNOSIS — E119 Type 2 diabetes mellitus without complications: Secondary | ICD-10-CM | POA: Diagnosis not present

## 2013-09-01 DIAGNOSIS — I951 Orthostatic hypotension: Secondary | ICD-10-CM | POA: Diagnosis not present

## 2013-09-04 DIAGNOSIS — I4891 Unspecified atrial fibrillation: Secondary | ICD-10-CM | POA: Diagnosis not present

## 2013-09-04 DIAGNOSIS — E119 Type 2 diabetes mellitus without complications: Secondary | ICD-10-CM | POA: Diagnosis not present

## 2013-09-04 DIAGNOSIS — D510 Vitamin B12 deficiency anemia due to intrinsic factor deficiency: Secondary | ICD-10-CM | POA: Diagnosis not present

## 2013-09-04 DIAGNOSIS — I951 Orthostatic hypotension: Secondary | ICD-10-CM | POA: Diagnosis not present

## 2013-09-06 ENCOUNTER — Ambulatory Visit (INDEPENDENT_AMBULATORY_CARE_PROVIDER_SITE_OTHER): Payer: Medicare Other | Admitting: General Surgery

## 2013-09-06 ENCOUNTER — Encounter: Payer: Self-pay | Admitting: General Surgery

## 2013-09-06 VITALS — BP 160/84 | HR 86 | Resp 16 | Ht 64.0 in | Wt 198.0 lb

## 2013-09-06 DIAGNOSIS — I4891 Unspecified atrial fibrillation: Secondary | ICD-10-CM | POA: Diagnosis not present

## 2013-09-06 DIAGNOSIS — D510 Vitamin B12 deficiency anemia due to intrinsic factor deficiency: Secondary | ICD-10-CM | POA: Diagnosis not present

## 2013-09-06 DIAGNOSIS — E119 Type 2 diabetes mellitus without complications: Secondary | ICD-10-CM | POA: Diagnosis not present

## 2013-09-06 DIAGNOSIS — I951 Orthostatic hypotension: Secondary | ICD-10-CM | POA: Diagnosis not present

## 2013-09-06 DIAGNOSIS — C50419 Malignant neoplasm of upper-outer quadrant of unspecified female breast: Secondary | ICD-10-CM

## 2013-09-06 NOTE — Patient Instructions (Signed)
Patient to return in 3 months

## 2013-09-06 NOTE — Progress Notes (Signed)
This is a 79 year old female here for Post Op Left mastectomy done on 08/03/13. Patient states she is doing well. No new problems. Mastectomy site looks clean and well healed. Most of the ecchymosis has resolved and no seroma noted. Pt has started on antihormonal therapy. RTC 3 mos.

## 2013-09-07 ENCOUNTER — Telehealth: Payer: Self-pay | Admitting: *Deleted

## 2013-09-07 NOTE — Telephone Encounter (Signed)
I talked with the patient about showers, pt agrees.

## 2013-09-07 NOTE — Telephone Encounter (Signed)
Pt had a LT Mastectomy and was seen in the office yesterday 09/06/13 she was told she could take a shower staring today, she was wondering could she use a bath cloth or does she just need to run water over her.

## 2013-09-08 DIAGNOSIS — E119 Type 2 diabetes mellitus without complications: Secondary | ICD-10-CM | POA: Diagnosis not present

## 2013-09-08 DIAGNOSIS — I4891 Unspecified atrial fibrillation: Secondary | ICD-10-CM | POA: Diagnosis not present

## 2013-09-08 DIAGNOSIS — I951 Orthostatic hypotension: Secondary | ICD-10-CM | POA: Diagnosis not present

## 2013-09-08 DIAGNOSIS — D510 Vitamin B12 deficiency anemia due to intrinsic factor deficiency: Secondary | ICD-10-CM | POA: Diagnosis not present

## 2013-09-11 DIAGNOSIS — I4891 Unspecified atrial fibrillation: Secondary | ICD-10-CM | POA: Diagnosis not present

## 2013-09-11 DIAGNOSIS — E119 Type 2 diabetes mellitus without complications: Secondary | ICD-10-CM | POA: Diagnosis not present

## 2013-09-11 DIAGNOSIS — D510 Vitamin B12 deficiency anemia due to intrinsic factor deficiency: Secondary | ICD-10-CM | POA: Diagnosis not present

## 2013-09-11 DIAGNOSIS — I951 Orthostatic hypotension: Secondary | ICD-10-CM | POA: Diagnosis not present

## 2013-09-13 DIAGNOSIS — I4891 Unspecified atrial fibrillation: Secondary | ICD-10-CM | POA: Diagnosis not present

## 2013-09-13 DIAGNOSIS — I951 Orthostatic hypotension: Secondary | ICD-10-CM | POA: Diagnosis not present

## 2013-09-13 DIAGNOSIS — E119 Type 2 diabetes mellitus without complications: Secondary | ICD-10-CM | POA: Diagnosis not present

## 2013-09-13 DIAGNOSIS — D510 Vitamin B12 deficiency anemia due to intrinsic factor deficiency: Secondary | ICD-10-CM | POA: Diagnosis not present

## 2013-09-14 DIAGNOSIS — D510 Vitamin B12 deficiency anemia due to intrinsic factor deficiency: Secondary | ICD-10-CM | POA: Diagnosis not present

## 2013-09-14 DIAGNOSIS — I951 Orthostatic hypotension: Secondary | ICD-10-CM | POA: Diagnosis not present

## 2013-09-14 DIAGNOSIS — I4891 Unspecified atrial fibrillation: Secondary | ICD-10-CM | POA: Diagnosis not present

## 2013-09-14 DIAGNOSIS — E119 Type 2 diabetes mellitus without complications: Secondary | ICD-10-CM | POA: Diagnosis not present

## 2013-09-15 DIAGNOSIS — D510 Vitamin B12 deficiency anemia due to intrinsic factor deficiency: Secondary | ICD-10-CM | POA: Diagnosis not present

## 2013-09-15 DIAGNOSIS — I951 Orthostatic hypotension: Secondary | ICD-10-CM | POA: Diagnosis not present

## 2013-09-15 DIAGNOSIS — E119 Type 2 diabetes mellitus without complications: Secondary | ICD-10-CM | POA: Diagnosis not present

## 2013-09-15 DIAGNOSIS — I4891 Unspecified atrial fibrillation: Secondary | ICD-10-CM | POA: Diagnosis not present

## 2013-09-18 DIAGNOSIS — I4891 Unspecified atrial fibrillation: Secondary | ICD-10-CM | POA: Diagnosis not present

## 2013-09-18 DIAGNOSIS — E119 Type 2 diabetes mellitus without complications: Secondary | ICD-10-CM | POA: Diagnosis not present

## 2013-09-18 DIAGNOSIS — I951 Orthostatic hypotension: Secondary | ICD-10-CM | POA: Diagnosis not present

## 2013-09-18 DIAGNOSIS — D510 Vitamin B12 deficiency anemia due to intrinsic factor deficiency: Secondary | ICD-10-CM | POA: Diagnosis not present

## 2013-09-19 DIAGNOSIS — I951 Orthostatic hypotension: Secondary | ICD-10-CM | POA: Diagnosis not present

## 2013-09-19 DIAGNOSIS — I4891 Unspecified atrial fibrillation: Secondary | ICD-10-CM | POA: Diagnosis not present

## 2013-09-19 DIAGNOSIS — D510 Vitamin B12 deficiency anemia due to intrinsic factor deficiency: Secondary | ICD-10-CM | POA: Diagnosis not present

## 2013-09-19 DIAGNOSIS — E119 Type 2 diabetes mellitus without complications: Secondary | ICD-10-CM | POA: Diagnosis not present

## 2013-09-21 ENCOUNTER — Encounter: Payer: Self-pay | Admitting: General Surgery

## 2013-09-21 DIAGNOSIS — I951 Orthostatic hypotension: Secondary | ICD-10-CM | POA: Diagnosis not present

## 2013-09-21 DIAGNOSIS — E119 Type 2 diabetes mellitus without complications: Secondary | ICD-10-CM | POA: Diagnosis not present

## 2013-09-21 DIAGNOSIS — I4891 Unspecified atrial fibrillation: Secondary | ICD-10-CM | POA: Diagnosis not present

## 2013-09-21 DIAGNOSIS — D510 Vitamin B12 deficiency anemia due to intrinsic factor deficiency: Secondary | ICD-10-CM | POA: Diagnosis not present

## 2013-09-26 DIAGNOSIS — D510 Vitamin B12 deficiency anemia due to intrinsic factor deficiency: Secondary | ICD-10-CM | POA: Diagnosis not present

## 2013-09-26 DIAGNOSIS — E119 Type 2 diabetes mellitus without complications: Secondary | ICD-10-CM | POA: Diagnosis not present

## 2013-09-26 DIAGNOSIS — I4891 Unspecified atrial fibrillation: Secondary | ICD-10-CM | POA: Diagnosis not present

## 2013-09-26 DIAGNOSIS — I951 Orthostatic hypotension: Secondary | ICD-10-CM | POA: Diagnosis not present

## 2013-09-27 ENCOUNTER — Telehealth: Payer: Self-pay | Admitting: *Deleted

## 2013-09-27 DIAGNOSIS — B351 Tinea unguium: Secondary | ICD-10-CM | POA: Diagnosis not present

## 2013-09-27 DIAGNOSIS — E119 Type 2 diabetes mellitus without complications: Secondary | ICD-10-CM | POA: Diagnosis not present

## 2013-09-27 NOTE — Telephone Encounter (Signed)
OK to use soap, make sure you rinse area well.

## 2013-09-27 NOTE — Telephone Encounter (Signed)
Spoke with patient and let her know it was fine to use soap and just rinse area well and to pat dry the area. Patient expresses understanding.

## 2013-09-27 NOTE — Telephone Encounter (Signed)
Pt called and Dr.Sankar did a LT Mastectomy on 08-03-13 and she wants to know can she use soap on the surgical site

## 2013-09-28 DIAGNOSIS — D510 Vitamin B12 deficiency anemia due to intrinsic factor deficiency: Secondary | ICD-10-CM | POA: Diagnosis not present

## 2013-09-28 DIAGNOSIS — E119 Type 2 diabetes mellitus without complications: Secondary | ICD-10-CM | POA: Diagnosis not present

## 2013-09-28 DIAGNOSIS — I951 Orthostatic hypotension: Secondary | ICD-10-CM | POA: Diagnosis not present

## 2013-09-28 DIAGNOSIS — I4891 Unspecified atrial fibrillation: Secondary | ICD-10-CM | POA: Diagnosis not present

## 2013-09-29 DIAGNOSIS — D510 Vitamin B12 deficiency anemia due to intrinsic factor deficiency: Secondary | ICD-10-CM | POA: Diagnosis not present

## 2013-09-29 DIAGNOSIS — I4891 Unspecified atrial fibrillation: Secondary | ICD-10-CM | POA: Diagnosis not present

## 2013-09-29 DIAGNOSIS — E119 Type 2 diabetes mellitus without complications: Secondary | ICD-10-CM | POA: Diagnosis not present

## 2013-09-29 DIAGNOSIS — I951 Orthostatic hypotension: Secondary | ICD-10-CM | POA: Diagnosis not present

## 2013-10-02 DIAGNOSIS — D510 Vitamin B12 deficiency anemia due to intrinsic factor deficiency: Secondary | ICD-10-CM | POA: Diagnosis not present

## 2013-10-02 DIAGNOSIS — E119 Type 2 diabetes mellitus without complications: Secondary | ICD-10-CM | POA: Diagnosis not present

## 2013-10-02 DIAGNOSIS — I4891 Unspecified atrial fibrillation: Secondary | ICD-10-CM | POA: Diagnosis not present

## 2013-10-02 DIAGNOSIS — I951 Orthostatic hypotension: Secondary | ICD-10-CM | POA: Diagnosis not present

## 2013-10-04 DIAGNOSIS — I951 Orthostatic hypotension: Secondary | ICD-10-CM | POA: Diagnosis not present

## 2013-10-04 DIAGNOSIS — E119 Type 2 diabetes mellitus without complications: Secondary | ICD-10-CM | POA: Diagnosis not present

## 2013-10-04 DIAGNOSIS — D510 Vitamin B12 deficiency anemia due to intrinsic factor deficiency: Secondary | ICD-10-CM | POA: Diagnosis not present

## 2013-10-04 DIAGNOSIS — I4891 Unspecified atrial fibrillation: Secondary | ICD-10-CM | POA: Diagnosis not present

## 2013-10-06 DIAGNOSIS — I4891 Unspecified atrial fibrillation: Secondary | ICD-10-CM | POA: Diagnosis not present

## 2013-10-06 DIAGNOSIS — I951 Orthostatic hypotension: Secondary | ICD-10-CM | POA: Diagnosis not present

## 2013-10-06 DIAGNOSIS — E119 Type 2 diabetes mellitus without complications: Secondary | ICD-10-CM | POA: Diagnosis not present

## 2013-10-06 DIAGNOSIS — D510 Vitamin B12 deficiency anemia due to intrinsic factor deficiency: Secondary | ICD-10-CM | POA: Diagnosis not present

## 2013-10-09 DIAGNOSIS — I951 Orthostatic hypotension: Secondary | ICD-10-CM | POA: Diagnosis not present

## 2013-10-09 DIAGNOSIS — D510 Vitamin B12 deficiency anemia due to intrinsic factor deficiency: Secondary | ICD-10-CM | POA: Diagnosis not present

## 2013-10-09 DIAGNOSIS — I4891 Unspecified atrial fibrillation: Secondary | ICD-10-CM | POA: Diagnosis not present

## 2013-10-09 DIAGNOSIS — E119 Type 2 diabetes mellitus without complications: Secondary | ICD-10-CM | POA: Diagnosis not present

## 2013-10-11 DIAGNOSIS — E119 Type 2 diabetes mellitus without complications: Secondary | ICD-10-CM | POA: Diagnosis not present

## 2013-10-11 DIAGNOSIS — D510 Vitamin B12 deficiency anemia due to intrinsic factor deficiency: Secondary | ICD-10-CM | POA: Diagnosis not present

## 2013-10-11 DIAGNOSIS — I951 Orthostatic hypotension: Secondary | ICD-10-CM | POA: Diagnosis not present

## 2013-10-11 DIAGNOSIS — I4891 Unspecified atrial fibrillation: Secondary | ICD-10-CM | POA: Diagnosis not present

## 2013-10-12 DIAGNOSIS — D510 Vitamin B12 deficiency anemia due to intrinsic factor deficiency: Secondary | ICD-10-CM | POA: Diagnosis not present

## 2013-10-12 DIAGNOSIS — E785 Hyperlipidemia, unspecified: Secondary | ICD-10-CM | POA: Diagnosis not present

## 2013-10-12 DIAGNOSIS — Z23 Encounter for immunization: Secondary | ICD-10-CM | POA: Diagnosis not present

## 2013-10-12 DIAGNOSIS — G40309 Generalized idiopathic epilepsy and epileptic syndromes, not intractable, without status epilepticus: Secondary | ICD-10-CM | POA: Diagnosis not present

## 2013-10-12 DIAGNOSIS — E7800 Pure hypercholesterolemia, unspecified: Secondary | ICD-10-CM | POA: Diagnosis not present

## 2013-10-12 DIAGNOSIS — E039 Hypothyroidism, unspecified: Secondary | ICD-10-CM | POA: Diagnosis not present

## 2013-10-12 DIAGNOSIS — I10 Essential (primary) hypertension: Secondary | ICD-10-CM | POA: Diagnosis not present

## 2013-10-12 DIAGNOSIS — Z0000 Encounter for general adult medical examination without abnormal findings: Secondary | ICD-10-CM | POA: Diagnosis not present

## 2013-10-12 DIAGNOSIS — I4891 Unspecified atrial fibrillation: Secondary | ICD-10-CM | POA: Diagnosis not present

## 2013-10-12 DIAGNOSIS — I951 Orthostatic hypotension: Secondary | ICD-10-CM | POA: Diagnosis not present

## 2013-10-12 DIAGNOSIS — E119 Type 2 diabetes mellitus without complications: Secondary | ICD-10-CM | POA: Diagnosis not present

## 2013-10-13 DIAGNOSIS — I4891 Unspecified atrial fibrillation: Secondary | ICD-10-CM | POA: Diagnosis not present

## 2013-10-13 DIAGNOSIS — D510 Vitamin B12 deficiency anemia due to intrinsic factor deficiency: Secondary | ICD-10-CM | POA: Diagnosis not present

## 2013-10-13 DIAGNOSIS — E119 Type 2 diabetes mellitus without complications: Secondary | ICD-10-CM | POA: Diagnosis not present

## 2013-10-13 DIAGNOSIS — I951 Orthostatic hypotension: Secondary | ICD-10-CM | POA: Diagnosis not present

## 2013-10-16 DIAGNOSIS — D510 Vitamin B12 deficiency anemia due to intrinsic factor deficiency: Secondary | ICD-10-CM | POA: Diagnosis not present

## 2013-10-16 DIAGNOSIS — E119 Type 2 diabetes mellitus without complications: Secondary | ICD-10-CM | POA: Diagnosis not present

## 2013-10-16 DIAGNOSIS — I4891 Unspecified atrial fibrillation: Secondary | ICD-10-CM | POA: Diagnosis not present

## 2013-10-16 DIAGNOSIS — I951 Orthostatic hypotension: Secondary | ICD-10-CM | POA: Diagnosis not present

## 2013-10-18 DIAGNOSIS — I4891 Unspecified atrial fibrillation: Secondary | ICD-10-CM | POA: Diagnosis not present

## 2013-10-18 DIAGNOSIS — E119 Type 2 diabetes mellitus without complications: Secondary | ICD-10-CM | POA: Diagnosis not present

## 2013-10-18 DIAGNOSIS — D510 Vitamin B12 deficiency anemia due to intrinsic factor deficiency: Secondary | ICD-10-CM | POA: Diagnosis not present

## 2013-10-18 DIAGNOSIS — I951 Orthostatic hypotension: Secondary | ICD-10-CM | POA: Diagnosis not present

## 2013-10-19 LAB — HER-2 / NEU, FISH
AVG NUM HER-2 SIGNALS/NUCLEUS: 2
Avg Num CEP17 probes/nucleus:: 1.9
HER-2/CEP17 Ratio: 1.08
NUMBER OF OBSERVERS: 2
NUMBER OF TUMOR CELLS COUNTED: 40

## 2013-10-19 LAB — ER/PR,IMMUNOHISTOCHEM,PARAFFIN
Estrogen Receptor IHC: >90 %
Progesterone Recp IP: 75 %

## 2013-10-19 LAB — IMMUNOHISTOCHEMICAL STAIN(S)

## 2013-10-20 DIAGNOSIS — D510 Vitamin B12 deficiency anemia due to intrinsic factor deficiency: Secondary | ICD-10-CM | POA: Diagnosis not present

## 2013-10-20 DIAGNOSIS — E119 Type 2 diabetes mellitus without complications: Secondary | ICD-10-CM | POA: Diagnosis not present

## 2013-10-20 DIAGNOSIS — I4891 Unspecified atrial fibrillation: Secondary | ICD-10-CM | POA: Diagnosis not present

## 2013-10-20 DIAGNOSIS — I951 Orthostatic hypotension: Secondary | ICD-10-CM | POA: Diagnosis not present

## 2013-10-23 DIAGNOSIS — E119 Type 2 diabetes mellitus without complications: Secondary | ICD-10-CM | POA: Diagnosis not present

## 2013-10-23 DIAGNOSIS — I951 Orthostatic hypotension: Secondary | ICD-10-CM | POA: Diagnosis not present

## 2013-10-23 DIAGNOSIS — D510 Vitamin B12 deficiency anemia due to intrinsic factor deficiency: Secondary | ICD-10-CM | POA: Diagnosis not present

## 2013-10-23 DIAGNOSIS — I4891 Unspecified atrial fibrillation: Secondary | ICD-10-CM | POA: Diagnosis not present

## 2013-10-25 DIAGNOSIS — E119 Type 2 diabetes mellitus without complications: Secondary | ICD-10-CM | POA: Diagnosis not present

## 2013-10-25 DIAGNOSIS — D510 Vitamin B12 deficiency anemia due to intrinsic factor deficiency: Secondary | ICD-10-CM | POA: Diagnosis not present

## 2013-10-25 DIAGNOSIS — I4891 Unspecified atrial fibrillation: Secondary | ICD-10-CM | POA: Diagnosis not present

## 2013-10-25 DIAGNOSIS — I951 Orthostatic hypotension: Secondary | ICD-10-CM | POA: Diagnosis not present

## 2013-10-26 DIAGNOSIS — D510 Vitamin B12 deficiency anemia due to intrinsic factor deficiency: Secondary | ICD-10-CM | POA: Diagnosis not present

## 2013-10-26 DIAGNOSIS — H269 Unspecified cataract: Secondary | ICD-10-CM | POA: Diagnosis not present

## 2013-10-26 DIAGNOSIS — H409 Unspecified glaucoma: Secondary | ICD-10-CM | POA: Diagnosis not present

## 2013-10-26 DIAGNOSIS — E119 Type 2 diabetes mellitus without complications: Secondary | ICD-10-CM | POA: Diagnosis not present

## 2013-10-26 DIAGNOSIS — I4891 Unspecified atrial fibrillation: Secondary | ICD-10-CM | POA: Diagnosis not present

## 2013-10-26 DIAGNOSIS — H4011X0 Primary open-angle glaucoma, stage unspecified: Secondary | ICD-10-CM | POA: Diagnosis not present

## 2013-10-26 DIAGNOSIS — I951 Orthostatic hypotension: Secondary | ICD-10-CM | POA: Diagnosis not present

## 2013-10-27 DIAGNOSIS — I951 Orthostatic hypotension: Secondary | ICD-10-CM | POA: Diagnosis not present

## 2013-10-27 DIAGNOSIS — E119 Type 2 diabetes mellitus without complications: Secondary | ICD-10-CM | POA: Diagnosis not present

## 2013-10-27 DIAGNOSIS — I4891 Unspecified atrial fibrillation: Secondary | ICD-10-CM | POA: Diagnosis not present

## 2013-10-27 DIAGNOSIS — D510 Vitamin B12 deficiency anemia due to intrinsic factor deficiency: Secondary | ICD-10-CM | POA: Diagnosis not present

## 2013-10-30 DIAGNOSIS — D510 Vitamin B12 deficiency anemia due to intrinsic factor deficiency: Secondary | ICD-10-CM | POA: Diagnosis not present

## 2013-10-30 DIAGNOSIS — I951 Orthostatic hypotension: Secondary | ICD-10-CM | POA: Diagnosis not present

## 2013-10-30 DIAGNOSIS — E119 Type 2 diabetes mellitus without complications: Secondary | ICD-10-CM | POA: Diagnosis not present

## 2013-10-30 DIAGNOSIS — I4891 Unspecified atrial fibrillation: Secondary | ICD-10-CM | POA: Diagnosis not present

## 2013-11-01 DIAGNOSIS — E119 Type 2 diabetes mellitus without complications: Secondary | ICD-10-CM | POA: Diagnosis not present

## 2013-11-01 DIAGNOSIS — I4891 Unspecified atrial fibrillation: Secondary | ICD-10-CM | POA: Diagnosis not present

## 2013-11-01 DIAGNOSIS — D510 Vitamin B12 deficiency anemia due to intrinsic factor deficiency: Secondary | ICD-10-CM | POA: Diagnosis not present

## 2013-11-01 DIAGNOSIS — I951 Orthostatic hypotension: Secondary | ICD-10-CM | POA: Diagnosis not present

## 2013-11-03 DIAGNOSIS — I951 Orthostatic hypotension: Secondary | ICD-10-CM | POA: Diagnosis not present

## 2013-11-03 DIAGNOSIS — I4891 Unspecified atrial fibrillation: Secondary | ICD-10-CM | POA: Diagnosis not present

## 2013-11-03 DIAGNOSIS — D510 Vitamin B12 deficiency anemia due to intrinsic factor deficiency: Secondary | ICD-10-CM | POA: Diagnosis not present

## 2013-11-03 DIAGNOSIS — E119 Type 2 diabetes mellitus without complications: Secondary | ICD-10-CM | POA: Diagnosis not present

## 2013-11-07 DIAGNOSIS — I951 Orthostatic hypotension: Secondary | ICD-10-CM | POA: Diagnosis not present

## 2013-11-07 DIAGNOSIS — E119 Type 2 diabetes mellitus without complications: Secondary | ICD-10-CM | POA: Diagnosis not present

## 2013-11-07 DIAGNOSIS — I4891 Unspecified atrial fibrillation: Secondary | ICD-10-CM | POA: Diagnosis not present

## 2013-11-07 DIAGNOSIS — D510 Vitamin B12 deficiency anemia due to intrinsic factor deficiency: Secondary | ICD-10-CM | POA: Diagnosis not present

## 2013-11-08 DIAGNOSIS — I4891 Unspecified atrial fibrillation: Secondary | ICD-10-CM | POA: Diagnosis not present

## 2013-11-08 DIAGNOSIS — I951 Orthostatic hypotension: Secondary | ICD-10-CM | POA: Diagnosis not present

## 2013-11-08 DIAGNOSIS — D510 Vitamin B12 deficiency anemia due to intrinsic factor deficiency: Secondary | ICD-10-CM | POA: Diagnosis not present

## 2013-11-08 DIAGNOSIS — E119 Type 2 diabetes mellitus without complications: Secondary | ICD-10-CM | POA: Diagnosis not present

## 2013-11-09 DIAGNOSIS — I4891 Unspecified atrial fibrillation: Secondary | ICD-10-CM | POA: Diagnosis not present

## 2013-11-09 DIAGNOSIS — D510 Vitamin B12 deficiency anemia due to intrinsic factor deficiency: Secondary | ICD-10-CM | POA: Diagnosis not present

## 2013-11-09 DIAGNOSIS — E119 Type 2 diabetes mellitus without complications: Secondary | ICD-10-CM | POA: Diagnosis not present

## 2013-11-09 DIAGNOSIS — I951 Orthostatic hypotension: Secondary | ICD-10-CM | POA: Diagnosis not present

## 2013-11-10 DIAGNOSIS — I951 Orthostatic hypotension: Secondary | ICD-10-CM | POA: Diagnosis not present

## 2013-11-10 DIAGNOSIS — D510 Vitamin B12 deficiency anemia due to intrinsic factor deficiency: Secondary | ICD-10-CM | POA: Diagnosis not present

## 2013-11-10 DIAGNOSIS — E119 Type 2 diabetes mellitus without complications: Secondary | ICD-10-CM | POA: Diagnosis not present

## 2013-11-10 DIAGNOSIS — I4891 Unspecified atrial fibrillation: Secondary | ICD-10-CM | POA: Diagnosis not present

## 2013-11-13 DIAGNOSIS — D510 Vitamin B12 deficiency anemia due to intrinsic factor deficiency: Secondary | ICD-10-CM | POA: Diagnosis not present

## 2013-11-13 DIAGNOSIS — E119 Type 2 diabetes mellitus without complications: Secondary | ICD-10-CM | POA: Diagnosis not present

## 2013-11-13 DIAGNOSIS — I4891 Unspecified atrial fibrillation: Secondary | ICD-10-CM | POA: Diagnosis not present

## 2013-11-13 DIAGNOSIS — I951 Orthostatic hypotension: Secondary | ICD-10-CM | POA: Diagnosis not present

## 2013-11-15 ENCOUNTER — Encounter: Payer: Self-pay | Admitting: Cardiovascular Disease

## 2013-11-15 ENCOUNTER — Ambulatory Visit (INDEPENDENT_AMBULATORY_CARE_PROVIDER_SITE_OTHER): Payer: Medicare Other | Admitting: Cardiovascular Disease

## 2013-11-15 VITALS — BP 136/72 | HR 94 | Ht 64.0 in | Wt 201.0 lb

## 2013-11-15 DIAGNOSIS — I4891 Unspecified atrial fibrillation: Secondary | ICD-10-CM

## 2013-11-15 DIAGNOSIS — E669 Obesity, unspecified: Secondary | ICD-10-CM | POA: Diagnosis not present

## 2013-11-15 DIAGNOSIS — I951 Orthostatic hypotension: Secondary | ICD-10-CM | POA: Diagnosis not present

## 2013-11-15 DIAGNOSIS — I10 Essential (primary) hypertension: Secondary | ICD-10-CM

## 2013-11-15 DIAGNOSIS — E118 Type 2 diabetes mellitus with unspecified complications: Secondary | ICD-10-CM | POA: Diagnosis not present

## 2013-11-15 DIAGNOSIS — C50919 Malignant neoplasm of unspecified site of unspecified female breast: Secondary | ICD-10-CM

## 2013-11-15 DIAGNOSIS — E119 Type 2 diabetes mellitus without complications: Secondary | ICD-10-CM | POA: Diagnosis not present

## 2013-11-15 DIAGNOSIS — D510 Vitamin B12 deficiency anemia due to intrinsic factor deficiency: Secondary | ICD-10-CM | POA: Diagnosis not present

## 2013-11-15 DIAGNOSIS — C50912 Malignant neoplasm of unspecified site of left female breast: Secondary | ICD-10-CM

## 2013-11-15 HISTORY — DX: Malignant neoplasm of unspecified site of unspecified female breast: C50.919

## 2013-11-15 NOTE — Assessment & Plan Note (Signed)
We have encouraged continued careful diet management in an effort to lose weight.  

## 2013-11-15 NOTE — Assessment & Plan Note (Addendum)
Maintaining  normal sinus rhythm. Rare episodes of tachycardia. We'll continue metoprolol 100 mg daily . Not on anticoagulation as she is a high fall risk

## 2013-11-15 NOTE — Assessment & Plan Note (Signed)
Recent mastectomy April 2015. Appears to have recovered well. Now on Arimidex.

## 2013-11-15 NOTE — Progress Notes (Signed)
Patient ID: Mia Padilla, female    DOB: 28-Jan-1935, 79 y.o.   MRN: [id number]  HPI Comments: [name] is a 79 year old woman with history of stroke, seizure disorder, hypertension, COPD, diabetes, hypothyroidism, obesity, atrial fibrillation on hospital admission April 04 2011 for mental status changes. During her hospital course, she had an echocardiogram showing normal LV function, carotid ultrasound showing no significant disease, head CT and MRI showing no significant stroke. She reports having a "code" called on her in the hospital.  Notes indicate possible TIA. She was started on warfarin for atrial fibrillation   In followup today, she reports that she is doing well.  She continues to have significant osteoarthritis of her knees. She underwent mastectomy 08/03/2013 for breast cancer by Dr. Jamal Collin. By her report, she tolerated this well with no complications. No radiation treatment or chemotherapy needed, she is on Arimidex.   She reports blood pressure at home is typically well controlled. Otherwise she denies any new symptoms, no new complaints.   Chronic severe bilateral knee pain and hip pain and walks with a walker outside the house. Uses a cane inside the house. She denies any significant shortness of breath or chest pain.  She does have minimal leg edema Coumadin was stopped in the past secondary to unsteady gait by Dr. Jeananne Rama. She was started on Plavix 10/13/2011. She denies any recent falls.  Periods of tachycardia in the past, better on metoprolol  Notes indicate that she has had a history of seizures, possibly with low glucose levels. Previous diagnosis of complex partial seizures and she was started on antiseizure medications. Weaned off Keppra  Lab work shows hemoglobin A1c 7.0 Previous total cholesterol 155, LDL 91, HDL 44, TSH 4.1, creatinine 0.69, BUN 10  EKG shows normal sinus rhythm with rate 94 beats per minute, no significant ST or T wave changes, PVC  noted   Outpatient Encounter Prescriptions as of 11/15/2013  Medication Sig  . acetaminophen (TYLENOL) 500 MG tablet Take 500 mg by mouth every 6 (six) hours as needed.  Marland Kitchen albuterol (PROVENTIL HFA;VENTOLIN HFA) 108 (90 BASE) MCG/ACT inhaler Inhale 2 puffs into the lungs every 6 (six) hours as needed.  Marland Kitchen alendronate (FOSAMAX) 70 MG tablet Take 70 mg by mouth once a week. Take with a full glass of water on an empty stomach.  Marland Kitchen anastrozole (ARIMIDEX) 1 MG tablet Take 1 tablet (1 mg total) by mouth daily.  Marland Kitchen aspirin EC 81 MG tablet Take 81 mg by mouth daily.  . brimonidine (ALPHAGAN P) 0.1 % SOLN   . brinzolamide (AZOPT) 1 % ophthalmic suspension Apply 1 drop to eye 3 (three) times daily.  . Canagliflozin (INVOKANA) 100 MG TABS Take 100 mg by mouth daily.  . clopidogrel (PLAVIX) 75 MG tablet Take 75 mg by mouth daily.  . digoxin (LANOXIN) 0.125 MG tablet Take 125 mcg by mouth daily.  . dorzolamide (TRUSOPT) 2 % ophthalmic solution 1 drop 3 (three) times daily.  . fluticasone (FLONASE) 50 MCG/ACT nasal spray Place 2 sprays into the nose daily.  . Fluticasone-Salmeterol (ADVAIR) 250-50 MCG/DOSE AEPB Inhale 1 puff into the lungs every 12 (twelve) hours.  Marland Kitchen guaiFENesin (MUCINEX) 600 MG 12 hr tablet Take 1,200 mg by mouth 2 (two) times daily.  . insulin detemir (LEVEMIR) 100 UNIT/ML injection Inject 23 Units into the skin at bedtime.  Marland Kitchen levothyroxine (SYNTHROID, LEVOTHROID) 75 MCG tablet Take 75 mcg by mouth daily.  Marland Kitchen lisinopril (PRINIVIL,ZESTRIL) 10 MG tablet Take 1 tablet (10  mg total) by mouth daily.  . meclizine (ANTIVERT) 25 MG tablet Take 25 mg by mouth 3 (three) times daily as needed.  . metFORMIN (GLUCOPHAGE) 500 MG tablet Take 500 mg by mouth 2 (two) times daily with a meal.  . metoprolol succinate (TOPROL-XL) 100 MG 24 hr tablet Take 100 mg by mouth daily.   . Olopatadine HCl (PATADAY) 0.2 % SOLN Apply to eye daily.  . solifenacin (VESICARE) 5 MG tablet Take 10 mg by mouth daily.  .  travoprost, benzalkonium, (TRAVATAN) 0.004 % ophthalmic solution Apply 1 drop to eye at bedtime.   Review of Systems  Constitutional: Negative.   HENT: Negative.   Eyes: Negative.   Respiratory: Negative.   Cardiovascular: Negative.   Gastrointestinal: Negative.   Endocrine: Negative.   Musculoskeletal: Positive for arthralgias, back pain, gait problem and joint swelling.  Skin: Negative.   Allergic/Immunologic: Negative.   Neurological: Negative.   Hematological: Negative.   Psychiatric/Behavioral: Negative.   All other systems reviewed and are negative.  BP 136/72  Pulse 94  Ht 5\' 4"  (1.626 m)  Wt 201 lb (91.173 kg)  BMI 34.48 kg/m2  Physical Exam  Nursing note and vitals reviewed. Constitutional: She is oriented to person, place, and time. She appears well-developed and well-nourished.  Obese, walks with a walker  HENT:  Head: Normocephalic.  Nose: Nose normal.  Mouth/Throat: Oropharynx is clear and moist.  Eyes: Conjunctivae are normal. Pupils are equal, round, and reactive to light.  Neck: Normal range of motion. Neck supple. No JVD present.  Cardiovascular: Normal rate, regular rhythm, S1 normal, S2 normal and intact distal pulses.  Exam reveals no gallop and no friction rub.   Murmur heard.  Crescendo systolic murmur is present with a grade of 2/6  Trace lower extremity edema  Pulmonary/Chest: Effort normal and breath sounds normal. No respiratory distress. She has no wheezes. She has no rales. She exhibits no tenderness.  Abdominal: Soft. Bowel sounds are normal. She exhibits no distension. There is no tenderness.  Musculoskeletal: Normal range of motion. She exhibits no edema and no tenderness.  Lymphadenopathy:    She has no cervical adenopathy.  Neurological: She is alert and oriented to person, place, and time. Coordination normal.  Skin: Skin is warm and dry. No rash noted. No erythema.  Psychiatric: She has a normal mood and affect. Her behavior is normal.  Judgment and thought content normal.    Assessment and Plan

## 2013-11-15 NOTE — Assessment & Plan Note (Signed)
Hemoglobin A1c 7.0. Encouraged strict diet

## 2013-11-15 NOTE — Patient Instructions (Addendum)
You are doing well. No medication changes were made.  Please call us if you have new issues that need to be addressed before your next appt.  Your physician wants you to follow-up in: 6 months.  You will receive a reminder letter in the mail two months in advance. If you don't receive a letter, please call our office to schedule the follow-up appointment.   

## 2013-11-15 NOTE — Assessment & Plan Note (Signed)
Blood pressure is well controlled on today's visit. No changes made to the medications. 

## 2013-11-17 DIAGNOSIS — I951 Orthostatic hypotension: Secondary | ICD-10-CM | POA: Diagnosis not present

## 2013-11-17 DIAGNOSIS — D510 Vitamin B12 deficiency anemia due to intrinsic factor deficiency: Secondary | ICD-10-CM | POA: Diagnosis not present

## 2013-11-17 DIAGNOSIS — I4891 Unspecified atrial fibrillation: Secondary | ICD-10-CM | POA: Diagnosis not present

## 2013-11-17 DIAGNOSIS — E119 Type 2 diabetes mellitus without complications: Secondary | ICD-10-CM | POA: Diagnosis not present

## 2013-11-20 DIAGNOSIS — E119 Type 2 diabetes mellitus without complications: Secondary | ICD-10-CM | POA: Diagnosis not present

## 2013-11-20 DIAGNOSIS — D510 Vitamin B12 deficiency anemia due to intrinsic factor deficiency: Secondary | ICD-10-CM | POA: Diagnosis not present

## 2013-11-20 DIAGNOSIS — I951 Orthostatic hypotension: Secondary | ICD-10-CM | POA: Diagnosis not present

## 2013-11-20 DIAGNOSIS — I4891 Unspecified atrial fibrillation: Secondary | ICD-10-CM | POA: Diagnosis not present

## 2013-11-22 DIAGNOSIS — E119 Type 2 diabetes mellitus without complications: Secondary | ICD-10-CM | POA: Diagnosis not present

## 2013-11-22 DIAGNOSIS — I951 Orthostatic hypotension: Secondary | ICD-10-CM | POA: Diagnosis not present

## 2013-11-22 DIAGNOSIS — D510 Vitamin B12 deficiency anemia due to intrinsic factor deficiency: Secondary | ICD-10-CM | POA: Diagnosis not present

## 2013-11-22 DIAGNOSIS — I4891 Unspecified atrial fibrillation: Secondary | ICD-10-CM | POA: Diagnosis not present

## 2013-11-23 DIAGNOSIS — D510 Vitamin B12 deficiency anemia due to intrinsic factor deficiency: Secondary | ICD-10-CM | POA: Diagnosis not present

## 2013-11-23 DIAGNOSIS — E119 Type 2 diabetes mellitus without complications: Secondary | ICD-10-CM | POA: Diagnosis not present

## 2013-11-23 DIAGNOSIS — I951 Orthostatic hypotension: Secondary | ICD-10-CM | POA: Diagnosis not present

## 2013-11-23 DIAGNOSIS — I4891 Unspecified atrial fibrillation: Secondary | ICD-10-CM | POA: Diagnosis not present

## 2013-11-24 DIAGNOSIS — E119 Type 2 diabetes mellitus without complications: Secondary | ICD-10-CM | POA: Diagnosis not present

## 2013-11-24 DIAGNOSIS — D510 Vitamin B12 deficiency anemia due to intrinsic factor deficiency: Secondary | ICD-10-CM | POA: Diagnosis not present

## 2013-11-24 DIAGNOSIS — I4891 Unspecified atrial fibrillation: Secondary | ICD-10-CM | POA: Diagnosis not present

## 2013-11-24 DIAGNOSIS — I951 Orthostatic hypotension: Secondary | ICD-10-CM | POA: Diagnosis not present

## 2013-11-27 DIAGNOSIS — I951 Orthostatic hypotension: Secondary | ICD-10-CM | POA: Diagnosis not present

## 2013-11-27 DIAGNOSIS — I4891 Unspecified atrial fibrillation: Secondary | ICD-10-CM | POA: Diagnosis not present

## 2013-11-27 DIAGNOSIS — D510 Vitamin B12 deficiency anemia due to intrinsic factor deficiency: Secondary | ICD-10-CM | POA: Diagnosis not present

## 2013-11-27 DIAGNOSIS — E119 Type 2 diabetes mellitus without complications: Secondary | ICD-10-CM | POA: Diagnosis not present

## 2013-11-29 DIAGNOSIS — I4891 Unspecified atrial fibrillation: Secondary | ICD-10-CM | POA: Diagnosis not present

## 2013-11-29 DIAGNOSIS — D510 Vitamin B12 deficiency anemia due to intrinsic factor deficiency: Secondary | ICD-10-CM | POA: Diagnosis not present

## 2013-11-29 DIAGNOSIS — I951 Orthostatic hypotension: Secondary | ICD-10-CM | POA: Diagnosis not present

## 2013-11-29 DIAGNOSIS — E119 Type 2 diabetes mellitus without complications: Secondary | ICD-10-CM | POA: Diagnosis not present

## 2013-11-30 ENCOUNTER — Ambulatory Visit (INDEPENDENT_AMBULATORY_CARE_PROVIDER_SITE_OTHER): Payer: Self-pay | Admitting: General Surgery

## 2013-11-30 ENCOUNTER — Encounter: Payer: Self-pay | Admitting: General Surgery

## 2013-11-30 VITALS — BP 130/74 | HR 80 | Resp 16 | Ht 65.0 in | Wt 202.0 lb

## 2013-11-30 DIAGNOSIS — C50919 Malignant neoplasm of unspecified site of unspecified female breast: Secondary | ICD-10-CM

## 2013-11-30 DIAGNOSIS — C50912 Malignant neoplasm of unspecified site of left female breast: Secondary | ICD-10-CM

## 2013-11-30 NOTE — Patient Instructions (Signed)
Patient to return in February 2016 after having her right breast mammogram done. Continue self breast exams. Call office for any new breast issues or concerns.

## 2013-11-30 NOTE — Progress Notes (Signed)
Patient ID: Mia Padilla, female   DOB: Jan 15, 1935, 79 y.o.   MRN: [id number]  Chief Complaint  Patient presents with  . Follow-up    follow up breast cancer hx of left mastectomy    HPI Mia Padilla is a 79 y.o. female who presents for a breast cancer follow up. The patient had a left mastectomy performed on 08/03/13. No mammograms obtained at this time. No new problems/concerns at this time.   HPI  Past Medical History  Diagnosis Date  . Hypotension   . DM type 2 (diabetes mellitus, type 2)   . History of seizures   . Obesity   . Debilitated   . Hypertension   . Thyroid disease   . Hyperlipidemia   . Bowel incontinence   . Urinary incontinence   . Atrial fibrillation, new onset   . Glaucoma   . Black head   . Arthritis     hands  . Carpal tunnel syndrome     Past Surgical History  Procedure Laterality Date  . Partial hysterectomy    . Tonsillectomy    . Umbilical hernia repair    . Carpal tunnel release Right 30 years   . Bowel resection  2005  . Breast surgery  08/03/13    left mastectomy  . Mastectomy      left     Family History  Problem Relation Age of Onset  . Cervical cancer Mother   . Cervical cancer Daughter     Social History History  Substance Use Topics  . Smoking status: Never Smoker   . Smokeless tobacco: Never Used  . Alcohol Use: No    Allergies  Allergen Reactions  . Darvocet [Propoxyphene N-Acetaminophen]   . Percocet [Oxycodone-Acetaminophen]     hallucination    Current Outpatient Prescriptions  Medication Sig Dispense Refill  . acetaminophen (TYLENOL) 500 MG tablet Take 500 mg by mouth every 6 (six) hours as needed.      Marland Kitchen albuterol (PROVENTIL HFA;VENTOLIN HFA) 108 (90 BASE) MCG/ACT inhaler Inhale 2 puffs into the lungs every 6 (six) hours as needed.      Marland Kitchen alendronate (FOSAMAX) 70 MG tablet Take 70 mg by mouth once a week. Take with a full glass of water on an empty stomach.      Marland Kitchen anastrozole (ARIMIDEX) 1 MG tablet Take 1  tablet (1 mg total) by mouth daily.  30 tablet  12  . aspirin EC 81 MG tablet Take 81 mg by mouth daily.      . brimonidine (ALPHAGAN P) 0.1 % SOLN       . brinzolamide (AZOPT) 1 % ophthalmic suspension Apply 1 drop to eye 3 (three) times daily.      . Canagliflozin (INVOKANA) 100 MG TABS Take 100 mg by mouth daily.      . clopidogrel (PLAVIX) 75 MG tablet Take 75 mg by mouth daily.      . digoxin (LANOXIN) 0.125 MG tablet Take 125 mcg by mouth daily.      . dorzolamide (TRUSOPT) 2 % ophthalmic solution 1 drop 3 (three) times daily.      . fluticasone (FLONASE) 50 MCG/ACT nasal spray Place 2 sprays into the nose daily.      . Fluticasone-Salmeterol (ADVAIR) 250-50 MCG/DOSE AEPB Inhale 1 puff into the lungs every 12 (twelve) hours.      Marland Kitchen guaiFENesin (MUCINEX) 600 MG 12 hr tablet Take 1,200 mg by mouth 2 (two) times daily.      Marland Kitchen  insulin detemir (LEVEMIR) 100 UNIT/ML injection Inject 23 Units into the skin at bedtime.      Marland Kitchen levothyroxine (SYNTHROID, LEVOTHROID) 75 MCG tablet Take 75 mcg by mouth daily.      . meclizine (ANTIVERT) 25 MG tablet Take 25 mg by mouth 3 (three) times daily as needed.      . metFORMIN (GLUCOPHAGE) 500 MG tablet Take 500 mg by mouth 2 (two) times daily with a meal.      . Olopatadine HCl (PATADAY) 0.2 % SOLN Apply to eye daily.      . solifenacin (VESICARE) 5 MG tablet Take 10 mg by mouth daily.      . travoprost, benzalkonium, (TRAVATAN) 0.004 % ophthalmic solution Apply 1 drop to eye at bedtime.      Marland Kitchen lisinopril (PRINIVIL,ZESTRIL) 10 MG tablet Take 1 tablet (10 mg total) by mouth daily.  30 tablet  6  . metoprolol succinate (TOPROL-XL) 100 MG 24 hr tablet Take 100 mg by mouth daily.        No current facility-administered medications for this visit.    Review of Systems Review of Systems  Constitutional: Negative.   Respiratory: Negative.   Cardiovascular: Negative.     Blood pressure 130/74, pulse 80, resp. rate 16, height 5\' 5"  (1.651 m), weight 202 lb  (91.627 kg).  Physical Exam Physical Exam  Constitutional: She is oriented to person, place, and time. She appears well-developed and well-nourished.  Pulmonary/Chest:  Left mastectomy site well healed, clean, and no signs of reoccurrence, seroma.   Lymphadenopathy:    She has no cervical adenopathy.    She has no axillary adenopathy.  Neurological: She is alert and oriented to person, place, and time.  Skin: Skin is warm and dry.    Data Reviewed    Assessment    Doing well after left total mastectomy, now on Arimedex .      Plan    Follow up in February next yr with right diagnostic mammogram        Kelda Azad G 11/30/2013, 6:34 PM

## 2013-12-01 DIAGNOSIS — I951 Orthostatic hypotension: Secondary | ICD-10-CM | POA: Diagnosis not present

## 2013-12-01 DIAGNOSIS — I4891 Unspecified atrial fibrillation: Secondary | ICD-10-CM | POA: Diagnosis not present

## 2013-12-01 DIAGNOSIS — D510 Vitamin B12 deficiency anemia due to intrinsic factor deficiency: Secondary | ICD-10-CM | POA: Diagnosis not present

## 2013-12-01 DIAGNOSIS — E119 Type 2 diabetes mellitus without complications: Secondary | ICD-10-CM | POA: Diagnosis not present

## 2013-12-04 ENCOUNTER — Ambulatory Visit: Payer: Self-pay | Admitting: General Surgery

## 2013-12-04 DIAGNOSIS — D510 Vitamin B12 deficiency anemia due to intrinsic factor deficiency: Secondary | ICD-10-CM | POA: Diagnosis not present

## 2013-12-04 DIAGNOSIS — I951 Orthostatic hypotension: Secondary | ICD-10-CM | POA: Diagnosis not present

## 2013-12-04 DIAGNOSIS — I4891 Unspecified atrial fibrillation: Secondary | ICD-10-CM | POA: Diagnosis not present

## 2013-12-04 DIAGNOSIS — E119 Type 2 diabetes mellitus without complications: Secondary | ICD-10-CM | POA: Diagnosis not present

## 2013-12-05 ENCOUNTER — Ambulatory Visit: Payer: Self-pay

## 2013-12-06 DIAGNOSIS — I4891 Unspecified atrial fibrillation: Secondary | ICD-10-CM | POA: Diagnosis not present

## 2013-12-06 DIAGNOSIS — I951 Orthostatic hypotension: Secondary | ICD-10-CM | POA: Diagnosis not present

## 2013-12-06 DIAGNOSIS — E119 Type 2 diabetes mellitus without complications: Secondary | ICD-10-CM | POA: Diagnosis not present

## 2013-12-06 DIAGNOSIS — D510 Vitamin B12 deficiency anemia due to intrinsic factor deficiency: Secondary | ICD-10-CM | POA: Diagnosis not present

## 2013-12-08 DIAGNOSIS — I951 Orthostatic hypotension: Secondary | ICD-10-CM | POA: Diagnosis not present

## 2013-12-08 DIAGNOSIS — I4891 Unspecified atrial fibrillation: Secondary | ICD-10-CM | POA: Diagnosis not present

## 2013-12-08 DIAGNOSIS — D510 Vitamin B12 deficiency anemia due to intrinsic factor deficiency: Secondary | ICD-10-CM | POA: Diagnosis not present

## 2013-12-08 DIAGNOSIS — E119 Type 2 diabetes mellitus without complications: Secondary | ICD-10-CM | POA: Diagnosis not present

## 2013-12-11 DIAGNOSIS — D510 Vitamin B12 deficiency anemia due to intrinsic factor deficiency: Secondary | ICD-10-CM | POA: Diagnosis not present

## 2013-12-11 DIAGNOSIS — I4891 Unspecified atrial fibrillation: Secondary | ICD-10-CM | POA: Diagnosis not present

## 2013-12-11 DIAGNOSIS — E119 Type 2 diabetes mellitus without complications: Secondary | ICD-10-CM | POA: Diagnosis not present

## 2013-12-11 DIAGNOSIS — I951 Orthostatic hypotension: Secondary | ICD-10-CM | POA: Diagnosis not present

## 2013-12-13 DIAGNOSIS — I4891 Unspecified atrial fibrillation: Secondary | ICD-10-CM | POA: Diagnosis not present

## 2013-12-13 DIAGNOSIS — D510 Vitamin B12 deficiency anemia due to intrinsic factor deficiency: Secondary | ICD-10-CM | POA: Diagnosis not present

## 2013-12-13 DIAGNOSIS — E119 Type 2 diabetes mellitus without complications: Secondary | ICD-10-CM | POA: Diagnosis not present

## 2013-12-13 DIAGNOSIS — I951 Orthostatic hypotension: Secondary | ICD-10-CM | POA: Diagnosis not present

## 2013-12-15 DIAGNOSIS — I951 Orthostatic hypotension: Secondary | ICD-10-CM | POA: Diagnosis not present

## 2013-12-15 DIAGNOSIS — D510 Vitamin B12 deficiency anemia due to intrinsic factor deficiency: Secondary | ICD-10-CM | POA: Diagnosis not present

## 2013-12-15 DIAGNOSIS — I4891 Unspecified atrial fibrillation: Secondary | ICD-10-CM | POA: Diagnosis not present

## 2013-12-15 DIAGNOSIS — E119 Type 2 diabetes mellitus without complications: Secondary | ICD-10-CM | POA: Diagnosis not present

## 2013-12-18 DIAGNOSIS — I4891 Unspecified atrial fibrillation: Secondary | ICD-10-CM | POA: Diagnosis not present

## 2013-12-18 DIAGNOSIS — D510 Vitamin B12 deficiency anemia due to intrinsic factor deficiency: Secondary | ICD-10-CM | POA: Diagnosis not present

## 2013-12-18 DIAGNOSIS — I951 Orthostatic hypotension: Secondary | ICD-10-CM | POA: Diagnosis not present

## 2013-12-18 DIAGNOSIS — E119 Type 2 diabetes mellitus without complications: Secondary | ICD-10-CM | POA: Diagnosis not present

## 2013-12-20 DIAGNOSIS — I4891 Unspecified atrial fibrillation: Secondary | ICD-10-CM | POA: Diagnosis not present

## 2013-12-20 DIAGNOSIS — E119 Type 2 diabetes mellitus without complications: Secondary | ICD-10-CM | POA: Diagnosis not present

## 2013-12-20 DIAGNOSIS — I951 Orthostatic hypotension: Secondary | ICD-10-CM | POA: Diagnosis not present

## 2013-12-20 DIAGNOSIS — D510 Vitamin B12 deficiency anemia due to intrinsic factor deficiency: Secondary | ICD-10-CM | POA: Diagnosis not present

## 2013-12-22 DIAGNOSIS — D510 Vitamin B12 deficiency anemia due to intrinsic factor deficiency: Secondary | ICD-10-CM | POA: Diagnosis not present

## 2013-12-22 DIAGNOSIS — E119 Type 2 diabetes mellitus without complications: Secondary | ICD-10-CM | POA: Diagnosis not present

## 2013-12-22 DIAGNOSIS — I4891 Unspecified atrial fibrillation: Secondary | ICD-10-CM | POA: Diagnosis not present

## 2013-12-22 DIAGNOSIS — I951 Orthostatic hypotension: Secondary | ICD-10-CM | POA: Diagnosis not present

## 2013-12-25 DIAGNOSIS — I4891 Unspecified atrial fibrillation: Secondary | ICD-10-CM | POA: Diagnosis not present

## 2013-12-25 DIAGNOSIS — D510 Vitamin B12 deficiency anemia due to intrinsic factor deficiency: Secondary | ICD-10-CM | POA: Diagnosis not present

## 2013-12-25 DIAGNOSIS — E119 Type 2 diabetes mellitus without complications: Secondary | ICD-10-CM | POA: Diagnosis not present

## 2013-12-25 DIAGNOSIS — I951 Orthostatic hypotension: Secondary | ICD-10-CM | POA: Diagnosis not present

## 2013-12-27 DIAGNOSIS — E119 Type 2 diabetes mellitus without complications: Secondary | ICD-10-CM | POA: Diagnosis not present

## 2013-12-27 DIAGNOSIS — D510 Vitamin B12 deficiency anemia due to intrinsic factor deficiency: Secondary | ICD-10-CM | POA: Diagnosis not present

## 2013-12-27 DIAGNOSIS — I4891 Unspecified atrial fibrillation: Secondary | ICD-10-CM | POA: Diagnosis not present

## 2013-12-27 DIAGNOSIS — I951 Orthostatic hypotension: Secondary | ICD-10-CM | POA: Diagnosis not present

## 2013-12-29 DIAGNOSIS — I4891 Unspecified atrial fibrillation: Secondary | ICD-10-CM | POA: Diagnosis not present

## 2013-12-29 DIAGNOSIS — E119 Type 2 diabetes mellitus without complications: Secondary | ICD-10-CM | POA: Diagnosis not present

## 2013-12-29 DIAGNOSIS — D510 Vitamin B12 deficiency anemia due to intrinsic factor deficiency: Secondary | ICD-10-CM | POA: Diagnosis not present

## 2013-12-29 DIAGNOSIS — I951 Orthostatic hypotension: Secondary | ICD-10-CM | POA: Diagnosis not present

## 2014-01-01 DIAGNOSIS — I4891 Unspecified atrial fibrillation: Secondary | ICD-10-CM | POA: Diagnosis not present

## 2014-01-01 DIAGNOSIS — D510 Vitamin B12 deficiency anemia due to intrinsic factor deficiency: Secondary | ICD-10-CM | POA: Diagnosis not present

## 2014-01-01 DIAGNOSIS — I951 Orthostatic hypotension: Secondary | ICD-10-CM | POA: Diagnosis not present

## 2014-01-01 DIAGNOSIS — E119 Type 2 diabetes mellitus without complications: Secondary | ICD-10-CM | POA: Diagnosis not present

## 2014-01-02 DIAGNOSIS — E119 Type 2 diabetes mellitus without complications: Secondary | ICD-10-CM | POA: Diagnosis not present

## 2014-01-02 DIAGNOSIS — D510 Vitamin B12 deficiency anemia due to intrinsic factor deficiency: Secondary | ICD-10-CM | POA: Diagnosis not present

## 2014-01-02 DIAGNOSIS — I4891 Unspecified atrial fibrillation: Secondary | ICD-10-CM | POA: Diagnosis not present

## 2014-01-02 DIAGNOSIS — I951 Orthostatic hypotension: Secondary | ICD-10-CM | POA: Diagnosis not present

## 2014-01-03 DIAGNOSIS — B351 Tinea unguium: Secondary | ICD-10-CM | POA: Diagnosis not present

## 2014-01-03 DIAGNOSIS — E119 Type 2 diabetes mellitus without complications: Secondary | ICD-10-CM | POA: Diagnosis not present

## 2014-01-04 DIAGNOSIS — E119 Type 2 diabetes mellitus without complications: Secondary | ICD-10-CM | POA: Diagnosis not present

## 2014-01-04 DIAGNOSIS — D510 Vitamin B12 deficiency anemia due to intrinsic factor deficiency: Secondary | ICD-10-CM | POA: Diagnosis not present

## 2014-01-04 DIAGNOSIS — I4891 Unspecified atrial fibrillation: Secondary | ICD-10-CM | POA: Diagnosis not present

## 2014-01-04 DIAGNOSIS — I951 Orthostatic hypotension: Secondary | ICD-10-CM | POA: Diagnosis not present

## 2014-01-05 DIAGNOSIS — I4891 Unspecified atrial fibrillation: Secondary | ICD-10-CM | POA: Diagnosis not present

## 2014-01-05 DIAGNOSIS — E119 Type 2 diabetes mellitus without complications: Secondary | ICD-10-CM | POA: Diagnosis not present

## 2014-01-05 DIAGNOSIS — I951 Orthostatic hypotension: Secondary | ICD-10-CM | POA: Diagnosis not present

## 2014-01-05 DIAGNOSIS — D510 Vitamin B12 deficiency anemia due to intrinsic factor deficiency: Secondary | ICD-10-CM | POA: Diagnosis not present

## 2014-01-08 DIAGNOSIS — E119 Type 2 diabetes mellitus without complications: Secondary | ICD-10-CM | POA: Diagnosis not present

## 2014-01-08 DIAGNOSIS — I4891 Unspecified atrial fibrillation: Secondary | ICD-10-CM | POA: Diagnosis not present

## 2014-01-08 DIAGNOSIS — I951 Orthostatic hypotension: Secondary | ICD-10-CM | POA: Diagnosis not present

## 2014-01-08 DIAGNOSIS — D510 Vitamin B12 deficiency anemia due to intrinsic factor deficiency: Secondary | ICD-10-CM | POA: Diagnosis not present

## 2014-01-10 DIAGNOSIS — I4891 Unspecified atrial fibrillation: Secondary | ICD-10-CM | POA: Diagnosis not present

## 2014-01-10 DIAGNOSIS — I951 Orthostatic hypotension: Secondary | ICD-10-CM | POA: Diagnosis not present

## 2014-01-10 DIAGNOSIS — D510 Vitamin B12 deficiency anemia due to intrinsic factor deficiency: Secondary | ICD-10-CM | POA: Diagnosis not present

## 2014-01-10 DIAGNOSIS — E119 Type 2 diabetes mellitus without complications: Secondary | ICD-10-CM | POA: Diagnosis not present

## 2014-01-11 DIAGNOSIS — C50919 Malignant neoplasm of unspecified site of unspecified female breast: Secondary | ICD-10-CM | POA: Diagnosis not present

## 2014-01-11 DIAGNOSIS — G40309 Generalized idiopathic epilepsy and epileptic syndromes, not intractable, without status epilepticus: Secondary | ICD-10-CM | POA: Diagnosis not present

## 2014-01-11 DIAGNOSIS — I739 Peripheral vascular disease, unspecified: Secondary | ICD-10-CM | POA: Diagnosis not present

## 2014-01-11 DIAGNOSIS — I4891 Unspecified atrial fibrillation: Secondary | ICD-10-CM | POA: Diagnosis not present

## 2014-01-11 DIAGNOSIS — E039 Hypothyroidism, unspecified: Secondary | ICD-10-CM | POA: Diagnosis not present

## 2014-01-11 DIAGNOSIS — E119 Type 2 diabetes mellitus without complications: Secondary | ICD-10-CM | POA: Diagnosis not present

## 2014-01-15 DIAGNOSIS — I4891 Unspecified atrial fibrillation: Secondary | ICD-10-CM | POA: Diagnosis not present

## 2014-01-15 DIAGNOSIS — I951 Orthostatic hypotension: Secondary | ICD-10-CM | POA: Diagnosis not present

## 2014-01-15 DIAGNOSIS — E119 Type 2 diabetes mellitus without complications: Secondary | ICD-10-CM | POA: Diagnosis not present

## 2014-01-15 DIAGNOSIS — D510 Vitamin B12 deficiency anemia due to intrinsic factor deficiency: Secondary | ICD-10-CM | POA: Diagnosis not present

## 2014-01-16 DIAGNOSIS — E119 Type 2 diabetes mellitus without complications: Secondary | ICD-10-CM | POA: Diagnosis not present

## 2014-01-16 DIAGNOSIS — I951 Orthostatic hypotension: Secondary | ICD-10-CM | POA: Diagnosis not present

## 2014-01-16 DIAGNOSIS — D510 Vitamin B12 deficiency anemia due to intrinsic factor deficiency: Secondary | ICD-10-CM | POA: Diagnosis not present

## 2014-01-16 DIAGNOSIS — I4891 Unspecified atrial fibrillation: Secondary | ICD-10-CM | POA: Diagnosis not present

## 2014-01-17 DIAGNOSIS — E119 Type 2 diabetes mellitus without complications: Secondary | ICD-10-CM | POA: Diagnosis not present

## 2014-01-17 DIAGNOSIS — D510 Vitamin B12 deficiency anemia due to intrinsic factor deficiency: Secondary | ICD-10-CM | POA: Diagnosis not present

## 2014-01-17 DIAGNOSIS — I4891 Unspecified atrial fibrillation: Secondary | ICD-10-CM | POA: Diagnosis not present

## 2014-01-17 DIAGNOSIS — I951 Orthostatic hypotension: Secondary | ICD-10-CM | POA: Diagnosis not present

## 2014-01-19 DIAGNOSIS — I4891 Unspecified atrial fibrillation: Secondary | ICD-10-CM | POA: Diagnosis not present

## 2014-01-19 DIAGNOSIS — D510 Vitamin B12 deficiency anemia due to intrinsic factor deficiency: Secondary | ICD-10-CM | POA: Diagnosis not present

## 2014-01-19 DIAGNOSIS — E119 Type 2 diabetes mellitus without complications: Secondary | ICD-10-CM | POA: Diagnosis not present

## 2014-01-19 DIAGNOSIS — I951 Orthostatic hypotension: Secondary | ICD-10-CM | POA: Diagnosis not present

## 2014-01-22 DIAGNOSIS — I4891 Unspecified atrial fibrillation: Secondary | ICD-10-CM | POA: Diagnosis not present

## 2014-01-22 DIAGNOSIS — E119 Type 2 diabetes mellitus without complications: Secondary | ICD-10-CM | POA: Diagnosis not present

## 2014-01-22 DIAGNOSIS — I951 Orthostatic hypotension: Secondary | ICD-10-CM | POA: Diagnosis not present

## 2014-01-22 DIAGNOSIS — D510 Vitamin B12 deficiency anemia due to intrinsic factor deficiency: Secondary | ICD-10-CM | POA: Diagnosis not present

## 2014-01-23 DIAGNOSIS — Z23 Encounter for immunization: Secondary | ICD-10-CM | POA: Diagnosis not present

## 2014-01-24 DIAGNOSIS — I951 Orthostatic hypotension: Secondary | ICD-10-CM | POA: Diagnosis not present

## 2014-01-24 DIAGNOSIS — I4891 Unspecified atrial fibrillation: Secondary | ICD-10-CM | POA: Diagnosis not present

## 2014-01-24 DIAGNOSIS — D510 Vitamin B12 deficiency anemia due to intrinsic factor deficiency: Secondary | ICD-10-CM | POA: Diagnosis not present

## 2014-01-24 DIAGNOSIS — E119 Type 2 diabetes mellitus without complications: Secondary | ICD-10-CM | POA: Diagnosis not present

## 2014-01-25 DIAGNOSIS — D510 Vitamin B12 deficiency anemia due to intrinsic factor deficiency: Secondary | ICD-10-CM | POA: Diagnosis not present

## 2014-01-25 DIAGNOSIS — I4891 Unspecified atrial fibrillation: Secondary | ICD-10-CM | POA: Diagnosis not present

## 2014-01-25 DIAGNOSIS — E119 Type 2 diabetes mellitus without complications: Secondary | ICD-10-CM | POA: Diagnosis not present

## 2014-01-25 DIAGNOSIS — I951 Orthostatic hypotension: Secondary | ICD-10-CM | POA: Diagnosis not present

## 2014-01-29 DIAGNOSIS — I4891 Unspecified atrial fibrillation: Secondary | ICD-10-CM | POA: Diagnosis not present

## 2014-01-29 DIAGNOSIS — I951 Orthostatic hypotension: Secondary | ICD-10-CM | POA: Diagnosis not present

## 2014-01-29 DIAGNOSIS — D510 Vitamin B12 deficiency anemia due to intrinsic factor deficiency: Secondary | ICD-10-CM | POA: Diagnosis not present

## 2014-01-29 DIAGNOSIS — E119 Type 2 diabetes mellitus without complications: Secondary | ICD-10-CM | POA: Diagnosis not present

## 2014-01-31 DIAGNOSIS — D510 Vitamin B12 deficiency anemia due to intrinsic factor deficiency: Secondary | ICD-10-CM | POA: Diagnosis not present

## 2014-01-31 DIAGNOSIS — E119 Type 2 diabetes mellitus without complications: Secondary | ICD-10-CM | POA: Diagnosis not present

## 2014-01-31 DIAGNOSIS — I4891 Unspecified atrial fibrillation: Secondary | ICD-10-CM | POA: Diagnosis not present

## 2014-01-31 DIAGNOSIS — I951 Orthostatic hypotension: Secondary | ICD-10-CM | POA: Diagnosis not present

## 2014-02-02 DIAGNOSIS — I951 Orthostatic hypotension: Secondary | ICD-10-CM | POA: Diagnosis not present

## 2014-02-02 DIAGNOSIS — I4891 Unspecified atrial fibrillation: Secondary | ICD-10-CM | POA: Diagnosis not present

## 2014-02-02 DIAGNOSIS — D510 Vitamin B12 deficiency anemia due to intrinsic factor deficiency: Secondary | ICD-10-CM | POA: Diagnosis not present

## 2014-02-02 DIAGNOSIS — E119 Type 2 diabetes mellitus without complications: Secondary | ICD-10-CM | POA: Diagnosis not present

## 2014-02-05 DIAGNOSIS — D510 Vitamin B12 deficiency anemia due to intrinsic factor deficiency: Secondary | ICD-10-CM | POA: Diagnosis not present

## 2014-02-05 DIAGNOSIS — E119 Type 2 diabetes mellitus without complications: Secondary | ICD-10-CM | POA: Diagnosis not present

## 2014-02-05 DIAGNOSIS — I951 Orthostatic hypotension: Secondary | ICD-10-CM | POA: Diagnosis not present

## 2014-02-05 DIAGNOSIS — I4891 Unspecified atrial fibrillation: Secondary | ICD-10-CM | POA: Diagnosis not present

## 2014-02-07 DIAGNOSIS — E119 Type 2 diabetes mellitus without complications: Secondary | ICD-10-CM | POA: Diagnosis not present

## 2014-02-07 DIAGNOSIS — I951 Orthostatic hypotension: Secondary | ICD-10-CM | POA: Diagnosis not present

## 2014-02-07 DIAGNOSIS — I4891 Unspecified atrial fibrillation: Secondary | ICD-10-CM | POA: Diagnosis not present

## 2014-02-07 DIAGNOSIS — D510 Vitamin B12 deficiency anemia due to intrinsic factor deficiency: Secondary | ICD-10-CM | POA: Diagnosis not present

## 2014-02-09 DIAGNOSIS — I4891 Unspecified atrial fibrillation: Secondary | ICD-10-CM | POA: Diagnosis not present

## 2014-02-09 DIAGNOSIS — I951 Orthostatic hypotension: Secondary | ICD-10-CM | POA: Diagnosis not present

## 2014-02-09 DIAGNOSIS — D510 Vitamin B12 deficiency anemia due to intrinsic factor deficiency: Secondary | ICD-10-CM | POA: Diagnosis not present

## 2014-02-09 DIAGNOSIS — E119 Type 2 diabetes mellitus without complications: Secondary | ICD-10-CM | POA: Diagnosis not present

## 2014-02-12 DIAGNOSIS — D510 Vitamin B12 deficiency anemia due to intrinsic factor deficiency: Secondary | ICD-10-CM | POA: Diagnosis not present

## 2014-02-12 DIAGNOSIS — I951 Orthostatic hypotension: Secondary | ICD-10-CM | POA: Diagnosis not present

## 2014-02-12 DIAGNOSIS — E119 Type 2 diabetes mellitus without complications: Secondary | ICD-10-CM | POA: Diagnosis not present

## 2014-02-12 DIAGNOSIS — I4891 Unspecified atrial fibrillation: Secondary | ICD-10-CM | POA: Diagnosis not present

## 2014-02-14 DIAGNOSIS — I951 Orthostatic hypotension: Secondary | ICD-10-CM | POA: Diagnosis not present

## 2014-02-14 DIAGNOSIS — D510 Vitamin B12 deficiency anemia due to intrinsic factor deficiency: Secondary | ICD-10-CM | POA: Diagnosis not present

## 2014-02-14 DIAGNOSIS — I4891 Unspecified atrial fibrillation: Secondary | ICD-10-CM | POA: Diagnosis not present

## 2014-02-14 DIAGNOSIS — E119 Type 2 diabetes mellitus without complications: Secondary | ICD-10-CM | POA: Diagnosis not present

## 2014-02-16 DIAGNOSIS — I951 Orthostatic hypotension: Secondary | ICD-10-CM | POA: Diagnosis not present

## 2014-02-16 DIAGNOSIS — D510 Vitamin B12 deficiency anemia due to intrinsic factor deficiency: Secondary | ICD-10-CM | POA: Diagnosis not present

## 2014-02-16 DIAGNOSIS — E119 Type 2 diabetes mellitus without complications: Secondary | ICD-10-CM | POA: Diagnosis not present

## 2014-02-16 DIAGNOSIS — I4891 Unspecified atrial fibrillation: Secondary | ICD-10-CM | POA: Diagnosis not present

## 2014-02-19 ENCOUNTER — Encounter: Payer: Self-pay | Admitting: General Surgery

## 2014-02-19 DIAGNOSIS — D510 Vitamin B12 deficiency anemia due to intrinsic factor deficiency: Secondary | ICD-10-CM | POA: Diagnosis not present

## 2014-02-19 DIAGNOSIS — E119 Type 2 diabetes mellitus without complications: Secondary | ICD-10-CM | POA: Diagnosis not present

## 2014-02-19 DIAGNOSIS — I4891 Unspecified atrial fibrillation: Secondary | ICD-10-CM | POA: Diagnosis not present

## 2014-02-19 DIAGNOSIS — I951 Orthostatic hypotension: Secondary | ICD-10-CM | POA: Diagnosis not present

## 2014-02-21 DIAGNOSIS — D510 Vitamin B12 deficiency anemia due to intrinsic factor deficiency: Secondary | ICD-10-CM | POA: Diagnosis not present

## 2014-02-21 DIAGNOSIS — I951 Orthostatic hypotension: Secondary | ICD-10-CM | POA: Diagnosis not present

## 2014-02-21 DIAGNOSIS — E119 Type 2 diabetes mellitus without complications: Secondary | ICD-10-CM | POA: Diagnosis not present

## 2014-02-21 DIAGNOSIS — I4891 Unspecified atrial fibrillation: Secondary | ICD-10-CM | POA: Diagnosis not present

## 2014-02-22 DIAGNOSIS — L02419 Cutaneous abscess of limb, unspecified: Secondary | ICD-10-CM | POA: Diagnosis not present

## 2014-02-23 DIAGNOSIS — I4891 Unspecified atrial fibrillation: Secondary | ICD-10-CM | POA: Diagnosis not present

## 2014-02-23 DIAGNOSIS — D510 Vitamin B12 deficiency anemia due to intrinsic factor deficiency: Secondary | ICD-10-CM | POA: Diagnosis not present

## 2014-02-23 DIAGNOSIS — I951 Orthostatic hypotension: Secondary | ICD-10-CM | POA: Diagnosis not present

## 2014-02-23 DIAGNOSIS — E119 Type 2 diabetes mellitus without complications: Secondary | ICD-10-CM | POA: Diagnosis not present

## 2014-02-26 DIAGNOSIS — E119 Type 2 diabetes mellitus without complications: Secondary | ICD-10-CM | POA: Diagnosis not present

## 2014-02-26 DIAGNOSIS — I951 Orthostatic hypotension: Secondary | ICD-10-CM | POA: Diagnosis not present

## 2014-02-26 DIAGNOSIS — D510 Vitamin B12 deficiency anemia due to intrinsic factor deficiency: Secondary | ICD-10-CM | POA: Diagnosis not present

## 2014-02-26 DIAGNOSIS — I4891 Unspecified atrial fibrillation: Secondary | ICD-10-CM | POA: Diagnosis not present

## 2014-02-28 DIAGNOSIS — I4891 Unspecified atrial fibrillation: Secondary | ICD-10-CM | POA: Diagnosis not present

## 2014-02-28 DIAGNOSIS — D510 Vitamin B12 deficiency anemia due to intrinsic factor deficiency: Secondary | ICD-10-CM | POA: Diagnosis not present

## 2014-02-28 DIAGNOSIS — I951 Orthostatic hypotension: Secondary | ICD-10-CM | POA: Diagnosis not present

## 2014-02-28 DIAGNOSIS — E119 Type 2 diabetes mellitus without complications: Secondary | ICD-10-CM | POA: Diagnosis not present

## 2014-03-02 DIAGNOSIS — I4891 Unspecified atrial fibrillation: Secondary | ICD-10-CM | POA: Diagnosis not present

## 2014-03-02 DIAGNOSIS — D510 Vitamin B12 deficiency anemia due to intrinsic factor deficiency: Secondary | ICD-10-CM | POA: Diagnosis not present

## 2014-03-02 DIAGNOSIS — E119 Type 2 diabetes mellitus without complications: Secondary | ICD-10-CM | POA: Diagnosis not present

## 2014-03-02 DIAGNOSIS — I951 Orthostatic hypotension: Secondary | ICD-10-CM | POA: Diagnosis not present

## 2014-03-05 DIAGNOSIS — I951 Orthostatic hypotension: Secondary | ICD-10-CM | POA: Diagnosis not present

## 2014-03-05 DIAGNOSIS — E119 Type 2 diabetes mellitus without complications: Secondary | ICD-10-CM | POA: Diagnosis not present

## 2014-03-05 DIAGNOSIS — I4891 Unspecified atrial fibrillation: Secondary | ICD-10-CM | POA: Diagnosis not present

## 2014-03-05 DIAGNOSIS — D510 Vitamin B12 deficiency anemia due to intrinsic factor deficiency: Secondary | ICD-10-CM | POA: Diagnosis not present

## 2014-03-06 DIAGNOSIS — H2513 Age-related nuclear cataract, bilateral: Secondary | ICD-10-CM | POA: Diagnosis not present

## 2014-03-06 DIAGNOSIS — H4011X3 Primary open-angle glaucoma, severe stage: Secondary | ICD-10-CM | POA: Diagnosis not present

## 2014-03-07 DIAGNOSIS — E119 Type 2 diabetes mellitus without complications: Secondary | ICD-10-CM | POA: Diagnosis not present

## 2014-03-07 DIAGNOSIS — I4891 Unspecified atrial fibrillation: Secondary | ICD-10-CM | POA: Diagnosis not present

## 2014-03-07 DIAGNOSIS — D510 Vitamin B12 deficiency anemia due to intrinsic factor deficiency: Secondary | ICD-10-CM | POA: Diagnosis not present

## 2014-03-07 DIAGNOSIS — I951 Orthostatic hypotension: Secondary | ICD-10-CM | POA: Diagnosis not present

## 2014-03-09 DIAGNOSIS — D510 Vitamin B12 deficiency anemia due to intrinsic factor deficiency: Secondary | ICD-10-CM | POA: Diagnosis not present

## 2014-03-09 DIAGNOSIS — I951 Orthostatic hypotension: Secondary | ICD-10-CM | POA: Diagnosis not present

## 2014-03-09 DIAGNOSIS — E119 Type 2 diabetes mellitus without complications: Secondary | ICD-10-CM | POA: Diagnosis not present

## 2014-03-09 DIAGNOSIS — I4891 Unspecified atrial fibrillation: Secondary | ICD-10-CM | POA: Diagnosis not present

## 2014-03-12 DIAGNOSIS — I4891 Unspecified atrial fibrillation: Secondary | ICD-10-CM | POA: Diagnosis not present

## 2014-03-12 DIAGNOSIS — I951 Orthostatic hypotension: Secondary | ICD-10-CM | POA: Diagnosis not present

## 2014-03-12 DIAGNOSIS — E119 Type 2 diabetes mellitus without complications: Secondary | ICD-10-CM | POA: Diagnosis not present

## 2014-03-12 DIAGNOSIS — D510 Vitamin B12 deficiency anemia due to intrinsic factor deficiency: Secondary | ICD-10-CM | POA: Diagnosis not present

## 2014-03-14 DIAGNOSIS — D510 Vitamin B12 deficiency anemia due to intrinsic factor deficiency: Secondary | ICD-10-CM | POA: Diagnosis not present

## 2014-03-14 DIAGNOSIS — I951 Orthostatic hypotension: Secondary | ICD-10-CM | POA: Diagnosis not present

## 2014-03-14 DIAGNOSIS — E119 Type 2 diabetes mellitus without complications: Secondary | ICD-10-CM | POA: Diagnosis not present

## 2014-03-14 DIAGNOSIS — I4891 Unspecified atrial fibrillation: Secondary | ICD-10-CM | POA: Diagnosis not present

## 2014-03-16 DIAGNOSIS — E119 Type 2 diabetes mellitus without complications: Secondary | ICD-10-CM | POA: Diagnosis not present

## 2014-03-16 DIAGNOSIS — I4891 Unspecified atrial fibrillation: Secondary | ICD-10-CM | POA: Diagnosis not present

## 2014-03-16 DIAGNOSIS — I951 Orthostatic hypotension: Secondary | ICD-10-CM | POA: Diagnosis not present

## 2014-03-16 DIAGNOSIS — D510 Vitamin B12 deficiency anemia due to intrinsic factor deficiency: Secondary | ICD-10-CM | POA: Diagnosis not present

## 2014-03-17 DIAGNOSIS — I4891 Unspecified atrial fibrillation: Secondary | ICD-10-CM | POA: Diagnosis not present

## 2014-03-17 DIAGNOSIS — I951 Orthostatic hypotension: Secondary | ICD-10-CM | POA: Diagnosis not present

## 2014-03-17 DIAGNOSIS — E119 Type 2 diabetes mellitus without complications: Secondary | ICD-10-CM | POA: Diagnosis not present

## 2014-03-17 DIAGNOSIS — D510 Vitamin B12 deficiency anemia due to intrinsic factor deficiency: Secondary | ICD-10-CM | POA: Diagnosis not present

## 2014-03-21 DIAGNOSIS — D510 Vitamin B12 deficiency anemia due to intrinsic factor deficiency: Secondary | ICD-10-CM | POA: Diagnosis not present

## 2014-03-21 DIAGNOSIS — I951 Orthostatic hypotension: Secondary | ICD-10-CM | POA: Diagnosis not present

## 2014-03-21 DIAGNOSIS — I4891 Unspecified atrial fibrillation: Secondary | ICD-10-CM | POA: Diagnosis not present

## 2014-03-21 DIAGNOSIS — E119 Type 2 diabetes mellitus without complications: Secondary | ICD-10-CM | POA: Diagnosis not present

## 2014-03-23 DIAGNOSIS — D510 Vitamin B12 deficiency anemia due to intrinsic factor deficiency: Secondary | ICD-10-CM | POA: Diagnosis not present

## 2014-03-23 DIAGNOSIS — I951 Orthostatic hypotension: Secondary | ICD-10-CM | POA: Diagnosis not present

## 2014-03-23 DIAGNOSIS — I4891 Unspecified atrial fibrillation: Secondary | ICD-10-CM | POA: Diagnosis not present

## 2014-03-23 DIAGNOSIS — E119 Type 2 diabetes mellitus without complications: Secondary | ICD-10-CM | POA: Diagnosis not present

## 2014-03-26 DIAGNOSIS — E119 Type 2 diabetes mellitus without complications: Secondary | ICD-10-CM | POA: Diagnosis not present

## 2014-03-26 DIAGNOSIS — D510 Vitamin B12 deficiency anemia due to intrinsic factor deficiency: Secondary | ICD-10-CM | POA: Diagnosis not present

## 2014-03-26 DIAGNOSIS — I951 Orthostatic hypotension: Secondary | ICD-10-CM | POA: Diagnosis not present

## 2014-03-26 DIAGNOSIS — I4891 Unspecified atrial fibrillation: Secondary | ICD-10-CM | POA: Diagnosis not present

## 2014-03-28 DIAGNOSIS — I951 Orthostatic hypotension: Secondary | ICD-10-CM | POA: Diagnosis not present

## 2014-03-28 DIAGNOSIS — E119 Type 2 diabetes mellitus without complications: Secondary | ICD-10-CM | POA: Diagnosis not present

## 2014-03-28 DIAGNOSIS — I4891 Unspecified atrial fibrillation: Secondary | ICD-10-CM | POA: Diagnosis not present

## 2014-03-28 DIAGNOSIS — D510 Vitamin B12 deficiency anemia due to intrinsic factor deficiency: Secondary | ICD-10-CM | POA: Diagnosis not present

## 2014-03-30 DIAGNOSIS — E119 Type 2 diabetes mellitus without complications: Secondary | ICD-10-CM | POA: Diagnosis not present

## 2014-03-30 DIAGNOSIS — D510 Vitamin B12 deficiency anemia due to intrinsic factor deficiency: Secondary | ICD-10-CM | POA: Diagnosis not present

## 2014-03-30 DIAGNOSIS — I4891 Unspecified atrial fibrillation: Secondary | ICD-10-CM | POA: Diagnosis not present

## 2014-03-30 DIAGNOSIS — I951 Orthostatic hypotension: Secondary | ICD-10-CM | POA: Diagnosis not present

## 2014-04-02 DIAGNOSIS — E119 Type 2 diabetes mellitus without complications: Secondary | ICD-10-CM | POA: Diagnosis not present

## 2014-04-02 DIAGNOSIS — I951 Orthostatic hypotension: Secondary | ICD-10-CM | POA: Diagnosis not present

## 2014-04-02 DIAGNOSIS — I4891 Unspecified atrial fibrillation: Secondary | ICD-10-CM | POA: Diagnosis not present

## 2014-04-02 DIAGNOSIS — D510 Vitamin B12 deficiency anemia due to intrinsic factor deficiency: Secondary | ICD-10-CM | POA: Diagnosis not present

## 2014-04-04 DIAGNOSIS — E119 Type 2 diabetes mellitus without complications: Secondary | ICD-10-CM | POA: Diagnosis not present

## 2014-04-04 DIAGNOSIS — I4891 Unspecified atrial fibrillation: Secondary | ICD-10-CM | POA: Diagnosis not present

## 2014-04-04 DIAGNOSIS — D510 Vitamin B12 deficiency anemia due to intrinsic factor deficiency: Secondary | ICD-10-CM | POA: Diagnosis not present

## 2014-04-04 DIAGNOSIS — I951 Orthostatic hypotension: Secondary | ICD-10-CM | POA: Diagnosis not present

## 2014-04-05 DIAGNOSIS — I951 Orthostatic hypotension: Secondary | ICD-10-CM | POA: Diagnosis not present

## 2014-04-05 DIAGNOSIS — D510 Vitamin B12 deficiency anemia due to intrinsic factor deficiency: Secondary | ICD-10-CM | POA: Diagnosis not present

## 2014-04-05 DIAGNOSIS — E119 Type 2 diabetes mellitus without complications: Secondary | ICD-10-CM | POA: Diagnosis not present

## 2014-04-05 DIAGNOSIS — I4891 Unspecified atrial fibrillation: Secondary | ICD-10-CM | POA: Diagnosis not present

## 2014-04-08 DIAGNOSIS — D510 Vitamin B12 deficiency anemia due to intrinsic factor deficiency: Secondary | ICD-10-CM | POA: Diagnosis not present

## 2014-04-08 DIAGNOSIS — I4891 Unspecified atrial fibrillation: Secondary | ICD-10-CM | POA: Diagnosis not present

## 2014-04-08 DIAGNOSIS — I951 Orthostatic hypotension: Secondary | ICD-10-CM | POA: Diagnosis not present

## 2014-04-08 DIAGNOSIS — E119 Type 2 diabetes mellitus without complications: Secondary | ICD-10-CM | POA: Diagnosis not present

## 2014-04-09 DIAGNOSIS — I951 Orthostatic hypotension: Secondary | ICD-10-CM | POA: Diagnosis not present

## 2014-04-09 DIAGNOSIS — I4891 Unspecified atrial fibrillation: Secondary | ICD-10-CM | POA: Diagnosis not present

## 2014-04-09 DIAGNOSIS — D510 Vitamin B12 deficiency anemia due to intrinsic factor deficiency: Secondary | ICD-10-CM | POA: Diagnosis not present

## 2014-04-09 DIAGNOSIS — E119 Type 2 diabetes mellitus without complications: Secondary | ICD-10-CM | POA: Diagnosis not present

## 2014-04-11 DIAGNOSIS — E119 Type 2 diabetes mellitus without complications: Secondary | ICD-10-CM | POA: Diagnosis not present

## 2014-04-11 DIAGNOSIS — I4891 Unspecified atrial fibrillation: Secondary | ICD-10-CM | POA: Diagnosis not present

## 2014-04-11 DIAGNOSIS — I951 Orthostatic hypotension: Secondary | ICD-10-CM | POA: Diagnosis not present

## 2014-04-11 DIAGNOSIS — D510 Vitamin B12 deficiency anemia due to intrinsic factor deficiency: Secondary | ICD-10-CM | POA: Diagnosis not present

## 2014-04-12 DIAGNOSIS — E119 Type 2 diabetes mellitus without complications: Secondary | ICD-10-CM | POA: Diagnosis not present

## 2014-04-12 DIAGNOSIS — I4891 Unspecified atrial fibrillation: Secondary | ICD-10-CM | POA: Diagnosis not present

## 2014-04-12 DIAGNOSIS — D510 Vitamin B12 deficiency anemia due to intrinsic factor deficiency: Secondary | ICD-10-CM | POA: Diagnosis not present

## 2014-04-12 DIAGNOSIS — I951 Orthostatic hypotension: Secondary | ICD-10-CM | POA: Diagnosis not present

## 2014-04-16 DIAGNOSIS — E119 Type 2 diabetes mellitus without complications: Secondary | ICD-10-CM | POA: Diagnosis not present

## 2014-04-16 DIAGNOSIS — I4891 Unspecified atrial fibrillation: Secondary | ICD-10-CM | POA: Diagnosis not present

## 2014-04-16 DIAGNOSIS — I951 Orthostatic hypotension: Secondary | ICD-10-CM | POA: Diagnosis not present

## 2014-04-16 DIAGNOSIS — D510 Vitamin B12 deficiency anemia due to intrinsic factor deficiency: Secondary | ICD-10-CM | POA: Diagnosis not present

## 2014-04-17 DIAGNOSIS — D510 Vitamin B12 deficiency anemia due to intrinsic factor deficiency: Secondary | ICD-10-CM | POA: Diagnosis not present

## 2014-04-17 DIAGNOSIS — I951 Orthostatic hypotension: Secondary | ICD-10-CM | POA: Diagnosis not present

## 2014-04-17 DIAGNOSIS — E119 Type 2 diabetes mellitus without complications: Secondary | ICD-10-CM | POA: Diagnosis not present

## 2014-04-17 DIAGNOSIS — I4891 Unspecified atrial fibrillation: Secondary | ICD-10-CM | POA: Diagnosis not present

## 2014-04-23 DIAGNOSIS — I4891 Unspecified atrial fibrillation: Secondary | ICD-10-CM | POA: Diagnosis not present

## 2014-04-23 DIAGNOSIS — E119 Type 2 diabetes mellitus without complications: Secondary | ICD-10-CM | POA: Diagnosis not present

## 2014-04-23 DIAGNOSIS — D510 Vitamin B12 deficiency anemia due to intrinsic factor deficiency: Secondary | ICD-10-CM | POA: Diagnosis not present

## 2014-04-23 DIAGNOSIS — I951 Orthostatic hypotension: Secondary | ICD-10-CM | POA: Diagnosis not present

## 2014-04-24 DIAGNOSIS — E119 Type 2 diabetes mellitus without complications: Secondary | ICD-10-CM | POA: Diagnosis not present

## 2014-04-24 DIAGNOSIS — I4891 Unspecified atrial fibrillation: Secondary | ICD-10-CM | POA: Diagnosis not present

## 2014-04-24 DIAGNOSIS — D510 Vitamin B12 deficiency anemia due to intrinsic factor deficiency: Secondary | ICD-10-CM | POA: Diagnosis not present

## 2014-04-24 DIAGNOSIS — I951 Orthostatic hypotension: Secondary | ICD-10-CM | POA: Diagnosis not present

## 2014-04-25 DIAGNOSIS — B351 Tinea unguium: Secondary | ICD-10-CM | POA: Diagnosis not present

## 2014-04-25 DIAGNOSIS — L851 Acquired keratosis [keratoderma] palmaris et plantaris: Secondary | ICD-10-CM | POA: Diagnosis not present

## 2014-04-25 DIAGNOSIS — E119 Type 2 diabetes mellitus without complications: Secondary | ICD-10-CM | POA: Diagnosis not present

## 2014-04-27 DIAGNOSIS — I951 Orthostatic hypotension: Secondary | ICD-10-CM | POA: Diagnosis not present

## 2014-04-27 DIAGNOSIS — D510 Vitamin B12 deficiency anemia due to intrinsic factor deficiency: Secondary | ICD-10-CM | POA: Diagnosis not present

## 2014-04-27 DIAGNOSIS — E119 Type 2 diabetes mellitus without complications: Secondary | ICD-10-CM | POA: Diagnosis not present

## 2014-04-27 DIAGNOSIS — I4891 Unspecified atrial fibrillation: Secondary | ICD-10-CM | POA: Diagnosis not present

## 2014-04-30 DIAGNOSIS — D510 Vitamin B12 deficiency anemia due to intrinsic factor deficiency: Secondary | ICD-10-CM | POA: Diagnosis not present

## 2014-04-30 DIAGNOSIS — E119 Type 2 diabetes mellitus without complications: Secondary | ICD-10-CM | POA: Diagnosis not present

## 2014-04-30 DIAGNOSIS — I4891 Unspecified atrial fibrillation: Secondary | ICD-10-CM | POA: Diagnosis not present

## 2014-04-30 DIAGNOSIS — I951 Orthostatic hypotension: Secondary | ICD-10-CM | POA: Diagnosis not present

## 2014-05-02 DIAGNOSIS — D510 Vitamin B12 deficiency anemia due to intrinsic factor deficiency: Secondary | ICD-10-CM | POA: Diagnosis not present

## 2014-05-02 DIAGNOSIS — I4891 Unspecified atrial fibrillation: Secondary | ICD-10-CM | POA: Diagnosis not present

## 2014-05-02 DIAGNOSIS — I951 Orthostatic hypotension: Secondary | ICD-10-CM | POA: Diagnosis not present

## 2014-05-02 DIAGNOSIS — E119 Type 2 diabetes mellitus without complications: Secondary | ICD-10-CM | POA: Diagnosis not present

## 2014-05-03 DIAGNOSIS — E785 Hyperlipidemia, unspecified: Secondary | ICD-10-CM | POA: Diagnosis not present

## 2014-05-03 DIAGNOSIS — E1151 Type 2 diabetes mellitus with diabetic peripheral angiopathy without gangrene: Secondary | ICD-10-CM | POA: Diagnosis not present

## 2014-05-03 DIAGNOSIS — I482 Chronic atrial fibrillation: Secondary | ICD-10-CM | POA: Diagnosis not present

## 2014-05-03 DIAGNOSIS — I10 Essential (primary) hypertension: Secondary | ICD-10-CM | POA: Diagnosis not present

## 2014-05-03 DIAGNOSIS — G40309 Generalized idiopathic epilepsy and epileptic syndromes, not intractable, without status epilepticus: Secondary | ICD-10-CM | POA: Diagnosis not present

## 2014-05-03 DIAGNOSIS — C50912 Malignant neoplasm of unspecified site of left female breast: Secondary | ICD-10-CM | POA: Diagnosis not present

## 2014-05-03 DIAGNOSIS — E039 Hypothyroidism, unspecified: Secondary | ICD-10-CM | POA: Diagnosis not present

## 2014-05-03 DIAGNOSIS — E119 Type 2 diabetes mellitus without complications: Secondary | ICD-10-CM | POA: Diagnosis not present

## 2014-05-04 DIAGNOSIS — I4891 Unspecified atrial fibrillation: Secondary | ICD-10-CM | POA: Diagnosis not present

## 2014-05-04 DIAGNOSIS — I951 Orthostatic hypotension: Secondary | ICD-10-CM | POA: Diagnosis not present

## 2014-05-04 DIAGNOSIS — E119 Type 2 diabetes mellitus without complications: Secondary | ICD-10-CM | POA: Diagnosis not present

## 2014-05-04 DIAGNOSIS — D510 Vitamin B12 deficiency anemia due to intrinsic factor deficiency: Secondary | ICD-10-CM | POA: Diagnosis not present

## 2014-05-07 DIAGNOSIS — I4891 Unspecified atrial fibrillation: Secondary | ICD-10-CM | POA: Diagnosis not present

## 2014-05-07 DIAGNOSIS — E119 Type 2 diabetes mellitus without complications: Secondary | ICD-10-CM | POA: Diagnosis not present

## 2014-05-07 DIAGNOSIS — D510 Vitamin B12 deficiency anemia due to intrinsic factor deficiency: Secondary | ICD-10-CM | POA: Diagnosis not present

## 2014-05-07 DIAGNOSIS — I951 Orthostatic hypotension: Secondary | ICD-10-CM | POA: Diagnosis not present

## 2014-05-09 DIAGNOSIS — I4891 Unspecified atrial fibrillation: Secondary | ICD-10-CM | POA: Diagnosis not present

## 2014-05-09 DIAGNOSIS — I951 Orthostatic hypotension: Secondary | ICD-10-CM | POA: Diagnosis not present

## 2014-05-09 DIAGNOSIS — D510 Vitamin B12 deficiency anemia due to intrinsic factor deficiency: Secondary | ICD-10-CM | POA: Diagnosis not present

## 2014-05-09 DIAGNOSIS — E119 Type 2 diabetes mellitus without complications: Secondary | ICD-10-CM | POA: Diagnosis not present

## 2014-05-10 DIAGNOSIS — I951 Orthostatic hypotension: Secondary | ICD-10-CM | POA: Diagnosis not present

## 2014-05-10 DIAGNOSIS — D510 Vitamin B12 deficiency anemia due to intrinsic factor deficiency: Secondary | ICD-10-CM | POA: Diagnosis not present

## 2014-05-10 DIAGNOSIS — I4891 Unspecified atrial fibrillation: Secondary | ICD-10-CM | POA: Diagnosis not present

## 2014-05-10 DIAGNOSIS — E119 Type 2 diabetes mellitus without complications: Secondary | ICD-10-CM | POA: Diagnosis not present

## 2014-05-14 ENCOUNTER — Ambulatory Visit: Payer: Medicare Other | Admitting: Cardiovascular Disease

## 2014-05-14 DIAGNOSIS — I4891 Unspecified atrial fibrillation: Secondary | ICD-10-CM | POA: Diagnosis not present

## 2014-05-14 DIAGNOSIS — D510 Vitamin B12 deficiency anemia due to intrinsic factor deficiency: Secondary | ICD-10-CM | POA: Diagnosis not present

## 2014-05-14 DIAGNOSIS — I951 Orthostatic hypotension: Secondary | ICD-10-CM | POA: Diagnosis not present

## 2014-05-14 DIAGNOSIS — E119 Type 2 diabetes mellitus without complications: Secondary | ICD-10-CM | POA: Diagnosis not present

## 2014-05-16 DIAGNOSIS — I951 Orthostatic hypotension: Secondary | ICD-10-CM | POA: Diagnosis not present

## 2014-05-16 DIAGNOSIS — E119 Type 2 diabetes mellitus without complications: Secondary | ICD-10-CM | POA: Diagnosis not present

## 2014-05-16 DIAGNOSIS — D510 Vitamin B12 deficiency anemia due to intrinsic factor deficiency: Secondary | ICD-10-CM | POA: Diagnosis not present

## 2014-05-16 DIAGNOSIS — I4891 Unspecified atrial fibrillation: Secondary | ICD-10-CM | POA: Diagnosis not present

## 2014-05-18 DIAGNOSIS — I4891 Unspecified atrial fibrillation: Secondary | ICD-10-CM | POA: Diagnosis not present

## 2014-05-18 DIAGNOSIS — E119 Type 2 diabetes mellitus without complications: Secondary | ICD-10-CM | POA: Diagnosis not present

## 2014-05-18 DIAGNOSIS — D510 Vitamin B12 deficiency anemia due to intrinsic factor deficiency: Secondary | ICD-10-CM | POA: Diagnosis not present

## 2014-05-18 DIAGNOSIS — I951 Orthostatic hypotension: Secondary | ICD-10-CM | POA: Diagnosis not present

## 2014-05-21 DIAGNOSIS — I4891 Unspecified atrial fibrillation: Secondary | ICD-10-CM | POA: Diagnosis not present

## 2014-05-21 DIAGNOSIS — E119 Type 2 diabetes mellitus without complications: Secondary | ICD-10-CM | POA: Diagnosis not present

## 2014-05-21 DIAGNOSIS — D510 Vitamin B12 deficiency anemia due to intrinsic factor deficiency: Secondary | ICD-10-CM | POA: Diagnosis not present

## 2014-05-21 DIAGNOSIS — I951 Orthostatic hypotension: Secondary | ICD-10-CM | POA: Diagnosis not present

## 2014-05-23 DIAGNOSIS — I4891 Unspecified atrial fibrillation: Secondary | ICD-10-CM | POA: Diagnosis not present

## 2014-05-23 DIAGNOSIS — E119 Type 2 diabetes mellitus without complications: Secondary | ICD-10-CM | POA: Diagnosis not present

## 2014-05-23 DIAGNOSIS — I951 Orthostatic hypotension: Secondary | ICD-10-CM | POA: Diagnosis not present

## 2014-05-23 DIAGNOSIS — D510 Vitamin B12 deficiency anemia due to intrinsic factor deficiency: Secondary | ICD-10-CM | POA: Diagnosis not present

## 2014-05-25 DIAGNOSIS — I4891 Unspecified atrial fibrillation: Secondary | ICD-10-CM | POA: Diagnosis not present

## 2014-05-25 DIAGNOSIS — D510 Vitamin B12 deficiency anemia due to intrinsic factor deficiency: Secondary | ICD-10-CM | POA: Diagnosis not present

## 2014-05-25 DIAGNOSIS — I951 Orthostatic hypotension: Secondary | ICD-10-CM | POA: Diagnosis not present

## 2014-05-25 DIAGNOSIS — E119 Type 2 diabetes mellitus without complications: Secondary | ICD-10-CM | POA: Diagnosis not present

## 2014-05-28 DIAGNOSIS — D510 Vitamin B12 deficiency anemia due to intrinsic factor deficiency: Secondary | ICD-10-CM | POA: Diagnosis not present

## 2014-05-28 DIAGNOSIS — I951 Orthostatic hypotension: Secondary | ICD-10-CM | POA: Diagnosis not present

## 2014-05-28 DIAGNOSIS — E119 Type 2 diabetes mellitus without complications: Secondary | ICD-10-CM | POA: Diagnosis not present

## 2014-05-28 DIAGNOSIS — I4891 Unspecified atrial fibrillation: Secondary | ICD-10-CM | POA: Diagnosis not present

## 2014-05-30 DIAGNOSIS — D510 Vitamin B12 deficiency anemia due to intrinsic factor deficiency: Secondary | ICD-10-CM | POA: Diagnosis not present

## 2014-05-30 DIAGNOSIS — I951 Orthostatic hypotension: Secondary | ICD-10-CM | POA: Diagnosis not present

## 2014-05-30 DIAGNOSIS — E119 Type 2 diabetes mellitus without complications: Secondary | ICD-10-CM | POA: Diagnosis not present

## 2014-05-30 DIAGNOSIS — I4891 Unspecified atrial fibrillation: Secondary | ICD-10-CM | POA: Diagnosis not present

## 2014-06-01 DIAGNOSIS — I4891 Unspecified atrial fibrillation: Secondary | ICD-10-CM | POA: Diagnosis not present

## 2014-06-01 DIAGNOSIS — D510 Vitamin B12 deficiency anemia due to intrinsic factor deficiency: Secondary | ICD-10-CM | POA: Diagnosis not present

## 2014-06-01 DIAGNOSIS — I951 Orthostatic hypotension: Secondary | ICD-10-CM | POA: Diagnosis not present

## 2014-06-01 DIAGNOSIS — E119 Type 2 diabetes mellitus without complications: Secondary | ICD-10-CM | POA: Diagnosis not present

## 2014-06-04 DIAGNOSIS — D510 Vitamin B12 deficiency anemia due to intrinsic factor deficiency: Secondary | ICD-10-CM | POA: Diagnosis not present

## 2014-06-04 DIAGNOSIS — I951 Orthostatic hypotension: Secondary | ICD-10-CM | POA: Diagnosis not present

## 2014-06-04 DIAGNOSIS — I4891 Unspecified atrial fibrillation: Secondary | ICD-10-CM | POA: Diagnosis not present

## 2014-06-04 DIAGNOSIS — E119 Type 2 diabetes mellitus without complications: Secondary | ICD-10-CM | POA: Diagnosis not present

## 2014-06-05 DIAGNOSIS — Z9012 Acquired absence of left breast and nipple: Secondary | ICD-10-CM | POA: Diagnosis not present

## 2014-06-05 DIAGNOSIS — C50412 Malignant neoplasm of upper-outer quadrant of left female breast: Secondary | ICD-10-CM | POA: Diagnosis not present

## 2014-06-06 ENCOUNTER — Encounter: Payer: Self-pay | Admitting: General Surgery

## 2014-06-06 DIAGNOSIS — I4891 Unspecified atrial fibrillation: Secondary | ICD-10-CM | POA: Diagnosis not present

## 2014-06-06 DIAGNOSIS — D510 Vitamin B12 deficiency anemia due to intrinsic factor deficiency: Secondary | ICD-10-CM | POA: Diagnosis not present

## 2014-06-06 DIAGNOSIS — I951 Orthostatic hypotension: Secondary | ICD-10-CM | POA: Diagnosis not present

## 2014-06-06 DIAGNOSIS — E119 Type 2 diabetes mellitus without complications: Secondary | ICD-10-CM | POA: Diagnosis not present

## 2014-06-08 DIAGNOSIS — E119 Type 2 diabetes mellitus without complications: Secondary | ICD-10-CM | POA: Diagnosis not present

## 2014-06-08 DIAGNOSIS — I4891 Unspecified atrial fibrillation: Secondary | ICD-10-CM | POA: Diagnosis not present

## 2014-06-08 DIAGNOSIS — I951 Orthostatic hypotension: Secondary | ICD-10-CM | POA: Diagnosis not present

## 2014-06-08 DIAGNOSIS — D510 Vitamin B12 deficiency anemia due to intrinsic factor deficiency: Secondary | ICD-10-CM | POA: Diagnosis not present

## 2014-06-11 ENCOUNTER — Ambulatory Visit: Payer: Medicare Other | Admitting: General Surgery

## 2014-06-11 ENCOUNTER — Ambulatory Visit (INDEPENDENT_AMBULATORY_CARE_PROVIDER_SITE_OTHER): Payer: Medicare Other | Admitting: General Surgery

## 2014-06-11 ENCOUNTER — Encounter: Payer: Self-pay | Admitting: General Surgery

## 2014-06-11 VITALS — BP 142/80 | HR 80 | Resp 16 | Ht 65.5 in | Wt 188.0 lb

## 2014-06-11 DIAGNOSIS — C50412 Malignant neoplasm of upper-outer quadrant of left female breast: Secondary | ICD-10-CM | POA: Diagnosis not present

## 2014-06-11 DIAGNOSIS — C50912 Malignant neoplasm of unspecified site of left female breast: Secondary | ICD-10-CM

## 2014-06-11 DIAGNOSIS — E119 Type 2 diabetes mellitus without complications: Secondary | ICD-10-CM | POA: Diagnosis not present

## 2014-06-11 DIAGNOSIS — I951 Orthostatic hypotension: Secondary | ICD-10-CM | POA: Diagnosis not present

## 2014-06-11 DIAGNOSIS — D510 Vitamin B12 deficiency anemia due to intrinsic factor deficiency: Secondary | ICD-10-CM | POA: Diagnosis not present

## 2014-06-11 DIAGNOSIS — I4891 Unspecified atrial fibrillation: Secondary | ICD-10-CM | POA: Diagnosis not present

## 2014-06-11 MED ORDER — ANASTROZOLE 1 MG PO TABS: 1.0000 mg | ORAL_TABLET | Freq: Every day | ORAL | Status: DC

## 2014-06-11 NOTE — Progress Notes (Signed)
Patient ID: Mia Padilla, female   DOB: 1934-12-10, 80 y.o.   MRN: [id number]  Chief Complaint  Patient presents with  . Follow-up    mammogram     HPI Mia Padilla is a 80 y.o. female who presents for a breast evaluation. The most recent mammogram was done on 06/05/14. Patient does perform regular self breast checks and gets regular mammograms done.  Patient is doing well.    HPI  Past Medical History  Diagnosis Date  . Hypotension   . DM type 2 (diabetes mellitus, type 2)   . History of seizures   . Obesity   . Debilitated   . Hypertension   . Thyroid disease   . Hyperlipidemia   . Bowel incontinence   . Urinary incontinence   . Atrial fibrillation, new onset   . Glaucoma   . Black head   . Arthritis     hands  . Carpal tunnel syndrome   . Breast cancer of upper-outer quadrant of left female breast 2015    Past Surgical History  Procedure Laterality Date  . Partial hysterectomy    . Tonsillectomy    . Umbilical hernia repair    . Carpal tunnel release Right 30 years   . Bowel resection  2005  . Breast surgery  08/03/13    left mastectomy  . Mastectomy      left     Family History  Problem Relation Age of Onset  . Cervical cancer Mother   . Cervical cancer Daughter     Social History History  Substance Use Topics  . Smoking status: Never Smoker   . Smokeless tobacco: Never Used  . Alcohol Use: No    Allergies  Allergen Reactions  . Darvocet [Propoxyphene N-Acetaminophen]   . Percocet [Oxycodone-Acetaminophen]     hallucination    Current Outpatient Prescriptions  Medication Sig Dispense Refill  . acetaminophen (TYLENOL) 500 MG tablet Take 500 mg by mouth every 6 (six) hours as needed.    Marland Kitchen albuterol (PROVENTIL HFA;VENTOLIN HFA) 108 (90 BASE) MCG/ACT inhaler Inhale 2 puffs into the lungs every 6 (six) hours as needed.    Marland Kitchen alendronate (FOSAMAX) 70 MG tablet Take 70 mg by mouth once a week. Take with a full glass of water on an empty stomach.    Marland Kitchen  anastrozole (ARIMIDEX) 1 MG tablet Take 1 tablet (1 mg total) by mouth daily. 30 tablet 12  . anastrozole (ARIMIDEX) 1 MG tablet Take 1 tablet (1 mg total) by mouth daily. 30 tablet 12  . aspirin EC 81 MG tablet Take 81 mg by mouth daily.    . brimonidine (ALPHAGAN P) 0.1 % SOLN     . brinzolamide (AZOPT) 1 % ophthalmic suspension Apply 1 drop to eye 3 (three) times daily.    . Canagliflozin (INVOKANA) 100 MG TABS Take 100 mg by mouth daily.    . clopidogrel (PLAVIX) 75 MG tablet Take 75 mg by mouth daily.    . digoxin (LANOXIN) 0.125 MG tablet Take 125 mcg by mouth daily.    . dorzolamide (TRUSOPT) 2 % ophthalmic solution 1 drop 3 (three) times daily.    . fluticasone (FLONASE) 50 MCG/ACT nasal spray Place 2 sprays into the nose daily.    . Fluticasone-Salmeterol (ADVAIR) 250-50 MCG/DOSE AEPB Inhale 1 puff into the lungs every 12 (twelve) hours.    Marland Kitchen guaiFENesin (MUCINEX) 600 MG 12 hr tablet Take 1,200 mg by mouth 2 (two) times daily.    Marland Kitchen  insulin detemir (LEVEMIR) 100 UNIT/ML injection Inject 23 Units into the skin at bedtime.    Marland Kitchen levothyroxine (SYNTHROID, LEVOTHROID) 75 MCG tablet Take 75 mcg by mouth daily.    Marland Kitchen lisinopril (PRINIVIL,ZESTRIL) 10 MG tablet Take 1 tablet (10 mg total) by mouth daily. 30 tablet 6  . meclizine (ANTIVERT) 25 MG tablet Take 25 mg by mouth 3 (three) times daily as needed.    . metFORMIN (GLUCOPHAGE) 500 MG tablet Take 500 mg by mouth 2 (two) times daily with a meal.    . metoprolol succinate (TOPROL-XL) 100 MG 24 hr tablet Take 100 mg by mouth daily.     . mupirocin ointment (BACTROBAN) 2 % Place 1 application into the nose 2 (two) times daily.    . Olopatadine HCl (PATADAY) 0.2 % SOLN Apply to eye daily.    . solifenacin (VESICARE) 5 MG tablet Take 10 mg by mouth daily.    . travoprost, benzalkonium, (TRAVATAN) 0.004 % ophthalmic solution Apply 1 drop to eye at bedtime.     No current facility-administered medications for this visit.    Review of  Systems Review of Systems  Constitutional: Negative.   Respiratory: Negative.   Cardiovascular: Negative.     Blood pressure 142/80, pulse 80, resp. rate 16, height 5' 5.5" (1.664 m), weight 188 lb (85.276 kg).  Physical Exam Physical Exam  Constitutional: She is oriented to person, place, and time. She appears well-developed and well-nourished.  Eyes: Conjunctivae are normal. No scleral icterus.  Neck: Neck supple. No thyromegaly present.  Cardiovascular: Normal rate, regular rhythm and normal heart sounds.   No murmur heard. Pulmonary/Chest: Effort normal and breath sounds normal. Right breast exhibits no inverted nipple, no mass, no nipple discharge, no skin change and no tenderness.  Left mastectomy site well healed.   Lymphadenopathy:    She has no cervical adenopathy.    She has no axillary adenopathy.  Neurological: She is alert and oriented to person, place, and time.  Skin: Skin is warm and dry.    Data Reviewed Right mammogram stable.   Assessment    Stable exam 1 year post mastectomy. Stage 1 cancer. ER/PR postive Her 2 negative. Now on Arimedex and doing well.     Plan    1 year follow up right diagnostic mammogram.        SANKAR,SEEPLAPUTHUR G 06/11/2014, 11:07 AM

## 2014-06-11 NOTE — Patient Instructions (Signed)
1 year follow up right diagnostic mammogram. The patient is aware to call back for any questions or concerns.

## 2014-06-13 DIAGNOSIS — I951 Orthostatic hypotension: Secondary | ICD-10-CM | POA: Diagnosis not present

## 2014-06-13 DIAGNOSIS — D510 Vitamin B12 deficiency anemia due to intrinsic factor deficiency: Secondary | ICD-10-CM | POA: Diagnosis not present

## 2014-06-13 DIAGNOSIS — E119 Type 2 diabetes mellitus without complications: Secondary | ICD-10-CM | POA: Diagnosis not present

## 2014-06-13 DIAGNOSIS — I4891 Unspecified atrial fibrillation: Secondary | ICD-10-CM | POA: Diagnosis not present

## 2014-06-14 ENCOUNTER — Ambulatory Visit (INDEPENDENT_AMBULATORY_CARE_PROVIDER_SITE_OTHER): Payer: Medicare Other | Admitting: Cardiovascular Disease

## 2014-06-14 ENCOUNTER — Encounter: Payer: Self-pay | Admitting: Cardiovascular Disease

## 2014-06-14 VITALS — BP 136/75 | HR 77 | Ht 65.5 in | Wt 209.8 lb

## 2014-06-14 DIAGNOSIS — I4891 Unspecified atrial fibrillation: Secondary | ICD-10-CM | POA: Diagnosis not present

## 2014-06-14 DIAGNOSIS — E669 Obesity, unspecified: Secondary | ICD-10-CM | POA: Diagnosis not present

## 2014-06-14 DIAGNOSIS — D510 Vitamin B12 deficiency anemia due to intrinsic factor deficiency: Secondary | ICD-10-CM | POA: Diagnosis not present

## 2014-06-14 DIAGNOSIS — E119 Type 2 diabetes mellitus without complications: Secondary | ICD-10-CM | POA: Diagnosis not present

## 2014-06-14 DIAGNOSIS — I10 Essential (primary) hypertension: Secondary | ICD-10-CM | POA: Diagnosis not present

## 2014-06-14 DIAGNOSIS — C50412 Malignant neoplasm of upper-outer quadrant of left female breast: Secondary | ICD-10-CM | POA: Diagnosis not present

## 2014-06-14 DIAGNOSIS — I5033 Acute on chronic diastolic (congestive) heart failure: Secondary | ICD-10-CM | POA: Diagnosis not present

## 2014-06-14 DIAGNOSIS — R569 Unspecified convulsions: Secondary | ICD-10-CM | POA: Diagnosis not present

## 2014-06-14 DIAGNOSIS — E118 Type 2 diabetes mellitus with unspecified complications: Secondary | ICD-10-CM | POA: Diagnosis not present

## 2014-06-14 DIAGNOSIS — I951 Orthostatic hypotension: Secondary | ICD-10-CM | POA: Diagnosis not present

## 2014-06-14 MED ORDER — FUROSEMIDE 20 MG PO TABS: 20.0000 mg | ORAL_TABLET | Freq: Every day | ORAL | Status: DC | PRN

## 2014-06-14 MED ORDER — POTASSIUM CHLORIDE ER 10 MEQ PO TBCR: 10.0000 meq | EXTENDED_RELEASE_TABLET | Freq: Every day | ORAL | Status: DC | PRN

## 2014-06-14 NOTE — Assessment & Plan Note (Signed)
She denies any recent seizures.

## 2014-06-14 NOTE — Patient Instructions (Addendum)
You are doing well. Your legs are swollen  Please start lasix/furosemide with potassium three days to start, or as needed for leg swelling  Please call us if you have new issues that need to be addressed before your next appt.  Your physician wants you to follow-up in: 6 months.  You will receive a reminder letter in the mail two months in advance. If you don't receive a letter, please call our office to schedule the follow-up appointment.

## 2014-06-14 NOTE — Assessment & Plan Note (Signed)
Recommended a strict diet. She is unable to exercise

## 2014-06-14 NOTE — Assessment & Plan Note (Signed)
Worsening leg edema concerning for diastolic CHF. Will start Lasix 20 mg every other day with K-Dur 10 mEq Legs are more swollen, blistering noted, weight is up 9 pounds from prior clinic visit

## 2014-06-14 NOTE — Assessment & Plan Note (Signed)
Maintaining normal sinus rhythm on her current medication regimen. Currently not on anticoagulation, only aspirin

## 2014-06-14 NOTE — Assessment & Plan Note (Signed)
Blood pressure is well controlled on today's visit. No changes made to the medications. 

## 2014-06-14 NOTE — Assessment & Plan Note (Signed)
Tolerating arimidex. She states that she will need to be on this for 5 years

## 2014-06-14 NOTE — Progress Notes (Signed)
Patient ID: Mia Padilla, female    DOB: 1935-03-13, 80 y.o.   MRN: [id number]  HPI Comments: Mia Padilla is a 80 year old woman with history of stroke, seizure disorder, hypertension, COPD, diabetes, hypothyroidism, obesity, atrial fibrillation on hospital admission April 04 2011 for mental status changes. During her hospital course, she had an echocardiogram showing normal LV function, carotid ultrasound showing no significant disease, head CT and MRI showing no significant stroke. She reports having a "code" called on her in the hospital.  Notes indicate possible TIA. She was started on warfarin for atrial fibrillation  She presents today for follow up of her atrial fibrillation.   In followup today, she reports that she is doing well.  She has been having some worsening lower extremity edema, blistering, also with weight gain since her last clinic visit. She does not use compression hose. Previously had to wrap her legs at the wound clinic. She denies any significant shortness of breath or abdominal swelling. Significant gait instability, uses a walker  significant osteoarthritis of her knees. H/o  mastectomy 08/03/2013 for breast cancer by Mia Padilla. No radiation treatment or chemotherapy needed, she is on Arimidex.  She reports blood pressure at home is typically well controlled. Otherwise she denies any new symptoms  EKG on today's visit shows normal sinus rhythm with rate 77 bpm, no significant ST or T-wave changes  Lab work reviewed with her showing total cholesterol 147, LDL 84 Hemoglobin A1c 7.8, previously was 7.0  Other past medical history Coumadin was stopped in the past secondary to unsteady gait by Mia Padilla. She was started on Plavix 10/13/2011. She denies any recent falls.  Periods of tachycardia in the past, better on metoprolol Notes indicate that she has had a history of seizures, possibly with low glucose levels. Previous diagnosis of complex partial seizures and  she was started on antiseizure medications. Weaned off Keppra     Allergies  Allergen Reactions  . Darvocet [Propoxyphene N-Acetaminophen]   . Percocet [Oxycodone-Acetaminophen]     hallucination    Outpatient Encounter Prescriptions as of 06/14/2014  Medication Sig  . acetaminophen (TYLENOL) 500 MG tablet Take 500 mg by mouth every 6 (six) hours as needed.  Marland Kitchen albuterol (PROVENTIL HFA;VENTOLIN HFA) 108 (90 BASE) MCG/ACT inhaler Inhale 2 puffs into the lungs every 6 (six) hours as needed.  Marland Kitchen alendronate (FOSAMAX) 70 MG tablet Take 70 mg by mouth once a week. Take with a full glass of water on an empty stomach.  Marland Kitchen anastrozole (ARIMIDEX) 1 MG tablet Take 1 tablet (1 mg total) by mouth daily.  Marland Kitchen aspirin EC 81 MG tablet Take 81 mg by mouth daily.  . brimonidine (ALPHAGAN P) 0.1 % SOLN 2 (two) times daily.   . brinzolamide (AZOPT) 1 % ophthalmic suspension Apply 1 drop to eye 2 (two) times daily.   . Canagliflozin (INVOKANA) 100 MG TABS Take 100 mg by mouth daily.  . clopidogrel (PLAVIX) 75 MG tablet Take 75 mg by mouth daily.  . digoxin (LANOXIN) 0.125 MG tablet Take 125 mcg by mouth daily.  . dorzolamide (TRUSOPT) 2 % ophthalmic solution 1 drop 2 (two) times daily.   . fluticasone (FLONASE) 50 MCG/ACT nasal spray Place 2 sprays into the nose daily.  . Fluticasone-Salmeterol (ADVAIR) 250-50 MCG/DOSE AEPB Inhale 1 puff into the lungs every 12 (twelve) hours.  Marland Kitchen guaiFENesin (MUCINEX) 600 MG 12 hr tablet Take 1,200 mg by mouth 2 (two) times daily.  . insulin detemir (LEVEMIR) 100 UNIT/ML injection  Inject 26 Units into the skin at bedtime.   Marland Kitchen levothyroxine (SYNTHROID, LEVOTHROID) 75 MCG tablet Take 75 mcg by mouth daily.  Marland Kitchen lisinopril (PRINIVIL,ZESTRIL) 10 MG tablet Take 1 tablet (10 mg total) by mouth daily.  . meclizine (ANTIVERT) 25 MG tablet Take 25 mg by mouth 3 (three) times daily as needed.  . metFORMIN (GLUCOPHAGE) 500 MG tablet Take 500 mg by mouth 2 (two) times daily with a meal.  .  mupirocin ointment (BACTROBAN) 2 % Place 1 application into the nose 2 (two) times daily.  . Olopatadine HCl (PATADAY) 0.2 % SOLN Apply to eye daily.  . solifenacin (VESICARE) 5 MG tablet Take 10 mg by mouth daily.  . travoprost, benzalkonium, (TRAVATAN) 0.004 % ophthalmic solution Apply 1 drop to eye at bedtime.  . furosemide (LASIX) 20 MG tablet Take 1 tablet (20 mg total) by mouth daily as needed.  . metoprolol succinate (TOPROL-XL) 100 MG 24 hr tablet Take 100 mg by mouth daily.   . potassium chloride (K-DUR) 10 MEQ tablet Take 1 tablet (10 mEq total) by mouth daily as needed.  . [DISCONTINUED] anastrozole (ARIMIDEX) 1 MG tablet Take 1 tablet (1 mg total) by mouth daily. (Patient not taking: Reported on 06/14/2014)    Past Medical History  Diagnosis Date  . Hypotension   . DM type 2 (diabetes mellitus, type 2)   . History of seizures   . Obesity   . Debilitated   . Hypertension   . Thyroid disease   . Hyperlipidemia   . Bowel incontinence   . Urinary incontinence   . Atrial fibrillation, new onset   . Glaucoma   . Black head   . Arthritis     hands  . Carpal tunnel syndrome   . Breast cancer of upper-outer quadrant of left female breast 2015    Past Surgical History  Procedure Laterality Date  . Partial hysterectomy    . Tonsillectomy    . Umbilical hernia repair    . Carpal tunnel release Right 30 years   . Bowel resection  2005  . Breast surgery  08/03/13    left mastectomy  . Mastectomy      left     Social History  reports that she has never smoked. She has never used smokeless tobacco. She reports that she does not drink alcohol or use illicit drugs.  Family History family history includes Cervical cancer in her daughter and mother.       Review of Systems  Constitutional: Negative.   Respiratory: Negative.   Cardiovascular: Positive for leg swelling.  Gastrointestinal: Negative.   Musculoskeletal: Positive for back pain, joint swelling, arthralgias  and gait problem.  Skin: Negative.   Neurological: Negative.   Hematological: Negative.   Psychiatric/Behavioral: Negative.   All other systems reviewed and are negative.  BP 136/75 mmHg  Pulse 77  Ht 5' 5.5" (1.664 m)  Wt 209 lb 12 oz (95.142 kg)  BMI 34.36 kg/m2  Physical Exam  Constitutional: She is oriented to person, place, and time. She appears well-developed and well-nourished.  Obese, walks with a walker  HENT:  Head: Normocephalic.  Nose: Nose normal.  Mouth/Throat: Oropharynx is clear and moist.  Eyes: Conjunctivae are normal. Pupils are equal, round, and reactive to light.  Neck: Normal range of motion. Neck supple. No JVD present.  Cardiovascular: Normal rate, regular rhythm, S1 normal, S2 normal and intact distal pulses.  Exam reveals no gallop and no friction rub.   Murmur  heard.  Crescendo systolic murmur is present with a grade of 2/6  Trace lower extremity edema  Pulmonary/Chest: Effort normal and breath sounds normal. No respiratory distress. She has no wheezes. She has no rales. She exhibits no tenderness.  Abdominal: Soft. Bowel sounds are normal. She exhibits no distension. There is no tenderness.  Musculoskeletal: Normal range of motion. She exhibits no edema or tenderness.  Lymphadenopathy:    She has no cervical adenopathy.  Neurological: She is alert and oriented to person, place, and time. Coordination normal.  Skin: Skin is warm and dry. No rash noted. No erythema.  Psychiatric: She has a normal mood and affect. Her behavior is normal. Judgment and thought content normal.    Assessment and Plan  Nursing note and vitals reviewed.

## 2014-06-14 NOTE — Assessment & Plan Note (Addendum)
We have encouraged continued careful diet management in an effort to lose weight.  

## 2014-06-15 DIAGNOSIS — I951 Orthostatic hypotension: Secondary | ICD-10-CM | POA: Diagnosis not present

## 2014-06-15 DIAGNOSIS — E119 Type 2 diabetes mellitus without complications: Secondary | ICD-10-CM | POA: Diagnosis not present

## 2014-06-15 DIAGNOSIS — D510 Vitamin B12 deficiency anemia due to intrinsic factor deficiency: Secondary | ICD-10-CM | POA: Diagnosis not present

## 2014-06-15 DIAGNOSIS — I4891 Unspecified atrial fibrillation: Secondary | ICD-10-CM | POA: Diagnosis not present

## 2014-06-18 DIAGNOSIS — I951 Orthostatic hypotension: Secondary | ICD-10-CM | POA: Diagnosis not present

## 2014-06-18 DIAGNOSIS — E119 Type 2 diabetes mellitus without complications: Secondary | ICD-10-CM | POA: Diagnosis not present

## 2014-06-18 DIAGNOSIS — I4891 Unspecified atrial fibrillation: Secondary | ICD-10-CM | POA: Diagnosis not present

## 2014-06-18 DIAGNOSIS — D510 Vitamin B12 deficiency anemia due to intrinsic factor deficiency: Secondary | ICD-10-CM | POA: Diagnosis not present

## 2014-06-20 DIAGNOSIS — E119 Type 2 diabetes mellitus without complications: Secondary | ICD-10-CM | POA: Diagnosis not present

## 2014-06-20 DIAGNOSIS — D510 Vitamin B12 deficiency anemia due to intrinsic factor deficiency: Secondary | ICD-10-CM | POA: Diagnosis not present

## 2014-06-20 DIAGNOSIS — I951 Orthostatic hypotension: Secondary | ICD-10-CM | POA: Diagnosis not present

## 2014-06-20 DIAGNOSIS — I4891 Unspecified atrial fibrillation: Secondary | ICD-10-CM | POA: Diagnosis not present

## 2014-06-22 DIAGNOSIS — E119 Type 2 diabetes mellitus without complications: Secondary | ICD-10-CM | POA: Diagnosis not present

## 2014-06-22 DIAGNOSIS — I951 Orthostatic hypotension: Secondary | ICD-10-CM | POA: Diagnosis not present

## 2014-06-22 DIAGNOSIS — D510 Vitamin B12 deficiency anemia due to intrinsic factor deficiency: Secondary | ICD-10-CM | POA: Diagnosis not present

## 2014-06-22 DIAGNOSIS — I4891 Unspecified atrial fibrillation: Secondary | ICD-10-CM | POA: Diagnosis not present

## 2014-06-25 DIAGNOSIS — D510 Vitamin B12 deficiency anemia due to intrinsic factor deficiency: Secondary | ICD-10-CM | POA: Diagnosis not present

## 2014-06-25 DIAGNOSIS — E119 Type 2 diabetes mellitus without complications: Secondary | ICD-10-CM | POA: Diagnosis not present

## 2014-06-25 DIAGNOSIS — I4891 Unspecified atrial fibrillation: Secondary | ICD-10-CM | POA: Diagnosis not present

## 2014-06-25 DIAGNOSIS — I951 Orthostatic hypotension: Secondary | ICD-10-CM | POA: Diagnosis not present

## 2014-06-27 DIAGNOSIS — D510 Vitamin B12 deficiency anemia due to intrinsic factor deficiency: Secondary | ICD-10-CM | POA: Diagnosis not present

## 2014-06-27 DIAGNOSIS — I951 Orthostatic hypotension: Secondary | ICD-10-CM | POA: Diagnosis not present

## 2014-06-27 DIAGNOSIS — I4891 Unspecified atrial fibrillation: Secondary | ICD-10-CM | POA: Diagnosis not present

## 2014-06-27 DIAGNOSIS — E119 Type 2 diabetes mellitus without complications: Secondary | ICD-10-CM | POA: Diagnosis not present

## 2014-06-29 DIAGNOSIS — I951 Orthostatic hypotension: Secondary | ICD-10-CM | POA: Diagnosis not present

## 2014-06-29 DIAGNOSIS — D510 Vitamin B12 deficiency anemia due to intrinsic factor deficiency: Secondary | ICD-10-CM | POA: Diagnosis not present

## 2014-06-29 DIAGNOSIS — E119 Type 2 diabetes mellitus without complications: Secondary | ICD-10-CM | POA: Diagnosis not present

## 2014-06-29 DIAGNOSIS — I4891 Unspecified atrial fibrillation: Secondary | ICD-10-CM | POA: Diagnosis not present

## 2014-07-02 DIAGNOSIS — I4891 Unspecified atrial fibrillation: Secondary | ICD-10-CM | POA: Diagnosis not present

## 2014-07-02 DIAGNOSIS — E119 Type 2 diabetes mellitus without complications: Secondary | ICD-10-CM | POA: Diagnosis not present

## 2014-07-02 DIAGNOSIS — D510 Vitamin B12 deficiency anemia due to intrinsic factor deficiency: Secondary | ICD-10-CM | POA: Diagnosis not present

## 2014-07-02 DIAGNOSIS — I951 Orthostatic hypotension: Secondary | ICD-10-CM | POA: Diagnosis not present

## 2014-07-04 DIAGNOSIS — D510 Vitamin B12 deficiency anemia due to intrinsic factor deficiency: Secondary | ICD-10-CM | POA: Diagnosis not present

## 2014-07-04 DIAGNOSIS — I951 Orthostatic hypotension: Secondary | ICD-10-CM | POA: Diagnosis not present

## 2014-07-04 DIAGNOSIS — I4891 Unspecified atrial fibrillation: Secondary | ICD-10-CM | POA: Diagnosis not present

## 2014-07-04 DIAGNOSIS — E119 Type 2 diabetes mellitus without complications: Secondary | ICD-10-CM | POA: Diagnosis not present

## 2014-07-05 DIAGNOSIS — H4011X3 Primary open-angle glaucoma, severe stage: Secondary | ICD-10-CM | POA: Diagnosis not present

## 2014-07-06 DIAGNOSIS — D510 Vitamin B12 deficiency anemia due to intrinsic factor deficiency: Secondary | ICD-10-CM | POA: Diagnosis not present

## 2014-07-06 DIAGNOSIS — I4891 Unspecified atrial fibrillation: Secondary | ICD-10-CM | POA: Diagnosis not present

## 2014-07-06 DIAGNOSIS — E119 Type 2 diabetes mellitus without complications: Secondary | ICD-10-CM | POA: Diagnosis not present

## 2014-07-06 DIAGNOSIS — I951 Orthostatic hypotension: Secondary | ICD-10-CM | POA: Diagnosis not present

## 2014-07-09 DIAGNOSIS — E119 Type 2 diabetes mellitus without complications: Secondary | ICD-10-CM | POA: Diagnosis not present

## 2014-07-09 DIAGNOSIS — D510 Vitamin B12 deficiency anemia due to intrinsic factor deficiency: Secondary | ICD-10-CM | POA: Diagnosis not present

## 2014-07-09 DIAGNOSIS — I4891 Unspecified atrial fibrillation: Secondary | ICD-10-CM | POA: Diagnosis not present

## 2014-07-09 DIAGNOSIS — I951 Orthostatic hypotension: Secondary | ICD-10-CM | POA: Diagnosis not present

## 2014-07-11 DIAGNOSIS — I951 Orthostatic hypotension: Secondary | ICD-10-CM | POA: Diagnosis not present

## 2014-07-11 DIAGNOSIS — E119 Type 2 diabetes mellitus without complications: Secondary | ICD-10-CM | POA: Diagnosis not present

## 2014-07-11 DIAGNOSIS — I4891 Unspecified atrial fibrillation: Secondary | ICD-10-CM | POA: Diagnosis not present

## 2014-07-11 DIAGNOSIS — D510 Vitamin B12 deficiency anemia due to intrinsic factor deficiency: Secondary | ICD-10-CM | POA: Diagnosis not present

## 2014-07-13 DIAGNOSIS — E119 Type 2 diabetes mellitus without complications: Secondary | ICD-10-CM | POA: Diagnosis not present

## 2014-07-13 DIAGNOSIS — D510 Vitamin B12 deficiency anemia due to intrinsic factor deficiency: Secondary | ICD-10-CM | POA: Diagnosis not present

## 2014-07-13 DIAGNOSIS — I4891 Unspecified atrial fibrillation: Secondary | ICD-10-CM | POA: Diagnosis not present

## 2014-07-13 DIAGNOSIS — I951 Orthostatic hypotension: Secondary | ICD-10-CM | POA: Diagnosis not present

## 2014-07-15 DIAGNOSIS — I4891 Unspecified atrial fibrillation: Secondary | ICD-10-CM | POA: Diagnosis not present

## 2014-07-15 DIAGNOSIS — E119 Type 2 diabetes mellitus without complications: Secondary | ICD-10-CM | POA: Diagnosis not present

## 2014-07-15 DIAGNOSIS — I951 Orthostatic hypotension: Secondary | ICD-10-CM | POA: Diagnosis not present

## 2014-07-15 DIAGNOSIS — D510 Vitamin B12 deficiency anemia due to intrinsic factor deficiency: Secondary | ICD-10-CM | POA: Diagnosis not present

## 2014-07-16 DIAGNOSIS — I951 Orthostatic hypotension: Secondary | ICD-10-CM | POA: Diagnosis not present

## 2014-07-16 DIAGNOSIS — E119 Type 2 diabetes mellitus without complications: Secondary | ICD-10-CM | POA: Diagnosis not present

## 2014-07-16 DIAGNOSIS — D510 Vitamin B12 deficiency anemia due to intrinsic factor deficiency: Secondary | ICD-10-CM | POA: Diagnosis not present

## 2014-07-16 DIAGNOSIS — I4891 Unspecified atrial fibrillation: Secondary | ICD-10-CM | POA: Diagnosis not present

## 2014-07-18 DIAGNOSIS — I4891 Unspecified atrial fibrillation: Secondary | ICD-10-CM | POA: Diagnosis not present

## 2014-07-18 DIAGNOSIS — E119 Type 2 diabetes mellitus without complications: Secondary | ICD-10-CM | POA: Diagnosis not present

## 2014-07-18 DIAGNOSIS — I951 Orthostatic hypotension: Secondary | ICD-10-CM | POA: Diagnosis not present

## 2014-07-18 DIAGNOSIS — D510 Vitamin B12 deficiency anemia due to intrinsic factor deficiency: Secondary | ICD-10-CM | POA: Diagnosis not present

## 2014-07-20 DIAGNOSIS — I951 Orthostatic hypotension: Secondary | ICD-10-CM | POA: Diagnosis not present

## 2014-07-20 DIAGNOSIS — D510 Vitamin B12 deficiency anemia due to intrinsic factor deficiency: Secondary | ICD-10-CM | POA: Diagnosis not present

## 2014-07-20 DIAGNOSIS — I4891 Unspecified atrial fibrillation: Secondary | ICD-10-CM | POA: Diagnosis not present

## 2014-07-20 DIAGNOSIS — E119 Type 2 diabetes mellitus without complications: Secondary | ICD-10-CM | POA: Diagnosis not present

## 2014-07-23 DIAGNOSIS — I951 Orthostatic hypotension: Secondary | ICD-10-CM | POA: Diagnosis not present

## 2014-07-23 DIAGNOSIS — I4891 Unspecified atrial fibrillation: Secondary | ICD-10-CM | POA: Diagnosis not present

## 2014-07-23 DIAGNOSIS — D510 Vitamin B12 deficiency anemia due to intrinsic factor deficiency: Secondary | ICD-10-CM | POA: Diagnosis not present

## 2014-07-23 DIAGNOSIS — E119 Type 2 diabetes mellitus without complications: Secondary | ICD-10-CM | POA: Diagnosis not present

## 2014-07-25 DIAGNOSIS — I951 Orthostatic hypotension: Secondary | ICD-10-CM | POA: Diagnosis not present

## 2014-07-25 DIAGNOSIS — E119 Type 2 diabetes mellitus without complications: Secondary | ICD-10-CM | POA: Diagnosis not present

## 2014-07-25 DIAGNOSIS — D510 Vitamin B12 deficiency anemia due to intrinsic factor deficiency: Secondary | ICD-10-CM | POA: Diagnosis not present

## 2014-07-25 DIAGNOSIS — I4891 Unspecified atrial fibrillation: Secondary | ICD-10-CM | POA: Diagnosis not present

## 2014-07-27 DIAGNOSIS — D510 Vitamin B12 deficiency anemia due to intrinsic factor deficiency: Secondary | ICD-10-CM | POA: Diagnosis not present

## 2014-07-27 DIAGNOSIS — I951 Orthostatic hypotension: Secondary | ICD-10-CM | POA: Diagnosis not present

## 2014-07-27 DIAGNOSIS — E119 Type 2 diabetes mellitus without complications: Secondary | ICD-10-CM | POA: Diagnosis not present

## 2014-07-27 DIAGNOSIS — I4891 Unspecified atrial fibrillation: Secondary | ICD-10-CM | POA: Diagnosis not present

## 2014-07-30 DIAGNOSIS — I951 Orthostatic hypotension: Secondary | ICD-10-CM | POA: Diagnosis not present

## 2014-07-30 DIAGNOSIS — D510 Vitamin B12 deficiency anemia due to intrinsic factor deficiency: Secondary | ICD-10-CM | POA: Diagnosis not present

## 2014-07-30 DIAGNOSIS — E119 Type 2 diabetes mellitus without complications: Secondary | ICD-10-CM | POA: Diagnosis not present

## 2014-07-30 DIAGNOSIS — I4891 Unspecified atrial fibrillation: Secondary | ICD-10-CM | POA: Diagnosis not present

## 2014-07-30 DIAGNOSIS — B351 Tinea unguium: Secondary | ICD-10-CM | POA: Diagnosis not present

## 2014-08-01 DIAGNOSIS — D510 Vitamin B12 deficiency anemia due to intrinsic factor deficiency: Secondary | ICD-10-CM | POA: Diagnosis not present

## 2014-08-01 DIAGNOSIS — I951 Orthostatic hypotension: Secondary | ICD-10-CM | POA: Diagnosis not present

## 2014-08-01 DIAGNOSIS — I4891 Unspecified atrial fibrillation: Secondary | ICD-10-CM | POA: Diagnosis not present

## 2014-08-01 DIAGNOSIS — E119 Type 2 diabetes mellitus without complications: Secondary | ICD-10-CM | POA: Diagnosis not present

## 2014-08-02 DIAGNOSIS — I10 Essential (primary) hypertension: Secondary | ICD-10-CM | POA: Diagnosis not present

## 2014-08-02 DIAGNOSIS — E119 Type 2 diabetes mellitus without complications: Secondary | ICD-10-CM | POA: Diagnosis not present

## 2014-08-02 DIAGNOSIS — E785 Hyperlipidemia, unspecified: Secondary | ICD-10-CM | POA: Diagnosis not present

## 2014-08-02 DIAGNOSIS — E1151 Type 2 diabetes mellitus with diabetic peripheral angiopathy without gangrene: Secondary | ICD-10-CM | POA: Diagnosis not present

## 2014-08-06 DIAGNOSIS — I951 Orthostatic hypotension: Secondary | ICD-10-CM | POA: Diagnosis not present

## 2014-08-06 DIAGNOSIS — I4891 Unspecified atrial fibrillation: Secondary | ICD-10-CM | POA: Diagnosis not present

## 2014-08-06 DIAGNOSIS — D510 Vitamin B12 deficiency anemia due to intrinsic factor deficiency: Secondary | ICD-10-CM | POA: Diagnosis not present

## 2014-08-06 DIAGNOSIS — E119 Type 2 diabetes mellitus without complications: Secondary | ICD-10-CM | POA: Diagnosis not present

## 2014-08-08 DIAGNOSIS — D510 Vitamin B12 deficiency anemia due to intrinsic factor deficiency: Secondary | ICD-10-CM | POA: Diagnosis not present

## 2014-08-08 DIAGNOSIS — E119 Type 2 diabetes mellitus without complications: Secondary | ICD-10-CM | POA: Diagnosis not present

## 2014-08-08 DIAGNOSIS — I4891 Unspecified atrial fibrillation: Secondary | ICD-10-CM | POA: Diagnosis not present

## 2014-08-08 DIAGNOSIS — I951 Orthostatic hypotension: Secondary | ICD-10-CM | POA: Diagnosis not present

## 2014-08-10 DIAGNOSIS — I951 Orthostatic hypotension: Secondary | ICD-10-CM | POA: Diagnosis not present

## 2014-08-10 DIAGNOSIS — E119 Type 2 diabetes mellitus without complications: Secondary | ICD-10-CM | POA: Diagnosis not present

## 2014-08-10 DIAGNOSIS — I4891 Unspecified atrial fibrillation: Secondary | ICD-10-CM | POA: Diagnosis not present

## 2014-08-10 DIAGNOSIS — D510 Vitamin B12 deficiency anemia due to intrinsic factor deficiency: Secondary | ICD-10-CM | POA: Diagnosis not present

## 2014-08-11 NOTE — Op Note (Signed)
PATIENT NAME:  Mia Padilla, Mia Padilla MR#:  [id number] DATE OF BIRTH:  Aug 02, 1934  DATE OF PROCEDURE:  08/03/2013  PREOPERATIVE DIAGNOSIS: Carcinoma of the left breast.   POSTOPERATIVE DIAGNOSIS: Carcinoma of the left breast.  OPERATION: Left total mastectomy with sentinel node biopsy.   SURGEON: Mckinley Jewel, M.D.   ANESTHESIA: General.   COMPLICATIONS: None.   ESTIMATED BLOOD LOSS: Less than 50 mL.   DRAINS: Blake drain.   DESCRIPTION OF PROCEDURE: The patient underwent nuclear contrast injection preoperatively. She was brought to the Operating Room and placed in the supine position on the operating table, then put to sleep with an LMA. The left breast and axillary regions were prepped and draped out as sterile field. She had excellent signal activity in the axilla for sentinel node identification. An elliptical skin incision was mapped out. The incision was then made. The superior flap and the lateral aspect was then elevated all the way up to the axilla, and a search made for the sentinel node. Axillary contents were fairly sparse, and a single 1 cm node was identified with intense signal activity. This was removed and sent off as sentinel node. Search for additional nodes was made, and nothing was palpable, nor with any signal activity after removal of the initial sentinel node. Following this, the superior flap was completed all the way to the infraclavicular region and inferiorly to the upper rectus fascia. The breast along with the pectoral fascia was dissected off the underlying muscle from the medial to the lateral aspect, and then removed. The lateral end was tagged for identification to Pathology. Frozen section with touch prep revealed no evidence of gross metastatic disease at the sentinel node. After ensuring hemostasis, the area was drained with a Blake drain and brought out through a stab incision inferiorly, and fastened to the skin with a nylon stitch. The subcutaneous tissue was closed  with 2 running sutures of 2-0 Vicryl, and the skin closed with 2 running subcuticular stitches of 3-0 Monocryl covered with Dermabond. Fluffs and ABDs were placed, and a surgical bra used to keep this in place. The patient subsequently was extubated and returned to the recovery room in stable condition.    ____________________________ S.Robinette Haines, MD sgs:mr D: 08/12/2013 09:32:33 ET T: 08/12/2013 19:06:53 ET JOB#: [id number]  cc: Synthia Innocent. Jamal Collin, MD, <Dictator> [facility] Robinette Haines MD ELECTRONICALLY SIGNED 08/15/2013 8:03

## 2014-08-12 NOTE — Consult Note (Signed)
PATIENT NAME:  Mia Padilla, Mia Padilla MR#:  [id number] DATE OF BIRTH:  11/24/1934  DATE OF CONSULTATION:  04/04/2011  REFERRING PHYSICIAN:  Hospitalist service CONSULTING PHYSICIAN:  Shaune Pascal. Achaia Garlock, MD  PRIMARY CARE PHYSICIAN: Dr. Golden Pop  CARDIOLOGIST: None   REASON FOR CONSULTATION: New onset atrial fibrillation.   HISTORY OF PRESENT ILLNESS: Mia Padilla is a very pleasant 80 year old woman with a history of obesity, hypertension, diabetes and chronic obstructive pulmonary disease. She denies any known history of cardiac disease. She apparently had an echocardiogram some years ago which showed an ejection fraction of 50% to 55%.   She was admitted earlier today with several day history of fevers and cough with mild sputum. She was treated as an outpatient with Zithromax but her symptoms persisted and she got more short of breath. She denied chills to me. Her daughter noticed that she has been becoming increasingly weak at home and has some mild confusion. She was brought to the Emergency Room. On admission she was afebrile. Blood pressure 135/64. She was in sinus rhythm. Her oxygen sats were 88%. Chest x-ray did not show any focal opacities. It was thought that she had either bronchitis or pneumonia, however, there is also some concern over congestive heart failure. However, BNP was checked and it was 70.   She was admitted. She did receive one dose of IV Lasix 20 mg this morning. Approximately 2:00 in the afternoon she became unresponsive. Her blood pressure was 85/50. She was seen by Dr. Royden Purl. EKG was obtained which showed new onset atrial fibrillation with rate of 67. She was treated with IV fluids and started to come around though she did have some aphasia. She underwent echocardiogram and a CT scan of the head. CT showed no evidence of acute stroke. Echocardiogram showed normal LV function. The RV was normal. There is very mild aortic stenosis.   Currently she is much more alert but still a  little bit dazed. She is able to answer just about all of my questions without problems. She is nonfocal on exam.   REVIEW OF SYSTEMS: Denies any palpitations. No previous strokes. She does have diabetes. She is relatively functional around the house with no orthopnea, PND or lower extremity edema. She has not had problems with her vision. She does have arthritis pains. No diarrhea or constipation. Remainder review of systems is all systems are negative except for history of present illness and problem list.   PROBLEM LIST:  1. Obesity.  2. Hypertension.  3. Diabetes.  4. Hyperlipidemia.  5. Chronic obstructive pulmonary disease.  6. History of seizure disorder.  7. History of monoclonal gammopathy of unknown significance.  8. Osteoporosis.  9. Glaucoma.  10. Hypothyroidism.   CURRENT MEDICATIONS:  1. Aspirin 81 a day.  2. Azithromycin. 3. Ceftriaxone. 4. Plavix. 5. Fluticasone. 6. Enoxaparin 40 a day.  7. Insulin.  8. Metformin. 9. Synthroid 0.75 mg daily. 10. Keppra 1000 mg q.12. 11. Metoprolol 100 mg a day. 12. Benicar 40 mg a day. 13. Protonix 40 a day.  14. Simvastatin 40 a day.  15. VESIcare. 16. Fosamax.   SOCIAL HISTORY: She is divorced. She lives at home alone. She has three children. She is now retired. She used to work as a Naval architect. She quit smoking in 1978. Denies alcohol or drug use.   FAMILY HISTORY: Her mother had ovarian cancer. Her sister died from a stroke. There is also a family history of diabetes.   ALLERGIES: No known drug  allergies.   PHYSICAL EXAMINATION:  GENERAL: Currently, she is in no acute distress. She is a little bit lethargic but responds to all questions appropriately.   VITAL SIGNS: Blood pressure is now 105/50, heart rate 70, fingerstick glucose was 219, it was 125 earlier today.   HEENT: Normal.   NECK: Supple and thick. No obvious JVD. Carotids are 2+ bilaterally. I do not hear any bruits. There is no lymphadenopathy or  thyromegaly.   CARDIAC: PMI is nondisplaced. She is regular. No murmurs, rubs, or gallops appreciated.   LUNGS: Diffuse rhonchi.   ABDOMEN: Obese, nontender, nondistended.   EXTREMITIES: Warm with no cyanosis, clubbing, or edema.   NEUROLOGIC: She is alert and oriented. She is able to move all extremities without difficulty. There is no facial droop. She does have minimal slurring of her speech. There is no pronator drift.   LABORATORY, DIAGNOSTIC AND RADIOLOGICAL DATA: Sodium 133, glucose 184, potassium 3.7, chloride 91, BUN 22, creatinine 0.87, BNP 70, troponin 0.03 and 0.04, white count 7.8, hemoglobin 12.2, platelets 143. EKG initially shows sinus tachycardia with incomplete right bundle branch block at 108. There left ventricular hypertrophy. No ST-T wave abnormalities. Follow up EKG shows atrial fibrillation with a rate of 67. There is left ventricular hypertrophy. No ST-T wave abnormalities.   ASSESSMENT:  1. New onset atrial fibrillation. CHADS2 score of 3.  2. Altered mental status episode-etiology unclear.  a. Head CT with no acute abnormalities.  3. History of hypertension.  4. Hyperlipidemia.  5. Diabetes.   PLAN/DISCUSSION: I am unsure of what caused her episode of altered mental status. I suspect it may be related to hypertension as I suspect she might be a little bit dry and got some Lasix this morning. However, her episode was quite profound and I cannot rule out a stroke, especially in the setting of new onset atrial fibrillation.   With regard to her atrial fibrillation this is well rate controlled. She seems relatively asymptomatic. Given her CHADS2 score I do think she should be treated with heparin with transition to Coumadin. Her echocardiogram looks fine. We can check a TSH. I do think it is reasonable to have neurology see her and potentially get an MRI to further evaluate for possible embolic event.   At this point, I would hold all her antihypertensive medications  and let her blood pressure drift up. I did order another 500 mL of fluid. Will go ahead and stop her Plavix. Will write for Coumadin. I would not start heparin at this point until she is seen by neurology and we can feel more comfortable that there was no acute embolic event.   We appreciate the consult. We will follow with you.  ____________________________ Shaune Pascal. Breland Elders, MD drb:cms D: 04/04/2011 17:06:24 ET T: 04/05/2011 07:49:18 ET JOB#: [id number]  cc: Shaune Pascal. Keaghan Staton, MD, <Dictator> Jolaine Artist MD ELECTRONICALLY SIGNED 05/19/2011 8:48

## 2014-08-13 DIAGNOSIS — I951 Orthostatic hypotension: Secondary | ICD-10-CM | POA: Diagnosis not present

## 2014-08-13 DIAGNOSIS — I4891 Unspecified atrial fibrillation: Secondary | ICD-10-CM | POA: Diagnosis not present

## 2014-08-13 DIAGNOSIS — E119 Type 2 diabetes mellitus without complications: Secondary | ICD-10-CM | POA: Diagnosis not present

## 2014-08-13 DIAGNOSIS — D510 Vitamin B12 deficiency anemia due to intrinsic factor deficiency: Secondary | ICD-10-CM | POA: Diagnosis not present

## 2014-08-15 DIAGNOSIS — I4891 Unspecified atrial fibrillation: Secondary | ICD-10-CM | POA: Diagnosis not present

## 2014-08-15 DIAGNOSIS — E119 Type 2 diabetes mellitus without complications: Secondary | ICD-10-CM | POA: Diagnosis not present

## 2014-08-15 DIAGNOSIS — D510 Vitamin B12 deficiency anemia due to intrinsic factor deficiency: Secondary | ICD-10-CM | POA: Diagnosis not present

## 2014-08-15 DIAGNOSIS — I951 Orthostatic hypotension: Secondary | ICD-10-CM | POA: Diagnosis not present

## 2014-08-17 DIAGNOSIS — D510 Vitamin B12 deficiency anemia due to intrinsic factor deficiency: Secondary | ICD-10-CM | POA: Diagnosis not present

## 2014-08-17 DIAGNOSIS — E119 Type 2 diabetes mellitus without complications: Secondary | ICD-10-CM | POA: Diagnosis not present

## 2014-08-17 DIAGNOSIS — I4891 Unspecified atrial fibrillation: Secondary | ICD-10-CM | POA: Diagnosis not present

## 2014-08-17 DIAGNOSIS — I951 Orthostatic hypotension: Secondary | ICD-10-CM | POA: Diagnosis not present

## 2014-08-20 DIAGNOSIS — E119 Type 2 diabetes mellitus without complications: Secondary | ICD-10-CM | POA: Diagnosis not present

## 2014-08-20 DIAGNOSIS — I4891 Unspecified atrial fibrillation: Secondary | ICD-10-CM | POA: Diagnosis not present

## 2014-08-20 DIAGNOSIS — D510 Vitamin B12 deficiency anemia due to intrinsic factor deficiency: Secondary | ICD-10-CM | POA: Diagnosis not present

## 2014-08-20 DIAGNOSIS — I951 Orthostatic hypotension: Secondary | ICD-10-CM | POA: Diagnosis not present

## 2014-08-22 DIAGNOSIS — D510 Vitamin B12 deficiency anemia due to intrinsic factor deficiency: Secondary | ICD-10-CM | POA: Diagnosis not present

## 2014-08-22 DIAGNOSIS — E119 Type 2 diabetes mellitus without complications: Secondary | ICD-10-CM | POA: Diagnosis not present

## 2014-08-22 DIAGNOSIS — I4891 Unspecified atrial fibrillation: Secondary | ICD-10-CM | POA: Diagnosis not present

## 2014-08-22 DIAGNOSIS — I951 Orthostatic hypotension: Secondary | ICD-10-CM | POA: Diagnosis not present

## 2014-08-24 DIAGNOSIS — E119 Type 2 diabetes mellitus without complications: Secondary | ICD-10-CM | POA: Diagnosis not present

## 2014-08-24 DIAGNOSIS — I951 Orthostatic hypotension: Secondary | ICD-10-CM | POA: Diagnosis not present

## 2014-08-24 DIAGNOSIS — I4891 Unspecified atrial fibrillation: Secondary | ICD-10-CM | POA: Diagnosis not present

## 2014-08-24 DIAGNOSIS — D510 Vitamin B12 deficiency anemia due to intrinsic factor deficiency: Secondary | ICD-10-CM | POA: Diagnosis not present

## 2014-08-28 DIAGNOSIS — E119 Type 2 diabetes mellitus without complications: Secondary | ICD-10-CM | POA: Diagnosis not present

## 2014-08-28 DIAGNOSIS — D510 Vitamin B12 deficiency anemia due to intrinsic factor deficiency: Secondary | ICD-10-CM | POA: Diagnosis not present

## 2014-08-28 DIAGNOSIS — I4891 Unspecified atrial fibrillation: Secondary | ICD-10-CM | POA: Diagnosis not present

## 2014-08-28 DIAGNOSIS — I951 Orthostatic hypotension: Secondary | ICD-10-CM | POA: Diagnosis not present

## 2014-08-29 DIAGNOSIS — E119 Type 2 diabetes mellitus without complications: Secondary | ICD-10-CM | POA: Diagnosis not present

## 2014-08-29 DIAGNOSIS — I4891 Unspecified atrial fibrillation: Secondary | ICD-10-CM | POA: Diagnosis not present

## 2014-08-29 DIAGNOSIS — D510 Vitamin B12 deficiency anemia due to intrinsic factor deficiency: Secondary | ICD-10-CM | POA: Diagnosis not present

## 2014-08-29 DIAGNOSIS — I951 Orthostatic hypotension: Secondary | ICD-10-CM | POA: Diagnosis not present

## 2014-08-31 DIAGNOSIS — E119 Type 2 diabetes mellitus without complications: Secondary | ICD-10-CM | POA: Diagnosis not present

## 2014-08-31 DIAGNOSIS — I4891 Unspecified atrial fibrillation: Secondary | ICD-10-CM | POA: Diagnosis not present

## 2014-08-31 DIAGNOSIS — D510 Vitamin B12 deficiency anemia due to intrinsic factor deficiency: Secondary | ICD-10-CM | POA: Diagnosis not present

## 2014-08-31 DIAGNOSIS — I951 Orthostatic hypotension: Secondary | ICD-10-CM | POA: Diagnosis not present

## 2014-09-03 DIAGNOSIS — I4891 Unspecified atrial fibrillation: Secondary | ICD-10-CM | POA: Diagnosis not present

## 2014-09-03 DIAGNOSIS — I951 Orthostatic hypotension: Secondary | ICD-10-CM | POA: Diagnosis not present

## 2014-09-03 DIAGNOSIS — E119 Type 2 diabetes mellitus without complications: Secondary | ICD-10-CM | POA: Diagnosis not present

## 2014-09-03 DIAGNOSIS — D510 Vitamin B12 deficiency anemia due to intrinsic factor deficiency: Secondary | ICD-10-CM | POA: Diagnosis not present

## 2014-09-05 DIAGNOSIS — I4891 Unspecified atrial fibrillation: Secondary | ICD-10-CM | POA: Diagnosis not present

## 2014-09-05 DIAGNOSIS — E119 Type 2 diabetes mellitus without complications: Secondary | ICD-10-CM | POA: Diagnosis not present

## 2014-09-05 DIAGNOSIS — I951 Orthostatic hypotension: Secondary | ICD-10-CM | POA: Diagnosis not present

## 2014-09-05 DIAGNOSIS — D510 Vitamin B12 deficiency anemia due to intrinsic factor deficiency: Secondary | ICD-10-CM | POA: Diagnosis not present

## 2014-09-07 DIAGNOSIS — E119 Type 2 diabetes mellitus without complications: Secondary | ICD-10-CM | POA: Diagnosis not present

## 2014-09-07 DIAGNOSIS — I951 Orthostatic hypotension: Secondary | ICD-10-CM | POA: Diagnosis not present

## 2014-09-07 DIAGNOSIS — D510 Vitamin B12 deficiency anemia due to intrinsic factor deficiency: Secondary | ICD-10-CM | POA: Diagnosis not present

## 2014-09-07 DIAGNOSIS — I4891 Unspecified atrial fibrillation: Secondary | ICD-10-CM | POA: Diagnosis not present

## 2014-09-10 DIAGNOSIS — I951 Orthostatic hypotension: Secondary | ICD-10-CM | POA: Diagnosis not present

## 2014-09-10 DIAGNOSIS — E119 Type 2 diabetes mellitus without complications: Secondary | ICD-10-CM | POA: Diagnosis not present

## 2014-09-10 DIAGNOSIS — D510 Vitamin B12 deficiency anemia due to intrinsic factor deficiency: Secondary | ICD-10-CM | POA: Diagnosis not present

## 2014-09-10 DIAGNOSIS — I4891 Unspecified atrial fibrillation: Secondary | ICD-10-CM | POA: Diagnosis not present

## 2014-09-12 DIAGNOSIS — I4891 Unspecified atrial fibrillation: Secondary | ICD-10-CM | POA: Diagnosis not present

## 2014-09-12 DIAGNOSIS — E119 Type 2 diabetes mellitus without complications: Secondary | ICD-10-CM | POA: Diagnosis not present

## 2014-09-12 DIAGNOSIS — I951 Orthostatic hypotension: Secondary | ICD-10-CM | POA: Diagnosis not present

## 2014-09-12 DIAGNOSIS — D510 Vitamin B12 deficiency anemia due to intrinsic factor deficiency: Secondary | ICD-10-CM | POA: Diagnosis not present

## 2014-09-13 DIAGNOSIS — E119 Type 2 diabetes mellitus without complications: Secondary | ICD-10-CM | POA: Diagnosis not present

## 2014-09-13 DIAGNOSIS — I951 Orthostatic hypotension: Secondary | ICD-10-CM | POA: Diagnosis not present

## 2014-09-13 DIAGNOSIS — D510 Vitamin B12 deficiency anemia due to intrinsic factor deficiency: Secondary | ICD-10-CM | POA: Diagnosis not present

## 2014-09-13 DIAGNOSIS — I4891 Unspecified atrial fibrillation: Secondary | ICD-10-CM | POA: Diagnosis not present

## 2014-09-18 DIAGNOSIS — I6529 Occlusion and stenosis of unspecified carotid artery: Secondary | ICD-10-CM | POA: Diagnosis not present

## 2014-09-18 DIAGNOSIS — M7989 Other specified soft tissue disorders: Secondary | ICD-10-CM | POA: Diagnosis not present

## 2014-09-24 DIAGNOSIS — E119 Type 2 diabetes mellitus without complications: Secondary | ICD-10-CM | POA: Diagnosis not present

## 2014-09-24 DIAGNOSIS — D510 Vitamin B12 deficiency anemia due to intrinsic factor deficiency: Secondary | ICD-10-CM | POA: Diagnosis not present

## 2014-09-24 DIAGNOSIS — I951 Orthostatic hypotension: Secondary | ICD-10-CM | POA: Diagnosis not present

## 2014-09-24 DIAGNOSIS — I4891 Unspecified atrial fibrillation: Secondary | ICD-10-CM | POA: Diagnosis not present

## 2014-09-26 DIAGNOSIS — I4891 Unspecified atrial fibrillation: Secondary | ICD-10-CM | POA: Diagnosis not present

## 2014-09-26 DIAGNOSIS — E119 Type 2 diabetes mellitus without complications: Secondary | ICD-10-CM | POA: Diagnosis not present

## 2014-09-26 DIAGNOSIS — D510 Vitamin B12 deficiency anemia due to intrinsic factor deficiency: Secondary | ICD-10-CM | POA: Diagnosis not present

## 2014-09-26 DIAGNOSIS — I951 Orthostatic hypotension: Secondary | ICD-10-CM | POA: Diagnosis not present

## 2014-09-28 DIAGNOSIS — D510 Vitamin B12 deficiency anemia due to intrinsic factor deficiency: Secondary | ICD-10-CM | POA: Diagnosis not present

## 2014-09-28 DIAGNOSIS — I951 Orthostatic hypotension: Secondary | ICD-10-CM | POA: Diagnosis not present

## 2014-09-28 DIAGNOSIS — I4891 Unspecified atrial fibrillation: Secondary | ICD-10-CM | POA: Diagnosis not present

## 2014-09-28 DIAGNOSIS — E119 Type 2 diabetes mellitus without complications: Secondary | ICD-10-CM | POA: Diagnosis not present

## 2014-10-01 DIAGNOSIS — I4891 Unspecified atrial fibrillation: Secondary | ICD-10-CM | POA: Diagnosis not present

## 2014-10-01 DIAGNOSIS — I951 Orthostatic hypotension: Secondary | ICD-10-CM | POA: Diagnosis not present

## 2014-10-01 DIAGNOSIS — D510 Vitamin B12 deficiency anemia due to intrinsic factor deficiency: Secondary | ICD-10-CM | POA: Diagnosis not present

## 2014-10-01 DIAGNOSIS — E119 Type 2 diabetes mellitus without complications: Secondary | ICD-10-CM | POA: Diagnosis not present

## 2014-10-03 DIAGNOSIS — E119 Type 2 diabetes mellitus without complications: Secondary | ICD-10-CM | POA: Diagnosis not present

## 2014-10-03 DIAGNOSIS — I4891 Unspecified atrial fibrillation: Secondary | ICD-10-CM | POA: Diagnosis not present

## 2014-10-03 DIAGNOSIS — I951 Orthostatic hypotension: Secondary | ICD-10-CM | POA: Diagnosis not present

## 2014-10-03 DIAGNOSIS — D510 Vitamin B12 deficiency anemia due to intrinsic factor deficiency: Secondary | ICD-10-CM | POA: Diagnosis not present

## 2014-10-05 DIAGNOSIS — I4891 Unspecified atrial fibrillation: Secondary | ICD-10-CM | POA: Diagnosis not present

## 2014-10-05 DIAGNOSIS — E119 Type 2 diabetes mellitus without complications: Secondary | ICD-10-CM | POA: Diagnosis not present

## 2014-10-05 DIAGNOSIS — I951 Orthostatic hypotension: Secondary | ICD-10-CM | POA: Diagnosis not present

## 2014-10-05 DIAGNOSIS — D510 Vitamin B12 deficiency anemia due to intrinsic factor deficiency: Secondary | ICD-10-CM | POA: Diagnosis not present

## 2014-10-08 DIAGNOSIS — D510 Vitamin B12 deficiency anemia due to intrinsic factor deficiency: Secondary | ICD-10-CM | POA: Diagnosis not present

## 2014-10-08 DIAGNOSIS — I4891 Unspecified atrial fibrillation: Secondary | ICD-10-CM | POA: Diagnosis not present

## 2014-10-08 DIAGNOSIS — E119 Type 2 diabetes mellitus without complications: Secondary | ICD-10-CM | POA: Diagnosis not present

## 2014-10-08 DIAGNOSIS — I951 Orthostatic hypotension: Secondary | ICD-10-CM | POA: Diagnosis not present

## 2014-10-10 DIAGNOSIS — D510 Vitamin B12 deficiency anemia due to intrinsic factor deficiency: Secondary | ICD-10-CM | POA: Diagnosis not present

## 2014-10-10 DIAGNOSIS — E119 Type 2 diabetes mellitus without complications: Secondary | ICD-10-CM | POA: Diagnosis not present

## 2014-10-10 DIAGNOSIS — I951 Orthostatic hypotension: Secondary | ICD-10-CM | POA: Diagnosis not present

## 2014-10-10 DIAGNOSIS — I4891 Unspecified atrial fibrillation: Secondary | ICD-10-CM | POA: Diagnosis not present

## 2014-10-12 DIAGNOSIS — I951 Orthostatic hypotension: Secondary | ICD-10-CM | POA: Diagnosis not present

## 2014-10-12 DIAGNOSIS — E119 Type 2 diabetes mellitus without complications: Secondary | ICD-10-CM | POA: Diagnosis not present

## 2014-10-12 DIAGNOSIS — D510 Vitamin B12 deficiency anemia due to intrinsic factor deficiency: Secondary | ICD-10-CM | POA: Diagnosis not present

## 2014-10-12 DIAGNOSIS — I4891 Unspecified atrial fibrillation: Secondary | ICD-10-CM | POA: Diagnosis not present

## 2014-10-15 DIAGNOSIS — I4891 Unspecified atrial fibrillation: Secondary | ICD-10-CM | POA: Diagnosis not present

## 2014-10-15 DIAGNOSIS — I951 Orthostatic hypotension: Secondary | ICD-10-CM | POA: Diagnosis not present

## 2014-10-15 DIAGNOSIS — D510 Vitamin B12 deficiency anemia due to intrinsic factor deficiency: Secondary | ICD-10-CM | POA: Diagnosis not present

## 2014-10-15 DIAGNOSIS — E119 Type 2 diabetes mellitus without complications: Secondary | ICD-10-CM | POA: Diagnosis not present

## 2014-10-17 DIAGNOSIS — D510 Vitamin B12 deficiency anemia due to intrinsic factor deficiency: Secondary | ICD-10-CM | POA: Diagnosis not present

## 2014-10-17 DIAGNOSIS — I4891 Unspecified atrial fibrillation: Secondary | ICD-10-CM | POA: Diagnosis not present

## 2014-10-17 DIAGNOSIS — E119 Type 2 diabetes mellitus without complications: Secondary | ICD-10-CM | POA: Diagnosis not present

## 2014-10-17 DIAGNOSIS — I951 Orthostatic hypotension: Secondary | ICD-10-CM | POA: Diagnosis not present

## 2014-10-19 DIAGNOSIS — D510 Vitamin B12 deficiency anemia due to intrinsic factor deficiency: Secondary | ICD-10-CM | POA: Diagnosis not present

## 2014-10-19 DIAGNOSIS — I4891 Unspecified atrial fibrillation: Secondary | ICD-10-CM | POA: Diagnosis not present

## 2014-10-19 DIAGNOSIS — E119 Type 2 diabetes mellitus without complications: Secondary | ICD-10-CM | POA: Diagnosis not present

## 2014-10-19 DIAGNOSIS — I951 Orthostatic hypotension: Secondary | ICD-10-CM | POA: Diagnosis not present

## 2014-10-22 DIAGNOSIS — E119 Type 2 diabetes mellitus without complications: Secondary | ICD-10-CM | POA: Diagnosis not present

## 2014-10-22 DIAGNOSIS — D510 Vitamin B12 deficiency anemia due to intrinsic factor deficiency: Secondary | ICD-10-CM | POA: Diagnosis not present

## 2014-10-22 DIAGNOSIS — I4891 Unspecified atrial fibrillation: Secondary | ICD-10-CM | POA: Diagnosis not present

## 2014-10-22 DIAGNOSIS — I951 Orthostatic hypotension: Secondary | ICD-10-CM | POA: Diagnosis not present

## 2014-10-24 DIAGNOSIS — E119 Type 2 diabetes mellitus without complications: Secondary | ICD-10-CM | POA: Diagnosis not present

## 2014-10-24 DIAGNOSIS — D510 Vitamin B12 deficiency anemia due to intrinsic factor deficiency: Secondary | ICD-10-CM | POA: Diagnosis not present

## 2014-10-24 DIAGNOSIS — I4891 Unspecified atrial fibrillation: Secondary | ICD-10-CM | POA: Diagnosis not present

## 2014-10-24 DIAGNOSIS — I951 Orthostatic hypotension: Secondary | ICD-10-CM | POA: Diagnosis not present

## 2014-10-26 DIAGNOSIS — I951 Orthostatic hypotension: Secondary | ICD-10-CM | POA: Diagnosis not present

## 2014-10-26 DIAGNOSIS — I4891 Unspecified atrial fibrillation: Secondary | ICD-10-CM | POA: Diagnosis not present

## 2014-10-26 DIAGNOSIS — D510 Vitamin B12 deficiency anemia due to intrinsic factor deficiency: Secondary | ICD-10-CM | POA: Diagnosis not present

## 2014-10-26 DIAGNOSIS — E119 Type 2 diabetes mellitus without complications: Secondary | ICD-10-CM | POA: Diagnosis not present

## 2014-10-29 DIAGNOSIS — E119 Type 2 diabetes mellitus without complications: Secondary | ICD-10-CM | POA: Diagnosis not present

## 2014-10-29 DIAGNOSIS — I4891 Unspecified atrial fibrillation: Secondary | ICD-10-CM | POA: Diagnosis not present

## 2014-10-29 DIAGNOSIS — I951 Orthostatic hypotension: Secondary | ICD-10-CM | POA: Diagnosis not present

## 2014-10-29 DIAGNOSIS — D510 Vitamin B12 deficiency anemia due to intrinsic factor deficiency: Secondary | ICD-10-CM | POA: Diagnosis not present

## 2014-10-30 DIAGNOSIS — E119 Type 2 diabetes mellitus without complications: Secondary | ICD-10-CM | POA: Diagnosis not present

## 2014-10-30 DIAGNOSIS — B351 Tinea unguium: Secondary | ICD-10-CM | POA: Diagnosis not present

## 2014-10-31 ENCOUNTER — Telehealth: Payer: Self-pay | Admitting: Family Medicine

## 2014-10-31 DIAGNOSIS — E119 Type 2 diabetes mellitus without complications: Secondary | ICD-10-CM | POA: Diagnosis not present

## 2014-10-31 DIAGNOSIS — I4891 Unspecified atrial fibrillation: Secondary | ICD-10-CM | POA: Diagnosis not present

## 2014-10-31 DIAGNOSIS — I951 Orthostatic hypotension: Secondary | ICD-10-CM | POA: Diagnosis not present

## 2014-10-31 DIAGNOSIS — D510 Vitamin B12 deficiency anemia due to intrinsic factor deficiency: Secondary | ICD-10-CM | POA: Diagnosis not present

## 2014-10-31 NOTE — Telephone Encounter (Signed)
I have not received any faxes, patient has appointment 11/01/14.  We can get more information at that time.

## 2014-10-31 NOTE — Telephone Encounter (Signed)
Pt called stated her pharmacy has faxed a form for her test strips to be refilled, also needs a new meter. Please contact [place] @ [phone]. Pt stated the pharmacy said they faxed a form that has not been returned to fill the RX request. Thanks.

## 2014-11-01 ENCOUNTER — Encounter: Payer: Self-pay | Admitting: Family Medicine

## 2014-11-01 ENCOUNTER — Ambulatory Visit (INDEPENDENT_AMBULATORY_CARE_PROVIDER_SITE_OTHER): Payer: Medicare Other | Admitting: Family Medicine

## 2014-11-01 VITALS — BP 154/64 | HR 79 | Temp 98.3°F | Ht 62.6 in | Wt 197.4 lb

## 2014-11-01 DIAGNOSIS — I10 Essential (primary) hypertension: Secondary | ICD-10-CM

## 2014-11-01 DIAGNOSIS — I482 Chronic atrial fibrillation, unspecified: Secondary | ICD-10-CM

## 2014-11-01 DIAGNOSIS — M8580 Other specified disorders of bone density and structure, unspecified site: Secondary | ICD-10-CM

## 2014-11-01 DIAGNOSIS — Z0000 Encounter for general adult medical examination without abnormal findings: Secondary | ICD-10-CM | POA: Diagnosis not present

## 2014-11-01 DIAGNOSIS — E119 Type 2 diabetes mellitus without complications: Secondary | ICD-10-CM | POA: Diagnosis not present

## 2014-11-01 DIAGNOSIS — E039 Hypothyroidism, unspecified: Secondary | ICD-10-CM

## 2014-11-01 DIAGNOSIS — E785 Hyperlipidemia, unspecified: Secondary | ICD-10-CM | POA: Diagnosis not present

## 2014-11-01 DIAGNOSIS — N343 Urethral syndrome, unspecified: Secondary | ICD-10-CM

## 2014-11-01 NOTE — Assessment & Plan Note (Signed)
The current medical regimen is effective;  continue present plan and medications.  

## 2014-11-01 NOTE — Progress Notes (Addendum)
BP 154/64 mmHg  Pulse 79  Temp(Src) 98.3 F (36.8 C)  Ht 5' 2.6" (1.59 m)  Wt 197 lb 6.4 oz (89.54 kg)  BMI 35.42 kg/m2  SpO2 96%   Subjective:    Patient ID: Mia Padilla, female    DOB: 1934/07/18, 80 y.o.   MRN: [id number]  HPI: Mia Padilla is a 80 y.o. female  Chief Complaint  Patient presents with  . Medicare Wellness    Relevant past medical, surgical, family and social history reviewed and updated as indicated. Interim medical history since our last visit reviewed. Allergies and medications reviewed and updated.  Review of Systems  Constitutional: Negative.   HENT: Negative.   Eyes: Negative.   Respiratory: Negative.   Cardiovascular: Negative.   Gastrointestinal: Negative.   Endocrine: Negative.   Genitourinary: Negative.   Musculoskeletal: Negative.   Skin: Negative.   Allergic/Immunologic: Negative.   Neurological: Negative.   Hematological: Negative.   Psychiatric/Behavioral: Negative.     Per HPI unless specifically indicated above     Objective:    BP 154/64 mmHg  Pulse 79  Temp(Src) 98.3 F (36.8 C)  Ht 5' 2.6" (1.59 m)  Wt 197 lb 6.4 oz (89.54 kg)  BMI 35.42 kg/m2  SpO2 96%  Wt Readings from Last 3 Encounters:  11/01/14 197 lb 6.4 oz (89.54 kg)  08/02/14 206 lb (93.441 kg)  06/14/14 209 lb 12 oz (95.142 kg)    Physical Exam  Constitutional: She is oriented to person, place, and time. She appears well-developed and well-nourished.  HENT:  Head: Normocephalic and atraumatic.  Right Ear: External ear normal.  Left Ear: External ear normal.  Nose: Nose normal.  Mouth/Throat: Oropharynx is clear and moist.  Eyes: Conjunctivae and EOM are normal. Pupils are equal, round, and reactive to light.  Neck: Normal range of motion. Neck supple. Carotid bruit is not present.  Cardiovascular: Normal rate, regular rhythm and normal heart sounds.   No murmur heard. Pulmonary/Chest: Effort normal and breath sounds normal. Right breast exhibits no  mass and no tenderness. Left breast exhibits no mass and no tenderness. Breasts are symmetrical.  Abdominal: Soft. Bowel sounds are normal. There is no hepatosplenomegaly.  Musculoskeletal: Normal range of motion.  Neurological: She is alert and oriented to person, place, and time.  Skin: No rash noted.  Psychiatric: She has a normal mood and affect. Her behavior is normal. Judgment and thought content normal.        Problem List Items Addressed This Visit      Cardiovascular and Mediastinum   A-fib    The current medical regimen is effective;  continue present plan and medications.       Relevant Orders   CBC w/Diff   Hypertension    The current medical regimen is effective;  continue present plan and medications.         Endocrine   Diabetes mellitus - Primary    The current medical regimen is effective;  continue present plan and medications.       Relevant Orders   Bayer DCA Hb A1c Waived (Completed)   Comp Met (CMET)    Other Visit Diagnoses    Annual physical exam        Relevant Orders    CBC w/Diff    Comp Met (CMET)    Lipid Profile    TSH    Urinalysis, Routine w reflex microscopic (not at [facility]) (Completed)    Hypothyroidism, unspecified hypothyroidism type  Relevant Orders    Comp Met (CMET)    TSH    Urinalysis, Routine w reflex microscopic (not at [facility]) (Completed)    Essential hypertension, benign        Relevant Orders    Comp Met (CMET)    Hyperlipidemia        Relevant Orders    Lipid Profile    Osteopenia        Relevant Orders    TSH    Dysuria-frequency syndrome        check c and s    Relevant Orders    Urine Culture    Microscopic Examination (Completed)    Routine history and physical examination of adult            Noted dramatically altered due to unknown epic requirements

## 2014-11-01 NOTE — Progress Notes (Signed)
   BP 154/64 mmHg  Pulse 79  Temp(Src) 98.3 F (36.8 C)  Ht 5' 2.6" (1.59 m)  Wt 197 lb 6.4 oz (89.54 kg)  BMI 35.42 kg/m2  SpO2 96%   Subjective:    Patient ID: Mia Padilla, female    DOB: [birth year], 80 y.o.   MRN: [id number]  HPI: Mia Padilla is a 80 y.o. female  Chief Complaint  Patient presents with  . Medicare Wellness     Relevant past medical, surgical, family and social history reviewed and updated as indicated. Interim medical history since our last visit reviewed. Allergies and medications reviewed and updated.  Review of Systems  Per HPI unless specifically indicated above     Objective:    BP 154/64 mmHg  Pulse 79  Temp(Src) 98.3 F (36.8 C)  Ht 5' 2.6" (1.59 m)  Wt 197 lb 6.4 oz (89.54 kg)  BMI 35.42 kg/m2  SpO2 96%  Wt Readings from Last 3 Encounters:  11/01/14 197 lb 6.4 oz (89.54 kg)  08/02/14 206 lb (93.441 kg)  06/14/14 209 lb 12 oz (95.142 kg)    Physical Exam      Assessment & Plan:   Problem List Items Addressed This Visit      Cardiovascular and Mediastinum   A-fib   Relevant Orders   CBC w/Diff     Endocrine   Diabetes mellitus - Primary   Relevant Orders   Bayer DCA Hb A1c Waived (Completed)   Comp Met (CMET)    Other Visit Diagnoses    Annual physical exam        Relevant Orders    CBC w/Diff    Comp Met (CMET)    Lipid Profile    TSH    Urinalysis, Routine w reflex microscopic (not at [facility]) (Completed)    Hypothyroidism, unspecified hypothyroidism type        Relevant Orders    Comp Met (CMET)    TSH    Urinalysis, Routine w reflex microscopic (not at [facility]) (Completed)    Essential hypertension, benign        Relevant Orders    Comp Met (CMET)    Hyperlipidemia        Relevant Orders    Lipid Profile    Osteopenia        Relevant Orders    TSH    Dysuria-frequency syndrome        Relevant Orders    Urine Culture        Follow up plan: No Follow-up on file.

## 2014-11-02 DIAGNOSIS — I951 Orthostatic hypotension: Secondary | ICD-10-CM | POA: Diagnosis not present

## 2014-11-02 DIAGNOSIS — E119 Type 2 diabetes mellitus without complications: Secondary | ICD-10-CM | POA: Diagnosis not present

## 2014-11-02 DIAGNOSIS — I4891 Unspecified atrial fibrillation: Secondary | ICD-10-CM | POA: Diagnosis not present

## 2014-11-02 DIAGNOSIS — D510 Vitamin B12 deficiency anemia due to intrinsic factor deficiency: Secondary | ICD-10-CM | POA: Diagnosis not present

## 2014-11-02 LAB — LIPID PANEL
Chol/HDL Ratio: 3.6 ratio units (ref 0.0–4.4)
Cholesterol, Total: 158 mg/dL (ref 100–199)
HDL: 44 mg/dL (ref >39)
LDL CALC: 97 mg/dL (ref 0–99)
Triglycerides: 85 mg/dL (ref 0–149)
VLDL CHOLESTEROL CAL: 17 mg/dL (ref 5–40)

## 2014-11-02 LAB — CBC WITH DIFFERENTIAL/PLATELET
BASOS: 0 %
Basophils Absolute: 0 10*3/uL (ref 0.0–0.2)
EOS (ABSOLUTE): 0.2 10*3/uL (ref 0.0–0.4)
Eos: 3 %
HEMOGLOBIN: 11.8 g/dL (ref 11.1–15.9)
Hematocrit: 36.8 % (ref 34.0–46.6)
Immature Grans (Abs): 0 10*3/uL (ref 0.0–0.1)
Immature Granulocytes: 0 %
LYMPHS ABS: 1.1 10*3/uL (ref 0.7–3.1)
Lymphs: 19 %
MCH: 29 pg (ref 26.6–33.0)
MCHC: 32.1 g/dL (ref 31.5–35.7)
MCV: 90 fL (ref 79–97)
MONOCYTES: 5 %
Monocytes Absolute: 0.3 10*3/uL (ref 0.1–0.9)
NEUTROS ABS: 4.1 10*3/uL (ref 1.4–7.0)
Neutrophils: 73 %
PLATELETS: 238 10*3/uL (ref 150–379)
RBC: 4.07 x10E6/uL (ref 3.77–5.28)
RDW: 15.5 % — AB (ref 12.3–15.4)
WBC: 5.6 10*3/uL (ref 3.4–10.8)

## 2014-11-02 LAB — COMPREHENSIVE METABOLIC PANEL
ALT: 8 IU/L (ref 0–32)
AST: 17 IU/L (ref 0–40)
Albumin/Globulin Ratio: 0.9 — ABNORMAL LOW (ref 1.1–2.5)
Albumin: 4.1 g/dL (ref 3.5–4.8)
Alkaline Phosphatase: 66 IU/L (ref 39–117)
BILIRUBIN TOTAL: 0.5 mg/dL (ref 0.0–1.2)
BUN / CREAT RATIO: 16 (ref 11–26)
BUN: 12 mg/dL (ref 8–27)
CALCIUM: 9.6 mg/dL (ref 8.7–10.3)
CO2: 26 mmol/L (ref 18–29)
Chloride: 100 mmol/L (ref 97–108)
Creatinine, Ser: 0.75 mg/dL (ref 0.57–1.00)
GFR calc non Af Amer: 77 mL/min/{1.73_m2} (ref >59)
GFR, EST AFRICAN AMERICAN: 89 mL/min/{1.73_m2} (ref >59)
Globulin, Total: 4.4 g/dL (ref 1.5–4.5)
Glucose: 108 mg/dL — ABNORMAL HIGH (ref 65–99)
Potassium: 4.3 mmol/L (ref 3.5–5.2)
Sodium: 142 mmol/L (ref 134–144)
Total Protein: 8.5 g/dL (ref 6.0–8.5)

## 2014-11-02 LAB — URINALYSIS, ROUTINE W REFLEX MICROSCOPIC
BILIRUBIN UA: NEGATIVE
Ketones, UA: NEGATIVE
Nitrite, UA: POSITIVE — AB
PROTEIN UA: NEGATIVE
Specific Gravity, UA: 1.015 (ref 1.005–1.030)
Urobilinogen, Ur: 0.2 mg/dL (ref 0.2–1.0)
pH, UA: 5 (ref 5.0–7.5)

## 2014-11-02 LAB — TSH: TSH: 6.64 u[IU]/mL — ABNORMAL HIGH (ref 0.450–4.500)

## 2014-11-02 LAB — MICROSCOPIC EXAMINATION

## 2014-11-02 LAB — BAYER DCA HB A1C WAIVED: HB A1C (BAYER DCA - WAIVED): 7.2 % — ABNORMAL HIGH (ref <7.0)

## 2014-11-05 ENCOUNTER — Telehealth: Payer: Self-pay | Admitting: Family Medicine

## 2014-11-05 DIAGNOSIS — H2513 Age-related nuclear cataract, bilateral: Secondary | ICD-10-CM | POA: Diagnosis not present

## 2014-11-05 DIAGNOSIS — E119 Type 2 diabetes mellitus without complications: Secondary | ICD-10-CM | POA: Diagnosis not present

## 2014-11-05 DIAGNOSIS — H4011X3 Primary open-angle glaucoma, severe stage: Secondary | ICD-10-CM | POA: Diagnosis not present

## 2014-11-05 DIAGNOSIS — I951 Orthostatic hypotension: Secondary | ICD-10-CM | POA: Diagnosis not present

## 2014-11-05 DIAGNOSIS — I4891 Unspecified atrial fibrillation: Secondary | ICD-10-CM | POA: Diagnosis not present

## 2014-11-05 DIAGNOSIS — D510 Vitamin B12 deficiency anemia due to intrinsic factor deficiency: Secondary | ICD-10-CM | POA: Diagnosis not present

## 2014-11-05 LAB — URINE CULTURE

## 2014-11-05 MED ORDER — GLUCOSE BLOOD VI STRP: ORAL_STRIP | Status: AC

## 2014-11-05 NOTE — Telephone Encounter (Signed)
Test strips

## 2014-11-06 MED ORDER — CYANOCOBALAMIN 1000 MCG/ML IJ SOLN: 1000.0000 ug | INTRAMUSCULAR | Status: DC

## 2014-11-06 MED ORDER — CIPROFLOXACIN HCL 250 MG PO TABS: 250.0000 mg | ORAL_TABLET | Freq: Two times a day (BID) | ORAL | Status: DC

## 2014-11-07 DIAGNOSIS — I4891 Unspecified atrial fibrillation: Secondary | ICD-10-CM | POA: Diagnosis not present

## 2014-11-07 DIAGNOSIS — E119 Type 2 diabetes mellitus without complications: Secondary | ICD-10-CM | POA: Diagnosis not present

## 2014-11-07 DIAGNOSIS — D510 Vitamin B12 deficiency anemia due to intrinsic factor deficiency: Secondary | ICD-10-CM | POA: Diagnosis not present

## 2014-11-07 DIAGNOSIS — I951 Orthostatic hypotension: Secondary | ICD-10-CM | POA: Diagnosis not present

## 2014-11-09 ENCOUNTER — Telehealth: Payer: Self-pay | Admitting: Family Medicine

## 2014-11-09 DIAGNOSIS — D510 Vitamin B12 deficiency anemia due to intrinsic factor deficiency: Secondary | ICD-10-CM | POA: Diagnosis not present

## 2014-11-09 DIAGNOSIS — E119 Type 2 diabetes mellitus without complications: Secondary | ICD-10-CM | POA: Diagnosis not present

## 2014-11-09 DIAGNOSIS — I4891 Unspecified atrial fibrillation: Secondary | ICD-10-CM | POA: Diagnosis not present

## 2014-11-09 DIAGNOSIS — I951 Orthostatic hypotension: Secondary | ICD-10-CM | POA: Diagnosis not present

## 2014-11-09 NOTE — Telephone Encounter (Signed)
Need verbal ok

## 2014-11-09 NOTE — Telephone Encounter (Signed)
Debbie from [place] called and stated she needs a verbal order for B12 injections once a month for 2 months, as well as for the home health aide to come and see the pt 3 times a week for 9 weeks. Please return call ASAP. Thanks.

## 2014-11-12 DIAGNOSIS — I951 Orthostatic hypotension: Secondary | ICD-10-CM | POA: Diagnosis not present

## 2014-11-12 DIAGNOSIS — I4891 Unspecified atrial fibrillation: Secondary | ICD-10-CM | POA: Diagnosis not present

## 2014-11-12 DIAGNOSIS — D510 Vitamin B12 deficiency anemia due to intrinsic factor deficiency: Secondary | ICD-10-CM | POA: Diagnosis not present

## 2014-11-12 DIAGNOSIS — E119 Type 2 diabetes mellitus without complications: Secondary | ICD-10-CM | POA: Diagnosis not present

## 2014-11-12 NOTE — Telephone Encounter (Signed)
Verbal order given  

## 2014-11-12 NOTE — Telephone Encounter (Signed)
Please give B12 injections once a month for 2 months, as well as for the home health aide to come and see the pt 3 times a week for 9 weeks.

## 2014-11-13 DIAGNOSIS — E119 Type 2 diabetes mellitus without complications: Secondary | ICD-10-CM | POA: Diagnosis not present

## 2014-11-13 DIAGNOSIS — I951 Orthostatic hypotension: Secondary | ICD-10-CM | POA: Diagnosis not present

## 2014-11-13 DIAGNOSIS — I4891 Unspecified atrial fibrillation: Secondary | ICD-10-CM | POA: Diagnosis not present

## 2014-11-13 DIAGNOSIS — D510 Vitamin B12 deficiency anemia due to intrinsic factor deficiency: Secondary | ICD-10-CM | POA: Diagnosis not present

## 2014-11-14 DIAGNOSIS — E119 Type 2 diabetes mellitus without complications: Secondary | ICD-10-CM | POA: Diagnosis not present

## 2014-11-14 DIAGNOSIS — I951 Orthostatic hypotension: Secondary | ICD-10-CM | POA: Diagnosis not present

## 2014-11-14 DIAGNOSIS — I4891 Unspecified atrial fibrillation: Secondary | ICD-10-CM | POA: Diagnosis not present

## 2014-11-14 DIAGNOSIS — D510 Vitamin B12 deficiency anemia due to intrinsic factor deficiency: Secondary | ICD-10-CM | POA: Diagnosis not present

## 2014-11-16 DIAGNOSIS — I4891 Unspecified atrial fibrillation: Secondary | ICD-10-CM | POA: Diagnosis not present

## 2014-11-16 DIAGNOSIS — E119 Type 2 diabetes mellitus without complications: Secondary | ICD-10-CM | POA: Diagnosis not present

## 2014-11-16 DIAGNOSIS — D510 Vitamin B12 deficiency anemia due to intrinsic factor deficiency: Secondary | ICD-10-CM | POA: Diagnosis not present

## 2014-11-16 DIAGNOSIS — I951 Orthostatic hypotension: Secondary | ICD-10-CM | POA: Diagnosis not present

## 2014-11-19 DIAGNOSIS — I4891 Unspecified atrial fibrillation: Secondary | ICD-10-CM | POA: Diagnosis not present

## 2014-11-19 DIAGNOSIS — D510 Vitamin B12 deficiency anemia due to intrinsic factor deficiency: Secondary | ICD-10-CM | POA: Diagnosis not present

## 2014-11-19 DIAGNOSIS — I951 Orthostatic hypotension: Secondary | ICD-10-CM | POA: Diagnosis not present

## 2014-11-19 DIAGNOSIS — E119 Type 2 diabetes mellitus without complications: Secondary | ICD-10-CM | POA: Diagnosis not present

## 2014-11-21 ENCOUNTER — Telehealth: Payer: Self-pay | Admitting: Family Medicine

## 2014-11-21 DIAGNOSIS — I4891 Unspecified atrial fibrillation: Secondary | ICD-10-CM | POA: Diagnosis not present

## 2014-11-21 DIAGNOSIS — E119 Type 2 diabetes mellitus without complications: Secondary | ICD-10-CM

## 2014-11-21 DIAGNOSIS — I951 Orthostatic hypotension: Secondary | ICD-10-CM | POA: Diagnosis not present

## 2014-11-21 DIAGNOSIS — D510 Vitamin B12 deficiency anemia due to intrinsic factor deficiency: Secondary | ICD-10-CM | POA: Diagnosis not present

## 2014-11-21 MED ORDER — BLOOD GLUCOSE METER KIT: PACK | Status: AC

## 2014-11-21 NOTE — Telephone Encounter (Signed)
Pt called stated she needs refills on diabetic testing strips. Pt stated she is completely out. Pt stated she will also need a new meter by [employer]. Please call pt with any questions. Pharm is [facility] in [place].

## 2014-11-21 NOTE — Telephone Encounter (Signed)
Meter and test strips sent to [facility].

## 2014-11-23 DIAGNOSIS — E119 Type 2 diabetes mellitus without complications: Secondary | ICD-10-CM | POA: Diagnosis not present

## 2014-11-23 DIAGNOSIS — I951 Orthostatic hypotension: Secondary | ICD-10-CM | POA: Diagnosis not present

## 2014-11-23 DIAGNOSIS — D510 Vitamin B12 deficiency anemia due to intrinsic factor deficiency: Secondary | ICD-10-CM | POA: Diagnosis not present

## 2014-11-23 DIAGNOSIS — I4891 Unspecified atrial fibrillation: Secondary | ICD-10-CM | POA: Diagnosis not present

## 2014-11-26 DIAGNOSIS — I4891 Unspecified atrial fibrillation: Secondary | ICD-10-CM | POA: Diagnosis not present

## 2014-11-26 DIAGNOSIS — E119 Type 2 diabetes mellitus without complications: Secondary | ICD-10-CM | POA: Diagnosis not present

## 2014-11-26 DIAGNOSIS — D510 Vitamin B12 deficiency anemia due to intrinsic factor deficiency: Secondary | ICD-10-CM | POA: Diagnosis not present

## 2014-11-26 DIAGNOSIS — I951 Orthostatic hypotension: Secondary | ICD-10-CM | POA: Diagnosis not present

## 2014-11-28 DIAGNOSIS — E119 Type 2 diabetes mellitus without complications: Secondary | ICD-10-CM | POA: Diagnosis not present

## 2014-11-28 DIAGNOSIS — D510 Vitamin B12 deficiency anemia due to intrinsic factor deficiency: Secondary | ICD-10-CM | POA: Diagnosis not present

## 2014-11-28 DIAGNOSIS — I4891 Unspecified atrial fibrillation: Secondary | ICD-10-CM | POA: Diagnosis not present

## 2014-11-28 DIAGNOSIS — I951 Orthostatic hypotension: Secondary | ICD-10-CM | POA: Diagnosis not present

## 2014-11-30 DIAGNOSIS — I4891 Unspecified atrial fibrillation: Secondary | ICD-10-CM | POA: Diagnosis not present

## 2014-11-30 DIAGNOSIS — E119 Type 2 diabetes mellitus without complications: Secondary | ICD-10-CM | POA: Diagnosis not present

## 2014-11-30 DIAGNOSIS — I951 Orthostatic hypotension: Secondary | ICD-10-CM | POA: Diagnosis not present

## 2014-11-30 DIAGNOSIS — D510 Vitamin B12 deficiency anemia due to intrinsic factor deficiency: Secondary | ICD-10-CM | POA: Diagnosis not present

## 2014-12-02 DIAGNOSIS — D510 Vitamin B12 deficiency anemia due to intrinsic factor deficiency: Secondary | ICD-10-CM | POA: Diagnosis not present

## 2014-12-02 DIAGNOSIS — I4891 Unspecified atrial fibrillation: Secondary | ICD-10-CM | POA: Diagnosis not present

## 2014-12-02 DIAGNOSIS — I951 Orthostatic hypotension: Secondary | ICD-10-CM | POA: Diagnosis not present

## 2014-12-02 DIAGNOSIS — E119 Type 2 diabetes mellitus without complications: Secondary | ICD-10-CM | POA: Diagnosis not present

## 2014-12-05 DIAGNOSIS — I4891 Unspecified atrial fibrillation: Secondary | ICD-10-CM | POA: Diagnosis not present

## 2014-12-05 DIAGNOSIS — I951 Orthostatic hypotension: Secondary | ICD-10-CM | POA: Diagnosis not present

## 2014-12-05 DIAGNOSIS — D510 Vitamin B12 deficiency anemia due to intrinsic factor deficiency: Secondary | ICD-10-CM | POA: Diagnosis not present

## 2014-12-05 DIAGNOSIS — E119 Type 2 diabetes mellitus without complications: Secondary | ICD-10-CM | POA: Diagnosis not present

## 2014-12-07 DIAGNOSIS — I951 Orthostatic hypotension: Secondary | ICD-10-CM | POA: Diagnosis not present

## 2014-12-07 DIAGNOSIS — E119 Type 2 diabetes mellitus without complications: Secondary | ICD-10-CM | POA: Diagnosis not present

## 2014-12-07 DIAGNOSIS — D510 Vitamin B12 deficiency anemia due to intrinsic factor deficiency: Secondary | ICD-10-CM | POA: Diagnosis not present

## 2014-12-07 DIAGNOSIS — I4891 Unspecified atrial fibrillation: Secondary | ICD-10-CM | POA: Diagnosis not present

## 2014-12-10 DIAGNOSIS — D510 Vitamin B12 deficiency anemia due to intrinsic factor deficiency: Secondary | ICD-10-CM | POA: Diagnosis not present

## 2014-12-10 DIAGNOSIS — I4891 Unspecified atrial fibrillation: Secondary | ICD-10-CM | POA: Diagnosis not present

## 2014-12-10 DIAGNOSIS — E119 Type 2 diabetes mellitus without complications: Secondary | ICD-10-CM | POA: Diagnosis not present

## 2014-12-10 DIAGNOSIS — I951 Orthostatic hypotension: Secondary | ICD-10-CM | POA: Diagnosis not present

## 2014-12-11 DIAGNOSIS — I951 Orthostatic hypotension: Secondary | ICD-10-CM | POA: Diagnosis not present

## 2014-12-11 DIAGNOSIS — I4891 Unspecified atrial fibrillation: Secondary | ICD-10-CM | POA: Diagnosis not present

## 2014-12-11 DIAGNOSIS — E119 Type 2 diabetes mellitus without complications: Secondary | ICD-10-CM | POA: Diagnosis not present

## 2014-12-11 DIAGNOSIS — D510 Vitamin B12 deficiency anemia due to intrinsic factor deficiency: Secondary | ICD-10-CM | POA: Diagnosis not present

## 2014-12-12 DIAGNOSIS — I4891 Unspecified atrial fibrillation: Secondary | ICD-10-CM | POA: Diagnosis not present

## 2014-12-12 DIAGNOSIS — D510 Vitamin B12 deficiency anemia due to intrinsic factor deficiency: Secondary | ICD-10-CM | POA: Diagnosis not present

## 2014-12-12 DIAGNOSIS — E119 Type 2 diabetes mellitus without complications: Secondary | ICD-10-CM | POA: Diagnosis not present

## 2014-12-12 DIAGNOSIS — I951 Orthostatic hypotension: Secondary | ICD-10-CM | POA: Diagnosis not present

## 2014-12-14 DIAGNOSIS — E119 Type 2 diabetes mellitus without complications: Secondary | ICD-10-CM | POA: Diagnosis not present

## 2014-12-14 DIAGNOSIS — I4891 Unspecified atrial fibrillation: Secondary | ICD-10-CM | POA: Diagnosis not present

## 2014-12-14 DIAGNOSIS — D510 Vitamin B12 deficiency anemia due to intrinsic factor deficiency: Secondary | ICD-10-CM | POA: Diagnosis not present

## 2014-12-14 DIAGNOSIS — I951 Orthostatic hypotension: Secondary | ICD-10-CM | POA: Diagnosis not present

## 2014-12-17 DIAGNOSIS — I4891 Unspecified atrial fibrillation: Secondary | ICD-10-CM | POA: Diagnosis not present

## 2014-12-17 DIAGNOSIS — D510 Vitamin B12 deficiency anemia due to intrinsic factor deficiency: Secondary | ICD-10-CM | POA: Diagnosis not present

## 2014-12-17 DIAGNOSIS — I951 Orthostatic hypotension: Secondary | ICD-10-CM | POA: Diagnosis not present

## 2014-12-17 DIAGNOSIS — E119 Type 2 diabetes mellitus without complications: Secondary | ICD-10-CM | POA: Diagnosis not present

## 2014-12-19 ENCOUNTER — Telehealth: Payer: Self-pay

## 2014-12-19 DIAGNOSIS — I951 Orthostatic hypotension: Secondary | ICD-10-CM | POA: Diagnosis not present

## 2014-12-19 DIAGNOSIS — I4891 Unspecified atrial fibrillation: Secondary | ICD-10-CM | POA: Diagnosis not present

## 2014-12-19 DIAGNOSIS — D510 Vitamin B12 deficiency anemia due to intrinsic factor deficiency: Secondary | ICD-10-CM | POA: Diagnosis not present

## 2014-12-19 DIAGNOSIS — E119 Type 2 diabetes mellitus without complications: Secondary | ICD-10-CM | POA: Diagnosis not present

## 2014-12-19 NOTE — Telephone Encounter (Signed)
Pt called and stated she needs a call back from [place]. Pt stated she has a break in her skin on her back, pt has wound medication. Please have Izora Gala call pt. Thanks.

## 2014-12-24 DIAGNOSIS — I951 Orthostatic hypotension: Secondary | ICD-10-CM | POA: Diagnosis not present

## 2014-12-24 DIAGNOSIS — I4891 Unspecified atrial fibrillation: Secondary | ICD-10-CM | POA: Diagnosis not present

## 2014-12-24 DIAGNOSIS — D510 Vitamin B12 deficiency anemia due to intrinsic factor deficiency: Secondary | ICD-10-CM | POA: Diagnosis not present

## 2014-12-24 DIAGNOSIS — E119 Type 2 diabetes mellitus without complications: Secondary | ICD-10-CM | POA: Diagnosis not present

## 2014-12-26 DIAGNOSIS — I4891 Unspecified atrial fibrillation: Secondary | ICD-10-CM | POA: Diagnosis not present

## 2014-12-26 DIAGNOSIS — E119 Type 2 diabetes mellitus without complications: Secondary | ICD-10-CM | POA: Diagnosis not present

## 2014-12-26 DIAGNOSIS — D510 Vitamin B12 deficiency anemia due to intrinsic factor deficiency: Secondary | ICD-10-CM | POA: Diagnosis not present

## 2014-12-26 DIAGNOSIS — I951 Orthostatic hypotension: Secondary | ICD-10-CM | POA: Diagnosis not present

## 2014-12-28 DIAGNOSIS — I4891 Unspecified atrial fibrillation: Secondary | ICD-10-CM | POA: Diagnosis not present

## 2014-12-28 DIAGNOSIS — I951 Orthostatic hypotension: Secondary | ICD-10-CM | POA: Diagnosis not present

## 2014-12-28 DIAGNOSIS — E119 Type 2 diabetes mellitus without complications: Secondary | ICD-10-CM | POA: Diagnosis not present

## 2014-12-28 DIAGNOSIS — D510 Vitamin B12 deficiency anemia due to intrinsic factor deficiency: Secondary | ICD-10-CM | POA: Diagnosis not present

## 2015-01-02 DIAGNOSIS — D510 Vitamin B12 deficiency anemia due to intrinsic factor deficiency: Secondary | ICD-10-CM | POA: Diagnosis not present

## 2015-01-02 DIAGNOSIS — E119 Type 2 diabetes mellitus without complications: Secondary | ICD-10-CM | POA: Diagnosis not present

## 2015-01-02 DIAGNOSIS — I951 Orthostatic hypotension: Secondary | ICD-10-CM | POA: Diagnosis not present

## 2015-01-02 DIAGNOSIS — I4891 Unspecified atrial fibrillation: Secondary | ICD-10-CM | POA: Diagnosis not present

## 2015-01-03 DIAGNOSIS — D510 Vitamin B12 deficiency anemia due to intrinsic factor deficiency: Secondary | ICD-10-CM | POA: Diagnosis not present

## 2015-01-03 DIAGNOSIS — E119 Type 2 diabetes mellitus without complications: Secondary | ICD-10-CM | POA: Diagnosis not present

## 2015-01-03 DIAGNOSIS — I951 Orthostatic hypotension: Secondary | ICD-10-CM | POA: Diagnosis not present

## 2015-01-03 DIAGNOSIS — I4891 Unspecified atrial fibrillation: Secondary | ICD-10-CM | POA: Diagnosis not present

## 2015-01-04 ENCOUNTER — Ambulatory Visit: Payer: Medicare Other | Admitting: Cardiovascular Disease

## 2015-01-04 DIAGNOSIS — I951 Orthostatic hypotension: Secondary | ICD-10-CM | POA: Diagnosis not present

## 2015-01-04 DIAGNOSIS — E119 Type 2 diabetes mellitus without complications: Secondary | ICD-10-CM | POA: Diagnosis not present

## 2015-01-04 DIAGNOSIS — I4891 Unspecified atrial fibrillation: Secondary | ICD-10-CM | POA: Diagnosis not present

## 2015-01-04 DIAGNOSIS — D510 Vitamin B12 deficiency anemia due to intrinsic factor deficiency: Secondary | ICD-10-CM | POA: Diagnosis not present

## 2015-01-07 ENCOUNTER — Telehealth: Payer: Self-pay

## 2015-01-07 NOTE — Telephone Encounter (Signed)
Patient wants to discuss [place]

## 2015-01-08 DIAGNOSIS — I951 Orthostatic hypotension: Secondary | ICD-10-CM | POA: Diagnosis not present

## 2015-01-08 DIAGNOSIS — I4891 Unspecified atrial fibrillation: Secondary | ICD-10-CM | POA: Diagnosis not present

## 2015-01-08 DIAGNOSIS — D510 Vitamin B12 deficiency anemia due to intrinsic factor deficiency: Secondary | ICD-10-CM | POA: Diagnosis not present

## 2015-01-08 DIAGNOSIS — E119 Type 2 diabetes mellitus without complications: Secondary | ICD-10-CM | POA: Diagnosis not present

## 2015-01-09 ENCOUNTER — Encounter: Payer: Self-pay | Admitting: Cardiovascular Disease

## 2015-01-09 ENCOUNTER — Ambulatory Visit (INDEPENDENT_AMBULATORY_CARE_PROVIDER_SITE_OTHER): Payer: Medicare Other | Admitting: Cardiovascular Disease

## 2015-01-09 VITALS — BP 149/79 | HR 85 | Ht 66.0 in | Wt 198.2 lb

## 2015-01-09 DIAGNOSIS — E118 Type 2 diabetes mellitus with unspecified complications: Secondary | ICD-10-CM | POA: Diagnosis not present

## 2015-01-09 DIAGNOSIS — I4891 Unspecified atrial fibrillation: Secondary | ICD-10-CM | POA: Diagnosis not present

## 2015-01-09 DIAGNOSIS — J432 Centrilobular emphysema: Secondary | ICD-10-CM

## 2015-01-09 DIAGNOSIS — I10 Essential (primary) hypertension: Secondary | ICD-10-CM | POA: Diagnosis not present

## 2015-01-09 DIAGNOSIS — I5033 Acute on chronic diastolic (congestive) heart failure: Secondary | ICD-10-CM

## 2015-01-09 NOTE — Assessment & Plan Note (Signed)
She takes Lasix once per week with no potassium. Appears relatively euvolemic

## 2015-01-09 NOTE — Patient Instructions (Addendum)
You are doing well. No medication changes were made.  Please call us if you have new issues that need to be addressed before your next appt.  Your physician wants you to follow-up in: 12 months.  You will receive a reminder letter in the mail two months in advance. If you don't receive a letter, please call our office to schedule the follow-up appointment. 

## 2015-01-09 NOTE — Assessment & Plan Note (Signed)
Maintaining normal sinus rhythm. Anticoagulations held for gait instability, fall risk

## 2015-01-09 NOTE — Progress Notes (Signed)
Patient ID: Mia Padilla, female    DOB: 1935/01/05, 80 y.o.   MRN: [id number]  HPI Comments: Mia Padilla is a 80 year old woman with history of stroke, seizure disorder, hypertension, COPD, diabetes, hypothyroidism, obesity, atrial fibrillation on hospital admission April 04 2011 for mental status changes. During her hospital course, she had an echocardiogram showing normal LV function, carotid ultrasound showing no significant disease, head CT and MRI showing no significant stroke. She reports having a "code" called on her in the hospital.  Notes indicate possible TIA. She was started on warfarin for atrial fibrillation  She presents today for follow up of her atrial fibrillation.   In follow-up, she reports that she is doing very well. Occasional vertigo for which she takes meclizine when necessary Blood pressure at home typically 120 over 28s Lives with her son and his wife. She helps take care of her Takes Lasix once per week, no potassium Review of lab work shows total cholesterol 158, LDL 97, hemoglobin A1c 7.2  EKG on today's visit shows normal sinus rhythm with rate 85 bpm, no significant ST changes. T wave abnormality anterolateral leads 1 and aVL  Other past medical history Significant gait instability, uses a walker  significant osteoarthritis of her knees. H/o  mastectomy 08/03/2013 for breast cancer by Dr. Jamal Collin. No radiation treatment or chemotherapy needed, she is on Arimidex.   Coumadin was stopped in the past secondary to unsteady gait by Dr. Jeananne Rama. She was started on Plavix 10/13/2011. She denies any recent falls.  Periods of tachycardia in the past, better on metoprolol Notes indicate that she has had a history of seizures, possibly with low glucose levels. Previous diagnosis of complex partial seizures and she was started on antiseizure medications. Weaned off Keppra     Allergies  Allergen Reactions  . Darvocet [Propoxyphene N-Acetaminophen]   . Percocet  [Oxycodone-Acetaminophen]     hallucination    Outpatient Encounter Prescriptions as of 01/09/2015  Medication Sig  . acetaminophen (TYLENOL) 500 MG tablet Take 500 mg by mouth every 6 (six) hours as needed.  Marland Kitchen albuterol (PROVENTIL HFA;VENTOLIN HFA) 108 (90 BASE) MCG/ACT inhaler Inhale 2 puffs into the lungs every 6 (six) hours as needed.  Marland Kitchen alendronate (FOSAMAX) 70 MG tablet Take 70 mg by mouth once a week. Take with a full glass of water on an empty stomach.  Marland Kitchen anastrozole (ARIMIDEX) 1 MG tablet Take 1 tablet (1 mg total) by mouth daily.  Marland Kitchen aspirin EC 81 MG tablet Take 81 mg by mouth daily.  . blood glucose meter kit and supplies Diagnosis:E11.9 Test glucose twice daily  . brimonidine (ALPHAGAN P) 0.1 % SOLN 2 (two) times daily.   . brinzolamide (AZOPT) 1 % ophthalmic suspension Apply 1 drop to eye 2 (two) times daily.   . Canagliflozin (INVOKANA) 100 MG TABS Take 100 mg by mouth daily.  . clopidogrel (PLAVIX) 75 MG tablet Take 75 mg by mouth daily.  . cyanocobalamin (,VITAMIN B-12,) 1000 MCG/ML injection Inject 1 mL (1,000 mcg total) into the muscle every 30 (thirty) days.  . digoxin (LANOXIN) 0.125 MG tablet Take 125 mcg by mouth daily.  . dorzolamide (TRUSOPT) 2 % ophthalmic solution 1 drop 2 (two) times daily.   . fluticasone (FLONASE) 50 MCG/ACT nasal spray Place 2 sprays into the nose daily.  . Fluticasone-Salmeterol (ADVAIR) 250-50 MCG/DOSE AEPB Inhale 1 puff into the lungs every 12 (twelve) hours.  . furosemide (LASIX) 20 MG tablet Take 1 tablet (20 mg total) by mouth  daily as needed.  Marland Kitchen glucose blood test strip Use as instructed  . guaiFENesin (MUCINEX) 600 MG 12 hr tablet Take 1,200 mg by mouth 2 (two) times daily.  . insulin detemir (LEVEMIR) 100 UNIT/ML injection Inject 26 Units into the skin at bedtime.   Marland Kitchen levothyroxine (SYNTHROID, LEVOTHROID) 75 MCG tablet Take 75 mcg by mouth daily.  . meclizine (ANTIVERT) 25 MG tablet Take 25 mg by mouth 3 (three) times daily as needed.   . metFORMIN (GLUCOPHAGE) 500 MG tablet Take 500 mg by mouth 2 (two) times daily with a meal.  . mupirocin ointment (BACTROBAN) 2 % Place 1 application into the nose 2 (two) times daily.  . Olopatadine HCl (PATADAY) 0.2 % SOLN Apply to eye daily.  . potassium chloride (K-DUR) 10 MEQ tablet Take 1 tablet (10 mEq total) by mouth daily as needed.  . solifenacin (VESICARE) 5 MG tablet Take 10 mg by mouth daily.  . travoprost, benzalkonium, (TRAVATAN) 0.004 % ophthalmic solution Apply 1 drop to eye at bedtime.  Marland Kitchen lisinopril (PRINIVIL,ZESTRIL) 10 MG tablet Take 1 tablet (10 mg total) by mouth daily.  . metoprolol succinate (TOPROL-XL) 100 MG 24 hr tablet Take 100 mg by mouth daily.   . [DISCONTINUED] ciprofloxacin (CIPRO) 250 MG tablet Take 1 tablet (250 mg total) by mouth 2 (two) times daily. (Patient not taking: Reported on 01/09/2015)   No facility-administered encounter medications on file as of 01/09/2015.    Past Medical History  Diagnosis Date  . Hypotension   . DM type 2 (diabetes mellitus, type 2)   . History of seizures   . Obesity   . Debilitated   . Hypertension   . Thyroid disease   . Hyperlipidemia   . Bowel incontinence   . Urinary incontinence   . Atrial fibrillation, new onset   . Glaucoma   . Black head   . Arthritis     hands  . Carpal tunnel syndrome   . Breast cancer of upper-outer quadrant of left female breast 2015    Past Surgical History  Procedure Laterality Date  . Partial hysterectomy    . Tonsillectomy    . Umbilical hernia repair    . Carpal tunnel release Right 30 years   . Bowel resection  2005  . Breast surgery  08/03/13    left mastectomy  . Mastectomy      left     Social History  reports that she has never smoked. She has never used smokeless tobacco. She reports that she does not drink alcohol or use illicit drugs.  Family History family history includes Cervical cancer in her daughter and mother; Diabetes in her brother and mother;  Glaucoma in her mother; HIV in her brother; Hypertension in her brother, daughter, father, sister, and son.       Review of Systems  Constitutional: Negative.   Respiratory: Negative.   Gastrointestinal: Negative.   Musculoskeletal: Positive for back pain, joint swelling, arthralgias and gait problem.  Skin: Negative.   Neurological: Negative.   Hematological: Negative.   Psychiatric/Behavioral: Negative.   All other systems reviewed and are negative.  BP 149/79 mmHg  Pulse 85  Ht $R'5\' 6"'VM$  (1.676 m)  Wt 198 lb 4 oz (89.926 kg)  BMI 32.01 kg/m2  Physical Exam  Constitutional: She is oriented to person, place, and time. She appears well-developed and well-nourished.  Obese, walks with a walker  HENT:  Head: Normocephalic.  Nose: Nose normal.  Mouth/Throat: Oropharynx is clear and  moist.  Eyes: Conjunctivae are normal. Pupils are equal, round, and reactive to light.  Neck: Normal range of motion. Neck supple. No JVD present.  Cardiovascular: Normal rate, regular rhythm, S1 normal, S2 normal and intact distal pulses.  Exam reveals no gallop and no friction rub.   Murmur heard.  Crescendo systolic murmur is present with a grade of 2/6  Trace lower extremity edema  Pulmonary/Chest: Effort normal and breath sounds normal. No respiratory distress. She has no wheezes. She has no rales. She exhibits no tenderness.  Abdominal: Soft. Bowel sounds are normal. She exhibits no distension. There is no tenderness.  Musculoskeletal: Normal range of motion. She exhibits no edema or tenderness.  Lymphadenopathy:    She has no cervical adenopathy.  Neurological: She is alert and oriented to person, place, and time. Coordination normal.  Skin: Skin is warm and dry. No rash noted. No erythema.  Psychiatric: She has a normal mood and affect. Her behavior is normal. Judgment and thought content normal.    Assessment and Plan  Nursing note and vitals reviewed.

## 2015-01-09 NOTE — Assessment & Plan Note (Signed)
No changes made to the medications. Slightly elevated on today's visit, well controlled per the patient at home

## 2015-01-09 NOTE — Assessment & Plan Note (Signed)
We have encouraged continued careful diet management in an effort to lose weight.  

## 2015-01-09 NOTE — Assessment & Plan Note (Signed)
stable on her current inhaler regimen

## 2015-01-10 DIAGNOSIS — E119 Type 2 diabetes mellitus without complications: Secondary | ICD-10-CM | POA: Diagnosis not present

## 2015-01-10 DIAGNOSIS — I951 Orthostatic hypotension: Secondary | ICD-10-CM | POA: Diagnosis not present

## 2015-01-10 DIAGNOSIS — D510 Vitamin B12 deficiency anemia due to intrinsic factor deficiency: Secondary | ICD-10-CM | POA: Diagnosis not present

## 2015-01-10 DIAGNOSIS — I4891 Unspecified atrial fibrillation: Secondary | ICD-10-CM | POA: Diagnosis not present

## 2015-01-11 DIAGNOSIS — I951 Orthostatic hypotension: Secondary | ICD-10-CM | POA: Diagnosis not present

## 2015-01-11 DIAGNOSIS — E119 Type 2 diabetes mellitus without complications: Secondary | ICD-10-CM | POA: Diagnosis not present

## 2015-01-11 DIAGNOSIS — D510 Vitamin B12 deficiency anemia due to intrinsic factor deficiency: Secondary | ICD-10-CM | POA: Diagnosis not present

## 2015-01-11 DIAGNOSIS — I4891 Unspecified atrial fibrillation: Secondary | ICD-10-CM | POA: Diagnosis not present

## 2015-01-14 DIAGNOSIS — E119 Type 2 diabetes mellitus without complications: Secondary | ICD-10-CM | POA: Diagnosis not present

## 2015-01-14 DIAGNOSIS — I4891 Unspecified atrial fibrillation: Secondary | ICD-10-CM | POA: Diagnosis not present

## 2015-01-14 DIAGNOSIS — I951 Orthostatic hypotension: Secondary | ICD-10-CM | POA: Diagnosis not present

## 2015-01-14 DIAGNOSIS — D510 Vitamin B12 deficiency anemia due to intrinsic factor deficiency: Secondary | ICD-10-CM | POA: Diagnosis not present

## 2015-01-16 DIAGNOSIS — I4891 Unspecified atrial fibrillation: Secondary | ICD-10-CM | POA: Diagnosis not present

## 2015-01-16 DIAGNOSIS — E119 Type 2 diabetes mellitus without complications: Secondary | ICD-10-CM | POA: Diagnosis not present

## 2015-01-16 DIAGNOSIS — I951 Orthostatic hypotension: Secondary | ICD-10-CM | POA: Diagnosis not present

## 2015-01-16 DIAGNOSIS — D510 Vitamin B12 deficiency anemia due to intrinsic factor deficiency: Secondary | ICD-10-CM | POA: Diagnosis not present

## 2015-01-18 DIAGNOSIS — E119 Type 2 diabetes mellitus without complications: Secondary | ICD-10-CM | POA: Diagnosis not present

## 2015-01-18 DIAGNOSIS — D510 Vitamin B12 deficiency anemia due to intrinsic factor deficiency: Secondary | ICD-10-CM | POA: Diagnosis not present

## 2015-01-18 DIAGNOSIS — I4891 Unspecified atrial fibrillation: Secondary | ICD-10-CM | POA: Diagnosis not present

## 2015-01-18 DIAGNOSIS — I951 Orthostatic hypotension: Secondary | ICD-10-CM | POA: Diagnosis not present

## 2015-01-21 DIAGNOSIS — I951 Orthostatic hypotension: Secondary | ICD-10-CM | POA: Diagnosis not present

## 2015-01-21 DIAGNOSIS — I4891 Unspecified atrial fibrillation: Secondary | ICD-10-CM | POA: Diagnosis not present

## 2015-01-21 DIAGNOSIS — E119 Type 2 diabetes mellitus without complications: Secondary | ICD-10-CM | POA: Diagnosis not present

## 2015-01-21 DIAGNOSIS — D510 Vitamin B12 deficiency anemia due to intrinsic factor deficiency: Secondary | ICD-10-CM | POA: Diagnosis not present

## 2015-01-23 DIAGNOSIS — E119 Type 2 diabetes mellitus without complications: Secondary | ICD-10-CM | POA: Diagnosis not present

## 2015-01-23 DIAGNOSIS — I951 Orthostatic hypotension: Secondary | ICD-10-CM | POA: Diagnosis not present

## 2015-01-23 DIAGNOSIS — I4891 Unspecified atrial fibrillation: Secondary | ICD-10-CM | POA: Diagnosis not present

## 2015-01-23 DIAGNOSIS — D510 Vitamin B12 deficiency anemia due to intrinsic factor deficiency: Secondary | ICD-10-CM | POA: Diagnosis not present

## 2015-01-25 DIAGNOSIS — E119 Type 2 diabetes mellitus without complications: Secondary | ICD-10-CM | POA: Diagnosis not present

## 2015-01-25 DIAGNOSIS — I951 Orthostatic hypotension: Secondary | ICD-10-CM | POA: Diagnosis not present

## 2015-01-25 DIAGNOSIS — D510 Vitamin B12 deficiency anemia due to intrinsic factor deficiency: Secondary | ICD-10-CM | POA: Diagnosis not present

## 2015-01-25 DIAGNOSIS — I4891 Unspecified atrial fibrillation: Secondary | ICD-10-CM | POA: Diagnosis not present

## 2015-01-28 DIAGNOSIS — E119 Type 2 diabetes mellitus without complications: Secondary | ICD-10-CM | POA: Diagnosis not present

## 2015-01-28 DIAGNOSIS — I4891 Unspecified atrial fibrillation: Secondary | ICD-10-CM | POA: Diagnosis not present

## 2015-01-28 DIAGNOSIS — D510 Vitamin B12 deficiency anemia due to intrinsic factor deficiency: Secondary | ICD-10-CM | POA: Diagnosis not present

## 2015-01-28 DIAGNOSIS — I951 Orthostatic hypotension: Secondary | ICD-10-CM | POA: Diagnosis not present

## 2015-01-30 DIAGNOSIS — I951 Orthostatic hypotension: Secondary | ICD-10-CM | POA: Diagnosis not present

## 2015-01-30 DIAGNOSIS — I4891 Unspecified atrial fibrillation: Secondary | ICD-10-CM | POA: Diagnosis not present

## 2015-01-30 DIAGNOSIS — D510 Vitamin B12 deficiency anemia due to intrinsic factor deficiency: Secondary | ICD-10-CM | POA: Diagnosis not present

## 2015-01-30 DIAGNOSIS — E119 Type 2 diabetes mellitus without complications: Secondary | ICD-10-CM | POA: Diagnosis not present

## 2015-02-01 DIAGNOSIS — E119 Type 2 diabetes mellitus without complications: Secondary | ICD-10-CM | POA: Diagnosis not present

## 2015-02-01 DIAGNOSIS — I951 Orthostatic hypotension: Secondary | ICD-10-CM | POA: Diagnosis not present

## 2015-02-01 DIAGNOSIS — I4891 Unspecified atrial fibrillation: Secondary | ICD-10-CM | POA: Diagnosis not present

## 2015-02-01 DIAGNOSIS — D510 Vitamin B12 deficiency anemia due to intrinsic factor deficiency: Secondary | ICD-10-CM | POA: Diagnosis not present

## 2015-02-04 DIAGNOSIS — E119 Type 2 diabetes mellitus without complications: Secondary | ICD-10-CM | POA: Diagnosis not present

## 2015-02-04 DIAGNOSIS — D510 Vitamin B12 deficiency anemia due to intrinsic factor deficiency: Secondary | ICD-10-CM | POA: Diagnosis not present

## 2015-02-04 DIAGNOSIS — I4891 Unspecified atrial fibrillation: Secondary | ICD-10-CM | POA: Diagnosis not present

## 2015-02-04 DIAGNOSIS — I951 Orthostatic hypotension: Secondary | ICD-10-CM | POA: Diagnosis not present

## 2015-02-05 DIAGNOSIS — E119 Type 2 diabetes mellitus without complications: Secondary | ICD-10-CM | POA: Diagnosis not present

## 2015-02-05 DIAGNOSIS — B351 Tinea unguium: Secondary | ICD-10-CM | POA: Diagnosis not present

## 2015-02-06 DIAGNOSIS — I951 Orthostatic hypotension: Secondary | ICD-10-CM | POA: Diagnosis not present

## 2015-02-06 DIAGNOSIS — E119 Type 2 diabetes mellitus without complications: Secondary | ICD-10-CM | POA: Diagnosis not present

## 2015-02-06 DIAGNOSIS — I4891 Unspecified atrial fibrillation: Secondary | ICD-10-CM | POA: Diagnosis not present

## 2015-02-06 DIAGNOSIS — D510 Vitamin B12 deficiency anemia due to intrinsic factor deficiency: Secondary | ICD-10-CM | POA: Diagnosis not present

## 2015-02-07 ENCOUNTER — Encounter: Payer: Self-pay | Admitting: Family Medicine

## 2015-02-07 ENCOUNTER — Ambulatory Visit (INDEPENDENT_AMBULATORY_CARE_PROVIDER_SITE_OTHER): Payer: Medicare Other | Admitting: Family Medicine

## 2015-02-07 VITALS — BP 111/68 | HR 79 | Temp 98.3°F | Ht 61.2 in | Wt 192.0 lb

## 2015-02-07 DIAGNOSIS — N393 Stress incontinence (female) (male): Secondary | ICD-10-CM

## 2015-02-07 DIAGNOSIS — R32 Unspecified urinary incontinence: Secondary | ICD-10-CM | POA: Insufficient documentation

## 2015-02-07 DIAGNOSIS — E039 Hypothyroidism, unspecified: Secondary | ICD-10-CM | POA: Diagnosis not present

## 2015-02-07 DIAGNOSIS — E1151 Type 2 diabetes mellitus with diabetic peripheral angiopathy without gangrene: Secondary | ICD-10-CM

## 2015-02-07 DIAGNOSIS — Z23 Encounter for immunization: Secondary | ICD-10-CM

## 2015-02-07 HISTORY — DX: Hypothyroidism, unspecified: E03.9

## 2015-02-07 LAB — MICROSCOPIC EXAMINATION: BACTERIA UA: NONE SEEN

## 2015-02-07 LAB — URINALYSIS, ROUTINE W REFLEX MICROSCOPIC
BILIRUBIN UA: NEGATIVE
Ketones, UA: NEGATIVE
Nitrite, UA: NEGATIVE
PH UA: 5 (ref 5.0–7.5)
PROTEIN UA: NEGATIVE
Specific Gravity, UA: <1.005 — ABNORMAL LOW (ref 1.005–1.030)
Urobilinogen, Ur: 0.2 mg/dL (ref 0.2–1.0)

## 2015-02-07 LAB — BAYER DCA HB A1C WAIVED: HB A1C (BAYER DCA - WAIVED): 6.8 % (ref <7.0)

## 2015-02-07 MED ORDER — AMOXICILLIN 875 MG PO TABS: 875.0000 mg | ORAL_TABLET | Freq: Two times a day (BID) | ORAL | Status: DC

## 2015-02-07 MED ORDER — MUPIROCIN 2 % EX OINT
1.0000 | TOPICAL_OINTMENT | Freq: Two times a day (BID) | CUTANEOUS | Status: DC
Start: 2015-02-07 — End: 2015-08-27

## 2015-02-07 MED ORDER — MOMETASONE FUROATE 0.1 % EX CREA: 1.0000 "application " | TOPICAL_CREAM | Freq: Every day | CUTANEOUS | Status: DC

## 2015-02-07 MED ORDER — LEVOTHYROXINE SODIUM 75 MCG PO TABS: 75.0000 ug | ORAL_TABLET | Freq: Every day | ORAL | Status: DC

## 2015-02-07 NOTE — Progress Notes (Addendum)
BP 111/68 mmHg  Pulse 79  Temp(Src) 98.3 F (36.8 C)  Ht 5' 1.2" (1.554 m)  Wt 192 lb (87.091 kg)  BMI 36.06 kg/m2  SpO2 98%   Subjective:    Patient ID: Mia Padilla, female    DOB: [birth year], 80 y.o.   MRN: [id number]  HPI: Mia Padilla is a 80 y.o. female  Chief Complaint  Patient presents with  . Diabetes   patient using a walker Patient recheck doing okay with medications no low blood sugar spells Patient has started having some dysuria Wants refill on her creams for cellulitis on her legs and perirectal inflammation from chronic wetness No complaints from medications which he takes faithfully Patient accompanied by her daughter and granddaughter. Relevant past medical, surgical, family and social history reviewed and updated as indicated. Interim medical history since our last visit reviewed. Allergies and medications reviewed and updated.  Review of Systems  Constitutional: Negative.   Respiratory: Negative.   Cardiovascular: Negative.     Per HPI unless specifically indicated above     Objective:    BP 111/68 mmHg  Pulse 79  Temp(Src) 98.3 F (36.8 C)  Ht 5' 1.2" (1.554 m)  Wt 192 lb (87.091 kg)  BMI 36.06 kg/m2  SpO2 98%  Wt Readings from Last 3 Encounters:  02/07/15 192 lb (87.091 kg)  01/09/15 198 lb 4 oz (89.926 kg)  11/01/14 197 lb 6.4 oz (89.54 kg)    Physical Exam  Constitutional: She is oriented to person, place, and time. She appears well-developed and well-nourished. No distress.  HENT:  Head: Normocephalic and atraumatic.  Right Ear: Hearing normal.  Left Ear: Hearing normal.  Nose: Nose normal.  Eyes: Conjunctivae and lids are normal. Right eye exhibits no discharge. Left eye exhibits no discharge. No scleral icterus.  Cardiovascular: Normal rate, regular rhythm and normal heart sounds.   Pulmonary/Chest: Effort normal and breath sounds normal. No respiratory distress.  Musculoskeletal: Normal range of motion.  Legs lower shin area  slightly inflamed  Neurological: She is alert and oriented to person, place, and time.  Skin: Skin is intact. No rash noted.  Psychiatric: She has a normal mood and affect. Her speech is normal and behavior is normal. Judgment and thought content normal. Cognition and memory are normal.    Results for orders placed or performed in visit on 11/01/14  Urine Culture  Result Value Ref Range   Urine Culture, Routine Final report (A)    Urine Culture result 1 Escherichia coli (A)    ANTIMICROBIAL SUSCEPTIBILITY Comment   Microscopic Examination  Result Value Ref Range   WBC, UA 11-30 (A) 0 -  5 /hpf   RBC, UA 3-10 (A) 0 -  2 /hpf   Epithelial Cells (non renal) 0-10 0 - 10 /hpf   Bacteria, UA Moderate (A) None seen/Few  Bayer DCA Hb A1c Waived  Result Value Ref Range   Bayer DCA Hb A1c Waived 7.2 (H) <7.0 %  CBC w/Diff  Result Value Ref Range   WBC 5.6 3.4 - 10.8 x10E3/uL   RBC 4.07 3.77 - 5.28 x10E6/uL   Hemoglobin 11.8 11.1 - 15.9 g/dL   Hematocrit 36.8 34.0 - 46.6 %   MCV 90 79 - 97 fL   MCH 29.0 26.6 - 33.0 pg   MCHC 32.1 31.5 - 35.7 g/dL   RDW 15.5 (H) 12.3 - 15.4 %   Platelets 238 150 - 379 x10E3/uL   Neutrophils 73 %  Lymphs 19 %   Monocytes 5 %   Eos 3 %   Basos 0 %   Neutrophils Absolute 4.1 1.4 - 7.0 x10E3/uL   Lymphocytes Absolute 1.1 0.7 - 3.1 x10E3/uL   Monocytes Absolute 0.3 0.1 - 0.9 x10E3/uL   EOS (ABSOLUTE) 0.2 0.0 - 0.4 x10E3/uL   Basophils Absolute 0.0 0.0 - 0.2 x10E3/uL   Immature Granulocytes 0 %   Immature Grans (Abs) 0.0 0.0 - 0.1 x10E3/uL  Comp Met (CMET)  Result Value Ref Range   Glucose 108 (H) 65 - 99 mg/dL   BUN 12 8 - 27 mg/dL   Creatinine, Ser 0.75 0.57 - 1.00 mg/dL   GFR calc non Af Amer 77 >59 mL/min/1.73   GFR calc Af Amer 89 >59 mL/min/1.73   BUN/Creatinine Ratio 16 11 - 26   Sodium 142 134 - 144 mmol/L   Potassium 4.3 3.5 - 5.2 mmol/L   Chloride 100 97 - 108 mmol/L   CO2 26 18 - 29 mmol/L   Calcium 9.6 8.7 - 10.3 mg/dL   Total  Protein 8.5 6.0 - 8.5 g/dL   Albumin 4.1 3.5 - 4.8 g/dL   Globulin, Total 4.4 1.5 - 4.5 g/dL   Albumin/Globulin Ratio 0.9 (L) 1.1 - 2.5   Bilirubin Total 0.5 0.0 - 1.2 mg/dL   Alkaline Phosphatase 66 39 - 117 IU/L   AST 17 0 - 40 IU/L   ALT 8 0 - 32 IU/L  Lipid Profile  Result Value Ref Range   Cholesterol, Total 158 100 - 199 mg/dL   Triglycerides 85 0 - 149 mg/dL   HDL 44 >39 mg/dL   VLDL Cholesterol Cal 17 5 - 40 mg/dL   LDL Calculated 97 0 - 99 mg/dL   Chol/HDL Ratio 3.6 0.0 - 4.4 ratio units  TSH  Result Value Ref Range   TSH 6.640 (H) 0.450 - 4.500 uIU/mL  Urinalysis, Routine w reflex microscopic (not at [facility])  Result Value Ref Range   Specific Gravity, UA 1.015 1.005 - 1.030   pH, UA 5.0 5.0 - 7.5   Color, UA Yellow Yellow   Appearance Ur Cloudy (A) Clear   Leukocytes, UA Trace (A) Negative   Protein, UA Negative Negative/Trace   Glucose, UA 3+ (A) Negative   Ketones, UA Negative Negative   RBC, UA 3+ (A) Negative   Bilirubin, UA Negative Negative   Urobilinogen, Ur 0.2 0.2 - 1.0 mg/dL   Nitrite, UA Positive (A) Negative   Microscopic Examination See below:       Assessment & Plan:   Problem List Items Addressed This Visit      Endocrine   Diabetes mellitus ([place])   Relevant Orders   Bayer DCA Hb A1c Waived   Hypothyroidism   Relevant Medications   levothyroxine (SYNTHROID, LEVOTHROID) 75 MCG tablet   Other Relevant Orders   TSH     Other   Urinary incontinence   Relevant Orders   Urinalysis, Routine w reflex microscopic (not at [facility])    Other Visit Diagnoses    Immunization due    -  Primary    Relevant Orders    Flu Vaccine QUAD 36+ mos PF IM (Fluarix & Fluzone Quad PF) (Completed)        Follow up plan: Return in about 3 months (around 05/10/2015) for a1c and U/A.

## 2015-02-08 DIAGNOSIS — E119 Type 2 diabetes mellitus without complications: Secondary | ICD-10-CM | POA: Diagnosis not present

## 2015-02-08 DIAGNOSIS — I951 Orthostatic hypotension: Secondary | ICD-10-CM | POA: Diagnosis not present

## 2015-02-08 DIAGNOSIS — D510 Vitamin B12 deficiency anemia due to intrinsic factor deficiency: Secondary | ICD-10-CM | POA: Diagnosis not present

## 2015-02-08 DIAGNOSIS — I4891 Unspecified atrial fibrillation: Secondary | ICD-10-CM | POA: Diagnosis not present

## 2015-02-08 LAB — TSH: TSH: 5.91 u[IU]/mL — ABNORMAL HIGH (ref 0.450–4.500)

## 2015-02-11 ENCOUNTER — Encounter: Payer: Self-pay | Admitting: Family Medicine

## 2015-02-12 DIAGNOSIS — I951 Orthostatic hypotension: Secondary | ICD-10-CM | POA: Diagnosis not present

## 2015-02-12 DIAGNOSIS — I4891 Unspecified atrial fibrillation: Secondary | ICD-10-CM | POA: Diagnosis not present

## 2015-02-12 DIAGNOSIS — D510 Vitamin B12 deficiency anemia due to intrinsic factor deficiency: Secondary | ICD-10-CM | POA: Diagnosis not present

## 2015-02-12 DIAGNOSIS — E119 Type 2 diabetes mellitus without complications: Secondary | ICD-10-CM | POA: Diagnosis not present

## 2015-02-13 DIAGNOSIS — E119 Type 2 diabetes mellitus without complications: Secondary | ICD-10-CM | POA: Diagnosis not present

## 2015-02-13 DIAGNOSIS — I4891 Unspecified atrial fibrillation: Secondary | ICD-10-CM | POA: Diagnosis not present

## 2015-02-13 DIAGNOSIS — D510 Vitamin B12 deficiency anemia due to intrinsic factor deficiency: Secondary | ICD-10-CM | POA: Diagnosis not present

## 2015-02-13 DIAGNOSIS — I951 Orthostatic hypotension: Secondary | ICD-10-CM | POA: Diagnosis not present

## 2015-02-15 DIAGNOSIS — I4891 Unspecified atrial fibrillation: Secondary | ICD-10-CM | POA: Diagnosis not present

## 2015-02-15 DIAGNOSIS — D510 Vitamin B12 deficiency anemia due to intrinsic factor deficiency: Secondary | ICD-10-CM | POA: Diagnosis not present

## 2015-02-15 DIAGNOSIS — E119 Type 2 diabetes mellitus without complications: Secondary | ICD-10-CM | POA: Diagnosis not present

## 2015-02-15 DIAGNOSIS — I951 Orthostatic hypotension: Secondary | ICD-10-CM | POA: Diagnosis not present

## 2015-02-20 DIAGNOSIS — I951 Orthostatic hypotension: Secondary | ICD-10-CM | POA: Diagnosis not present

## 2015-02-20 DIAGNOSIS — I4891 Unspecified atrial fibrillation: Secondary | ICD-10-CM | POA: Diagnosis not present

## 2015-02-20 DIAGNOSIS — D510 Vitamin B12 deficiency anemia due to intrinsic factor deficiency: Secondary | ICD-10-CM | POA: Diagnosis not present

## 2015-02-20 DIAGNOSIS — E119 Type 2 diabetes mellitus without complications: Secondary | ICD-10-CM | POA: Diagnosis not present

## 2015-02-21 DIAGNOSIS — D510 Vitamin B12 deficiency anemia due to intrinsic factor deficiency: Secondary | ICD-10-CM | POA: Diagnosis not present

## 2015-02-21 DIAGNOSIS — E119 Type 2 diabetes mellitus without complications: Secondary | ICD-10-CM | POA: Diagnosis not present

## 2015-02-21 DIAGNOSIS — I4891 Unspecified atrial fibrillation: Secondary | ICD-10-CM | POA: Diagnosis not present

## 2015-02-21 DIAGNOSIS — I951 Orthostatic hypotension: Secondary | ICD-10-CM | POA: Diagnosis not present

## 2015-02-22 DIAGNOSIS — I951 Orthostatic hypotension: Secondary | ICD-10-CM | POA: Diagnosis not present

## 2015-02-22 DIAGNOSIS — E119 Type 2 diabetes mellitus without complications: Secondary | ICD-10-CM | POA: Diagnosis not present

## 2015-02-22 DIAGNOSIS — D510 Vitamin B12 deficiency anemia due to intrinsic factor deficiency: Secondary | ICD-10-CM | POA: Diagnosis not present

## 2015-02-22 DIAGNOSIS — I4891 Unspecified atrial fibrillation: Secondary | ICD-10-CM | POA: Diagnosis not present

## 2015-02-25 DIAGNOSIS — E119 Type 2 diabetes mellitus without complications: Secondary | ICD-10-CM | POA: Diagnosis not present

## 2015-02-25 DIAGNOSIS — I951 Orthostatic hypotension: Secondary | ICD-10-CM | POA: Diagnosis not present

## 2015-02-25 DIAGNOSIS — D510 Vitamin B12 deficiency anemia due to intrinsic factor deficiency: Secondary | ICD-10-CM | POA: Diagnosis not present

## 2015-02-25 DIAGNOSIS — I4891 Unspecified atrial fibrillation: Secondary | ICD-10-CM | POA: Diagnosis not present

## 2015-02-27 DIAGNOSIS — I4891 Unspecified atrial fibrillation: Secondary | ICD-10-CM | POA: Diagnosis not present

## 2015-02-27 DIAGNOSIS — D510 Vitamin B12 deficiency anemia due to intrinsic factor deficiency: Secondary | ICD-10-CM | POA: Diagnosis not present

## 2015-02-27 DIAGNOSIS — E119 Type 2 diabetes mellitus without complications: Secondary | ICD-10-CM | POA: Diagnosis not present

## 2015-02-27 DIAGNOSIS — I951 Orthostatic hypotension: Secondary | ICD-10-CM | POA: Diagnosis not present

## 2015-03-01 DIAGNOSIS — I4891 Unspecified atrial fibrillation: Secondary | ICD-10-CM | POA: Diagnosis not present

## 2015-03-01 DIAGNOSIS — E119 Type 2 diabetes mellitus without complications: Secondary | ICD-10-CM | POA: Diagnosis not present

## 2015-03-01 DIAGNOSIS — D510 Vitamin B12 deficiency anemia due to intrinsic factor deficiency: Secondary | ICD-10-CM | POA: Diagnosis not present

## 2015-03-01 DIAGNOSIS — I951 Orthostatic hypotension: Secondary | ICD-10-CM | POA: Diagnosis not present

## 2015-03-04 DIAGNOSIS — I4891 Unspecified atrial fibrillation: Secondary | ICD-10-CM | POA: Diagnosis not present

## 2015-03-04 DIAGNOSIS — I951 Orthostatic hypotension: Secondary | ICD-10-CM | POA: Diagnosis not present

## 2015-03-04 DIAGNOSIS — E119 Type 2 diabetes mellitus without complications: Secondary | ICD-10-CM | POA: Diagnosis not present

## 2015-03-04 DIAGNOSIS — D510 Vitamin B12 deficiency anemia due to intrinsic factor deficiency: Secondary | ICD-10-CM | POA: Diagnosis not present

## 2015-03-06 DIAGNOSIS — I951 Orthostatic hypotension: Secondary | ICD-10-CM | POA: Diagnosis not present

## 2015-03-06 DIAGNOSIS — D510 Vitamin B12 deficiency anemia due to intrinsic factor deficiency: Secondary | ICD-10-CM | POA: Diagnosis not present

## 2015-03-06 DIAGNOSIS — E119 Type 2 diabetes mellitus without complications: Secondary | ICD-10-CM | POA: Diagnosis not present

## 2015-03-06 DIAGNOSIS — I4891 Unspecified atrial fibrillation: Secondary | ICD-10-CM | POA: Diagnosis not present

## 2015-03-08 DIAGNOSIS — I951 Orthostatic hypotension: Secondary | ICD-10-CM | POA: Diagnosis not present

## 2015-03-08 DIAGNOSIS — I4891 Unspecified atrial fibrillation: Secondary | ICD-10-CM | POA: Diagnosis not present

## 2015-03-08 DIAGNOSIS — E119 Type 2 diabetes mellitus without complications: Secondary | ICD-10-CM | POA: Diagnosis not present

## 2015-03-08 DIAGNOSIS — D510 Vitamin B12 deficiency anemia due to intrinsic factor deficiency: Secondary | ICD-10-CM | POA: Diagnosis not present

## 2015-03-10 DIAGNOSIS — D510 Vitamin B12 deficiency anemia due to intrinsic factor deficiency: Secondary | ICD-10-CM | POA: Diagnosis not present

## 2015-03-10 DIAGNOSIS — I951 Orthostatic hypotension: Secondary | ICD-10-CM | POA: Diagnosis not present

## 2015-03-10 DIAGNOSIS — I4891 Unspecified atrial fibrillation: Secondary | ICD-10-CM | POA: Diagnosis not present

## 2015-03-10 DIAGNOSIS — E119 Type 2 diabetes mellitus without complications: Secondary | ICD-10-CM | POA: Diagnosis not present

## 2015-03-11 DIAGNOSIS — I951 Orthostatic hypotension: Secondary | ICD-10-CM | POA: Diagnosis not present

## 2015-03-11 DIAGNOSIS — H401133 Primary open-angle glaucoma, bilateral, severe stage: Secondary | ICD-10-CM | POA: Diagnosis not present

## 2015-03-11 DIAGNOSIS — H2513 Age-related nuclear cataract, bilateral: Secondary | ICD-10-CM | POA: Diagnosis not present

## 2015-03-11 DIAGNOSIS — D510 Vitamin B12 deficiency anemia due to intrinsic factor deficiency: Secondary | ICD-10-CM | POA: Diagnosis not present

## 2015-03-11 DIAGNOSIS — I4891 Unspecified atrial fibrillation: Secondary | ICD-10-CM | POA: Diagnosis not present

## 2015-03-11 DIAGNOSIS — E119 Type 2 diabetes mellitus without complications: Secondary | ICD-10-CM | POA: Diagnosis not present

## 2015-03-18 ENCOUNTER — Telehealth: Payer: Self-pay | Admitting: Family Medicine

## 2015-03-18 NOTE — Telephone Encounter (Signed)
As of Jan 18 the pt will no longer receive b12 or any type of assistance through [facility] and they would like to know if she could possibly get oral b12 instead.

## 2015-03-18 NOTE — Telephone Encounter (Signed)
b12

## 2015-03-18 NOTE — Telephone Encounter (Signed)
Patient will have to come in for B12 injections or family can give.  She will try to come in for visit to address need for Home Health

## 2015-04-10 ENCOUNTER — Encounter: Payer: Self-pay | Admitting: *Deleted

## 2015-04-12 ENCOUNTER — Other Ambulatory Visit: Payer: Self-pay | Admitting: Family Medicine

## 2015-04-23 ENCOUNTER — Encounter: Payer: Self-pay | Admitting: Unknown Physician Specialty

## 2015-04-23 ENCOUNTER — Ambulatory Visit (INDEPENDENT_AMBULATORY_CARE_PROVIDER_SITE_OTHER): Payer: Medicare Other | Admitting: Unknown Physician Specialty

## 2015-04-23 VITALS — BP 162/78 | HR 89 | Temp 97.6°F | Ht 62.1 in | Wt 200.0 lb

## 2015-04-23 DIAGNOSIS — I10 Essential (primary) hypertension: Secondary | ICD-10-CM | POA: Diagnosis not present

## 2015-04-23 DIAGNOSIS — I739 Peripheral vascular disease, unspecified: Secondary | ICD-10-CM

## 2015-04-23 DIAGNOSIS — M1990 Unspecified osteoarthritis, unspecified site: Secondary | ICD-10-CM | POA: Insufficient documentation

## 2015-04-23 DIAGNOSIS — E1151 Type 2 diabetes mellitus with diabetic peripheral angiopathy without gangrene: Secondary | ICD-10-CM

## 2015-04-23 DIAGNOSIS — M19042 Primary osteoarthritis, left hand: Secondary | ICD-10-CM

## 2015-04-23 HISTORY — DX: Peripheral vascular disease, unspecified: I73.9

## 2015-04-23 NOTE — Assessment & Plan Note (Signed)
Hgb A1C 6.8 last visit.  Will check in 3 months

## 2015-04-23 NOTE — Patient Instructions (Signed)
f you have chronic pain and are looking for alternatives to medication and surgery, you have a lot of options.   However, not all alternative pain treatments work. Some can even be risky. Some alternative treatments may help with pain from bad backs, osteoarthritis, and headaches, but have no effect on chronic pain from fibromyalgia or diabetic nerve damage.  Here's a rundown of the most commonly used alternative treatments for chronic pain.  Acupuncture. Once seen as bizarre, acupuncture is rapidly becoming a mainstream treatment for pain. Studies have found that it works for pain caused by many conditions, including fibromyalgia, osteoarthritis, back injuries, and sports injuries. How does it work? No one's quite sure. It could release pain-numbing chemicals in the body. Or it might block the pain signals coming from the nerves.  Exercise. Motion is lotion Going for a walk or swim isn't a treatment, exactly. But regular physical activity has big benefits for people with many different painful conditions. Study after study has found that physical activity can help relieve chronic pain, as well as boost energy and mood.   This is particularly true of pain related to arthritis .    Chiropractic manipulation. Although mainstream medicine has traditionally regarded spinal manipulation with suspicion, it's becoming a more accepted treatment.  I think many people respond will th chiropractic treatment.    Supplements and vitamins. There is evidence that certain dietary supplements and vitamins can help with certain types of pain. Fish oil and flaxseed oil is often used to reduce pain associated with swelling. Topical capsaicin, derived from chili peppers, may help with arthritis, diabetic nerve pain, and other conditions. There's evidence that glucosamine can help relieve moderate to severe pain from osteoarthritis in the knee.  A spice Tumeric is used my many to treat chronic pain.  Curcumin is the active  ingredient and thought to have anti-inflammatory effects and reduces pain and stiffness.  This can be found as capsules or extract (more likely to be free of contaminants). For OA: Capsule, typically 400 mg to 600 mg, three times per day; or 0.5 g to 1 g of powdered root up to 3 g per day. For RA: 500 mg twice daily.  It's best absorbed if it contains black pepper.  Tart cherry juice is a natural anti-inflammatory agent.  We sometimes hear from people who would like to know where to find cherries out of season. Some report that their local market does not carry cherry juice. There are a number of reputable online vendors who could supply cherry concentrate so you can use cherry juice to see if it helps your arthritic joints. Studies have been done with powdered [place] cherries, CherryPURE. The usual dose is 480 mg/day.  But when it comes to supplements, you have to be careful. High doses of B6 can damage the nerves.   Some studies suggest that supplements such as ginkgo biloba and ginseng can thin the blood and increase the risk of bleeding. This could lead to serious consequences for anyone getting surgery for chronic pain.  Finally, supplements don't always contain what they claim as supplement manufacturers are not regulated by the FDA.  I get very confused with the thousands of supplements available and which to choose.  Brands to consider: [employer], [employer], [employer], [employer] Life, [employer], [employer] and [employer]   I usually go on Amazon and look for the highly rated products.  [employer] or [employer] are great resources and have done the research   for you.     Cognitive Behavioral Therapy. Some people with chronic pain balk at the idea of seeing a therapist -- they think it implies that their pain isn't real. But studies show that depression and chronic pain often go together. Chronic pain can cause or worsen depression; depression can lower a person's tolerance for  pain.  Stress-reduction techniques. There are number of approaches, including: Yoga. There's good evidence that yoga can help with chronic pain,  specifically fibromyalgia, neck pain, back pain, and arthritis. Relaxation therapy. This is actually a category of techniques that help people calm the body and release tension -- a process that might also reduce pain. Some approaches teach people how to focus on their breathing. Research shows that relaxation therapy can help with fibromyalgia, headache, osteoarthritis, and other conditions. Hypnosis. Studies have found this approach helpful with different sorts of pain, like back pain, repetitive strain injuries, and cancer pain. Guided imagery. Research shows that guided imagery can help with conditions like headache pain, cancer pain, osteoarthritis, and fibromyalgia. How does it work? An expert would teach you ways to direct your thoughts by focusing on specific images. Music therapy. This approach gets people to either perform or listen to music. Studies have found that it can help with many different pain conditions, like osteoarthritis and cancer pain. Biofeedback. This approach teaches you how to control normally unconscious bodily functions, like blood pressure or your heart rate. Studies have found that it can help with headaches, fibromyalgia, and other conditions. Massage. It's undeniably relaxing. And there's some evidence that massage can help ease pain from rheumatoid arthritis, neck and back injuries, and fibromyalgia.  Risky Alternative Pain Treatments Experts say you should keep your expectations for alternative pain treatments modest -- especially when it comes to "miracle cures." Controlling chronic pain is not simple. A single supplement, device, or treatment is not going to make your chronic pain disappear. You a need to be suspicious of anyone pushing a treatment when the financial motive is blatant. That doesn't only apply to pain  treatments advertised on dubious web sites asking for your credit card number.  Experts say that you should try to keep up to date with research into alternative treatments for chronic pain. The options for people with chronic pain are always growing -- and some of the odder treatments of today might become the mainstream treatments of tomorrow.   

## 2015-04-23 NOTE — Assessment & Plan Note (Signed)
Refer to vascular for further managment

## 2015-04-23 NOTE — Progress Notes (Signed)
-  BP 162/78 mmHg  Pulse 89  Temp(Src) 97.6 F (36.4 C)  Ht 5' 2.1" (1.577 m)  Wt 200 lb (90.719 kg)  BMI 36.48 kg/m2  SpO2 98%   Subjective:    Patient ID: Mia Padilla, female    DOB: Jul 04, 1934, 81 y.o.   MRN: [id number]  HPI: Mia Padilla is a 80 y.o. female  Chief Complaint  Patient presents with  . Hyperlipidemia  . Hypertension  . Diabetes  . Cyst    pt states she has a bump on outside of right leg (near ankle) that she sould like looked at  . Pain    pt states she has been having pain in ankles and feet for about month now   Hypertension Using medications without difficulty Average home BPs: not checking   No problems or lightheadedness No chest pain with exertion or shortness of breath No Edema  PVD Pt with tenderness lateral aspect of right leg.  ? Beginning of a pressure ulcer  Hand/wrist pain Very sore at times  Relevant past medical, surgical, family and social history reviewed and updated as indicated. Interim medical history since our last visit reviewed. Allergies and medications reviewed and updated.  Review of Systems  Per HPI unless specifically indicated above     Objective:    BP 162/78 mmHg  Pulse 89  Temp(Src) 97.6 F (36.4 C)  Ht 5' 2.1" (1.577 m)  Wt 200 lb (90.719 kg)  BMI 36.48 kg/m2  SpO2 98%  Wt Readings from Last 3 Encounters:  04/23/15 200 lb (90.719 kg)  02/07/15 192 lb (87.091 kg)  01/09/15 198 lb 4 oz (89.926 kg)    Physical Exam  Constitutional: She is oriented to person, place, and time. She appears well-developed and well-nourished. No distress.  HENT:  Head: Normocephalic and atraumatic.  Eyes: Conjunctivae and lids are normal. Right eye exhibits no discharge. Left eye exhibits no discharge. No scleral icterus.  Neck: Normal range of motion. Neck supple. No JVD present. Carotid bruit is not present.  Cardiovascular: Normal rate, regular rhythm and normal heart sounds.   Pulmonary/Chest: Effort normal and  breath sounds normal.  Abdominal: Normal appearance. There is no splenomegaly or hepatomegaly.  Musculoskeletal: She exhibits edema and tenderness.  Decreased pulses.  Tender and lateral right leg  Neurological: She is alert and oriented to person, place, and time.  Skin: Skin is warm, dry and intact. No rash noted. No pallor.  Psychiatric: She has a normal mood and affect. Her behavior is normal. Judgment and thought content normal.    Assessment & Plan:   Problem List Items Addressed This Visit      Unprioritized   Diabetes mellitus ([place])    Hgb A1C 6.8 last visit.  Will check in 3 months      Relevant Medications   lisinopril (PRINIVIL,ZESTRIL) 10 MG tablet   Other Relevant Orders   Ambulatory referral to Vascular Surgery   Hypertension - Primary    BP has been stable up till now.  Won't make any changes but have a 3 months f/u      Relevant Medications   lisinopril (PRINIVIL,ZESTRIL) 10 MG tablet   metoprolol succinate (TOPROL-XL) 100 MG 24 hr tablet   Peripheral vascular disease ([place])    Refer to vascular for further managment      Relevant Medications   lisinopril (PRINIVIL,ZESTRIL) 10 MG tablet   metoprolol succinate (TOPROL-XL) 100 MG 24 hr tablet   Other Relevant Orders  Ambulatory referral to Vascular Surgery   Osteoarthritis    Alternative treatments to NSAIDS handout given.         No labs today with stable labs last visit.  Did not check urine as possibility for contamination is high  Follow up plan: Return in about 3 months (around 07/22/2015) for with Dr. Jeananne Rama.

## 2015-04-23 NOTE — Assessment & Plan Note (Signed)
BP has been stable up till now.  Won't make any changes but have a 3 months f/u

## 2015-04-23 NOTE — Assessment & Plan Note (Signed)
Alternative treatments to NSAIDS handout given.

## 2015-04-29 DIAGNOSIS — I4891 Unspecified atrial fibrillation: Secondary | ICD-10-CM | POA: Diagnosis not present

## 2015-04-29 DIAGNOSIS — I951 Orthostatic hypotension: Secondary | ICD-10-CM | POA: Diagnosis not present

## 2015-04-29 DIAGNOSIS — E119 Type 2 diabetes mellitus without complications: Secondary | ICD-10-CM | POA: Diagnosis not present

## 2015-04-29 DIAGNOSIS — D510 Vitamin B12 deficiency anemia due to intrinsic factor deficiency: Secondary | ICD-10-CM | POA: Diagnosis not present

## 2015-05-01 DIAGNOSIS — E119 Type 2 diabetes mellitus without complications: Secondary | ICD-10-CM | POA: Diagnosis not present

## 2015-05-01 DIAGNOSIS — I951 Orthostatic hypotension: Secondary | ICD-10-CM | POA: Diagnosis not present

## 2015-05-01 DIAGNOSIS — I4891 Unspecified atrial fibrillation: Secondary | ICD-10-CM | POA: Diagnosis not present

## 2015-05-01 DIAGNOSIS — D510 Vitamin B12 deficiency anemia due to intrinsic factor deficiency: Secondary | ICD-10-CM | POA: Diagnosis not present

## 2015-05-03 DIAGNOSIS — E119 Type 2 diabetes mellitus without complications: Secondary | ICD-10-CM | POA: Diagnosis not present

## 2015-05-03 DIAGNOSIS — I951 Orthostatic hypotension: Secondary | ICD-10-CM | POA: Diagnosis not present

## 2015-05-03 DIAGNOSIS — I4891 Unspecified atrial fibrillation: Secondary | ICD-10-CM | POA: Diagnosis not present

## 2015-05-03 DIAGNOSIS — D510 Vitamin B12 deficiency anemia due to intrinsic factor deficiency: Secondary | ICD-10-CM | POA: Diagnosis not present

## 2015-05-06 DIAGNOSIS — I4891 Unspecified atrial fibrillation: Secondary | ICD-10-CM | POA: Diagnosis not present

## 2015-05-06 DIAGNOSIS — I951 Orthostatic hypotension: Secondary | ICD-10-CM | POA: Diagnosis not present

## 2015-05-06 DIAGNOSIS — E119 Type 2 diabetes mellitus without complications: Secondary | ICD-10-CM | POA: Diagnosis not present

## 2015-05-06 DIAGNOSIS — D510 Vitamin B12 deficiency anemia due to intrinsic factor deficiency: Secondary | ICD-10-CM | POA: Diagnosis not present

## 2015-05-07 DIAGNOSIS — B351 Tinea unguium: Secondary | ICD-10-CM | POA: Diagnosis not present

## 2015-05-07 DIAGNOSIS — E119 Type 2 diabetes mellitus without complications: Secondary | ICD-10-CM | POA: Diagnosis not present

## 2015-05-08 DIAGNOSIS — I4891 Unspecified atrial fibrillation: Secondary | ICD-10-CM | POA: Diagnosis not present

## 2015-05-08 DIAGNOSIS — I951 Orthostatic hypotension: Secondary | ICD-10-CM | POA: Diagnosis not present

## 2015-05-08 DIAGNOSIS — D510 Vitamin B12 deficiency anemia due to intrinsic factor deficiency: Secondary | ICD-10-CM | POA: Diagnosis not present

## 2015-05-08 DIAGNOSIS — E119 Type 2 diabetes mellitus without complications: Secondary | ICD-10-CM | POA: Diagnosis not present

## 2015-05-13 ENCOUNTER — Telehealth: Payer: Self-pay | Admitting: Family Medicine

## 2015-05-13 NOTE — Telephone Encounter (Signed)
Pt says she needs another referral for advanced home care. Says the aide won't be back out until they get a new referral.

## 2015-05-15 DIAGNOSIS — I951 Orthostatic hypotension: Secondary | ICD-10-CM | POA: Diagnosis not present

## 2015-05-15 DIAGNOSIS — I4891 Unspecified atrial fibrillation: Secondary | ICD-10-CM | POA: Diagnosis not present

## 2015-05-15 DIAGNOSIS — D510 Vitamin B12 deficiency anemia due to intrinsic factor deficiency: Secondary | ICD-10-CM | POA: Diagnosis not present

## 2015-05-15 DIAGNOSIS — E119 Type 2 diabetes mellitus without complications: Secondary | ICD-10-CM | POA: Diagnosis not present

## 2015-05-16 NOTE — Telephone Encounter (Signed)
Pt says she needs another referral for advanced home care. Says the aide won't be back out until they get a new referral. Pt says she needs another referral for advanced home care. Says the aide won't be back out until they get a new referral. n

## 2015-05-17 DIAGNOSIS — I951 Orthostatic hypotension: Secondary | ICD-10-CM | POA: Diagnosis not present

## 2015-05-17 DIAGNOSIS — E119 Type 2 diabetes mellitus without complications: Secondary | ICD-10-CM | POA: Diagnosis not present

## 2015-05-17 DIAGNOSIS — D510 Vitamin B12 deficiency anemia due to intrinsic factor deficiency: Secondary | ICD-10-CM | POA: Diagnosis not present

## 2015-05-17 DIAGNOSIS — I4891 Unspecified atrial fibrillation: Secondary | ICD-10-CM | POA: Diagnosis not present

## 2015-05-20 DIAGNOSIS — I951 Orthostatic hypotension: Secondary | ICD-10-CM | POA: Diagnosis not present

## 2015-05-20 DIAGNOSIS — I4891 Unspecified atrial fibrillation: Secondary | ICD-10-CM | POA: Diagnosis not present

## 2015-05-20 DIAGNOSIS — D510 Vitamin B12 deficiency anemia due to intrinsic factor deficiency: Secondary | ICD-10-CM | POA: Diagnosis not present

## 2015-05-20 DIAGNOSIS — E119 Type 2 diabetes mellitus without complications: Secondary | ICD-10-CM | POA: Diagnosis not present

## 2015-05-21 ENCOUNTER — Ambulatory Visit: Payer: Medicare Other | Admitting: Family Medicine

## 2015-05-21 NOTE — Telephone Encounter (Signed)
faxed

## 2015-05-22 DIAGNOSIS — D510 Vitamin B12 deficiency anemia due to intrinsic factor deficiency: Secondary | ICD-10-CM | POA: Diagnosis not present

## 2015-05-22 DIAGNOSIS — I951 Orthostatic hypotension: Secondary | ICD-10-CM | POA: Diagnosis not present

## 2015-05-22 DIAGNOSIS — I4891 Unspecified atrial fibrillation: Secondary | ICD-10-CM | POA: Diagnosis not present

## 2015-05-22 DIAGNOSIS — E119 Type 2 diabetes mellitus without complications: Secondary | ICD-10-CM | POA: Diagnosis not present

## 2015-05-24 DIAGNOSIS — I4891 Unspecified atrial fibrillation: Secondary | ICD-10-CM | POA: Diagnosis not present

## 2015-05-24 DIAGNOSIS — M7989 Other specified soft tissue disorders: Secondary | ICD-10-CM | POA: Diagnosis not present

## 2015-05-24 DIAGNOSIS — D510 Vitamin B12 deficiency anemia due to intrinsic factor deficiency: Secondary | ICD-10-CM | POA: Diagnosis not present

## 2015-05-24 DIAGNOSIS — E785 Hyperlipidemia, unspecified: Secondary | ICD-10-CM | POA: Diagnosis not present

## 2015-05-24 DIAGNOSIS — E119 Type 2 diabetes mellitus without complications: Secondary | ICD-10-CM | POA: Diagnosis not present

## 2015-05-24 DIAGNOSIS — I951 Orthostatic hypotension: Secondary | ICD-10-CM | POA: Diagnosis not present

## 2015-05-27 DIAGNOSIS — E119 Type 2 diabetes mellitus without complications: Secondary | ICD-10-CM | POA: Diagnosis not present

## 2015-05-27 DIAGNOSIS — I4891 Unspecified atrial fibrillation: Secondary | ICD-10-CM | POA: Diagnosis not present

## 2015-05-27 DIAGNOSIS — D510 Vitamin B12 deficiency anemia due to intrinsic factor deficiency: Secondary | ICD-10-CM | POA: Diagnosis not present

## 2015-05-27 DIAGNOSIS — I951 Orthostatic hypotension: Secondary | ICD-10-CM | POA: Diagnosis not present

## 2015-05-28 ENCOUNTER — Encounter: Payer: Self-pay | Admitting: General Surgery

## 2015-05-28 DIAGNOSIS — R921 Mammographic calcification found on diagnostic imaging of breast: Secondary | ICD-10-CM | POA: Diagnosis not present

## 2015-05-28 DIAGNOSIS — C50411 Malignant neoplasm of upper-outer quadrant of right female breast: Secondary | ICD-10-CM | POA: Diagnosis not present

## 2015-05-29 DIAGNOSIS — D510 Vitamin B12 deficiency anemia due to intrinsic factor deficiency: Secondary | ICD-10-CM | POA: Diagnosis not present

## 2015-05-29 DIAGNOSIS — E119 Type 2 diabetes mellitus without complications: Secondary | ICD-10-CM | POA: Diagnosis not present

## 2015-05-29 DIAGNOSIS — I4891 Unspecified atrial fibrillation: Secondary | ICD-10-CM | POA: Diagnosis not present

## 2015-05-29 DIAGNOSIS — I951 Orthostatic hypotension: Secondary | ICD-10-CM | POA: Diagnosis not present

## 2015-05-31 DIAGNOSIS — I951 Orthostatic hypotension: Secondary | ICD-10-CM | POA: Diagnosis not present

## 2015-05-31 DIAGNOSIS — E119 Type 2 diabetes mellitus without complications: Secondary | ICD-10-CM | POA: Diagnosis not present

## 2015-05-31 DIAGNOSIS — D510 Vitamin B12 deficiency anemia due to intrinsic factor deficiency: Secondary | ICD-10-CM | POA: Diagnosis not present

## 2015-05-31 DIAGNOSIS — I4891 Unspecified atrial fibrillation: Secondary | ICD-10-CM | POA: Diagnosis not present

## 2015-06-03 DIAGNOSIS — E119 Type 2 diabetes mellitus without complications: Secondary | ICD-10-CM | POA: Diagnosis not present

## 2015-06-03 DIAGNOSIS — I4891 Unspecified atrial fibrillation: Secondary | ICD-10-CM | POA: Diagnosis not present

## 2015-06-03 DIAGNOSIS — I951 Orthostatic hypotension: Secondary | ICD-10-CM | POA: Diagnosis not present

## 2015-06-03 DIAGNOSIS — D510 Vitamin B12 deficiency anemia due to intrinsic factor deficiency: Secondary | ICD-10-CM | POA: Diagnosis not present

## 2015-06-05 ENCOUNTER — Encounter: Payer: Self-pay | Admitting: Family Medicine

## 2015-06-05 DIAGNOSIS — E119 Type 2 diabetes mellitus without complications: Secondary | ICD-10-CM | POA: Diagnosis not present

## 2015-06-05 DIAGNOSIS — D510 Vitamin B12 deficiency anemia due to intrinsic factor deficiency: Secondary | ICD-10-CM | POA: Diagnosis not present

## 2015-06-05 DIAGNOSIS — I4891 Unspecified atrial fibrillation: Secondary | ICD-10-CM | POA: Diagnosis not present

## 2015-06-05 DIAGNOSIS — I951 Orthostatic hypotension: Secondary | ICD-10-CM | POA: Diagnosis not present

## 2015-06-07 DIAGNOSIS — E119 Type 2 diabetes mellitus without complications: Secondary | ICD-10-CM | POA: Diagnosis not present

## 2015-06-07 DIAGNOSIS — I4891 Unspecified atrial fibrillation: Secondary | ICD-10-CM | POA: Diagnosis not present

## 2015-06-07 DIAGNOSIS — D510 Vitamin B12 deficiency anemia due to intrinsic factor deficiency: Secondary | ICD-10-CM | POA: Diagnosis not present

## 2015-06-07 DIAGNOSIS — I951 Orthostatic hypotension: Secondary | ICD-10-CM | POA: Diagnosis not present

## 2015-06-09 ENCOUNTER — Emergency Department
Admission: EM | Admit: 2015-06-09 | Discharge: 2015-06-09 | Disposition: A | Discharge status: Home / Self Care | Payer: Medicare Other | Attending: Emergency Medicine | Admitting: Emergency Medicine

## 2015-06-09 ENCOUNTER — Encounter: Payer: Self-pay | Admitting: Emergency Medicine

## 2015-06-09 ENCOUNTER — Emergency Department: Payer: Medicare Other

## 2015-06-09 DIAGNOSIS — R531 Weakness: Secondary | ICD-10-CM | POA: Diagnosis not present

## 2015-06-09 DIAGNOSIS — J029 Acute pharyngitis, unspecified: Secondary | ICD-10-CM | POA: Diagnosis not present

## 2015-06-09 DIAGNOSIS — J111 Influenza due to unidentified influenza virus with other respiratory manifestations: Secondary | ICD-10-CM | POA: Diagnosis not present

## 2015-06-09 DIAGNOSIS — Z7982 Long term (current) use of aspirin: Secondary | ICD-10-CM | POA: Insufficient documentation

## 2015-06-09 DIAGNOSIS — E119 Type 2 diabetes mellitus without complications: Secondary | ICD-10-CM | POA: Insufficient documentation

## 2015-06-09 DIAGNOSIS — I10 Essential (primary) hypertension: Secondary | ICD-10-CM | POA: Diagnosis not present

## 2015-06-09 DIAGNOSIS — Z7951 Long term (current) use of inhaled steroids: Secondary | ICD-10-CM | POA: Insufficient documentation

## 2015-06-09 DIAGNOSIS — Z794 Long term (current) use of insulin: Secondary | ICD-10-CM | POA: Diagnosis not present

## 2015-06-09 DIAGNOSIS — Z87891 Personal history of nicotine dependence: Secondary | ICD-10-CM | POA: Diagnosis not present

## 2015-06-09 DIAGNOSIS — Z7902 Long term (current) use of antithrombotics/antiplatelets: Secondary | ICD-10-CM | POA: Insufficient documentation

## 2015-06-09 DIAGNOSIS — R0981 Nasal congestion: Secondary | ICD-10-CM | POA: Diagnosis present

## 2015-06-09 DIAGNOSIS — J101 Influenza due to other identified influenza virus with other respiratory manifestations: Secondary | ICD-10-CM | POA: Diagnosis not present

## 2015-06-09 DIAGNOSIS — R05 Cough: Secondary | ICD-10-CM | POA: Diagnosis not present

## 2015-06-09 LAB — CBC
HCT: 36 % (ref 35.0–47.0)
HEMOGLOBIN: 11.8 g/dL — AB (ref 12.0–16.0)
MCH: 30 pg (ref 26.0–34.0)
MCHC: 32.9 g/dL (ref 32.0–36.0)
MCV: 91.2 fL (ref 80.0–100.0)
PLATELETS: 139 10*3/uL — AB (ref 150–440)
RBC: 3.94 MIL/uL (ref 3.80–5.20)
RDW: 14.7 % — ABNORMAL HIGH (ref 11.5–14.5)
WBC: 4.6 10*3/uL (ref 3.6–11.0)

## 2015-06-09 LAB — POCT RAPID STREP A: STREPTOCOCCUS, GROUP A SCREEN (DIRECT): NEGATIVE

## 2015-06-09 LAB — BASIC METABOLIC PANEL
ANION GAP: 10 (ref 5–15)
BUN: 15 mg/dL (ref 6–20)
CALCIUM: 8.4 mg/dL — AB (ref 8.9–10.3)
CO2: 26 mmol/L (ref 22–32)
CREATININE: 0.68 mg/dL (ref 0.44–1.00)
Chloride: 98 mmol/L — ABNORMAL LOW (ref 101–111)
Glucose, Bld: 174 mg/dL — ABNORMAL HIGH (ref 65–99)
Potassium: 3.7 mmol/L (ref 3.5–5.1)
SODIUM: 134 mmol/L — AB (ref 135–145)

## 2015-06-09 LAB — RAPID INFLUENZA A&B ANTIGENS (ARMC ONLY): INFLUENZA A (ARMC): NOT DETECTED

## 2015-06-09 LAB — RAPID INFLUENZA A&B ANTIGENS: Influenza B (ARMC): DETECTED

## 2015-06-09 MED ORDER — SODIUM CHLORIDE 0.9 % IV SOLN
1000.0000 mL | Freq: Once | INTRAVENOUS | Status: AC
  Administered 2015-06-09: 1000 mL via INTRAVENOUS

## 2015-06-09 MED ORDER — IPRATROPIUM-ALBUTEROL 0.5-2.5 (3) MG/3ML IN SOLN
3.0000 mL | Freq: Once | RESPIRATORY_TRACT | Status: AC
  Administered 2015-06-09: 3 mL via RESPIRATORY_TRACT
  Filled 2015-06-09 (×2): qty 3

## 2015-06-09 MED ORDER — OSELTAMIVIR PHOSPHATE 75 MG PO CAPS
75.0000 mg | ORAL_CAPSULE | Freq: Once | ORAL | Status: AC
  Administered 2015-06-09: 75 mg via ORAL
  Filled 2015-06-09: qty 1

## 2015-06-09 MED ORDER — OSELTAMIVIR PHOSPHATE 75 MG PO CAPS: 75.0000 mg | ORAL_CAPSULE | Freq: Two times a day (BID) | ORAL | Status: AC

## 2015-06-09 NOTE — ED Notes (Signed)
Patient reports having URI that started on Saturday. Has been taking Robitussin for symptoms with little relief. Patient reports sore throat, cough with productive sputum. Epistaxis reported x1. Patient reports increased fatigue and weakness in last 24 hours.

## 2015-06-09 NOTE — ED Provider Notes (Signed)
[facility] Emergency Department Provider Note  ____________________________________________    I have reviewed the triage vital signs and the nursing notes.   HISTORY  Chief Complaint Nasal Congestion and Weakness    HPI Mia Padilla is a 81 y.o. female who presents with cough, weakness, nasal congestion and sore throat which started apparently yesterday. No fevers reported. Patient denies shortness of breath. She denies chest pain. She denies headache. Her primary complaint is her cough and her sore throat     Past Medical History  Diagnosis Date  . Hypotension   . History of seizures   . Obesity   . Debilitated   . Hypertension   . Hyperlipidemia   . Bowel incontinence   . Urinary incontinence   . Glaucoma   . Black head   . Arthritis     hands  . Carpal tunnel syndrome   . Breast cancer of upper-outer quadrant of left female breast [facility]) 2015    Patient Active Problem List   Diagnosis Date Noted  . Peripheral vascular disease ([place]) 04/23/2015  . Osteoarthritis 04/23/2015  . Hypothyroidism 02/07/2015  . Urinary incontinence 02/07/2015  . Breast cancer ([place]) 11/15/2013  . Malignant neoplasm of upper-outer quadrant of female breast ([place]) 06/28/2013  . Diastolic CHF ([place]) 97/98/9211  . Hypertension 10/15/2011  . A-fib ([place]) 06/04/2011  . COPD (chronic obstructive pulmonary disease) ([place]) 06/04/2011  . Obese 06/04/2011  . Seizures ([place]) 06/04/2011  . CVA (cerebral infarction) 06/04/2011  . Diabetes mellitus ([place]) 06/04/2011    Past Surgical History  Procedure Laterality Date  . Partial hysterectomy    . Tonsillectomy    . Umbilical hernia repair    . Carpal tunnel release Right 30 years   . Bowel resection  2005  . Breast surgery  08/03/13    left mastectomy  . Mastectomy      left     Current Outpatient Rx  Name  Route  Sig  Dispense  Refill  . acetaminophen (TYLENOL) 500 MG tablet   Oral   Take 500 mg by mouth every 6  (six) hours as needed.         Marland Kitchen albuterol (PROVENTIL HFA;VENTOLIN HFA) 108 (90 BASE) MCG/ACT inhaler   Inhalation   Inhale 2 puffs into the lungs every 6 (six) hours as needed.         Marland Kitchen alendronate (FOSAMAX) 70 MG tablet   Oral   Take 70 mg by mouth once a week. Take with a full glass of water on an empty stomach.         Marland Kitchen anastrozole (ARIMIDEX) 1 MG tablet   Oral   Take 1 tablet (1 mg total) by mouth daily.   30 tablet   12   . aspirin EC 81 MG tablet   Oral   Take 81 mg by mouth daily.         . blood glucose meter kit and supplies      Diagnosis:E11.9 Test glucose twice daily   1 each   12   . brimonidine (ALPHAGAN P) 0.1 % SOLN      2 (two) times daily.          . Canagliflozin (INVOKANA) 100 MG TABS   Oral   Take 100 mg by mouth daily.         . clopidogrel (PLAVIX) 75 MG tablet   Oral   Take 75 mg by mouth daily.         Marland Kitchen  cyanocobalamin (,VITAMIN B-12,) 1000 MCG/ML injection   Intramuscular   Inject 1 mL (1,000 mcg total) into the muscle every 30 (thirty) days.   1 mL   12   . digoxin (LANOXIN) 0.125 MG tablet      TAKE 1 TABLET BY MOUTH EVERYDAY   30 tablet   0   . dorzolamide (TRUSOPT) 2 % ophthalmic solution      1 drop 2 (two) times daily.          . fluticasone (FLONASE) 50 MCG/ACT nasal spray   Nasal   Place 2 sprays into the nose daily.         . Fluticasone-Salmeterol (ADVAIR) 250-50 MCG/DOSE AEPB   Inhalation   Inhale 1 puff into the lungs every 12 (twelve) hours.         . furosemide (LASIX) 20 MG tablet   Oral   Take 1 tablet (20 mg total) by mouth daily as needed.   30 tablet   6   . glucose blood test strip      Use as instructed   100 each   12     Test 1 time a day Dx is DM  2   . insulin detemir (LEVEMIR) 100 UNIT/ML injection   Subcutaneous   Inject 26 Units into the skin at bedtime.          Marland Kitchen levothyroxine (SYNTHROID, LEVOTHROID) 75 MCG tablet   Oral   Take 1 tablet (75 mcg total) by  mouth daily.   30 tablet   12   . EXPIRED: lisinopril (PRINIVIL,ZESTRIL) 10 MG tablet   Oral   Take 1 tablet (10 mg total) by mouth daily.   30 tablet   6   . lisinopril (PRINIVIL,ZESTRIL) 10 MG tablet   Oral   Take 10 mg by mouth daily.      1   . meclizine (ANTIVERT) 25 MG tablet   Oral   Take 25 mg by mouth 3 (three) times daily as needed.         . metFORMIN (GLUCOPHAGE) 500 MG tablet   Oral   Take 500 mg by mouth 2 (two) times daily with a meal.         . EXPIRED: metoprolol succinate (TOPROL-XL) 100 MG 24 hr tablet   Oral   Take 100 mg by mouth daily.          . metoprolol succinate (TOPROL-XL) 100 MG 24 hr tablet   Oral   Take 100 mg by mouth daily.      1   . mometasone (ELOCON) 0.1 % cream   Topical   Apply 1 application topically daily.   45 g   1   . mupirocin ointment (BACTROBAN) 2 %   Nasal   Place 1 application into the nose 2 (two) times daily.   22 g   2   . Olopatadine HCl (PATADAY) 0.2 % SOLN   Ophthalmic   Apply to eye daily.         . potassium chloride (K-DUR) 10 MEQ tablet   Oral   Take 1 tablet (10 mEq total) by mouth daily as needed.   30 tablet   6   . solifenacin (VESICARE) 5 MG tablet   Oral   Take 10 mg by mouth daily.         . travoprost, benzalkonium, (TRAVATAN) 0.004 % ophthalmic solution   Ophthalmic   Apply 1 drop to eye at bedtime.  Allergies Darvocet and Percocet  Family History  Problem Relation Age of Onset  . Cervical cancer Mother   . Diabetes Mother   . Glaucoma Mother   . Cervical cancer Daughter   . Hypertension Daughter   . Hypertension Father   . Hypertension Sister   . Hypertension Brother   . Hypertension Son   . Diabetes Brother   . HIV Brother     Social History Social History  Substance Use Topics  . Smoking status: Former Games developer  . Smokeless tobacco: Never Used  . Alcohol Use: No    Review of Systems  Constitutional: Negative for fever. Eyes: Negative  for visual changes. ENT: Positive sore throat, positive congestion Cardiovascular: Negative for chest pain. Respiratory: Negative for shortness of breath. Positive cough Gastrointestinal: Negative for abdominal pain Genitourinary: Negative for dysuria. Musculoskeletal: Negative for back pain. Skin: Negative for rash. Neurological: Negative for headaches  Psychiatric: No anxiety    ____________________________________________   PHYSICAL EXAM:  VITAL SIGNS: ED Triage Vitals  Enc Vitals Group     BP 06/09/15 1303 154/63 mmHg     Pulse Rate 06/09/15 1303 119     Resp 06/09/15 1303 18     Temp 06/09/15 1303 99.5 F (37.5 C)     Temp Source 06/09/15 1303 Oral     SpO2 06/09/15 1303 95 %     Weight 06/09/15 1303 193 lb (87.544 kg)     Height 06/09/15 1303 5\' 6"  (1.676 m)     Head Cir --      Peak Flow --      Pain Score 06/09/15 1312 10     Pain Loc --      Pain Edu? --      Excl. in GC? --      Constitutional: Alert and oriented. Well appearing and in no distress. Eyes: Conjunctivae are normal.  ENT   Head: Normocephalic and atraumatic.   Mouth/Throat: Mucous membranes are moist. Pharynx is normal Cardiovascular: Normal rate, regular rhythm. Normal and symmetric distal pulses are present in all extremities.  Respiratory: Normal respiratory effort without tachypnea nor retractions. Wheezes bilaterally, mild Gastrointestinal: Soft and non-tender in all quadrants. No distention. There is no CVA tenderness. Genitourinary: deferred Musculoskeletal: Nontender with normal range of motion in all extremities. No lower extremity tenderness nor edema. Neurologic:  Normal speech and language. No gross focal neurologic deficits are appreciated. Skin:  Skin is warm, dry and intact. No rash noted. Psychiatric: Mood and affect are normal. Patient exhibits appropriate insight and judgment.  ____________________________________________    LABS (pertinent  positives/negatives)  Labs Reviewed  BASIC METABOLIC PANEL - Abnormal; Notable for the following:    Sodium 134 (*)    Chloride 98 (*)    Glucose, Bld 174 (*)    Calcium 8.4 (*)    All other components within normal limits  CBC - Abnormal; Notable for the following:    Hemoglobin 11.8 (*)    RDW 14.7 (*)    Platelets 139 (*)    All other components within normal limits  RAPID INFLUENZA A&B ANTIGENS ([facility] ONLY)  POCT RAPID STREP A    ____________________________________________   EKG  ED ECG REPORT I, 06/11/15, the attending physician, personally viewed and interpreted this ECG.  Date: 06/09/2015 EKG Time: 1:24 PM Rate: 122 Rhythm: Sinus tachycardia QRS Axis: normal Intervals: normal ST/T Wave abnormalities: normal Conduction Disturbances: none Narrative Interpretation: unremarkable   ____________________________________________    RADIOLOGY I have personally reviewed  any xrays that were ordered on this patient: Chest x-ray unremarkable  ____________________________________________   PROCEDURES  Procedure(s) performed: none  Critical Care performed: none  ____________________________________________   INITIAL IMPRESSION / ASSESSMENT AND PLAN / ED COURSE  Pertinent labs & imaging results that were available during my care of the patient were reviewed by me and considered in my medical decision making (see chart for details).  Patient presents with mild tachycardia in history of present illness consistent with upper respiratory syndrome. She does appear to have some wheezing bilaterally, we will give DuoNeb, cautiously give fluids, check influenza and strep swabs and reevaluate   ----------------------------------------- 6:08 PM on 06/09/2015 -----------------------------------------  Influenza B-positive. This is consistent with her symptoms. We have given her IV fluids and her heart rate has improved. She is resting comfortably in her bed. She  is not tachypneic and is not having any shortness of breath. Her labs are reassuring. We have given her Tamiflu in the emergency department. I discussed with her granddaughter and we agreed that patient would be most comfortable at  home. Encouraged increased fluid intake. If any worsening of symptoms granddaughter will bring the patient back for admission ____________________________________________   FINAL CLINICAL IMPRESSION(S) / ED DIAGNOSES  Final diagnoses:  Influenza B     Lavonia Drafts, MD 06/09/15 2020

## 2015-06-09 NOTE — ED Notes (Signed)
Lab results and CXR report reviewed.

## 2015-06-09 NOTE — Discharge Instructions (Signed)

## 2015-06-09 NOTE — ED Notes (Signed)
Discussed discharge instructions, prescriptions, and follow-up care with patient. No questions or concerns at this time. Pt stable at discharge.  

## 2015-06-11 ENCOUNTER — Ambulatory Visit
Admission: RE | Admit: 2015-06-11 | Discharge: 2015-06-11 | Disposition: A | Discharge status: Home / Self Care | Payer: Medicare Other | Source: Ambulatory Visit | Attending: Family Medicine | Admitting: Family Medicine

## 2015-06-11 ENCOUNTER — Ambulatory Visit: Payer: Medicare Other | Admitting: General Surgery

## 2015-06-11 ENCOUNTER — Encounter: Payer: Self-pay | Admitting: Family Medicine

## 2015-06-11 ENCOUNTER — Ambulatory Visit (INDEPENDENT_AMBULATORY_CARE_PROVIDER_SITE_OTHER): Payer: Medicare Other | Admitting: Family Medicine

## 2015-06-11 ENCOUNTER — Ambulatory Visit: Payer: Medicare Other | Admitting: Family Medicine

## 2015-06-11 ENCOUNTER — Telehealth: Payer: Self-pay | Admitting: Family Medicine

## 2015-06-11 VITALS — BP 118/67 | HR 116 | Temp 99.5°F | Wt 196.0 lb

## 2015-06-11 DIAGNOSIS — R05 Cough: Secondary | ICD-10-CM | POA: Diagnosis not present

## 2015-06-11 DIAGNOSIS — I517 Cardiomegaly: Secondary | ICD-10-CM | POA: Insufficient documentation

## 2015-06-11 DIAGNOSIS — R062 Wheezing: Secondary | ICD-10-CM

## 2015-06-11 DIAGNOSIS — J101 Influenza due to other identified influenza virus with other respiratory manifestations: Secondary | ICD-10-CM

## 2015-06-11 DIAGNOSIS — J189 Pneumonia, unspecified organism: Secondary | ICD-10-CM | POA: Insufficient documentation

## 2015-06-11 DIAGNOSIS — J441 Chronic obstructive pulmonary disease with (acute) exacerbation: Secondary | ICD-10-CM

## 2015-06-11 MED ORDER — ALBUTEROL SULFATE (2.5 MG/3ML) 0.083% IN NEBU: 2.5000 mg | INHALATION_SOLUTION | Freq: Once | RESPIRATORY_TRACT | Status: DC

## 2015-06-11 MED ORDER — PREDNISONE 10 MG PO TABS: ORAL_TABLET | ORAL | Status: DC

## 2015-06-11 MED ORDER — DOXYCYCLINE HYCLATE 100 MG PO TABS: 100.0000 mg | ORAL_TABLET | Freq: Two times a day (BID) | ORAL | Status: DC

## 2015-06-11 NOTE — Telephone Encounter (Signed)
Called to let her know that she does have pneumonia. Following up on Friday. If not significantly better, will change from doxy to levaquin. Monitor closely.

## 2015-06-11 NOTE — Progress Notes (Signed)
BP 118/67 mmHg  Pulse 116  Temp(Src) 99.5 F (37.5 C)  Ht   Wt 196 lb (88.905 kg)  SpO2 96%   Subjective:    Patient ID: Mia Padilla, female    DOB: 1934-07-29, 81 y.o.   MRN: [id number]  HPI: Mia Padilla is a 81 y.o. female  Chief Complaint  Patient presents with  . Influenza   ER FOLLOW UP- went to ER with URI with wheezes, and tachycardia. Given some fluids and a neb and tested positive for flu. Started on tamiflu, has been on it for 3 days now.  Time since discharge: 2 days Hospital/facility: [facility] Diagnosis: Influenza B Procedures/tests: rapid flu Consultants: None New medications: Tamiflu Discharge instructions:  Follow up here Status: better  Relevant past medical, surgical, family and social history reviewed and updated as indicated. Interim medical history since our last visit reviewed. Allergies and medications reviewed and updated.  Review of Systems  Constitutional: Negative.   HENT: Negative.   Respiratory: Negative.   Cardiovascular: Negative.   Psychiatric/Behavioral: Negative.     Per HPI unless specifically indicated above     Objective:    BP 118/67 mmHg  Pulse 116  Temp(Src) 99.5 F (37.5 C)  Ht   Wt 196 lb (88.905 kg)  SpO2 96%  Wt Readings from Last 3 Encounters:  06/11/15 196 lb (88.905 kg)  06/09/15 193 lb (87.544 kg)  04/23/15 200 lb (90.719 kg)    Physical Exam  Constitutional: She is oriented to person, place, and time. She appears well-developed and well-nourished. No distress.  HENT:  Head: Normocephalic and atraumatic.  Right Ear: Hearing normal.  Left Ear: Hearing normal.  Nose: Nose normal.  Eyes: Conjunctivae and lids are normal. Right eye exhibits no discharge. Left eye exhibits no discharge. No scleral icterus.  Cardiovascular: Regular rhythm, normal heart sounds and intact distal pulses.  Exam reveals no gallop and no friction rub.   No murmur heard. Pulmonary/Chest: Effort normal. No respiratory distress. She has  wheezes in the right upper field, the right middle field, the right lower field, the left upper field, the left middle field and the left lower field. She has rhonchi in the left lower field. She has no rales. She exhibits no tenderness.  Musculoskeletal: Normal range of motion.  Neurological: She is alert and oriented to person, place, and time.  Skin: Skin is warm and intact. No rash noted. She is diaphoretic. No erythema. No pallor.  Psychiatric: She has a normal mood and affect. Her speech is normal and behavior is normal. Judgment and thought content normal. Cognition and memory are normal.  Nursing note and vitals reviewed.   Results for orders placed or performed during the hospital encounter of 06/09/15  Rapid Influenza A&B Antigens [facility] only)  Result Value Ref Range   Influenza A [facility]) NOT DETECTED    Influenza B [facility]) DETECTED   Basic metabolic panel  Result Value Ref Range   Sodium 134 (L) 135 - 145 mmol/L   Potassium 3.7 3.5 - 5.1 mmol/L   Chloride 98 (L) 101 - 111 mmol/L   CO2 26 22 - 32 mmol/L   Glucose, Bld 174 (H) 65 - 99 mg/dL   BUN 15 6 - 20 mg/dL   Creatinine, Ser 0.68 0.44 - 1.00 mg/dL   Calcium 8.4 (L) 8.9 - 10.3 mg/dL   GFR calc non Af Amer >60 >60 mL/min   GFR calc Af Amer >60 >60 mL/min   Anion gap 10  5 - 15  CBC  Result Value Ref Range   WBC 4.6 3.6 - 11.0 K/uL   RBC 3.94 3.80 - 5.20 MIL/uL   Hemoglobin 11.8 (L) 12.0 - 16.0 g/dL   HCT 36.0 35.0 - 47.0 %   MCV 91.2 80.0 - 100.0 fL   MCH 30.0 26.0 - 34.0 pg   MCHC 32.9 32.0 - 36.0 g/dL   RDW 14.7 (H) 11.5 - 14.5 %   Platelets 139 (L) 150 - 440 K/uL  POCT rapid strep A [facility])  Result Value Ref Range   Streptococcus, Group A Screen (Direct) NEGATIVE NEGATIVE      Assessment & Plan:   Problem List Items Addressed This Visit    None    Visit Diagnoses    COPD exacerbation ([place])    -  Primary    Will treat with prednisone and doxy and recheck in 3-4 days. Obtaining x-ray today to r/o  CHF. Continue to monitor closely.     Relevant Medications    albuterol (PROVENTIL) (2.5 MG/3ML) 0.083% nebulizer solution 2.5 mg    predniSONE (DELTASONE) 10 MG tablet    Wheezing        Concern for pneumonia/COPD exacerbation. Will do neb and recheck lungs. Only slightly better following neb.    Relevant Medications    albuterol (PROVENTIL) (2.5 MG/3ML) 0.083% nebulizer solution 2.5 mg    Other Relevant Orders    DG Chest 2 View    Influenza B        Doing better on tamiflu. Continue to monitor. Will get home health in for flu follow up.     Relevant Orders    DG Chest 2 View        Follow up plan: Return End of the week, for Lung recheck.

## 2015-06-12 DIAGNOSIS — E119 Type 2 diabetes mellitus without complications: Secondary | ICD-10-CM | POA: Diagnosis not present

## 2015-06-12 DIAGNOSIS — I4891 Unspecified atrial fibrillation: Secondary | ICD-10-CM | POA: Diagnosis not present

## 2015-06-12 DIAGNOSIS — D510 Vitamin B12 deficiency anemia due to intrinsic factor deficiency: Secondary | ICD-10-CM | POA: Diagnosis not present

## 2015-06-12 DIAGNOSIS — I951 Orthostatic hypotension: Secondary | ICD-10-CM | POA: Diagnosis not present

## 2015-06-13 ENCOUNTER — Telehealth: Payer: Self-pay | Admitting: Family Medicine

## 2015-06-13 MED ORDER — BENZONATATE 100 MG PO CAPS: 100.0000 mg | ORAL_CAPSULE | Freq: Two times a day (BID) | ORAL | Status: DC | PRN

## 2015-06-13 NOTE — Telephone Encounter (Signed)
Call pt-  rx sent in 

## 2015-06-13 NOTE — Telephone Encounter (Signed)
Patient has been coughing a lot has an appt with Dr. Wynetta Emery tomorrow but she feels that she needs a medication for diabetes patient that can help her with her coughing, please call patient. [phone]

## 2015-06-14 ENCOUNTER — Ambulatory Visit (INDEPENDENT_AMBULATORY_CARE_PROVIDER_SITE_OTHER): Payer: Medicare Other | Admitting: Family Medicine

## 2015-06-14 ENCOUNTER — Encounter: Payer: Self-pay | Admitting: Family Medicine

## 2015-06-14 VITALS — BP 147/78 | HR 76 | Temp 98.7°F

## 2015-06-14 DIAGNOSIS — D510 Vitamin B12 deficiency anemia due to intrinsic factor deficiency: Secondary | ICD-10-CM | POA: Diagnosis not present

## 2015-06-14 DIAGNOSIS — E119 Type 2 diabetes mellitus without complications: Secondary | ICD-10-CM | POA: Diagnosis not present

## 2015-06-14 DIAGNOSIS — I951 Orthostatic hypotension: Secondary | ICD-10-CM | POA: Diagnosis not present

## 2015-06-14 DIAGNOSIS — J189 Pneumonia, unspecified organism: Secondary | ICD-10-CM

## 2015-06-14 DIAGNOSIS — I4891 Unspecified atrial fibrillation: Secondary | ICD-10-CM | POA: Diagnosis not present

## 2015-06-14 DIAGNOSIS — J181 Lobar pneumonia, unspecified organism: Principal | ICD-10-CM

## 2015-06-14 MED ORDER — ALBUTEROL SULFATE HFA 108 (90 BASE) MCG/ACT IN AERS: 2.0000 | INHALATION_SPRAY | Freq: Four times a day (QID) | RESPIRATORY_TRACT | Status: DC | PRN

## 2015-06-14 MED ORDER — LEVOFLOXACIN 500 MG PO TABS: 500.0000 mg | ORAL_TABLET | Freq: Every day | ORAL | Status: DC

## 2015-06-14 NOTE — Progress Notes (Signed)
BP 147/78 mmHg  Pulse 76  Temp(Src) 98.7 F (37.1 C)  SpO2 98%   Subjective:    Patient ID: Mia Padilla, female    DOB: [birth year], 81 y.o.   MRN: [id number]  HPI: Mia Padilla is a 81 y.o. female  Chief Complaint  Patient presents with  . lung recheck   She is feeling much better. Breathing is easier. Feels like the medication is working. No more fevers. No chills. Still coughing a lot. Still very tired. No other concerns or complaints at this time.   Relevant past medical, surgical, family and social history reviewed and updated as indicated. Interim medical history since our last visit reviewed. Allergies and medications reviewed and updated.  Review of Systems  Constitutional: Negative.   HENT: Negative.   Respiratory: Positive for cough, shortness of breath and wheezing. Negative for apnea, choking, chest tightness and stridor.   Cardiovascular: Negative.   Psychiatric/Behavioral: Negative.     Per HPI unless specifically indicated above     Objective:    BP 147/78 mmHg  Pulse 76  Temp(Src) 98.7 F (37.1 C)  SpO2 98%  Wt Readings from Last 3 Encounters:  06/11/15 196 lb (88.905 kg)  06/09/15 193 lb (87.544 kg)  04/23/15 200 lb (90.719 kg)    Physical Exam  Constitutional: She is oriented to person, place, and time. She appears well-developed and well-nourished. No distress.  HENT:  Head: Normocephalic and atraumatic.  Right Ear: Hearing normal.  Left Ear: Hearing normal.  Nose: Nose normal.  Eyes: Conjunctivae and lids are normal. Right eye exhibits no discharge. Left eye exhibits no discharge. No scleral icterus.  Cardiovascular: Normal rate, regular rhythm, normal heart sounds and intact distal pulses.  Exam reveals no gallop and no friction rub.   No murmur heard. Pulmonary/Chest: Effort normal. No respiratory distress. She has wheezes in the right upper field, the right middle field, the right lower field, the left upper field, the left middle field and  the left lower field. She has rhonchi in the right middle field, the right lower field, the left middle field and the left lower field. She has no rales. She exhibits no tenderness.  Musculoskeletal: Normal range of motion.  Neurological: She is alert and oriented to person, place, and time.  Skin: Skin is warm, dry and intact. No rash noted. No erythema. No pallor.  Psychiatric: She has a normal mood and affect. Her speech is normal and behavior is normal. Judgment and thought content normal. Cognition and memory are normal.  Nursing note and vitals reviewed.   Results for orders placed or performed during the hospital encounter of 06/09/15  Rapid Influenza A&B Antigens [facility] only)  Result Value Ref Range   Influenza A [facility]) NOT DETECTED    Influenza B [facility]) DETECTED   Basic metabolic panel  Result Value Ref Range   Sodium 134 (L) 135 - 145 mmol/L   Potassium 3.7 3.5 - 5.1 mmol/L   Chloride 98 (L) 101 - 111 mmol/L   CO2 26 22 - 32 mmol/L   Glucose, Bld 174 (H) 65 - 99 mg/dL   BUN 15 6 - 20 mg/dL   Creatinine, Ser 0.68 0.44 - 1.00 mg/dL   Calcium 8.4 (L) 8.9 - 10.3 mg/dL   GFR calc non Af Amer >60 >60 mL/min   GFR calc Af Amer >60 >60 mL/min   Anion gap 10 5 - 15  CBC  Result Value Ref Range   WBC 4.6 3.6 -  11.0 K/uL   RBC 3.94 3.80 - 5.20 MIL/uL   Hemoglobin 11.8 (L) 12.0 - 16.0 g/dL   HCT 36.0 35.0 - 47.0 %   MCV 91.2 80.0 - 100.0 fL   MCH 30.0 26.0 - 34.0 pg   MCHC 32.9 32.0 - 36.0 g/dL   RDW 14.7 (H) 11.5 - 14.5 %   Platelets 139 (L) 150 - 440 K/uL  POCT rapid strep A [facility])  Result Value Ref Range   Streptococcus, Group A Screen (Direct) NEGATIVE NEGATIVE      Assessment & Plan:   Problem List Items Addressed This Visit    None    Visit Diagnoses    RLL pneumonia    -  Primary    Not significantly better. Will change to levoquin. Continue other treatment. Recheck 1 week with x-ray before.     Relevant Medications    albuterol (PROVENTIL HFA;VENTOLIN  HFA) 108 (90 Base) MCG/ACT inhaler    levofloxacin (LEVAQUIN) 500 MG tablet    Other Relevant Orders    DG Chest 2 View        Follow up plan: Return in about 1 week (around 06/21/2015) for lung check.

## 2015-06-17 DIAGNOSIS — E119 Type 2 diabetes mellitus without complications: Secondary | ICD-10-CM | POA: Diagnosis not present

## 2015-06-17 DIAGNOSIS — I951 Orthostatic hypotension: Secondary | ICD-10-CM | POA: Diagnosis not present

## 2015-06-17 DIAGNOSIS — D510 Vitamin B12 deficiency anemia due to intrinsic factor deficiency: Secondary | ICD-10-CM | POA: Diagnosis not present

## 2015-06-17 DIAGNOSIS — I4891 Unspecified atrial fibrillation: Secondary | ICD-10-CM | POA: Diagnosis not present

## 2015-06-19 DIAGNOSIS — D510 Vitamin B12 deficiency anemia due to intrinsic factor deficiency: Secondary | ICD-10-CM | POA: Diagnosis not present

## 2015-06-19 DIAGNOSIS — I951 Orthostatic hypotension: Secondary | ICD-10-CM | POA: Diagnosis not present

## 2015-06-19 DIAGNOSIS — E119 Type 2 diabetes mellitus without complications: Secondary | ICD-10-CM | POA: Diagnosis not present

## 2015-06-19 DIAGNOSIS — I4891 Unspecified atrial fibrillation: Secondary | ICD-10-CM | POA: Diagnosis not present

## 2015-06-20 ENCOUNTER — Ambulatory Visit
Admission: RE | Admit: 2015-06-20 | Discharge: 2015-06-20 | Disposition: A | Discharge status: Home / Self Care | Payer: Medicare Other | Source: Ambulatory Visit | Attending: Family Medicine | Admitting: Family Medicine

## 2015-06-20 DIAGNOSIS — J189 Pneumonia, unspecified organism: Secondary | ICD-10-CM | POA: Insufficient documentation

## 2015-06-20 DIAGNOSIS — R05 Cough: Secondary | ICD-10-CM | POA: Diagnosis not present

## 2015-06-20 DIAGNOSIS — R0602 Shortness of breath: Secondary | ICD-10-CM | POA: Diagnosis not present

## 2015-06-20 DIAGNOSIS — J181 Lobar pneumonia, unspecified organism: Principal | ICD-10-CM

## 2015-06-21 ENCOUNTER — Encounter: Payer: Self-pay | Admitting: Family Medicine

## 2015-06-21 ENCOUNTER — Ambulatory Visit (INDEPENDENT_AMBULATORY_CARE_PROVIDER_SITE_OTHER): Payer: Medicare Other | Admitting: Family Medicine

## 2015-06-21 VITALS — BP 145/73 | HR 103 | Temp 97.5°F | Wt 200.0 lb

## 2015-06-21 DIAGNOSIS — J181 Lobar pneumonia, unspecified organism: Principal | ICD-10-CM

## 2015-06-21 DIAGNOSIS — J189 Pneumonia, unspecified organism: Secondary | ICD-10-CM | POA: Diagnosis not present

## 2015-06-21 DIAGNOSIS — D510 Vitamin B12 deficiency anemia due to intrinsic factor deficiency: Secondary | ICD-10-CM | POA: Diagnosis not present

## 2015-06-21 DIAGNOSIS — E119 Type 2 diabetes mellitus without complications: Secondary | ICD-10-CM | POA: Diagnosis not present

## 2015-06-21 DIAGNOSIS — I951 Orthostatic hypotension: Secondary | ICD-10-CM | POA: Diagnosis not present

## 2015-06-21 DIAGNOSIS — I4891 Unspecified atrial fibrillation: Secondary | ICD-10-CM | POA: Diagnosis not present

## 2015-06-21 MED ORDER — MECLIZINE HCL 25 MG PO TABS
25.0000 mg | ORAL_TABLET | Freq: Three times a day (TID) | ORAL | Status: DC | PRN
Start: 2015-06-21 — End: 2015-08-26

## 2015-06-21 MED ORDER — BENZONATATE 100 MG PO CAPS: 100.0000 mg | ORAL_CAPSULE | Freq: Two times a day (BID) | ORAL | Status: DC | PRN

## 2015-06-21 MED ORDER — ALBUTEROL SULFATE (2.5 MG/3ML) 0.083% IN NEBU: 2.5000 mg | INHALATION_SOLUTION | Freq: Once | RESPIRATORY_TRACT | Status: DC

## 2015-06-21 MED ORDER — FLUTICASONE-SALMETEROL 250-50 MCG/DOSE IN AEPB: 1.0000 | INHALATION_SPRAY | Freq: Two times a day (BID) | RESPIRATORY_TRACT | Status: DC

## 2015-06-21 MED ORDER — LEVOFLOXACIN 500 MG PO TABS: 500.0000 mg | ORAL_TABLET | Freq: Every day | ORAL | Status: DC

## 2015-06-21 NOTE — Progress Notes (Signed)
BP 145/73 mmHg  Pulse 103  Temp(Src) 97.5 F (36.4 C)  Wt 200 lb (90.719 kg)  SpO2 100%   Subjective:    Patient ID: Mia Padilla, female    DOB: 1934/08/07, 81 y.o.   MRN: [id number]  HPI: Mia Padilla is a 81 y.o. female  Chief Complaint  Patient presents with  . Pneumonia    1 week follow up   Here today for follow up on flu and pneumonia. Still has pneumonia on x-ray done yesterday. Feeling much better. Breathing better. Feeling more back to herself. No fevers, no chills. Otherwise feeling well. Does need a rx for her meclazine. No other concerns or complaints at this time.   Relevant past medical, surgical, family and social history reviewed and updated as indicated. Interim medical history since our last visit reviewed. Allergies and medications reviewed and updated.  Review of Systems  Constitutional: Negative.   HENT: Negative.   Respiratory: Negative.   Cardiovascular: Negative.   Psychiatric/Behavioral: Negative.     Per HPI unless specifically indicated above     Objective:    BP 145/73 mmHg  Pulse 103  Temp(Src) 97.5 F (36.4 C)  Wt 200 lb (90.719 kg)  SpO2 100%  Wt Readings from Last 3 Encounters:  06/21/15 200 lb (90.719 kg)  06/11/15 196 lb (88.905 kg)  06/09/15 193 lb (87.544 kg)    Physical Exam  Constitutional: She is oriented to person, place, and time. She appears well-developed and well-nourished. No distress.  HENT:  Head: Normocephalic and atraumatic.  Right Ear: Hearing normal.  Left Ear: Hearing normal.  Nose: Nose normal.  Eyes: Conjunctivae and lids are normal. Right eye exhibits no discharge. Left eye exhibits no discharge. No scleral icterus.  Cardiovascular: Normal rate, regular rhythm, normal heart sounds and intact distal pulses.  Exam reveals no gallop and no friction rub.   No murmur heard. Pulmonary/Chest: Effort normal. No respiratory distress. She has no decreased breath sounds. She has wheezes in the right upper field, the  right middle field, the right lower field, the left upper field, the left middle field and the left lower field. She has no rhonchi. She has no rales. She exhibits no tenderness.  Musculoskeletal: Normal range of motion.  Neurological: She is alert and oriented to person, place, and time.  Skin: Skin is intact. No rash noted.  Psychiatric: She has a normal mood and affect. Her speech is normal and behavior is normal. Judgment and thought content normal. Cognition and memory are normal.  Nursing note and vitals reviewed.   Results for orders placed or performed during the hospital encounter of 06/09/15  Rapid Influenza A&B Antigens [facility] only)  Result Value Ref Range   Influenza A [facility]) NOT DETECTED    Influenza B [facility]) DETECTED   Basic metabolic panel  Result Value Ref Range   Sodium 134 (L) 135 - 145 mmol/L   Potassium 3.7 3.5 - 5.1 mmol/L   Chloride 98 (L) 101 - 111 mmol/L   CO2 26 22 - 32 mmol/L   Glucose, Bld 174 (H) 65 - 99 mg/dL   BUN 15 6 - 20 mg/dL   Creatinine, Ser 0.68 0.44 - 1.00 mg/dL   Calcium 8.4 (L) 8.9 - 10.3 mg/dL   GFR calc non Af Amer >60 >60 mL/min   GFR calc Af Amer >60 >60 mL/min   Anion gap 10 5 - 15  CBC  Result Value Ref Range   WBC 4.6 3.6 -  11.0 K/uL   RBC 3.94 3.80 - 5.20 MIL/uL   Hemoglobin 11.8 (L) 12.0 - 16.0 g/dL   HCT 36.0 35.0 - 47.0 %   MCV 91.2 80.0 - 100.0 fL   MCH 30.0 26.0 - 34.0 pg   MCHC 32.9 32.0 - 36.0 g/dL   RDW 14.7 (H) 11.5 - 14.5 %   Platelets 139 (L) 150 - 440 K/uL  POCT rapid strep A [facility])  Result Value Ref Range   Streptococcus, Group A Screen (Direct) NEGATIVE NEGATIVE      Assessment & Plan:   Problem List Items Addressed This Visit    None    Visit Diagnoses    RLL pneumonia    -  Primary    Not resolved. 1 more week of levaquin. Recheck 2 weeks. Make sure to use albuterol and advair at home.     Relevant Medications    albuterol (PROVENTIL) (2.5 MG/3ML) 0.083% nebulizer solution 2.5 mg     Fluticasone-Salmeterol (ADVAIR) 250-50 MCG/DOSE AEPB    levofloxacin (LEVAQUIN) 500 MG tablet    benzonatate (TESSALON) 100 MG capsule        Follow up plan: Return in about 2 weeks (around 07/05/2015) for Lung check.

## 2015-06-24 DIAGNOSIS — I4891 Unspecified atrial fibrillation: Secondary | ICD-10-CM | POA: Diagnosis not present

## 2015-06-24 DIAGNOSIS — I951 Orthostatic hypotension: Secondary | ICD-10-CM | POA: Diagnosis not present

## 2015-06-24 DIAGNOSIS — D510 Vitamin B12 deficiency anemia due to intrinsic factor deficiency: Secondary | ICD-10-CM | POA: Diagnosis not present

## 2015-06-24 DIAGNOSIS — E119 Type 2 diabetes mellitus without complications: Secondary | ICD-10-CM | POA: Diagnosis not present

## 2015-06-26 DIAGNOSIS — E119 Type 2 diabetes mellitus without complications: Secondary | ICD-10-CM | POA: Diagnosis not present

## 2015-06-26 DIAGNOSIS — I951 Orthostatic hypotension: Secondary | ICD-10-CM | POA: Diagnosis not present

## 2015-06-26 DIAGNOSIS — D510 Vitamin B12 deficiency anemia due to intrinsic factor deficiency: Secondary | ICD-10-CM | POA: Diagnosis not present

## 2015-06-26 DIAGNOSIS — I4891 Unspecified atrial fibrillation: Secondary | ICD-10-CM | POA: Diagnosis not present

## 2015-06-28 DIAGNOSIS — I4891 Unspecified atrial fibrillation: Secondary | ICD-10-CM | POA: Diagnosis not present

## 2015-06-28 DIAGNOSIS — I951 Orthostatic hypotension: Secondary | ICD-10-CM | POA: Diagnosis not present

## 2015-06-28 DIAGNOSIS — E119 Type 2 diabetes mellitus without complications: Secondary | ICD-10-CM | POA: Diagnosis not present

## 2015-06-28 DIAGNOSIS — D510 Vitamin B12 deficiency anemia due to intrinsic factor deficiency: Secondary | ICD-10-CM | POA: Diagnosis not present

## 2015-07-01 DIAGNOSIS — E119 Type 2 diabetes mellitus without complications: Secondary | ICD-10-CM | POA: Diagnosis not present

## 2015-07-01 DIAGNOSIS — I4891 Unspecified atrial fibrillation: Secondary | ICD-10-CM | POA: Diagnosis not present

## 2015-07-01 DIAGNOSIS — D510 Vitamin B12 deficiency anemia due to intrinsic factor deficiency: Secondary | ICD-10-CM | POA: Diagnosis not present

## 2015-07-01 DIAGNOSIS — I951 Orthostatic hypotension: Secondary | ICD-10-CM | POA: Diagnosis not present

## 2015-07-03 DIAGNOSIS — E119 Type 2 diabetes mellitus without complications: Secondary | ICD-10-CM | POA: Diagnosis not present

## 2015-07-03 DIAGNOSIS — I951 Orthostatic hypotension: Secondary | ICD-10-CM | POA: Diagnosis not present

## 2015-07-03 DIAGNOSIS — D510 Vitamin B12 deficiency anemia due to intrinsic factor deficiency: Secondary | ICD-10-CM | POA: Diagnosis not present

## 2015-07-03 DIAGNOSIS — I4891 Unspecified atrial fibrillation: Secondary | ICD-10-CM | POA: Diagnosis not present

## 2015-07-05 ENCOUNTER — Ambulatory Visit (INDEPENDENT_AMBULATORY_CARE_PROVIDER_SITE_OTHER): Payer: Medicare Other | Admitting: Family Medicine

## 2015-07-05 ENCOUNTER — Encounter: Payer: Self-pay | Admitting: Family Medicine

## 2015-07-05 VITALS — BP 116/67 | HR 84 | Temp 98.2°F | Wt 193.0 lb

## 2015-07-05 DIAGNOSIS — I951 Orthostatic hypotension: Secondary | ICD-10-CM | POA: Diagnosis not present

## 2015-07-05 DIAGNOSIS — J181 Lobar pneumonia, unspecified organism: Principal | ICD-10-CM

## 2015-07-05 DIAGNOSIS — D510 Vitamin B12 deficiency anemia due to intrinsic factor deficiency: Secondary | ICD-10-CM | POA: Diagnosis not present

## 2015-07-05 DIAGNOSIS — J189 Pneumonia, unspecified organism: Secondary | ICD-10-CM

## 2015-07-05 DIAGNOSIS — E119 Type 2 diabetes mellitus without complications: Secondary | ICD-10-CM | POA: Diagnosis not present

## 2015-07-05 DIAGNOSIS — I4891 Unspecified atrial fibrillation: Secondary | ICD-10-CM | POA: Diagnosis not present

## 2015-07-05 MED ORDER — LEVEMIR FLEXTOUCH 100 UNIT/ML ~~LOC~~ SOPN: PEN_INJECTOR | SUBCUTANEOUS | Status: DC

## 2015-07-05 NOTE — Progress Notes (Signed)
BP 116/67 mmHg  Pulse 84  Temp(Src) 98.2 F (36.8 C)  Wt 193 lb (87.544 kg)  SpO2 100%   Subjective:    Patient ID: Mia Padilla, female    DOB: [birth year], 81 y.o.   MRN: [id number]  HPI: Mia Padilla is a 81 y.o. female  Chief Complaint  Patient presents with  . Lung Recheck    Patient states that she is doing much better   Doing much better! No fevers. No chills. Breathing better. Tolerating her medicine very well. Has been doing well. No concerns.    Relevant past medical, surgical, family and social history reviewed and updated as indicated. Interim medical history since our last visit reviewed. Allergies and medications reviewed and updated.  Review of Systems  Constitutional: Negative.   Respiratory: Negative.   Cardiovascular: Negative.   Psychiatric/Behavioral: Negative.     Per HPI unless specifically indicated above     Objective:    BP 116/67 mmHg  Pulse 84  Temp(Src) 98.2 F (36.8 C)  Wt 193 lb (87.544 kg)  SpO2 100%  Wt Readings from Last 3 Encounters:  07/05/15 193 lb (87.544 kg)  06/21/15 200 lb (90.719 kg)  06/11/15 196 lb (88.905 kg)    Physical Exam  Constitutional: She is oriented to person, place, and time. She appears well-developed and well-nourished. No distress.  HENT:  Head: Normocephalic and atraumatic.  Right Ear: Hearing normal.  Left Ear: Hearing normal.  Nose: Nose normal.  Eyes: Conjunctivae and lids are normal. Right eye exhibits no discharge. Left eye exhibits no discharge. No scleral icterus.  Cardiovascular: Normal rate, regular rhythm, normal heart sounds and intact distal pulses.  Exam reveals no gallop and no friction rub.   No murmur heard. Pulmonary/Chest: Effort normal and breath sounds normal. No respiratory distress. She has no wheezes. She has no rales. She exhibits no tenderness.  Musculoskeletal: Normal range of motion.  Neurological: She is alert and oriented to person, place, and time.  Skin: Skin is warm, dry  and intact. No rash noted. She is not diaphoretic. No erythema. No pallor.  Psychiatric: She has a normal mood and affect. Her speech is normal and behavior is normal. Judgment and thought content normal. Cognition and memory are normal.  Nursing note and vitals reviewed.   Results for orders placed or performed during the hospital encounter of 06/09/15  Rapid Influenza A&B Antigens [facility] only)  Result Value Ref Range   Influenza A [facility]) NOT DETECTED    Influenza B [facility]) DETECTED   Basic metabolic panel  Result Value Ref Range   Sodium 134 (L) 135 - 145 mmol/L   Potassium 3.7 3.5 - 5.1 mmol/L   Chloride 98 (L) 101 - 111 mmol/L   CO2 26 22 - 32 mmol/L   Glucose, Bld 174 (H) 65 - 99 mg/dL   BUN 15 6 - 20 mg/dL   Creatinine, Ser 0.68 0.44 - 1.00 mg/dL   Calcium 8.4 (L) 8.9 - 10.3 mg/dL   GFR calc non Af Amer >60 >60 mL/min   GFR calc Af Amer >60 >60 mL/min   Anion gap 10 5 - 15  CBC  Result Value Ref Range   WBC 4.6 3.6 - 11.0 K/uL   RBC 3.94 3.80 - 5.20 MIL/uL   Hemoglobin 11.8 (L) 12.0 - 16.0 g/dL   HCT 36.0 35.0 - 47.0 %   MCV 91.2 80.0 - 100.0 fL   MCH 30.0 26.0 - 34.0 pg   MCHC  32.9 32.0 - 36.0 g/dL   RDW 14.7 (H) 11.5 - 14.5 %   Platelets 139 (L) 150 - 440 K/uL  POCT rapid strep A [facility])  Result Value Ref Range   Streptococcus, Group A Screen (Direct) NEGATIVE NEGATIVE      Assessment & Plan:   Problem List Items Addressed This Visit    None    Visit Diagnoses    RLL pneumonia    -  Primary    Resolved. Call with any problems. Follow up with PCP as scheduled.         Follow up plan: Return As scheduled.

## 2015-07-08 DIAGNOSIS — D510 Vitamin B12 deficiency anemia due to intrinsic factor deficiency: Secondary | ICD-10-CM | POA: Diagnosis not present

## 2015-07-08 DIAGNOSIS — I951 Orthostatic hypotension: Secondary | ICD-10-CM | POA: Diagnosis not present

## 2015-07-08 DIAGNOSIS — I4891 Unspecified atrial fibrillation: Secondary | ICD-10-CM | POA: Diagnosis not present

## 2015-07-08 DIAGNOSIS — E119 Type 2 diabetes mellitus without complications: Secondary | ICD-10-CM | POA: Diagnosis not present

## 2015-07-09 DIAGNOSIS — I951 Orthostatic hypotension: Secondary | ICD-10-CM | POA: Diagnosis not present

## 2015-07-09 DIAGNOSIS — E119 Type 2 diabetes mellitus without complications: Secondary | ICD-10-CM | POA: Diagnosis not present

## 2015-07-09 DIAGNOSIS — D510 Vitamin B12 deficiency anemia due to intrinsic factor deficiency: Secondary | ICD-10-CM | POA: Diagnosis not present

## 2015-07-09 DIAGNOSIS — I4891 Unspecified atrial fibrillation: Secondary | ICD-10-CM | POA: Diagnosis not present

## 2015-07-10 ENCOUNTER — Telehealth: Payer: Self-pay | Admitting: Family Medicine

## 2015-07-10 NOTE — Telephone Encounter (Signed)
Lisa from [place] [facility] would like to recertify the pt for the following:  [place] (personal care, bathing, 3 times per week) B12 Diabetic management General assessment   Please call Lattie Haw to give a verbal order. Thanks.

## 2015-07-10 NOTE — Telephone Encounter (Signed)
Verbal called in

## 2015-07-10 NOTE — Telephone Encounter (Signed)
That is perfectly fine. OK to call in verbal for all of that

## 2015-07-12 DIAGNOSIS — E119 Type 2 diabetes mellitus without complications: Secondary | ICD-10-CM | POA: Diagnosis not present

## 2015-07-12 DIAGNOSIS — I4891 Unspecified atrial fibrillation: Secondary | ICD-10-CM | POA: Diagnosis not present

## 2015-07-12 DIAGNOSIS — I951 Orthostatic hypotension: Secondary | ICD-10-CM | POA: Diagnosis not present

## 2015-07-12 DIAGNOSIS — D510 Vitamin B12 deficiency anemia due to intrinsic factor deficiency: Secondary | ICD-10-CM | POA: Diagnosis not present

## 2015-07-15 DIAGNOSIS — I4891 Unspecified atrial fibrillation: Secondary | ICD-10-CM | POA: Diagnosis not present

## 2015-07-15 DIAGNOSIS — D510 Vitamin B12 deficiency anemia due to intrinsic factor deficiency: Secondary | ICD-10-CM | POA: Diagnosis not present

## 2015-07-15 DIAGNOSIS — E119 Type 2 diabetes mellitus without complications: Secondary | ICD-10-CM | POA: Diagnosis not present

## 2015-07-15 DIAGNOSIS — I951 Orthostatic hypotension: Secondary | ICD-10-CM | POA: Diagnosis not present

## 2015-07-17 DIAGNOSIS — I951 Orthostatic hypotension: Secondary | ICD-10-CM | POA: Diagnosis not present

## 2015-07-17 DIAGNOSIS — E119 Type 2 diabetes mellitus without complications: Secondary | ICD-10-CM | POA: Diagnosis not present

## 2015-07-17 DIAGNOSIS — D510 Vitamin B12 deficiency anemia due to intrinsic factor deficiency: Secondary | ICD-10-CM | POA: Diagnosis not present

## 2015-07-17 DIAGNOSIS — I4891 Unspecified atrial fibrillation: Secondary | ICD-10-CM | POA: Diagnosis not present

## 2015-07-19 DIAGNOSIS — I4891 Unspecified atrial fibrillation: Secondary | ICD-10-CM | POA: Diagnosis not present

## 2015-07-19 DIAGNOSIS — I951 Orthostatic hypotension: Secondary | ICD-10-CM | POA: Diagnosis not present

## 2015-07-19 DIAGNOSIS — E119 Type 2 diabetes mellitus without complications: Secondary | ICD-10-CM | POA: Diagnosis not present

## 2015-07-19 DIAGNOSIS — D510 Vitamin B12 deficiency anemia due to intrinsic factor deficiency: Secondary | ICD-10-CM | POA: Diagnosis not present

## 2015-07-22 DIAGNOSIS — D510 Vitamin B12 deficiency anemia due to intrinsic factor deficiency: Secondary | ICD-10-CM | POA: Diagnosis not present

## 2015-07-22 DIAGNOSIS — I4891 Unspecified atrial fibrillation: Secondary | ICD-10-CM | POA: Diagnosis not present

## 2015-07-22 DIAGNOSIS — I951 Orthostatic hypotension: Secondary | ICD-10-CM | POA: Diagnosis not present

## 2015-07-22 DIAGNOSIS — E119 Type 2 diabetes mellitus without complications: Secondary | ICD-10-CM | POA: Diagnosis not present

## 2015-07-24 ENCOUNTER — Ambulatory Visit: Payer: Medicare Other | Admitting: Family Medicine

## 2015-07-24 DIAGNOSIS — E119 Type 2 diabetes mellitus without complications: Secondary | ICD-10-CM | POA: Diagnosis not present

## 2015-07-24 DIAGNOSIS — D510 Vitamin B12 deficiency anemia due to intrinsic factor deficiency: Secondary | ICD-10-CM | POA: Diagnosis not present

## 2015-07-24 DIAGNOSIS — I4891 Unspecified atrial fibrillation: Secondary | ICD-10-CM | POA: Diagnosis not present

## 2015-07-24 DIAGNOSIS — I951 Orthostatic hypotension: Secondary | ICD-10-CM | POA: Diagnosis not present

## 2015-07-26 DIAGNOSIS — I951 Orthostatic hypotension: Secondary | ICD-10-CM | POA: Diagnosis not present

## 2015-07-26 DIAGNOSIS — E119 Type 2 diabetes mellitus without complications: Secondary | ICD-10-CM | POA: Diagnosis not present

## 2015-07-26 DIAGNOSIS — D510 Vitamin B12 deficiency anemia due to intrinsic factor deficiency: Secondary | ICD-10-CM | POA: Diagnosis not present

## 2015-07-26 DIAGNOSIS — I4891 Unspecified atrial fibrillation: Secondary | ICD-10-CM | POA: Diagnosis not present

## 2015-07-29 DIAGNOSIS — H2513 Age-related nuclear cataract, bilateral: Secondary | ICD-10-CM | POA: Diagnosis not present

## 2015-07-29 DIAGNOSIS — H401133 Primary open-angle glaucoma, bilateral, severe stage: Secondary | ICD-10-CM | POA: Diagnosis not present

## 2015-07-30 DIAGNOSIS — I4891 Unspecified atrial fibrillation: Secondary | ICD-10-CM | POA: Diagnosis not present

## 2015-07-30 DIAGNOSIS — E119 Type 2 diabetes mellitus without complications: Secondary | ICD-10-CM | POA: Diagnosis not present

## 2015-07-30 DIAGNOSIS — I951 Orthostatic hypotension: Secondary | ICD-10-CM | POA: Diagnosis not present

## 2015-07-30 DIAGNOSIS — D510 Vitamin B12 deficiency anemia due to intrinsic factor deficiency: Secondary | ICD-10-CM | POA: Diagnosis not present

## 2015-07-31 DIAGNOSIS — I4891 Unspecified atrial fibrillation: Secondary | ICD-10-CM | POA: Diagnosis not present

## 2015-07-31 DIAGNOSIS — D510 Vitamin B12 deficiency anemia due to intrinsic factor deficiency: Secondary | ICD-10-CM | POA: Diagnosis not present

## 2015-07-31 DIAGNOSIS — I951 Orthostatic hypotension: Secondary | ICD-10-CM | POA: Diagnosis not present

## 2015-07-31 DIAGNOSIS — E119 Type 2 diabetes mellitus without complications: Secondary | ICD-10-CM | POA: Diagnosis not present

## 2015-08-02 DIAGNOSIS — E119 Type 2 diabetes mellitus without complications: Secondary | ICD-10-CM | POA: Diagnosis not present

## 2015-08-02 DIAGNOSIS — I951 Orthostatic hypotension: Secondary | ICD-10-CM | POA: Diagnosis not present

## 2015-08-02 DIAGNOSIS — I4891 Unspecified atrial fibrillation: Secondary | ICD-10-CM | POA: Diagnosis not present

## 2015-08-02 DIAGNOSIS — D510 Vitamin B12 deficiency anemia due to intrinsic factor deficiency: Secondary | ICD-10-CM | POA: Diagnosis not present

## 2015-08-05 ENCOUNTER — Encounter: Payer: Self-pay | Admitting: Family Medicine

## 2015-08-05 ENCOUNTER — Ambulatory Visit (INDEPENDENT_AMBULATORY_CARE_PROVIDER_SITE_OTHER): Payer: Medicare Other | Admitting: Family Medicine

## 2015-08-05 VITALS — BP 152/73 | HR 71 | Temp 97.5°F | Ht 62.2 in | Wt 197.0 lb

## 2015-08-05 DIAGNOSIS — D510 Vitamin B12 deficiency anemia due to intrinsic factor deficiency: Secondary | ICD-10-CM | POA: Diagnosis not present

## 2015-08-05 DIAGNOSIS — I951 Orthostatic hypotension: Secondary | ICD-10-CM | POA: Diagnosis not present

## 2015-08-05 DIAGNOSIS — E1151 Type 2 diabetes mellitus with diabetic peripheral angiopathy without gangrene: Secondary | ICD-10-CM

## 2015-08-05 DIAGNOSIS — E119 Type 2 diabetes mellitus without complications: Secondary | ICD-10-CM | POA: Diagnosis not present

## 2015-08-05 DIAGNOSIS — I4891 Unspecified atrial fibrillation: Secondary | ICD-10-CM | POA: Diagnosis not present

## 2015-08-05 LAB — BAYER DCA HB A1C WAIVED: HB A1C: 6.6 % (ref <7.0)

## 2015-08-05 MED ORDER — METOPROLOL SUCCINATE ER 100 MG PO TB24: 100.0000 mg | ORAL_TABLET | Freq: Every day | ORAL | Status: DC

## 2015-08-05 MED ORDER — LISINOPRIL 10 MG PO TABS: 10.0000 mg | ORAL_TABLET | Freq: Every day | ORAL | Status: DC

## 2015-08-05 MED ORDER — DIGOXIN 125 MCG PO TABS
125.0000 ug | ORAL_TABLET | Freq: Every day | ORAL | Status: DC
Start: 2015-08-05 — End: 2015-08-26

## 2015-08-05 MED ORDER — METFORMIN HCL 500 MG PO TABS: 500.0000 mg | ORAL_TABLET | Freq: Two times a day (BID) | ORAL | Status: DC

## 2015-08-05 NOTE — Progress Notes (Signed)
BP 152/73 mmHg  Pulse 71  Temp(Src) 97.5 F (36.4 C)  Ht 5' 2.2" (1.58 m)  Wt 197 lb (89.359 kg)  BMI 35.80 kg/m2  SpO2 99%   Subjective:    Patient ID: Mia Padilla, female    DOB: 15-May-1934, 81 y.o.   MRN: [id number]  HPI: [name] is a 81 y.o. female  Chief Complaint  Patient presents with  . Diabetes  Patient here with her daughter Patient follow-up no low blood sugar spells no issues with diabetes medications actually doing very well waits essentially stable some some up and down. Not having any further issues with edema has not taken Lasix anymore. Blood pressure doing well no complaints No chest pain shortness breath or other cardiac-type symptoms  Relevant past medical, surgical, family and social history reviewed and updated as indicated. Interim medical history since our last visit reviewed. Allergies and medications reviewed and updated.  Review of Systems  Constitutional: Negative.   Respiratory: Negative.   Cardiovascular: Negative.     Per HPI unless specifically indicated above     Objective:    BP 152/73 mmHg  Pulse 71  Temp(Src) 97.5 F (36.4 C)  Ht 5' 2.2" (1.58 m)  Wt 197 lb (89.359 kg)  BMI 35.80 kg/m2  SpO2 99%  Wt Readings from Last 3 Encounters:  08/05/15 197 lb (89.359 kg)  07/05/15 193 lb (87.544 kg)  06/21/15 200 lb (90.719 kg)    Physical Exam  Constitutional: She is oriented to person, place, and time. She appears well-developed and well-nourished. No distress.  HENT:  Head: Normocephalic and atraumatic.  Right Ear: Hearing normal.  Left Ear: Hearing normal.  Nose: Nose normal.  Eyes: Conjunctivae and lids are normal. Right eye exhibits no discharge. Left eye exhibits no discharge. No scleral icterus.  Pulmonary/Chest: Effort normal. No respiratory distress.  Musculoskeletal: Normal range of motion.  Neurological: She is alert and oriented to person, place, and time.  Skin: Skin is intact. No rash noted.  Psychiatric:  She has a normal mood and affect. Her speech is normal and behavior is normal. Judgment and thought content normal. Cognition and memory are normal.    Results for orders placed or performed during the hospital encounter of 06/09/15  Rapid Influenza A&B Antigens ([facility] only)  Result Value Ref Range   Influenza A [facility]) NOT DETECTED    Influenza B [facility]) DETECTED   Basic metabolic panel  Result Value Ref Range   Sodium 134 (L) 135 - 145 mmol/L   Potassium 3.7 3.5 - 5.1 mmol/L   Chloride 98 (L) 101 - 111 mmol/L   CO2 26 22 - 32 mmol/L   Glucose, Bld 174 (H) 65 - 99 mg/dL   BUN 15 6 - 20 mg/dL   Creatinine, Ser 0.68 0.44 - 1.00 mg/dL   Calcium 8.4 (L) 8.9 - 10.3 mg/dL   GFR calc non Af Amer >60 >60 mL/min   GFR calc Af Amer >60 >60 mL/min   Anion gap 10 5 - 15  CBC  Result Value Ref Range   WBC 4.6 3.6 - 11.0 K/uL   RBC 3.94 3.80 - 5.20 MIL/uL   Hemoglobin 11.8 (L) 12.0 - 16.0 g/dL   HCT 36.0 35.0 - 47.0 %   MCV 91.2 80.0 - 100.0 fL   MCH 30.0 26.0 - 34.0 pg   MCHC 32.9 32.0 - 36.0 g/dL   RDW 14.7 (H) 11.5 - 14.5 %   Platelets 139 (L) 150 -  440 K/uL  POCT rapid strep A [facility])  Result Value Ref Range   Streptococcus, Group A Screen (Direct) NEGATIVE NEGATIVE      Assessment & Plan:   Problem List Items Addressed This Visit      Endocrine   Diabetes mellitus ([place]) - Primary   Relevant Medications   lisinopril (PRINIVIL,ZESTRIL) 10 MG tablet   metFORMIN (GLUCOPHAGE) 500 MG tablet   Other Relevant Orders   Bayer DCA Hb A1c Waived       Follow up plan: Return in about 3 months (around 11/04/2015), or if symptoms worsen or fail to improve, for This summer for physical and hemoglobin A1c, Physical Exam.

## 2015-08-07 DIAGNOSIS — I4891 Unspecified atrial fibrillation: Secondary | ICD-10-CM | POA: Diagnosis not present

## 2015-08-07 DIAGNOSIS — I951 Orthostatic hypotension: Secondary | ICD-10-CM | POA: Diagnosis not present

## 2015-08-07 DIAGNOSIS — E119 Type 2 diabetes mellitus without complications: Secondary | ICD-10-CM | POA: Diagnosis not present

## 2015-08-07 DIAGNOSIS — D510 Vitamin B12 deficiency anemia due to intrinsic factor deficiency: Secondary | ICD-10-CM | POA: Diagnosis not present

## 2015-08-08 ENCOUNTER — Telehealth: Payer: Self-pay | Admitting: Family Medicine

## 2015-08-08 MED ORDER — SOLIFENACIN SUCCINATE 5 MG PO TABS: 10.0000 mg | ORAL_TABLET | Freq: Every day | ORAL | Status: DC

## 2015-08-08 MED ORDER — CLOPIDOGREL BISULFATE 75 MG PO TABS: 75.0000 mg | ORAL_TABLET | Freq: Every day | ORAL | Status: DC

## 2015-08-08 NOTE — Telephone Encounter (Signed)
Prescriptions sent in

## 2015-08-08 NOTE — Telephone Encounter (Signed)
Pt called stated she would like a call back from [place], stated she forgot to ask Dr. Jeananne Rama to refill her Plavix on Monday when she was here. Pharm is Paediatric nurse on [address] in [place]. Thanks.

## 2015-08-08 NOTE — Telephone Encounter (Signed)
Pt also needs refills on [facility]. Pharm is Paediatric nurse on [address]. Thanks.

## 2015-08-09 DIAGNOSIS — E119 Type 2 diabetes mellitus without complications: Secondary | ICD-10-CM | POA: Diagnosis not present

## 2015-08-09 DIAGNOSIS — I4891 Unspecified atrial fibrillation: Secondary | ICD-10-CM | POA: Diagnosis not present

## 2015-08-09 DIAGNOSIS — I951 Orthostatic hypotension: Secondary | ICD-10-CM | POA: Diagnosis not present

## 2015-08-09 DIAGNOSIS — D510 Vitamin B12 deficiency anemia due to intrinsic factor deficiency: Secondary | ICD-10-CM | POA: Diagnosis not present

## 2015-08-12 DIAGNOSIS — I951 Orthostatic hypotension: Secondary | ICD-10-CM | POA: Diagnosis not present

## 2015-08-12 DIAGNOSIS — E119 Type 2 diabetes mellitus without complications: Secondary | ICD-10-CM | POA: Diagnosis not present

## 2015-08-12 DIAGNOSIS — I4891 Unspecified atrial fibrillation: Secondary | ICD-10-CM | POA: Diagnosis not present

## 2015-08-12 DIAGNOSIS — D510 Vitamin B12 deficiency anemia due to intrinsic factor deficiency: Secondary | ICD-10-CM | POA: Diagnosis not present

## 2015-08-12 DIAGNOSIS — B351 Tinea unguium: Secondary | ICD-10-CM | POA: Diagnosis not present

## 2015-08-14 DIAGNOSIS — I951 Orthostatic hypotension: Secondary | ICD-10-CM | POA: Diagnosis not present

## 2015-08-14 DIAGNOSIS — D510 Vitamin B12 deficiency anemia due to intrinsic factor deficiency: Secondary | ICD-10-CM | POA: Diagnosis not present

## 2015-08-14 DIAGNOSIS — E119 Type 2 diabetes mellitus without complications: Secondary | ICD-10-CM | POA: Diagnosis not present

## 2015-08-14 DIAGNOSIS — I4891 Unspecified atrial fibrillation: Secondary | ICD-10-CM | POA: Diagnosis not present

## 2015-08-16 DIAGNOSIS — I951 Orthostatic hypotension: Secondary | ICD-10-CM | POA: Diagnosis not present

## 2015-08-16 DIAGNOSIS — D510 Vitamin B12 deficiency anemia due to intrinsic factor deficiency: Secondary | ICD-10-CM | POA: Diagnosis not present

## 2015-08-16 DIAGNOSIS — E119 Type 2 diabetes mellitus without complications: Secondary | ICD-10-CM | POA: Diagnosis not present

## 2015-08-16 DIAGNOSIS — I4891 Unspecified atrial fibrillation: Secondary | ICD-10-CM | POA: Diagnosis not present

## 2015-08-19 DIAGNOSIS — I4891 Unspecified atrial fibrillation: Secondary | ICD-10-CM | POA: Diagnosis not present

## 2015-08-19 DIAGNOSIS — D510 Vitamin B12 deficiency anemia due to intrinsic factor deficiency: Secondary | ICD-10-CM | POA: Diagnosis not present

## 2015-08-19 DIAGNOSIS — E119 Type 2 diabetes mellitus without complications: Secondary | ICD-10-CM | POA: Diagnosis not present

## 2015-08-19 DIAGNOSIS — I951 Orthostatic hypotension: Secondary | ICD-10-CM | POA: Diagnosis not present

## 2015-08-21 ENCOUNTER — Encounter: Payer: Self-pay | Admitting: General Surgery

## 2015-08-21 ENCOUNTER — Ambulatory Visit (INDEPENDENT_AMBULATORY_CARE_PROVIDER_SITE_OTHER): Payer: Medicare Other | Admitting: General Surgery

## 2015-08-21 VITALS — BP 158/88 | HR 78 | Resp 14 | Ht 62.0 in | Wt 196.0 lb

## 2015-08-21 DIAGNOSIS — E119 Type 2 diabetes mellitus without complications: Secondary | ICD-10-CM | POA: Diagnosis not present

## 2015-08-21 DIAGNOSIS — D510 Vitamin B12 deficiency anemia due to intrinsic factor deficiency: Secondary | ICD-10-CM | POA: Diagnosis not present

## 2015-08-21 DIAGNOSIS — C50412 Malignant neoplasm of upper-outer quadrant of left female breast: Secondary | ICD-10-CM | POA: Diagnosis not present

## 2015-08-21 DIAGNOSIS — I951 Orthostatic hypotension: Secondary | ICD-10-CM | POA: Diagnosis not present

## 2015-08-21 DIAGNOSIS — I4891 Unspecified atrial fibrillation: Secondary | ICD-10-CM | POA: Diagnosis not present

## 2015-08-21 MED ORDER — ANASTROZOLE 1 MG PO TABS: 1.0000 mg | ORAL_TABLET | Freq: Every day | ORAL | Status: DC

## 2015-08-21 NOTE — Progress Notes (Signed)
Patient ID: Mia Padilla, female   DOB: 1934-12-09, 81 y.o.   MRN: [id number]  Chief Complaint  Patient presents with  . Follow-up    mammogram    HPI Mia Padilla is a 81 y.o. female with a history of left breast cancer and left mastectomy here for a follow up. Her last mammogram was 05/28/15. She does self breast checks and gets regular mammograms done. She reports no new breast problems.    I have reviewed the history of present illness with the patient.       HPI  Past Medical History  Diagnosis Date  . Hypotension   . History of seizures   . Obesity   . Debilitated   . Hypertension   . Hyperlipidemia   . Bowel incontinence   . Urinary incontinence   . Glaucoma   . Black head   . Arthritis     hands  . Carpal tunnel syndrome   . Breast cancer of upper-outer quadrant of left female breast ([place]) 2015    Past Surgical History  Procedure Laterality Date  . Partial hysterectomy    . Tonsillectomy    . Umbilical hernia repair    . Carpal tunnel release Right 30 years   . Bowel resection  2005  . Breast surgery  08/03/13    left mastectomy  . Mastectomy      left     Family History  Problem Relation Age of Onset  . Cervical cancer Mother   . Diabetes Mother   . Glaucoma Mother   . Cervical cancer Daughter   . Hypertension Daughter   . Hypertension Father   . Hypertension Sister   . Hypertension Brother   . Hypertension Son   . Diabetes Brother   . HIV Brother     Social History Social History  Substance Use Topics  . Smoking status: Former Research scientist (life sciences)  . Smokeless tobacco: Never Used  . Alcohol Use: No    Allergies  Allergen Reactions  . Darvocet [Propoxyphene N-Acetaminophen]   . Percocet [Oxycodone-Acetaminophen]     hallucination    Current Outpatient Prescriptions  Medication Sig Dispense Refill  . acetaminophen (TYLENOL) 500 MG tablet Take 500 mg by mouth every 6 (six) hours as needed.    Marland Kitchen albuterol (PROVENTIL HFA;VENTOLIN HFA) 108 (90 Base)  MCG/ACT inhaler Inhale 2 puffs into the lungs every 6 (six) hours as needed. 18 g 5  . alendronate (FOSAMAX) 70 MG tablet Take 70 mg by mouth once a week. Take with a full glass of water on an empty stomach.    Marland Kitchen anastrozole (ARIMIDEX) 1 MG tablet Take 1 tablet (1 mg total) by mouth daily. 30 tablet 12  . aspirin EC 81 MG tablet Take 81 mg by mouth daily.    . blood glucose meter kit and supplies Diagnosis:E11.9 Test glucose twice daily 1 each 12  . brimonidine (ALPHAGAN P) 0.1 % SOLN 2 (two) times daily.     . Canagliflozin (INVOKANA) 100 MG TABS Take 100 mg by mouth daily.    . clopidogrel (PLAVIX) 75 MG tablet Take 1 tablet (75 mg total) by mouth daily. 30 tablet 6  . cyanocobalamin (,VITAMIN B-12,) 1000 MCG/ML injection Inject 1 mL (1,000 mcg total) into the muscle every 30 (thirty) days. 1 mL 12  . digoxin (LANOXIN) 0.125 MG tablet Take 1 tablet (125 mcg total) by mouth daily. 30 tablet 6  . dorzolamide (TRUSOPT) 2 % ophthalmic solution 1 drop 2 (two)  times daily.     . fluticasone (FLONASE) 50 MCG/ACT nasal spray Place 2 sprays into the nose daily.    . Fluticasone-Salmeterol (ADVAIR) 250-50 MCG/DOSE AEPB Inhale 1 puff into the lungs every 12 (twelve) hours. 60 each 12  . glucose blood test strip Use as instructed 100 each 12  . LEVEMIR FLEXTOUCH 100 UNIT/ML Pen INJECT 23 TO 30 UNITS PER DAY 15 mL 11  . levothyroxine (SYNTHROID, LEVOTHROID) 75 MCG tablet Take 1 tablet (75 mcg total) by mouth daily. 30 tablet 12  . lisinopril (PRINIVIL,ZESTRIL) 10 MG tablet Take 1 tablet (10 mg total) by mouth daily. 30 tablet 6  . meclizine (ANTIVERT) 25 MG tablet Take 1 tablet (25 mg total) by mouth 3 (three) times daily as needed. 30 tablet 3  . metFORMIN (GLUCOPHAGE) 500 MG tablet Take 1 tablet (500 mg total) by mouth 2 (two) times daily with a meal. 60 tablet 6  . metoprolol succinate (TOPROL-XL) 100 MG 24 hr tablet Take 1 tablet (100 mg total) by mouth daily. 30 tablet 6  . mometasone (ELOCON) 0.1 %  cream Apply 1 application topically daily. 45 g 1  . mupirocin ointment (BACTROBAN) 2 % Place 1 application into the nose 2 (two) times daily. 22 g 2  . Olopatadine HCl (PATADAY) 0.2 % SOLN Apply to eye daily.    . potassium chloride (K-DUR) 10 MEQ tablet Take 1 tablet (10 mEq total) by mouth daily as needed. 30 tablet 6  . solifenacin (VESICARE) 5 MG tablet Take 2 tablets (10 mg total) by mouth daily. 60 tablet 12  . travoprost, benzalkonium, (TRAVATAN) 0.004 % ophthalmic solution Apply 1 drop to eye at bedtime.     Current Facility-Administered Medications  Medication Dose Route Frequency Provider Last Rate Last Dose  . albuterol (PROVENTIL) (2.5 MG/3ML) 0.083% nebulizer solution 2.5 mg  2.5 mg Nebulization Once [employer], DO      . albuterol (PROVENTIL) (2.5 MG/3ML) 0.083% nebulizer solution 2.5 mg  2.5 mg Nebulization Once [employer], DO        Review of Systems Review of Systems  Constitutional: Negative.   Respiratory: Negative.   Cardiovascular: Negative.     Blood pressure 158/88, pulse 78, resp. rate 14, height '5\' 2"'$  (1.575 m), weight 196 lb (88.905 kg).  Physical Exam Physical Exam  Constitutional: She is oriented to person, place, and time. She appears well-developed and well-nourished.  Eyes: Conjunctivae are normal. No scleral icterus.  Neck: Neck supple.  Cardiovascular: Normal rate, regular rhythm and normal heart sounds.   Pulmonary/Chest: Effort normal and breath sounds normal. Right breast exhibits no inverted nipple, no mass, no nipple discharge, no skin change and no tenderness.  Left mastectomy sight well healed. No sign of local recurrence.   Lymphadenopathy:    She has no cervical adenopathy.    She has no axillary adenopathy.  Neurological: She is alert and oriented to person, place, and time.  Skin: Skin is warm and dry.  Psychiatric: She has a normal mood and affect.    Data Reviewed Mammogram BIRADS 2  Assessment    Post left mastectomy  for CA in 2015. Currently on Arimidex and doing well.    Plan    Follow up in one year with Unilateral right diagnostic mammogram and office visit.     PCP: Golden Pop This has been scribed by Lesly Rubenstein LPN    [facility] G 08/21/2015, 9:33 AM

## 2015-08-21 NOTE — Patient Instructions (Signed)
Follow-up in one year.

## 2015-08-23 DIAGNOSIS — D510 Vitamin B12 deficiency anemia due to intrinsic factor deficiency: Secondary | ICD-10-CM | POA: Diagnosis not present

## 2015-08-23 DIAGNOSIS — I4891 Unspecified atrial fibrillation: Secondary | ICD-10-CM | POA: Diagnosis not present

## 2015-08-23 DIAGNOSIS — I951 Orthostatic hypotension: Secondary | ICD-10-CM | POA: Diagnosis not present

## 2015-08-23 DIAGNOSIS — E119 Type 2 diabetes mellitus without complications: Secondary | ICD-10-CM | POA: Diagnosis not present

## 2015-08-26 ENCOUNTER — Other Ambulatory Visit: Payer: Self-pay | Admitting: Family Medicine

## 2015-08-26 DIAGNOSIS — E119 Type 2 diabetes mellitus without complications: Secondary | ICD-10-CM | POA: Diagnosis not present

## 2015-08-26 DIAGNOSIS — D510 Vitamin B12 deficiency anemia due to intrinsic factor deficiency: Secondary | ICD-10-CM | POA: Diagnosis not present

## 2015-08-26 DIAGNOSIS — I951 Orthostatic hypotension: Secondary | ICD-10-CM | POA: Diagnosis not present

## 2015-08-26 DIAGNOSIS — I4891 Unspecified atrial fibrillation: Secondary | ICD-10-CM | POA: Diagnosis not present

## 2015-08-26 MED ORDER — DIGOXIN 125 MCG PO TABS: 125.0000 ug | ORAL_TABLET | Freq: Every day | ORAL | Status: DC

## 2015-08-26 MED ORDER — CLOPIDOGREL BISULFATE 75 MG PO TABS: 75.0000 mg | ORAL_TABLET | Freq: Every day | ORAL | Status: DC

## 2015-08-26 MED ORDER — LISINOPRIL 10 MG PO TABS: 10.0000 mg | ORAL_TABLET | Freq: Every day | ORAL | Status: DC

## 2015-08-26 MED ORDER — METOPROLOL SUCCINATE ER 100 MG PO TB24: 100.0000 mg | ORAL_TABLET | Freq: Every day | ORAL | Status: DC

## 2015-08-26 MED ORDER — CANAGLIFLOZIN 100 MG PO TABS: 100.0000 mg | ORAL_TABLET | Freq: Every day | ORAL | Status: DC

## 2015-08-26 MED ORDER — ALENDRONATE SODIUM 70 MG PO TABS: 70.0000 mg | ORAL_TABLET | ORAL | Status: DC

## 2015-08-26 MED ORDER — SOLIFENACIN SUCCINATE 5 MG PO TABS: 10.0000 mg | ORAL_TABLET | Freq: Every day | ORAL | Status: DC

## 2015-08-26 MED ORDER — METFORMIN HCL 500 MG PO TABS: 500.0000 mg | ORAL_TABLET | Freq: Two times a day (BID) | ORAL | Status: DC

## 2015-08-26 MED ORDER — MECLIZINE HCL 25 MG PO TABS: 25.0000 mg | ORAL_TABLET | Freq: Three times a day (TID) | ORAL | Status: DC | PRN

## 2015-08-26 MED ORDER — FLUTICASONE-SALMETEROL 250-50 MCG/DOSE IN AEPB: 1.0000 | INHALATION_SPRAY | Freq: Two times a day (BID) | RESPIRATORY_TRACT | Status: DC

## 2015-08-26 MED ORDER — LEVOTHYROXINE SODIUM 75 MCG PO TABS: 75.0000 ug | ORAL_TABLET | Freq: Every day | ORAL | Status: DC

## 2015-08-26 MED ORDER — LEVEMIR FLEXTOUCH 100 UNIT/ML ~~LOC~~ SOPN: PEN_INJECTOR | SUBCUTANEOUS | Status: DC

## 2015-08-27 ENCOUNTER — Telehealth: Payer: Self-pay

## 2015-08-27 MED ORDER — DIGOXIN 125 MCG PO TABS: 125.0000 ug | ORAL_TABLET | Freq: Every day | ORAL | Status: DC

## 2015-08-27 MED ORDER — MUPIROCIN 2 % EX OINT: 1.0000 "application " | TOPICAL_OINTMENT | Freq: Two times a day (BID) | CUTANEOUS | Status: DC

## 2015-08-27 MED ORDER — MOMETASONE FUROATE 0.1 % EX CREA: 1.0000 "application " | TOPICAL_CREAM | Freq: Every day | CUTANEOUS | Status: DC

## 2015-08-27 NOTE — Telephone Encounter (Signed)
[facility] requesting   Mupirocin Oin Mometasone Cream Digoxin

## 2015-08-28 DIAGNOSIS — E119 Type 2 diabetes mellitus without complications: Secondary | ICD-10-CM | POA: Diagnosis not present

## 2015-08-28 DIAGNOSIS — I4891 Unspecified atrial fibrillation: Secondary | ICD-10-CM | POA: Diagnosis not present

## 2015-08-28 DIAGNOSIS — D510 Vitamin B12 deficiency anemia due to intrinsic factor deficiency: Secondary | ICD-10-CM | POA: Diagnosis not present

## 2015-08-28 DIAGNOSIS — I951 Orthostatic hypotension: Secondary | ICD-10-CM | POA: Diagnosis not present

## 2015-08-29 DIAGNOSIS — D510 Vitamin B12 deficiency anemia due to intrinsic factor deficiency: Secondary | ICD-10-CM | POA: Diagnosis not present

## 2015-08-29 DIAGNOSIS — E119 Type 2 diabetes mellitus without complications: Secondary | ICD-10-CM | POA: Diagnosis not present

## 2015-08-29 DIAGNOSIS — I4891 Unspecified atrial fibrillation: Secondary | ICD-10-CM | POA: Diagnosis not present

## 2015-08-29 DIAGNOSIS — I951 Orthostatic hypotension: Secondary | ICD-10-CM | POA: Diagnosis not present

## 2015-09-02 DIAGNOSIS — E119 Type 2 diabetes mellitus without complications: Secondary | ICD-10-CM | POA: Diagnosis not present

## 2015-09-02 DIAGNOSIS — I4891 Unspecified atrial fibrillation: Secondary | ICD-10-CM | POA: Diagnosis not present

## 2015-09-02 DIAGNOSIS — I951 Orthostatic hypotension: Secondary | ICD-10-CM | POA: Diagnosis not present

## 2015-09-02 DIAGNOSIS — D510 Vitamin B12 deficiency anemia due to intrinsic factor deficiency: Secondary | ICD-10-CM | POA: Diagnosis not present

## 2015-09-04 ENCOUNTER — Telehealth: Payer: Self-pay | Admitting: Family Medicine

## 2015-09-04 DIAGNOSIS — I4891 Unspecified atrial fibrillation: Secondary | ICD-10-CM | POA: Diagnosis not present

## 2015-09-04 DIAGNOSIS — I951 Orthostatic hypotension: Secondary | ICD-10-CM | POA: Diagnosis not present

## 2015-09-04 DIAGNOSIS — E119 Type 2 diabetes mellitus without complications: Secondary | ICD-10-CM | POA: Diagnosis not present

## 2015-09-04 DIAGNOSIS — D510 Vitamin B12 deficiency anemia due to intrinsic factor deficiency: Secondary | ICD-10-CM | POA: Diagnosis not present

## 2015-09-04 NOTE — Telephone Encounter (Signed)
Gave verbal to [employer]

## 2015-09-04 NOTE — Telephone Encounter (Signed)
   Expand All Collapse All   Lattie Haw from [facility] called would like an order for recertification for skilled nursing X 9 weeks for B12 injections, and home health aide for personal care assistance 3 times a week for 9 weeks. Please call Lattie Haw and leave a voicemail if she does not answer and give a verbal order. Thanks.       ok

## 2015-09-04 NOTE — Telephone Encounter (Signed)
Lattie Haw from [facility] called would like an order for recertification for skilled nursing X 9 weeks for B12 injections, and home health aide for personal care assistance 3 times a week for 9 weeks. Please call Lattie Haw and leave a voicemail if she does not answer and give a verbal order. Thanks.

## 2015-09-06 DIAGNOSIS — I951 Orthostatic hypotension: Secondary | ICD-10-CM | POA: Diagnosis not present

## 2015-09-06 DIAGNOSIS — I4891 Unspecified atrial fibrillation: Secondary | ICD-10-CM | POA: Diagnosis not present

## 2015-09-06 DIAGNOSIS — D510 Vitamin B12 deficiency anemia due to intrinsic factor deficiency: Secondary | ICD-10-CM | POA: Diagnosis not present

## 2015-09-06 DIAGNOSIS — E119 Type 2 diabetes mellitus without complications: Secondary | ICD-10-CM | POA: Diagnosis not present

## 2015-09-09 DIAGNOSIS — D510 Vitamin B12 deficiency anemia due to intrinsic factor deficiency: Secondary | ICD-10-CM | POA: Diagnosis not present

## 2015-09-09 DIAGNOSIS — I4891 Unspecified atrial fibrillation: Secondary | ICD-10-CM | POA: Diagnosis not present

## 2015-09-09 DIAGNOSIS — I951 Orthostatic hypotension: Secondary | ICD-10-CM | POA: Diagnosis not present

## 2015-09-09 DIAGNOSIS — E119 Type 2 diabetes mellitus without complications: Secondary | ICD-10-CM | POA: Diagnosis not present

## 2015-09-11 DIAGNOSIS — I4891 Unspecified atrial fibrillation: Secondary | ICD-10-CM | POA: Diagnosis not present

## 2015-09-11 DIAGNOSIS — D510 Vitamin B12 deficiency anemia due to intrinsic factor deficiency: Secondary | ICD-10-CM | POA: Diagnosis not present

## 2015-09-11 DIAGNOSIS — I951 Orthostatic hypotension: Secondary | ICD-10-CM | POA: Diagnosis not present

## 2015-09-11 DIAGNOSIS — E119 Type 2 diabetes mellitus without complications: Secondary | ICD-10-CM | POA: Diagnosis not present

## 2015-09-13 DIAGNOSIS — I951 Orthostatic hypotension: Secondary | ICD-10-CM | POA: Diagnosis not present

## 2015-09-13 DIAGNOSIS — D510 Vitamin B12 deficiency anemia due to intrinsic factor deficiency: Secondary | ICD-10-CM | POA: Diagnosis not present

## 2015-09-13 DIAGNOSIS — E119 Type 2 diabetes mellitus without complications: Secondary | ICD-10-CM | POA: Diagnosis not present

## 2015-09-13 DIAGNOSIS — I4891 Unspecified atrial fibrillation: Secondary | ICD-10-CM | POA: Diagnosis not present

## 2015-09-16 DIAGNOSIS — I951 Orthostatic hypotension: Secondary | ICD-10-CM | POA: Diagnosis not present

## 2015-09-16 DIAGNOSIS — D510 Vitamin B12 deficiency anemia due to intrinsic factor deficiency: Secondary | ICD-10-CM | POA: Diagnosis not present

## 2015-09-16 DIAGNOSIS — E119 Type 2 diabetes mellitus without complications: Secondary | ICD-10-CM | POA: Diagnosis not present

## 2015-09-16 DIAGNOSIS — I4891 Unspecified atrial fibrillation: Secondary | ICD-10-CM | POA: Diagnosis not present

## 2015-09-18 DIAGNOSIS — I951 Orthostatic hypotension: Secondary | ICD-10-CM | POA: Diagnosis not present

## 2015-09-18 DIAGNOSIS — I4891 Unspecified atrial fibrillation: Secondary | ICD-10-CM | POA: Diagnosis not present

## 2015-09-18 DIAGNOSIS — D510 Vitamin B12 deficiency anemia due to intrinsic factor deficiency: Secondary | ICD-10-CM | POA: Diagnosis not present

## 2015-09-18 DIAGNOSIS — E119 Type 2 diabetes mellitus without complications: Secondary | ICD-10-CM | POA: Diagnosis not present

## 2015-09-19 DIAGNOSIS — D510 Vitamin B12 deficiency anemia due to intrinsic factor deficiency: Secondary | ICD-10-CM | POA: Diagnosis not present

## 2015-09-19 DIAGNOSIS — I4891 Unspecified atrial fibrillation: Secondary | ICD-10-CM | POA: Diagnosis not present

## 2015-09-19 DIAGNOSIS — E119 Type 2 diabetes mellitus without complications: Secondary | ICD-10-CM | POA: Diagnosis not present

## 2015-09-19 DIAGNOSIS — I951 Orthostatic hypotension: Secondary | ICD-10-CM | POA: Diagnosis not present

## 2015-09-23 DIAGNOSIS — I4891 Unspecified atrial fibrillation: Secondary | ICD-10-CM | POA: Diagnosis not present

## 2015-09-23 DIAGNOSIS — D510 Vitamin B12 deficiency anemia due to intrinsic factor deficiency: Secondary | ICD-10-CM | POA: Diagnosis not present

## 2015-09-23 DIAGNOSIS — E119 Type 2 diabetes mellitus without complications: Secondary | ICD-10-CM | POA: Diagnosis not present

## 2015-09-23 DIAGNOSIS — I951 Orthostatic hypotension: Secondary | ICD-10-CM | POA: Diagnosis not present

## 2015-09-24 DIAGNOSIS — I6523 Occlusion and stenosis of bilateral carotid arteries: Secondary | ICD-10-CM | POA: Diagnosis not present

## 2015-09-24 DIAGNOSIS — E785 Hyperlipidemia, unspecified: Secondary | ICD-10-CM | POA: Diagnosis not present

## 2015-09-25 DIAGNOSIS — I4891 Unspecified atrial fibrillation: Secondary | ICD-10-CM | POA: Diagnosis not present

## 2015-09-25 DIAGNOSIS — I951 Orthostatic hypotension: Secondary | ICD-10-CM | POA: Diagnosis not present

## 2015-09-25 DIAGNOSIS — E119 Type 2 diabetes mellitus without complications: Secondary | ICD-10-CM | POA: Diagnosis not present

## 2015-09-25 DIAGNOSIS — D510 Vitamin B12 deficiency anemia due to intrinsic factor deficiency: Secondary | ICD-10-CM | POA: Diagnosis not present

## 2015-09-27 DIAGNOSIS — I951 Orthostatic hypotension: Secondary | ICD-10-CM | POA: Diagnosis not present

## 2015-09-27 DIAGNOSIS — D510 Vitamin B12 deficiency anemia due to intrinsic factor deficiency: Secondary | ICD-10-CM | POA: Diagnosis not present

## 2015-09-27 DIAGNOSIS — E119 Type 2 diabetes mellitus without complications: Secondary | ICD-10-CM | POA: Diagnosis not present

## 2015-09-27 DIAGNOSIS — I4891 Unspecified atrial fibrillation: Secondary | ICD-10-CM | POA: Diagnosis not present

## 2015-09-30 DIAGNOSIS — E119 Type 2 diabetes mellitus without complications: Secondary | ICD-10-CM | POA: Diagnosis not present

## 2015-09-30 DIAGNOSIS — I951 Orthostatic hypotension: Secondary | ICD-10-CM | POA: Diagnosis not present

## 2015-09-30 DIAGNOSIS — D510 Vitamin B12 deficiency anemia due to intrinsic factor deficiency: Secondary | ICD-10-CM | POA: Diagnosis not present

## 2015-09-30 DIAGNOSIS — I4891 Unspecified atrial fibrillation: Secondary | ICD-10-CM | POA: Diagnosis not present

## 2015-10-04 DIAGNOSIS — I4891 Unspecified atrial fibrillation: Secondary | ICD-10-CM | POA: Diagnosis not present

## 2015-10-04 DIAGNOSIS — I83813 Varicose veins of bilateral lower extremities with pain: Secondary | ICD-10-CM | POA: Diagnosis not present

## 2015-10-04 DIAGNOSIS — E119 Type 2 diabetes mellitus without complications: Secondary | ICD-10-CM | POA: Diagnosis not present

## 2015-10-04 DIAGNOSIS — D510 Vitamin B12 deficiency anemia due to intrinsic factor deficiency: Secondary | ICD-10-CM | POA: Diagnosis not present

## 2015-10-04 DIAGNOSIS — I951 Orthostatic hypotension: Secondary | ICD-10-CM | POA: Diagnosis not present

## 2015-10-04 DIAGNOSIS — E785 Hyperlipidemia, unspecified: Secondary | ICD-10-CM | POA: Diagnosis not present

## 2015-10-05 DIAGNOSIS — D510 Vitamin B12 deficiency anemia due to intrinsic factor deficiency: Secondary | ICD-10-CM | POA: Diagnosis not present

## 2015-10-05 DIAGNOSIS — E119 Type 2 diabetes mellitus without complications: Secondary | ICD-10-CM | POA: Diagnosis not present

## 2015-10-05 DIAGNOSIS — I951 Orthostatic hypotension: Secondary | ICD-10-CM | POA: Diagnosis not present

## 2015-10-05 DIAGNOSIS — I4891 Unspecified atrial fibrillation: Secondary | ICD-10-CM | POA: Diagnosis not present

## 2015-10-07 DIAGNOSIS — E119 Type 2 diabetes mellitus without complications: Secondary | ICD-10-CM | POA: Diagnosis not present

## 2015-10-07 DIAGNOSIS — I4891 Unspecified atrial fibrillation: Secondary | ICD-10-CM | POA: Diagnosis not present

## 2015-10-07 DIAGNOSIS — I951 Orthostatic hypotension: Secondary | ICD-10-CM | POA: Diagnosis not present

## 2015-10-07 DIAGNOSIS — D510 Vitamin B12 deficiency anemia due to intrinsic factor deficiency: Secondary | ICD-10-CM | POA: Diagnosis not present

## 2015-10-08 ENCOUNTER — Telehealth: Payer: Self-pay | Admitting: Family Medicine

## 2015-10-08 DIAGNOSIS — E785 Hyperlipidemia, unspecified: Secondary | ICD-10-CM | POA: Diagnosis not present

## 2015-10-08 DIAGNOSIS — I8310 Varicose veins of unspecified lower extremity with inflammation: Secondary | ICD-10-CM | POA: Diagnosis not present

## 2015-10-08 MED ORDER — CYANOCOBALAMIN 1000 MCG/ML IJ SOLN: 1000.0000 ug | INTRAMUSCULAR | Status: DC

## 2015-10-08 NOTE — Telephone Encounter (Signed)
Pt called stated she has changed pharmacies. She needs a new RX for B 12 sent to [facility] on [address] in [place]. Thanks.

## 2015-10-09 DIAGNOSIS — E119 Type 2 diabetes mellitus without complications: Secondary | ICD-10-CM | POA: Diagnosis not present

## 2015-10-09 DIAGNOSIS — I4891 Unspecified atrial fibrillation: Secondary | ICD-10-CM | POA: Diagnosis not present

## 2015-10-09 DIAGNOSIS — I951 Orthostatic hypotension: Secondary | ICD-10-CM | POA: Diagnosis not present

## 2015-10-09 DIAGNOSIS — D510 Vitamin B12 deficiency anemia due to intrinsic factor deficiency: Secondary | ICD-10-CM | POA: Diagnosis not present

## 2015-10-10 DIAGNOSIS — D510 Vitamin B12 deficiency anemia due to intrinsic factor deficiency: Secondary | ICD-10-CM | POA: Diagnosis not present

## 2015-10-10 DIAGNOSIS — E119 Type 2 diabetes mellitus without complications: Secondary | ICD-10-CM | POA: Diagnosis not present

## 2015-10-10 DIAGNOSIS — I951 Orthostatic hypotension: Secondary | ICD-10-CM | POA: Diagnosis not present

## 2015-10-10 DIAGNOSIS — I4891 Unspecified atrial fibrillation: Secondary | ICD-10-CM | POA: Diagnosis not present

## 2015-10-14 ENCOUNTER — Other Ambulatory Visit: Payer: Self-pay | Admitting: Family Medicine

## 2015-10-14 ENCOUNTER — Telehealth: Payer: Self-pay | Admitting: Family Medicine

## 2015-10-14 DIAGNOSIS — D510 Vitamin B12 deficiency anemia due to intrinsic factor deficiency: Secondary | ICD-10-CM | POA: Diagnosis not present

## 2015-10-14 DIAGNOSIS — I4891 Unspecified atrial fibrillation: Secondary | ICD-10-CM | POA: Diagnosis not present

## 2015-10-14 DIAGNOSIS — E119 Type 2 diabetes mellitus without complications: Secondary | ICD-10-CM | POA: Diagnosis not present

## 2015-10-14 DIAGNOSIS — I951 Orthostatic hypotension: Secondary | ICD-10-CM | POA: Diagnosis not present

## 2015-10-14 MED ORDER — FLUTICASONE PROPIONATE 50 MCG/ACT NA SUSP: 2.0000 | Freq: Every day | NASAL | Status: AC

## 2015-10-14 NOTE — Telephone Encounter (Signed)
Looks like 3 months were sent in on that B12 - should have 12 injections at the pharmacy per her med list. Will go ahead and send over the flonase refill. Thanks!

## 2015-10-14 NOTE — Telephone Encounter (Signed)
Pt called stated Dr. Jeananne Rama only ordered a one month supply of B12. Pt would like to know if a 3 month supply can be called in to [facility] on [address]. Pt also needs her sinus nose spray called in as well. Thanks.

## 2015-10-16 DIAGNOSIS — E119 Type 2 diabetes mellitus without complications: Secondary | ICD-10-CM | POA: Diagnosis not present

## 2015-10-16 DIAGNOSIS — D510 Vitamin B12 deficiency anemia due to intrinsic factor deficiency: Secondary | ICD-10-CM | POA: Diagnosis not present

## 2015-10-16 DIAGNOSIS — I951 Orthostatic hypotension: Secondary | ICD-10-CM | POA: Diagnosis not present

## 2015-10-16 DIAGNOSIS — I4891 Unspecified atrial fibrillation: Secondary | ICD-10-CM | POA: Diagnosis not present

## 2015-10-18 DIAGNOSIS — D510 Vitamin B12 deficiency anemia due to intrinsic factor deficiency: Secondary | ICD-10-CM | POA: Diagnosis not present

## 2015-10-18 DIAGNOSIS — I4891 Unspecified atrial fibrillation: Secondary | ICD-10-CM | POA: Diagnosis not present

## 2015-10-18 DIAGNOSIS — I951 Orthostatic hypotension: Secondary | ICD-10-CM | POA: Diagnosis not present

## 2015-10-18 DIAGNOSIS — E119 Type 2 diabetes mellitus without complications: Secondary | ICD-10-CM | POA: Diagnosis not present

## 2015-10-21 DIAGNOSIS — E119 Type 2 diabetes mellitus without complications: Secondary | ICD-10-CM | POA: Diagnosis not present

## 2015-10-21 DIAGNOSIS — I951 Orthostatic hypotension: Secondary | ICD-10-CM | POA: Diagnosis not present

## 2015-10-21 DIAGNOSIS — I4891 Unspecified atrial fibrillation: Secondary | ICD-10-CM | POA: Diagnosis not present

## 2015-10-21 DIAGNOSIS — D510 Vitamin B12 deficiency anemia due to intrinsic factor deficiency: Secondary | ICD-10-CM | POA: Diagnosis not present

## 2015-10-23 DIAGNOSIS — I4891 Unspecified atrial fibrillation: Secondary | ICD-10-CM | POA: Diagnosis not present

## 2015-10-23 DIAGNOSIS — I951 Orthostatic hypotension: Secondary | ICD-10-CM | POA: Diagnosis not present

## 2015-10-23 DIAGNOSIS — D510 Vitamin B12 deficiency anemia due to intrinsic factor deficiency: Secondary | ICD-10-CM | POA: Diagnosis not present

## 2015-10-23 DIAGNOSIS — E119 Type 2 diabetes mellitus without complications: Secondary | ICD-10-CM | POA: Diagnosis not present

## 2015-10-25 DIAGNOSIS — D510 Vitamin B12 deficiency anemia due to intrinsic factor deficiency: Secondary | ICD-10-CM | POA: Diagnosis not present

## 2015-10-25 DIAGNOSIS — E119 Type 2 diabetes mellitus without complications: Secondary | ICD-10-CM | POA: Diagnosis not present

## 2015-10-25 DIAGNOSIS — I4891 Unspecified atrial fibrillation: Secondary | ICD-10-CM | POA: Diagnosis not present

## 2015-10-25 DIAGNOSIS — I951 Orthostatic hypotension: Secondary | ICD-10-CM | POA: Diagnosis not present

## 2015-10-28 DIAGNOSIS — I951 Orthostatic hypotension: Secondary | ICD-10-CM | POA: Diagnosis not present

## 2015-10-28 DIAGNOSIS — I4891 Unspecified atrial fibrillation: Secondary | ICD-10-CM | POA: Diagnosis not present

## 2015-10-28 DIAGNOSIS — D510 Vitamin B12 deficiency anemia due to intrinsic factor deficiency: Secondary | ICD-10-CM | POA: Diagnosis not present

## 2015-10-28 DIAGNOSIS — E119 Type 2 diabetes mellitus without complications: Secondary | ICD-10-CM | POA: Diagnosis not present

## 2015-10-30 DIAGNOSIS — E119 Type 2 diabetes mellitus without complications: Secondary | ICD-10-CM | POA: Diagnosis not present

## 2015-10-30 DIAGNOSIS — I951 Orthostatic hypotension: Secondary | ICD-10-CM | POA: Diagnosis not present

## 2015-10-30 DIAGNOSIS — I4891 Unspecified atrial fibrillation: Secondary | ICD-10-CM | POA: Diagnosis not present

## 2015-10-30 DIAGNOSIS — D510 Vitamin B12 deficiency anemia due to intrinsic factor deficiency: Secondary | ICD-10-CM | POA: Diagnosis not present

## 2015-11-01 DIAGNOSIS — E119 Type 2 diabetes mellitus without complications: Secondary | ICD-10-CM | POA: Diagnosis not present

## 2015-11-01 DIAGNOSIS — I951 Orthostatic hypotension: Secondary | ICD-10-CM | POA: Diagnosis not present

## 2015-11-01 DIAGNOSIS — I4891 Unspecified atrial fibrillation: Secondary | ICD-10-CM | POA: Diagnosis not present

## 2015-11-01 DIAGNOSIS — D510 Vitamin B12 deficiency anemia due to intrinsic factor deficiency: Secondary | ICD-10-CM | POA: Diagnosis not present

## 2015-11-04 DIAGNOSIS — I4891 Unspecified atrial fibrillation: Secondary | ICD-10-CM | POA: Diagnosis not present

## 2015-11-04 DIAGNOSIS — E119 Type 2 diabetes mellitus without complications: Secondary | ICD-10-CM | POA: Diagnosis not present

## 2015-11-04 DIAGNOSIS — D510 Vitamin B12 deficiency anemia due to intrinsic factor deficiency: Secondary | ICD-10-CM | POA: Diagnosis not present

## 2015-11-04 DIAGNOSIS — I951 Orthostatic hypotension: Secondary | ICD-10-CM | POA: Diagnosis not present

## 2015-11-05 DIAGNOSIS — I951 Orthostatic hypotension: Secondary | ICD-10-CM | POA: Diagnosis not present

## 2015-11-05 DIAGNOSIS — D510 Vitamin B12 deficiency anemia due to intrinsic factor deficiency: Secondary | ICD-10-CM | POA: Diagnosis not present

## 2015-11-05 DIAGNOSIS — I4891 Unspecified atrial fibrillation: Secondary | ICD-10-CM | POA: Diagnosis not present

## 2015-11-05 DIAGNOSIS — E119 Type 2 diabetes mellitus without complications: Secondary | ICD-10-CM | POA: Diagnosis not present

## 2015-11-06 ENCOUNTER — Telehealth: Payer: Self-pay | Admitting: Family Medicine

## 2015-11-06 DIAGNOSIS — I951 Orthostatic hypotension: Secondary | ICD-10-CM | POA: Diagnosis not present

## 2015-11-06 DIAGNOSIS — I4891 Unspecified atrial fibrillation: Secondary | ICD-10-CM | POA: Diagnosis not present

## 2015-11-06 DIAGNOSIS — E119 Type 2 diabetes mellitus without complications: Secondary | ICD-10-CM | POA: Diagnosis not present

## 2015-11-06 DIAGNOSIS — D510 Vitamin B12 deficiency anemia due to intrinsic factor deficiency: Secondary | ICD-10-CM | POA: Diagnosis not present

## 2015-11-06 NOTE — Telephone Encounter (Signed)
Verbal ok given to Mia Padilla.

## 2015-11-06 NOTE — Telephone Encounter (Signed)
Lattie Haw from [facility] called stated she needs a verbal to continue home health aide 3 times per week every week, and skilled nursing every other week for nine weeks. Please call Lattie Haw ASAP. Thanks.

## 2015-11-06 NOTE — Telephone Encounter (Signed)
Ok to give verbal order.

## 2015-11-06 NOTE — Telephone Encounter (Signed)
Called and gave verbal OK to [place].

## 2015-11-06 NOTE — Telephone Encounter (Signed)
Routing to provider  

## 2015-11-11 ENCOUNTER — Encounter: Payer: Self-pay | Admitting: Family Medicine

## 2015-11-11 ENCOUNTER — Ambulatory Visit (INDEPENDENT_AMBULATORY_CARE_PROVIDER_SITE_OTHER): Payer: Medicare Other | Admitting: Family Medicine

## 2015-11-11 VITALS — BP 108/60 | HR 81 | Temp 98.0°F | Ht 62.2 in | Wt 196.0 lb

## 2015-11-11 DIAGNOSIS — Z0000 Encounter for general adult medical examination without abnormal findings: Secondary | ICD-10-CM | POA: Diagnosis not present

## 2015-11-11 DIAGNOSIS — M791 Myalgia, unspecified site: Secondary | ICD-10-CM

## 2015-11-11 DIAGNOSIS — I739 Peripheral vascular disease, unspecified: Secondary | ICD-10-CM | POA: Diagnosis not present

## 2015-11-11 DIAGNOSIS — C50412 Malignant neoplasm of upper-outer quadrant of left female breast: Secondary | ICD-10-CM

## 2015-11-11 DIAGNOSIS — E039 Hypothyroidism, unspecified: Secondary | ICD-10-CM | POA: Diagnosis not present

## 2015-11-11 DIAGNOSIS — I10 Essential (primary) hypertension: Secondary | ICD-10-CM | POA: Diagnosis not present

## 2015-11-11 DIAGNOSIS — E669 Obesity, unspecified: Secondary | ICD-10-CM

## 2015-11-11 DIAGNOSIS — E1151 Type 2 diabetes mellitus with diabetic peripheral angiopathy without gangrene: Secondary | ICD-10-CM | POA: Diagnosis not present

## 2015-11-11 DIAGNOSIS — J432 Centrilobular emphysema: Secondary | ICD-10-CM

## 2015-11-11 DIAGNOSIS — I4891 Unspecified atrial fibrillation: Secondary | ICD-10-CM

## 2015-11-11 DIAGNOSIS — I5033 Acute on chronic diastolic (congestive) heart failure: Secondary | ICD-10-CM

## 2015-11-11 DIAGNOSIS — R569 Unspecified convulsions: Secondary | ICD-10-CM

## 2015-11-11 DIAGNOSIS — N393 Stress incontinence (female) (male): Secondary | ICD-10-CM

## 2015-11-11 DIAGNOSIS — E538 Deficiency of other specified B group vitamins: Secondary | ICD-10-CM | POA: Diagnosis not present

## 2015-11-11 DIAGNOSIS — M19042 Primary osteoarthritis, left hand: Secondary | ICD-10-CM | POA: Diagnosis not present

## 2015-11-11 DIAGNOSIS — E559 Vitamin D deficiency, unspecified: Secondary | ICD-10-CM | POA: Diagnosis not present

## 2015-11-11 MED ORDER — NYSTATIN 100000 UNIT/GM EX POWD: Freq: Four times a day (QID) | CUTANEOUS | 0 refills | Status: DC

## 2015-11-11 NOTE — Assessment & Plan Note (Signed)
Continue to follow with cardiology. Continue current regimen. Continue to monitor.

## 2015-11-11 NOTE — Progress Notes (Signed)
BP 108/60 (BP Location: Right Arm, Patient Position: Sitting, Cuff Size: Large)   Pulse 81   Temp 98 F (36.7 C)   Ht 5' 2.2" (1.58 m)   Wt 196 lb (88.9 kg)   SpO2 98%   BMI 35.62 kg/m    Subjective:    Patient ID: Mia Padilla, female    DOB: 06-07-1934, 81 y.o.   MRN: [id number]  HPI: Mia Padilla is a 81 y.o. female presenting on 11/11/2015 for comprehensive medical examination. Current medical complaints include:  DIABETES Hypoglycemic episodes:no Polydipsia/polyuria: no Visual disturbance: yes- known glaucoma, seeing eye doctor Chest pain: no Paresthesias: yes- L hand only due to known neuropathy Glucose Monitoring: no Taking Insulin?: yes Blood Pressure Monitoring: not checking Retinal Examination: Up to Date Foot Exam: Up to Date Diabetic Education: Completed Pneumovax: Up to Date Influenza: Up to Date Aspirin: yes  HYPOTHYROIDISM Thyroid control status:controlled Satisfied with current treatment? yes Medication side effects: no Medication compliance: excellent compliance Recent dose adjustment:no Fatigue: no Cold intolerance: no Heat intolerance: no Weight gain: no Weight loss: no Constipation: no Diarrhea/loose stools: no Palpitations: no Lower extremity edema: yes Anxiety/depressed mood: yes  She currently lives with: With her niece Menopausal Symptoms: no  Functional Status Survey: Is the patient deaf or have difficulty hearing?: No Does the patient have difficulty seeing, even when wearing glasses/contacts?: Yes Does the patient have difficulty concentrating, remembering, or making decisions?: Yes Does the patient have difficulty walking or climbing stairs?: Yes Does the patient have difficulty dressing or bathing?: Yes Does the patient have difficulty doing errands alone such as visiting a doctor's office or shopping?: Yes  Fall Risk  11/11/2015 11/11/2015 11/01/2014  Falls in the past year? No No No    Depression Screen Depression screen  [facility] 2/9 11/11/2015 11/11/2015 11/01/2014  Decreased Interest 1 0 0  Down, Depressed, Hopeless 1 1 0  PHQ - 2 Score 2 1 0  Altered sleeping 2 - -  Tired, decreased energy 1 - -  Change in appetite 0 - -  Feeling bad or failure about yourself  0 - -  Trouble concentrating 1 - -  Moving slowly or fidgety/restless 0 - -  Suicidal thoughts 1 - -  PHQ-9 Score 7 - -  Difficult doing work/chores Somewhat difficult - -   Advanced Directives Does patient have a HCPOA?    no Does patient have a living will or MOST form?  no  Past Medical History:  Past Medical History:  Diagnosis Date  . Arthritis    hands  . Black head   . Bowel incontinence   . Breast cancer of upper-outer quadrant of left female breast ([place]) 2015  . Carpal tunnel syndrome   . Debilitated   . Glaucoma   . History of seizures   . Hyperlipidemia   . Hypertension   . Hypotension   . Obesity   . Urinary incontinence     Surgical History:  Past Surgical History:  Procedure Laterality Date  . BOWEL RESECTION  2005  . BREAST SURGERY  08/03/13   left mastectomy  . CARPAL TUNNEL RELEASE Right 30 years   . MASTECTOMY     left   . PARTIAL HYSTERECTOMY    . TONSILLECTOMY    . UMBILICAL HERNIA REPAIR      Medications:  Current Outpatient Prescriptions on File Prior to Visit  Medication Sig  . acetaminophen (TYLENOL) 500 MG tablet Take 500 mg by mouth every 6 (six)  hours as needed.  Marland Kitchen albuterol (PROVENTIL HFA;VENTOLIN HFA) 108 (90 Base) MCG/ACT inhaler Inhale 2 puffs into the lungs every 6 (six) hours as needed.  Marland Kitchen alendronate (FOSAMAX) 70 MG tablet Take 1 tablet (70 mg total) by mouth once a week. Take with a full glass of water on an empty stomach.  Marland Kitchen aspirin EC 81 MG tablet Take 81 mg by mouth daily.  . canagliflozin (INVOKANA) 100 MG TABS tablet Take 1 tablet (100 mg total) by mouth daily.  . clopidogrel (PLAVIX) 75 MG tablet Take 1 tablet (75 mg total) by mouth daily.  . cyanocobalamin (,VITAMIN B-12,) 1000  MCG/ML injection Inject 1 mL (1,000 mcg total) into the muscle every 30 (thirty) days.  . digoxin (LANOXIN) 0.125 MG tablet Take 1 tablet (125 mcg total) by mouth daily.  . fluticasone (FLONASE) 50 MCG/ACT nasal spray Place 2 sprays into both nostrils daily.  . Fluticasone-Salmeterol (ADVAIR) 250-50 MCG/DOSE AEPB Inhale 1 puff into the lungs every 12 (twelve) hours.  Marland Kitchen LEVEMIR FLEXTOUCH 100 UNIT/ML Pen INJECT 23 TO 30 UNITS PER DAY  . levothyroxine (SYNTHROID, LEVOTHROID) 75 MCG tablet Take 1 tablet (75 mcg total) by mouth daily.  Marland Kitchen lisinopril (PRINIVIL,ZESTRIL) 10 MG tablet Take 1 tablet (10 mg total) by mouth daily.  . meclizine (ANTIVERT) 25 MG tablet Take 1 tablet (25 mg total) by mouth 3 (three) times daily as needed.  . metFORMIN (GLUCOPHAGE) 500 MG tablet Take 1 tablet (500 mg total) by mouth 2 (two) times daily with a meal.  . metoprolol succinate (TOPROL-XL) 100 MG 24 hr tablet Take 1 tablet (100 mg total) by mouth daily.  . mometasone (ELOCON) 0.1 % cream Apply 1 application topically daily. Do not use day in and day out  . mupirocin ointment (BACTROBAN) 2 % Apply 1 application topically 2 (two) times daily. Do not use day in and day out  . Olopatadine HCl (PATADAY) 0.2 % SOLN Apply to eye daily.  . potassium chloride (K-DUR) 10 MEQ tablet Take 1 tablet (10 mEq total) by mouth daily as needed.  . solifenacin (VESICARE) 5 MG tablet Take 2 tablets (10 mg total) by mouth daily.  . travoprost, benzalkonium, (TRAVATAN) 0.004 % ophthalmic solution Apply 1 drop to eye at bedtime.  Marland Kitchen anastrozole (ARIMIDEX) 1 MG tablet Take 1 tablet (1 mg total) by mouth daily.  . blood glucose meter kit and supplies Diagnosis:E11.9 Test glucose twice daily  . brimonidine (ALPHAGAN P) 0.1 % SOLN 2 (two) times daily.   . dorzolamide (TRUSOPT) 2 % ophthalmic solution 1 drop 2 (two) times daily.   Marland Kitchen glucose blood test strip Use as instructed   No current facility-administered medications on file prior to visit.       Allergies:  Allergies  Allergen Reactions  . Propoxyphene Other (See Comments)    Other Reaction: Severe Headache  . Darvocet [Propoxyphene N-Acetaminophen]   . Percocet [Oxycodone-Acetaminophen]     hallucination    Social History:  Social History   Social History  . Marital status: Divorced    Spouse name: N/A  . Number of children: N/A  . Years of education: N/A   Occupational History  . Not on file.   Social History Main Topics  . Smoking status: Former Games developer  . Smokeless tobacco: Never Used  . Alcohol use No  . Drug use: No  . Sexual activity: Not on file   Other Topics Concern  . Not on file   Social History Narrative  . No  narrative on file   History  Smoking Status  . Former Smoker  Smokeless Tobacco  . Never Used   History  Alcohol Use No    Family History:  Family History  Problem Relation Age of Onset  . Cervical cancer Mother   . Diabetes Mother   . Glaucoma Mother   . Cervical cancer Daughter   . Hypertension Daughter   . Hypertension Father   . Hypertension Sister   . Hypertension Brother   . Hypertension Son   . Diabetes Brother   . HIV Brother     Past medical history, surgical history, medications, allergies, family history and social history reviewed with patient today and changes made to appropriate areas of the chart.   Review of Systems  Constitutional: Negative.   HENT: Negative.   Eyes: Positive for blurred vision. Negative for double vision, photophobia, pain, discharge and redness.  Respiratory: Positive for shortness of breath and wheezing. Negative for cough, hemoptysis and sputum production.   Cardiovascular: Positive for leg swelling. Negative for chest pain, palpitations, orthopnea, claudication and PND.  Gastrointestinal: Positive for diarrhea and nausea. Negative for abdominal pain, blood in stool, constipation, heartburn, melena and vomiting.  Genitourinary: Positive for urgency. Negative for dysuria, flank  pain, frequency and hematuria.  Musculoskeletal: Negative.   Skin: Positive for rash. Negative for itching.  Neurological: Negative.   Endo/Heme/Allergies: Negative.   Psychiatric/Behavioral: Positive for depression (since her son moved away, getting better). Negative for hallucinations, memory loss, substance abuse and suicidal ideas. The patient is not nervous/anxious and does not have insomnia.    All other ROS negative except what is listed above and in the HPI.      Objective:    BP 108/60 (BP Location: Right Arm, Patient Position: Sitting, Cuff Size: Large)   Pulse 81   Temp 98 F (36.7 C)   Ht 5' 2.2" (1.58 m)   Wt 196 lb (88.9 kg)   SpO2 98%   BMI 35.62 kg/m   Wt Readings from Last 3 Encounters:  11/11/15 196 lb (88.9 kg)  08/21/15 196 lb (88.9 kg)  08/05/15 197 lb (89.4 kg)     Hearing Screening   '125Hz'$  '250Hz'$  '500Hz'$  '1000Hz'$  '2000Hz'$  '3000Hz'$  '4000Hz'$  '6000Hz'$  '8000Hz'$   Right ear:   '20 20 20  20    '$ Left ear:   '20 20 20  20      '$ Visual Acuity Screening   Right eye Left eye Both eyes  Without correction:     With correction: '20/70 20/50 20/50 '$    Physical Exam  Constitutional: She is oriented to person, place, and time. She appears well-developed and well-nourished. No distress.  HENT:  Head: Normocephalic and atraumatic.  Right Ear: Hearing, tympanic membrane, external ear and ear canal normal.  Left Ear: Hearing, tympanic membrane, external ear and ear canal normal.  Nose: Nose normal.  Mouth/Throat: Uvula is midline, oropharynx is clear and moist and mucous membranes are normal. No oropharyngeal exudate.  Eyes: Conjunctivae, EOM and lids are normal. Pupils are equal, round, and reactive to light. Right eye exhibits no discharge. Left eye exhibits no discharge. No scleral icterus.  Neck: Normal range of motion. Neck supple. No JVD present. No tracheal deviation present. No thyromegaly present.  Cardiovascular: Normal rate, normal heart sounds and intact distal pulses.  An  irregularly irregular rhythm present.  Pulmonary/Chest: Effort normal. No stridor. No respiratory distress. She has wheezes (on the L'). She has no rales. She exhibits no tenderness.  Abdominal:  Soft. Bowel sounds are normal. She exhibits no distension and no mass. There is no tenderness. There is no rebound and no guarding.  Genitourinary:  Genitourinary Comments: Deferred with shared decision making Breast exam deferred- done by Dr. Jamal Collin  Musculoskeletal: Normal range of motion. She exhibits edema (trace bilaterally). She exhibits no tenderness or deformity.  Lymphadenopathy:    She has no cervical adenopathy.  Neurological: She is alert and oriented to person, place, and time. She has normal reflexes. She displays normal reflexes. No cranial nerve deficit. She exhibits normal muscle tone. Coordination normal.  Skin: Skin is warm, dry and intact. Rash (erythematous excoraited area in umbilicus on the L) noted. She is not diaphoretic. No erythema. No pallor.  Psychiatric: She has a normal mood and affect. Her speech is normal and behavior is normal. Judgment and thought content normal. Cognition and memory are normal.  Nursing note and vitals reviewed.   Cognitive Testing - 6-CIT  Correct? Score   What year is it? yes 0 Yes = 0    No = 4  What month is it? yes 0 Yes = 0    No = 3  Remember:     Pia Mau, [place], [place]     What time is it? yes 0 Yes = 0    No = 3  Count backwards from 20 to 1 yes 0 Correct = 0    1 error = 2   More than 1 error = 4  Say the months of the year in reverse. yes 0 Correct = 0    1 error = 2   More than 1 error = 4  What address did I ask you to remember? yes 2 Correct = 0  1 error = 2    2 error = 4    3 error = 6    4 error = 8    All wrong = 10       TOTAL SCORE  2/28   Interpretation:  Normal  Normal (0-7) Abnormal (8-28)   Results for orders placed or performed in visit on 11/11/15  Microscopic Examination  Result Value Ref Range   WBC,  UA 0-5 0 - 5 /hpf   RBC, UA 0-2 0 - 2 /hpf   Epithelial Cells (non renal) 0-10 0 - 10 /hpf   Bacteria, UA Moderate (A) None seen/Few  Bayer DCA Hb A1c Waived  Result Value Ref Range   Bayer DCA Hb A1c Waived 6.6 <7.0 %  Microalbumin, Urine Waived  Result Value Ref Range   Microalb, Ur Waived 30 (H) 0 - 19 mg/L   Creatinine, Urine Waived 50 10 - 300 mg/dL   Microalb/Creat Ratio 30-300 (H) <30 mg/g  UA/M w/rflx Culture, Routine  Result Value Ref Range   Specific Gravity, UA 1.020 1.005 - 1.030   pH, UA 5.5 5.0 - 7.5   Color, UA Yellow Yellow   Appearance Ur Clear Clear   Leukocytes, UA 1+ (A) Negative   Protein, UA Negative Negative/Trace   Glucose, UA Negative Negative   Ketones, UA Negative Negative   RBC, UA Trace (A) Negative   Bilirubin, UA Negative Negative   Urobilinogen, Ur 1.0 0.2 - 1.0 mg/dL   Nitrite, UA Positive (A) Negative   Microscopic Examination See below:    Urinalysis Reflex Comment   Urine Culture, Routine  Result Value Ref Range   Urine Culture, Routine WILL FOLLOW       Assessment &  Plan:   Problem List Items Addressed This Visit      Cardiovascular and Mediastinum   A-fib (HCC)    Continue to follow with cardiology. Continue current regimen. Continue to monitor.       Relevant Orders   Comprehensive metabolic panel   Hypertension    Better on recheck. Continue current regimen. Continue to monitor. Call with any concerns.       Relevant Orders   Comprehensive metabolic panel   Microalbumin, Urine Waived (Completed)   Diastolic CHF (HCC)    Continue to follow with cardiology. Continue current regimen. Continue to monitor.       Peripheral vascular disease (HCC)    Continue to follow with vascular. Continue current regimen. Continue to monitor.         Respiratory   COPD (chronic obstructive pulmonary disease) (HCC)    Under fair control. Continue current regimen. Continue to monitor. Call with any concerns.         Digestive   B12  deficiency    Rechecking levels again today. Await results. Treat as needed.       Relevant Orders   CBC with Differential/Platelet   Comprehensive metabolic panel   O83 and Folate Panel     Endocrine   Diabetes mellitus (HCC)    Under good control at 6.6. Continue current regimen and continue to monitor. Call with any concerns.       Relevant Orders   Bayer DCA Hb A1c Waived (Completed)   Comprehensive metabolic panel   Lipid Panel w/o Chol/HDL Ratio   Hypothyroidism    Rechecking levels again today. Await results. Treat as needed.       Relevant Orders   Comprehensive metabolic panel   TSH     Musculoskeletal and Integument   Osteoarthritis    Stable on current regimen. Continue current regimen. Continue to monitor.        Other   Obese    Continue to work on diet and exercise. Continue to monitor.       Relevant Orders   Comprehensive metabolic panel   Seizures (HCC)    No problems recently. Continue to monitor.       Malignant neoplasm of upper-outer quadrant of female breast (HCC)    Continue to follow with Dr. Evette Cristal.       Urinary incontinence    +Leuks and RBCs. Will await culture and treat as needed.       Relevant Orders   Comprehensive metabolic panel   UA/M w/rflx Culture, Routine (Completed)    Other Visit Diagnoses    Medicare annual wellness visit, subsequent    -  Primary   Preventative care discussed. Labs ordered. Continue to monitor.    Myalgia       Will check labs. Await results. Continue to monitor.    Relevant Orders   VITAMIN D 25 Hydroxy (Vit-D Deficiency, Fractures)       Preventative Services:  Health Risk Assessment and Personalized Prevention Plan: See below Bone Mass Measurements: up to date Breast Cancer Screening: up to date CVD Screening: done today Cervical Cancer Screening: N/A Colon Cancer Screening: up to date Depression Screening: Done today Diabetes Screening: Up to date Glaucoma Screening: continue to  foll Hepatitis B vaccine: N/A Hepatitis C screening: N/A HIV Screening: Declined Flu Vaccine: postpone to flu season Lung cancer Screening: N/A Obesity Screening: Done today Pneumonia Vaccines (2): up to date STI Screening: N/A  Follow up plan: Return in about  3 months (around 02/11/2016) for DM visit with MAC.   LABORATORY TESTING:  - Pap smear: not applicable  IMMUNIZATIONS:   - Tdap: Tetanus vaccination status reviewed: last tetanus booster within 10 years. - Influenza: Postponed to flu season - Pneumovax: Up to date - Prevnar: Up to date  SCREENING: -Mammogram: Done elsewhere  - Colonoscopy: Up to date  - Bone Density: Up to date  -Hearing Test: Ordered today  -Spirometry: Not applicable   PATIENT COUNSELING:   Advised to take 1 mg of folate supplement per day if capable of pregnancy.   Sexuality: Discussed sexually transmitted diseases, partner selection, use of condoms, avoidance of unintended pregnancy  and contraceptive alternatives.   Advised to avoid cigarette smoking.  I discussed with the patient that most people either abstain from alcohol or drink within safe limits (<=14/week and <=4 drinks/occasion for males, <=7/weeks and <= 3 drinks/occasion for females) and that the risk for alcohol disorders and other health effects rises proportionally with the number of drinks per week and how often a drinker exceeds daily limits.  Discussed cessation/primary prevention of drug use and availability of treatment for abuse.   Diet: Encouraged to adjust caloric intake to maintain  or achieve ideal body weight, to reduce intake of dietary saturated fat and total fat, to limit sodium intake by avoiding high sodium foods and not adding table salt, and to maintain adequate dietary potassium and calcium preferably from fresh fruits, vegetables, and low-fat dairy products.    stressed the importance of regular exercise  Injury prevention: Discussed safety belts, safety  helmets, smoke detector, smoking near bedding or upholstery.   Dental health: Discussed importance of regular tooth brushing, flossing, and dental visits.    NEXT PREVENTATIVE PHYSICAL DUE IN 1 YEAR. Return in about 3 months (around 02/11/2016) for DM visit with MAC.

## 2015-11-11 NOTE — Assessment & Plan Note (Signed)
Continue to follow with Dr. Jamal Collin.

## 2015-11-11 NOTE — Assessment & Plan Note (Signed)
Rechecking levels again today. Await results. Treat as needed.  

## 2015-11-11 NOTE — Assessment & Plan Note (Signed)
Continue to follow with vascular. Continue current regimen. Continue to monitor.

## 2015-11-11 NOTE — Assessment & Plan Note (Signed)
Better on recheck. Continue current regimen. Continue to monitor. Call with any concerns.  

## 2015-11-11 NOTE — Assessment & Plan Note (Signed)
Continue to work on diet and exercise. Continue to monitor.

## 2015-11-11 NOTE — Assessment & Plan Note (Signed)
No problems recently. Continue to monitor.

## 2015-11-11 NOTE — Assessment & Plan Note (Signed)
Under fair control. Continue current regimen. Continue to monitor. Call with any concerns.  

## 2015-11-11 NOTE — Assessment & Plan Note (Signed)
Under good control at 6.6. Continue current regimen and continue to monitor. Call with any concerns.

## 2015-11-11 NOTE — Patient Instructions (Addendum)
Preventative Services:  Health Risk Assessment and Personalized Prevention Plan: See below Bone Mass Measurements: up to date Breast Cancer Screening: up to date CVD Screening: done today Cervical Cancer Screening: N/A Colon Cancer Screening: up to date Depression Screening: Done today Diabetes Screening: Up to date Glaucoma Screening: continue to foll Hepatitis B vaccine: N/A Hepatitis C screening: N/A HIV Screening: Declined Flu Vaccine: postpone to flu season Lung cancer Screening: N/A Obesity Screening: Done today Pneumonia Vaccines (2): up to date STI Screening: N/A  Advance Directive Advance directives are the legal documents that allow you to make choices about your health care and medical treatment if you cannot speak for yourself. Advance directives are a way for you to communicate your wishes to family, friends, and health care providers. The specified people can then convey your decisions about end-of-life care to avoid confusion if you should become unable to communicate. Ideally, the process of discussing and writing advance directives should happen over time rather than making decisions all at once. Advance directives can be modified as your situation changes, and you can change your mind at any time, even after you have signed the advance directives. Each state has its own laws regarding advance directives. You may want to check with your health care provider, attorney, or state representative about the law in your state. Below are some examples of advance directives. LIVING WILL A living will is a set of instructions documenting your wishes about medical care when you cannot care for yourself. It is used if you become:  Terminally ill.  Incapacitated.  Unable to communicate.  Unable to make decisions. Items to consider in your living will include:  The use or non-use of life-sustaining equipment, such as dialysis machines and breathing machines (ventilators).  A  do not resuscitate (DNR) order, which is the instruction not to use cardiopulmonary resuscitation (CPR) if breathing or heartbeat stops.  Tube feeding.  Withholding of food and fluids.  Comfort (palliative) care when the goal becomes comfort rather than a cure.  Organ and tissue donation. A living will does not give instructions about distribution of your money and property if you should pass away. It is advisable to seek the expert advice of a lawyer in drawing up a will regarding your possessions. Decisions about taxes, beneficiaries, and asset distribution will be legally binding. This process can relieve your family and friends of any burdens surrounding disputes or questions that may come up about the allocation of your assets. DO NOT RESUSCITATE (DNR) A do not resuscitate (DNR) order is a request to not have CPR in the event that your heart stops beating or you stop breathing. Unless given other instructions, a health care provider will try to help any patient whose heart has stopped or who has stopped breathing.  HEALTH CARE PROXY AND DURABLE POWER OF ATTORNEY FOR HEALTH CARE A health care proxy is a person (agent) appointed to make medical decisions for you if you cannot. Generally, people choose someone they know well and trust to represent their preferences when they can no longer do so. You should be sure to ask this person for agreement to act as your agent. An agent may have to exercise judgment in the event of a medical decision for which your wishes are not known. The durable power of attorney for health care is the legal document that names your health care proxy. Once written, it should be:  Signed.  Notarized.  Dated.  Copied.  Witnessed.  Incorporated into your  medical record. You may also want to appoint someone to manage your financial affairs if you cannot. This is called a durable power of attorney for finances. It is a separate legal document from the durable power  of attorney for health care. You may choose the same person or someone different from your health care proxy to act as your agent in financial matters.   This information is not intended to replace advice given to you by your health care provider. Make sure you discuss any questions you have with your health care provider.   Document Released: 07/14/2007 Document Revised: 04/11/2013 Document Reviewed: 08/24/2012 Elsevier Interactive Patient Education 2016 [place] Maintenance, Female Adopting a healthy lifestyle and getting preventive care can go a long way to promote health and wellness. Talk with your health care provider about what schedule of regular examinations is right for you. This is a good chance for you to check in with your provider about disease prevention and staying healthy. In between checkups, there are plenty of things you can do on your own. Experts have done a lot of research about which lifestyle changes and preventive measures are most likely to keep you healthy. Ask your health care provider for more information. WEIGHT AND DIET  Eat a healthy diet  Be sure to include plenty of vegetables, fruits, low-fat dairy products, and lean protein.  Do not eat a lot of foods high in solid fats, added sugars, or salt.  Get regular exercise. This is one of the most important things you can do for your health.  Most adults should exercise for at least 150 minutes each week. The exercise should increase your heart rate and make you sweat (moderate-intensity exercise).  Most adults should also do strengthening exercises at least twice a week. This is in addition to the moderate-intensity exercise.  Maintain a healthy weight  Body mass index (BMI) is a measurement that can be used to identify possible weight problems. It estimates body fat based on height and weight. Your health care provider can help determine your BMI and help you achieve or maintain a healthy  weight.  For females 90 years of age and older:   A BMI below 18.5 is considered underweight.  A BMI of 18.5 to 24.9 is normal.  A BMI of 25 to 29.9 is considered overweight.  A BMI of 30 and above is considered obese.  Watch levels of cholesterol and blood lipids  You should start having your blood tested for lipids and cholesterol at 79 years of age, then have this test every 5 years.  You may need to have your cholesterol levels checked more often if:  Your lipid or cholesterol levels are high.  You are older than 79 years of age.  You are at high risk for heart disease.  CANCER SCREENING   Lung Cancer  Lung cancer screening is recommended for adults 49-82 years old who are at high risk for lung cancer because of a history of smoking.  A yearly low-dose CT scan of the lungs is recommended for people who:  Currently smoke.  Have quit within the past 15 years.  Have at least a 30-pack-year history of smoking. A pack year is smoking an average of one pack of cigarettes a day for 1 year.  Yearly screening should continue until it has been 15 years since you quit.  Yearly screening should stop if you develop a health problem that would prevent you from having lung cancer  treatment.  Breast Cancer  Practice breast self-awareness. This means understanding how your breasts normally appear and feel.  It also means doing regular breast self-exams. Let your health care provider know about any changes, no matter how small.  If you are in your 20s or 30s, you should have a clinical breast exam (CBE) by a health care provider every 1-3 years as part of a regular health exam.  If you are 60 or older, have a CBE every year. Also consider having a breast X-ray (mammogram) every year.  If you have a family history of breast cancer, talk to your health care provider about genetic screening.  If you are at high risk for breast cancer, talk to your health care provider about  having an MRI and a mammogram every year.  Breast cancer gene (BRCA) assessment is recommended for women who have family members with BRCA-related cancers. BRCA-related cancers include:  Breast.  Ovarian.  Tubal.  Peritoneal cancers.  Results of the assessment will determine the need for genetic counseling and BRCA1 and BRCA2 testing. Cervical Cancer Your health care provider may recommend that you be screened regularly for cancer of the pelvic organs (ovaries, uterus, and vagina). This screening involves a pelvic examination, including checking for microscopic changes to the surface of your cervix (Pap test). You may be encouraged to have this screening done every 3 years, beginning at age 65.  For women ages 45-65, health care providers may recommend pelvic exams and Pap testing every 3 years, or they may recommend the Pap and pelvic exam, combined with testing for human papilloma virus (HPV), every 5 years. Some types of HPV increase your risk of cervical cancer. Testing for HPV may also be done on women of any age with unclear Pap test results.  Other health care providers may not recommend any screening for nonpregnant women who are considered low risk for pelvic cancer and who do not have symptoms. Ask your health care provider if a screening pelvic exam is right for you.  If you have had past treatment for cervical cancer or a condition that could lead to cancer, you need Pap tests and screening for cancer for at least 20 years after your treatment. If Pap tests have been discontinued, your risk factors (such as having a new sexual partner) need to be reassessed to determine if screening should resume. Some women have medical problems that increase the chance of getting cervical cancer. In these cases, your health care provider may recommend more frequent screening and Pap tests. Colorectal Cancer  This type of cancer can be detected and often prevented.  Routine colorectal cancer  screening usually begins at 79 years of age and continues through 79 years of age.  Your health care provider may recommend screening at an earlier age if you have risk factors for colon cancer.  Your health care provider may also recommend using home test kits to check for hidden blood in the stool.  A small camera at the end of a tube can be used to examine your colon directly (sigmoidoscopy or colonoscopy). This is done to check for the earliest forms of colorectal cancer.  Routine screening usually begins at age 8.  Direct examination of the colon should be repeated every 5-10 years through 79 years of age. However, you may need to be screened more often if early forms of precancerous polyps or small growths are found. Skin Cancer  Check your skin from head to toe regularly.  Tell your  health care provider about any new moles or changes in moles, especially if there is a change in a mole's shape or color.  Also tell your health care provider if you have a mole that is larger than the size of a pencil eraser.  Always use sunscreen. Apply sunscreen liberally and repeatedly throughout the day.  Protect yourself by wearing long sleeves, pants, a wide-brimmed hat, and sunglasses whenever you are outside. HEART DISEASE, DIABETES, AND HIGH BLOOD PRESSURE   High blood pressure causes heart disease and increases the risk of stroke. High blood pressure is more likely to develop in:  People who have blood pressure in the high end of the normal range (130-139/85-89 mm Hg).  People who are overweight or obese.  People who are African American.  If you are 49-23 years of age, have your blood pressure checked every 3-5 years. If you are 66 years of age or older, have your blood pressure checked every year. You should have your blood pressure measured twice--once when you are at a hospital or clinic, and once when you are not at a hospital or clinic. Record the average of the two measurements.  To check your blood pressure when you are not at a hospital or clinic, you can use:  An automated blood pressure machine at a pharmacy.  A home blood pressure monitor.  If you are between 24 years and 70 years old, ask your health care provider if you should take aspirin to prevent strokes.  Have regular diabetes screenings. This involves taking a blood sample to check your fasting blood sugar level.  If you are at a normal weight and have a low risk for diabetes, have this test once every three years after 79 years of age.  If you are overweight and have a high risk for diabetes, consider being tested at a younger age or more often. PREVENTING INFECTION  Hepatitis B  If you have a higher risk for hepatitis B, you should be screened for this virus. You are considered at high risk for hepatitis B if:  You were born in a country where hepatitis B is common. Ask your health care provider which countries are considered high risk.  Your parents were born in a high-risk country, and you have not been immunized against hepatitis B (hepatitis B vaccine).  You have HIV or AIDS.  You use needles to inject street drugs.  You live with someone who has hepatitis B.  You have had sex with someone who has hepatitis B.  You get hemodialysis treatment.  You take certain medicines for conditions, including cancer, organ transplantation, and autoimmune conditions. Hepatitis C  Blood testing is recommended for:  Everyone born from [birth year] through 1963.  Anyone with known risk factors for hepatitis C. Sexually transmitted infections (STIs)  You should be screened for sexually transmitted infections (STIs) including gonorrhea and chlamydia if:  You are sexually active and are younger than 79 years of age.  You are older than 79 years of age and your health care provider tells you that you are at risk for this type of infection.  Your sexual activity has changed since you were last screened and  you are at an increased risk for chlamydia or gonorrhea. Ask your health care provider if you are at risk.  If you do not have HIV, but are at risk, it may be recommended that you take a prescription medicine daily to prevent HIV infection. This is called pre-exposure prophylaxis (PrEP).  You are considered at risk if:  You are sexually active and do not regularly use condoms or know the HIV status of your partner(s).  You take drugs by injection.  You are sexually active with a partner who has HIV. Talk with your health care provider about whether you are at high risk of being infected with HIV. If you choose to begin PrEP, you should first be tested for HIV. You should then be tested every 3 months for as long as you are taking PrEP.  PREGNANCY   If you are premenopausal and you may become pregnant, ask your health care provider about preconception counseling.  If you may become pregnant, take 400 to 800 micrograms (mcg) of folic acid every day.  If you want to prevent pregnancy, talk to your health care provider about birth control (contraception). OSTEOPOROSIS AND MENOPAUSE   Osteoporosis is a disease in which the bones lose minerals and strength with aging. This can result in serious bone fractures. Your risk for osteoporosis can be identified using a bone density scan.  If you are 58 years of age or older, or if you are at risk for osteoporosis and fractures, ask your health care provider if you should be screened.  Ask your health care provider whether you should take a calcium or vitamin D supplement to lower your risk for osteoporosis.  Menopause may have certain physical symptoms and risks.  Hormone replacement therapy may reduce some of these symptoms and risks. Talk to your health care provider about whether hormone replacement therapy is right for you.  HOME CARE INSTRUCTIONS   Schedule regular health, dental, and eye exams.  Stay current with your immunizations.   Do  not use any tobacco products including cigarettes, chewing tobacco, or electronic cigarettes.  If you are pregnant, do not drink alcohol.  If you are breastfeeding, limit how much and how often you drink alcohol.  Limit alcohol intake to no more than 1 drink per day for nonpregnant women. One drink equals 12 ounces of beer, 5 ounces of wine, or 1 ounces of hard liquor.  Do not use street drugs.  Do not share needles.  Ask your health care provider for help if you need support or information about quitting drugs.  Tell your health care provider if you often feel depressed.  Tell your health care provider if you have ever been abused or do not feel safe at home.   This information is not intended to replace advice given to you by your health care provider. Make sure you discuss any questions you have with your health care provider.   Document Released: 10/20/2010 Document Revised: 04/27/2014 Document Reviewed: 03/08/2013 Elsevier Interactive Patient Education [employer] Menopause is a normal process in which your reproductive ability comes to an end. This process happens gradually over a span of months to years, usually between the ages of 44 and 29. Menopause is complete when you have missed 12 consecutive menstrual periods. It is important to talk with your health care provider about some of the most common conditions that affect postmenopausal women, such as heart disease, cancer, and bone loss (osteoporosis). Adopting a healthy lifestyle and getting preventive care can help to promote your health and wellness. Those actions can also lower your chances of developing some of these common conditions. WHAT SHOULD I KNOW ABOUT MENOPAUSE? During menopause, you may experience a number of symptoms, such as:  Moderate-to-severe hot flashes.  Night sweats.  Decrease in sex drive.  Mood swings.  Headaches.  Tiredness.  Irritability.  Memory problems.  Insomnia. Choosing  to treat or not to treat menopausal changes is an individual decision that you make with your health care provider. WHAT SHOULD I KNOW ABOUT HORMONE REPLACEMENT THERAPY AND SUPPLEMENTS? Hormone therapy products are effective for treating symptoms that are associated with menopause, such as hot flashes and night sweats. Hormone replacement carries certain risks, especially as you become older. If you are thinking about using estrogen or estrogen with progestin treatments, discuss the benefits and risks with your health care provider. WHAT SHOULD I KNOW ABOUT HEART DISEASE AND STROKE? Heart disease, heart attack, and stroke become more likely as you age. This may be due, in part, to the hormonal changes that your body experiences during menopause. These can affect how your body processes dietary fats, triglycerides, and cholesterol. Heart attack and stroke are both medical emergencies. There are many things that you can do to help prevent heart disease and stroke:  Have your blood pressure checked at least every 1-2 years. High blood pressure causes heart disease and increases the risk of stroke.  If you are 77-86 years old, ask your health care provider if you should take aspirin to prevent a heart attack or a stroke.  Do not use any tobacco products, including cigarettes, chewing tobacco, or electronic cigarettes. If you need help quitting, ask your health care provider.  It is important to eat a healthy diet and maintain a healthy weight.  Be sure to include plenty of vegetables, fruits, low-fat dairy products, and lean protein.  Avoid eating foods that are high in solid fats, added sugars, or salt (sodium).  Get regular exercise. This is one of the most important things that you can do for your health.  Try to exercise for at least 150 minutes each week. The type of exercise that you do should increase your heart rate and make you sweat. This is known as moderate-intensity exercise.  Try to  do strengthening exercises at least twice each week. Do these in addition to the moderate-intensity exercise.  Know your numbers.Ask your health care provider to check your cholesterol and your blood glucose. Continue to have your blood tested as directed by your health care provider. WHAT SHOULD I KNOW ABOUT CANCER SCREENING? There are several types of cancer. Take the following steps to reduce your risk and to catch any cancer development as early as possible. Breast Cancer  Practice breast self-awareness.  This means understanding how your breasts normally appear and feel.  It also means doing regular breast self-exams. Let your health care provider know about any changes, no matter how small.  If you are 81 or older, have a clinician do a breast exam (clinical breast exam or CBE) every year. Depending on your age, family history, and medical history, it may be recommended that you also have a yearly breast X-ray (mammogram).  If you have a family history of breast cancer, talk with your health care provider about genetic screening.  If you are at high risk for breast cancer, talk with your health care provider about having an MRI and a mammogram every year.  Breast cancer (BRCA) gene test is recommended for women who have family members with BRCA-related cancers. Results of the assessment will determine the need for genetic counseling and BRCA1 and for BRCA2 testing. BRCA-related cancers include these types:  Breast. This occurs in males or females.  Ovarian.  Tubal. This may also be called fallopian tube  cancer.  Cancer of the abdominal or pelvic lining (peritoneal cancer).  Prostate.  Pancreatic. Cervical, Uterine, and Ovarian Cancer Your health care provider may recommend that you be screened regularly for cancer of the pelvic organs. These include your ovaries, uterus, and vagina. This screening involves a pelvic exam, which includes checking for microscopic changes to the  surface of your cervix (Pap test).  For women ages 21-65, health care providers may recommend a pelvic exam and a Pap test every three years. For women ages 40-65, they may recommend the Pap test and pelvic exam, combined with testing for human papilloma virus (HPV), every five years. Some types of HPV increase your risk of cervical cancer. Testing for HPV may also be done on women of any age who have unclear Pap test results.  Other health care providers may not recommend any screening for nonpregnant women who are considered low risk for pelvic cancer and have no symptoms. Ask your health care provider if a screening pelvic exam is right for you.  If you have had past treatment for cervical cancer or a condition that could lead to cancer, you need Pap tests and screening for cancer for at least 20 years after your treatment. If Pap tests have been discontinued for you, your risk factors (such as having a new sexual partner) need to be reassessed to determine if you should start having screenings again. Some women have medical problems that increase the chance of getting cervical cancer. In these cases, your health care provider may recommend that you have screening and Pap tests more often.  If you have a family history of uterine cancer or ovarian cancer, talk with your health care provider about genetic screening.  If you have vaginal bleeding after reaching menopause, tell your health care provider.  There are currently no reliable tests available to screen for ovarian cancer. Lung Cancer Lung cancer screening is recommended for adults 53-1 years old who are at high risk for lung cancer because of a history of smoking. A yearly low-dose CT scan of the lungs is recommended if you:  Currently smoke.  Have a history of at least 30 pack-years of smoking and you currently smoke or have quit within the past 15 years. A pack-year is smoking an average of one pack of cigarettes per day for one  year. Yearly screening should:  Continue until it has been 15 years since you quit.  Stop if you develop a health problem that would prevent you from having lung cancer treatment. Colorectal Cancer  This type of cancer can be detected and can often be prevented.  Routine colorectal cancer screening usually begins at age 73 and continues through age 61.  If you have risk factors for colon cancer, your health care provider may recommend that you be screened at an earlier age.  If you have a family history of colorectal cancer, talk with your health care provider about genetic screening.  Your health care provider may also recommend using home test kits to check for hidden blood in your stool.  A small camera at the end of a tube can be used to examine your colon directly (sigmoidoscopy or colonoscopy). This is done to check for the earliest forms of colorectal cancer.  Direct examination of the colon should be repeated every 5-10 years until age 30. However, if early forms of precancerous polyps or small growths are found or if you have a family history or genetic risk for colorectal cancer, you may  need to be screened more often. Skin Cancer  Check your skin from head to toe regularly.  Monitor any moles. Be sure to tell your health care provider:  About any new moles or changes in moles, especially if there is a change in a mole's shape or color.  If you have a mole that is larger than the size of a pencil eraser.  If any of your family members has a history of skin cancer, especially at a young age, talk with your health care provider about genetic screening.  Always use sunscreen. Apply sunscreen liberally and repeatedly throughout the day.  Whenever you are outside, protect yourself by wearing long sleeves, pants, a wide-brimmed hat, and sunglasses. WHAT SHOULD I KNOW ABOUT OSTEOPOROSIS? Osteoporosis is a condition in which bone destruction happens more quickly than new bone  creation. After menopause, you may be at an increased risk for osteoporosis. To help prevent osteoporosis or the bone fractures that can happen because of osteoporosis, the following is recommended:  If you are 49-83 years old, get at least 1,000 mg of calcium and at least 600 mg of vitamin D per day.  If you are older than age 82 but younger than age 59, get at least 1,200 mg of calcium and at least 600 mg of vitamin D per day.  If you are older than age 71, get at least 1,200 mg of calcium and at least 800 mg of vitamin D per day. Smoking and excessive alcohol intake increase the risk of osteoporosis. Eat foods that are rich in calcium and vitamin D, and do weight-bearing exercises several times each week as directed by your health care provider. WHAT SHOULD I KNOW ABOUT HOW MENOPAUSE AFFECTS [place]? Depression may occur at any age, but it is more common as you become older. Common symptoms of depression include:  Low or sad mood.  Changes in sleep patterns.  Changes in appetite or eating patterns.  Feeling an overall lack of motivation or enjoyment of activities that you previously enjoyed.  Frequent crying spells. Talk with your health care provider if you think that you are experiencing depression. WHAT SHOULD I KNOW ABOUT IMMUNIZATIONS? It is important that you get and maintain your immunizations. These include:  Tetanus, diphtheria, and pertussis (Tdap) booster vaccine.  Influenza every year before the flu season begins.  Pneumonia vaccine.  Shingles vaccine. Your health care provider may also recommend other immunizations.   This information is not intended to replace advice given to you by your health care provider. Make sure you discuss any questions you have with your health care provider.   Document Released: 05/29/2005 Document Revised: 04/27/2014 Document Reviewed: 12/07/2013 Elsevier Interactive Patient Education [employer].

## 2015-11-11 NOTE — Assessment & Plan Note (Signed)
Stable on current regimen. Continue current regimen. Continue to monitor.  

## 2015-11-11 NOTE — Assessment & Plan Note (Signed)
+  Leuks and RBCs. Will await culture and treat as needed.

## 2015-11-12 ENCOUNTER — Telehealth: Payer: Self-pay | Admitting: Family Medicine

## 2015-11-12 DIAGNOSIS — E559 Vitamin D deficiency, unspecified: Secondary | ICD-10-CM | POA: Insufficient documentation

## 2015-11-12 DIAGNOSIS — E039 Hypothyroidism, unspecified: Secondary | ICD-10-CM

## 2015-11-12 DIAGNOSIS — D510 Vitamin B12 deficiency anemia due to intrinsic factor deficiency: Secondary | ICD-10-CM | POA: Diagnosis not present

## 2015-11-12 DIAGNOSIS — I951 Orthostatic hypotension: Secondary | ICD-10-CM | POA: Diagnosis not present

## 2015-11-12 DIAGNOSIS — I4891 Unspecified atrial fibrillation: Secondary | ICD-10-CM | POA: Diagnosis not present

## 2015-11-12 DIAGNOSIS — E119 Type 2 diabetes mellitus without complications: Secondary | ICD-10-CM | POA: Diagnosis not present

## 2015-11-12 LAB — COMPREHENSIVE METABOLIC PANEL
ALK PHOS: 67 IU/L (ref 39–117)
ALT: 5 IU/L (ref 0–32)
AST: 16 IU/L (ref 0–40)
Albumin/Globulin Ratio: 0.8 — ABNORMAL LOW (ref 1.2–2.2)
Albumin: 3.8 g/dL (ref 3.5–4.8)
BILIRUBIN TOTAL: 0.4 mg/dL (ref 0.0–1.2)
BUN/Creatinine Ratio: 36 — ABNORMAL HIGH (ref 12–28)
BUN: 21 mg/dL (ref 8–27)
CHLORIDE: 100 mmol/L (ref 96–106)
CO2: 23 mmol/L (ref 18–29)
CREATININE: 0.59 mg/dL (ref 0.57–1.00)
Calcium: 8.9 mg/dL (ref 8.7–10.3)
GFR calc Af Amer: 101 mL/min/{1.73_m2} (ref >59)
GFR calc non Af Amer: 87 mL/min/{1.73_m2} (ref >59)
GLUCOSE: 102 mg/dL — AB (ref 65–99)
Globulin, Total: 4.5 g/dL (ref 1.5–4.5)
Potassium: 4.1 mmol/L (ref 3.5–5.2)
SODIUM: 139 mmol/L (ref 134–144)
Total Protein: 8.3 g/dL (ref 6.0–8.5)

## 2015-11-12 LAB — CBC WITH DIFFERENTIAL/PLATELET
BASOS ABS: 0 10*3/uL (ref 0.0–0.2)
Basos: 0 %
EOS (ABSOLUTE): 0.1 10*3/uL (ref 0.0–0.4)
Eos: 2 %
Hematocrit: 34.6 % (ref 34.0–46.6)
Hemoglobin: 11.2 g/dL (ref 11.1–15.9)
Immature Grans (Abs): 0 10*3/uL (ref 0.0–0.1)
Immature Granulocytes: 0 %
LYMPHS ABS: 0.9 10*3/uL (ref 0.7–3.1)
Lymphs: 19 %
MCH: 29.6 pg (ref 26.6–33.0)
MCHC: 32.4 g/dL (ref 31.5–35.7)
MCV: 92 fL (ref 79–97)
MONOCYTES: 7 %
MONOS ABS: 0.3 10*3/uL (ref 0.1–0.9)
Neutrophils Absolute: 3.5 10*3/uL (ref 1.4–7.0)
Neutrophils: 72 %
PLATELETS: 222 10*3/uL (ref 150–379)
RBC: 3.78 x10E6/uL (ref 3.77–5.28)
RDW: 15.3 % (ref 12.3–15.4)
WBC: 4.8 10*3/uL (ref 3.4–10.8)

## 2015-11-12 LAB — B12 AND FOLATE PANEL
FOLATE: 8.5 ng/mL (ref >3.0)
VITAMIN B 12: 805 pg/mL (ref 211–946)

## 2015-11-12 LAB — LIPID PANEL W/O CHOL/HDL RATIO
CHOLESTEROL TOTAL: 132 mg/dL (ref 100–199)
HDL: 46 mg/dL (ref >39)
LDL CALC: 64 mg/dL (ref 0–99)
TRIGLYCERIDES: 112 mg/dL (ref 0–149)
VLDL CHOLESTEROL CAL: 22 mg/dL (ref 5–40)

## 2015-11-12 LAB — TSH: TSH: 6.95 u[IU]/mL — ABNORMAL HIGH (ref 0.450–4.500)

## 2015-11-12 LAB — VITAMIN D 25 HYDROXY (VIT D DEFICIENCY, FRACTURES): Vit D, 25-Hydroxy: 6 ng/mL — ABNORMAL LOW (ref 30.0–100.0)

## 2015-11-12 MED ORDER — VITAMIN D (ERGOCALCIFEROL) 1.25 MG (50000 UNIT) PO CAPS: 50000.0000 [IU] | ORAL_CAPSULE | ORAL | 0 refills | Status: DC

## 2015-11-12 MED ORDER — LEVOTHYROXINE SODIUM 88 MCG PO TABS: 88.0000 ug | ORAL_TABLET | Freq: Every day | ORAL | 0 refills | Status: DC

## 2015-11-12 NOTE — Telephone Encounter (Signed)
Please let her know that her labs came out mostly good, but her vitamin D is really low! I'm going to send through a Rx for her to take 1x a week, and we'll recheck it next visit. Also her thyroid is a little underactive, so I sent through a higher dose and we'll check it again next time. Otherwise everything looks great! Thanks!

## 2015-11-12 NOTE — Telephone Encounter (Signed)
Patient notified

## 2015-11-14 DIAGNOSIS — D510 Vitamin B12 deficiency anemia due to intrinsic factor deficiency: Secondary | ICD-10-CM | POA: Diagnosis not present

## 2015-11-14 DIAGNOSIS — I951 Orthostatic hypotension: Secondary | ICD-10-CM | POA: Diagnosis not present

## 2015-11-14 DIAGNOSIS — E119 Type 2 diabetes mellitus without complications: Secondary | ICD-10-CM | POA: Diagnosis not present

## 2015-11-14 DIAGNOSIS — I4891 Unspecified atrial fibrillation: Secondary | ICD-10-CM | POA: Diagnosis not present

## 2015-11-15 ENCOUNTER — Telehealth: Payer: Self-pay | Admitting: Family Medicine

## 2015-11-15 DIAGNOSIS — E785 Hyperlipidemia, unspecified: Secondary | ICD-10-CM | POA: Diagnosis not present

## 2015-11-15 DIAGNOSIS — I951 Orthostatic hypotension: Secondary | ICD-10-CM | POA: Diagnosis not present

## 2015-11-15 DIAGNOSIS — D510 Vitamin B12 deficiency anemia due to intrinsic factor deficiency: Secondary | ICD-10-CM | POA: Diagnosis not present

## 2015-11-15 DIAGNOSIS — E119 Type 2 diabetes mellitus without complications: Secondary | ICD-10-CM | POA: Diagnosis not present

## 2015-11-15 DIAGNOSIS — M7989 Other specified soft tissue disorders: Secondary | ICD-10-CM | POA: Diagnosis not present

## 2015-11-15 DIAGNOSIS — I6523 Occlusion and stenosis of bilateral carotid arteries: Secondary | ICD-10-CM | POA: Diagnosis not present

## 2015-11-15 DIAGNOSIS — I4891 Unspecified atrial fibrillation: Secondary | ICD-10-CM | POA: Diagnosis not present

## 2015-11-15 LAB — UA/M W/RFLX CULTURE, ROUTINE
Bilirubin, UA: NEGATIVE
Glucose, UA: NEGATIVE
Ketones, UA: NEGATIVE
NITRITE UA: POSITIVE — AB
PH UA: 5.5 (ref 5.0–7.5)
Protein, UA: NEGATIVE
Specific Gravity, UA: 1.02 (ref 1.005–1.030)
UUROB: 1 mg/dL (ref 0.2–1.0)

## 2015-11-15 LAB — MICROSCOPIC EXAMINATION

## 2015-11-15 LAB — URINE CULTURE, REFLEX

## 2015-11-15 LAB — BAYER DCA HB A1C WAIVED: HB A1C (BAYER DCA - WAIVED): 6.6 % (ref <7.0)

## 2015-11-15 LAB — MICROALBUMIN, URINE WAIVED
CREATININE, URINE WAIVED: 50 mg/dL (ref 10–300)
Microalb, Ur Waived: 30 mg/L — ABNORMAL HIGH (ref 0–19)

## 2015-11-15 MED ORDER — SULFAMETHOXAZOLE-TRIMETHOPRIM 800-160 MG PO TABS: 1.0000 | ORAL_TABLET | Freq: Two times a day (BID) | ORAL | 0 refills | Status: DC

## 2015-11-15 NOTE — Telephone Encounter (Signed)
Patient notified

## 2015-11-15 NOTE — Telephone Encounter (Signed)
Please let patient know that her urine cx did show a UTI, and I have sent in an antibiotic to [facility] for her. Thanks

## 2015-11-18 DIAGNOSIS — D510 Vitamin B12 deficiency anemia due to intrinsic factor deficiency: Secondary | ICD-10-CM | POA: Diagnosis not present

## 2015-11-18 DIAGNOSIS — E119 Type 2 diabetes mellitus without complications: Secondary | ICD-10-CM | POA: Diagnosis not present

## 2015-11-18 DIAGNOSIS — I4891 Unspecified atrial fibrillation: Secondary | ICD-10-CM | POA: Diagnosis not present

## 2015-11-18 DIAGNOSIS — I951 Orthostatic hypotension: Secondary | ICD-10-CM | POA: Diagnosis not present

## 2015-11-18 DIAGNOSIS — B351 Tinea unguium: Secondary | ICD-10-CM | POA: Diagnosis not present

## 2015-11-19 ENCOUNTER — Telehealth: Payer: Self-pay

## 2015-11-19 NOTE — Telephone Encounter (Signed)
Patient would like to know if she needs to come in for a repeat urine when she finishes her antibiotic for her UTI.

## 2015-11-19 NOTE — Telephone Encounter (Signed)
Patient notified

## 2015-11-19 NOTE — Telephone Encounter (Signed)
She can do that

## 2015-11-20 DIAGNOSIS — I951 Orthostatic hypotension: Secondary | ICD-10-CM | POA: Diagnosis not present

## 2015-11-20 DIAGNOSIS — E119 Type 2 diabetes mellitus without complications: Secondary | ICD-10-CM | POA: Diagnosis not present

## 2015-11-20 DIAGNOSIS — D510 Vitamin B12 deficiency anemia due to intrinsic factor deficiency: Secondary | ICD-10-CM | POA: Diagnosis not present

## 2015-11-20 DIAGNOSIS — I4891 Unspecified atrial fibrillation: Secondary | ICD-10-CM | POA: Diagnosis not present

## 2015-11-21 DIAGNOSIS — E119 Type 2 diabetes mellitus without complications: Secondary | ICD-10-CM | POA: Diagnosis not present

## 2015-11-21 DIAGNOSIS — D510 Vitamin B12 deficiency anemia due to intrinsic factor deficiency: Secondary | ICD-10-CM | POA: Diagnosis not present

## 2015-11-21 DIAGNOSIS — I951 Orthostatic hypotension: Secondary | ICD-10-CM | POA: Diagnosis not present

## 2015-11-21 DIAGNOSIS — I4891 Unspecified atrial fibrillation: Secondary | ICD-10-CM | POA: Diagnosis not present

## 2015-11-25 DIAGNOSIS — I4891 Unspecified atrial fibrillation: Secondary | ICD-10-CM | POA: Diagnosis not present

## 2015-11-25 DIAGNOSIS — D510 Vitamin B12 deficiency anemia due to intrinsic factor deficiency: Secondary | ICD-10-CM | POA: Diagnosis not present

## 2015-11-25 DIAGNOSIS — E119 Type 2 diabetes mellitus without complications: Secondary | ICD-10-CM | POA: Diagnosis not present

## 2015-11-25 DIAGNOSIS — I951 Orthostatic hypotension: Secondary | ICD-10-CM | POA: Diagnosis not present

## 2015-11-27 DIAGNOSIS — D510 Vitamin B12 deficiency anemia due to intrinsic factor deficiency: Secondary | ICD-10-CM | POA: Diagnosis not present

## 2015-11-27 DIAGNOSIS — E119 Type 2 diabetes mellitus without complications: Secondary | ICD-10-CM | POA: Diagnosis not present

## 2015-11-27 DIAGNOSIS — I951 Orthostatic hypotension: Secondary | ICD-10-CM | POA: Diagnosis not present

## 2015-11-27 DIAGNOSIS — I4891 Unspecified atrial fibrillation: Secondary | ICD-10-CM | POA: Diagnosis not present

## 2015-11-28 ENCOUNTER — Other Ambulatory Visit: Payer: Medicare Other

## 2015-11-28 ENCOUNTER — Telehealth: Payer: Self-pay | Admitting: Family Medicine

## 2015-11-28 DIAGNOSIS — N390 Urinary tract infection, site not specified: Secondary | ICD-10-CM

## 2015-11-28 LAB — UA/M W/RFLX CULTURE, ROUTINE
Bilirubin, UA: NEGATIVE
GLUCOSE, UA: NEGATIVE
Ketones, UA: NEGATIVE
LEUKOCYTES UA: NEGATIVE
NITRITE UA: NEGATIVE
Protein, UA: NEGATIVE
Specific Gravity, UA: 1.02 (ref 1.005–1.030)
Urobilinogen, Ur: 0.2 mg/dL (ref 0.2–1.0)
pH, UA: 5.5 (ref 5.0–7.5)

## 2015-11-28 LAB — MICROSCOPIC EXAMINATION: WBC, UA: NONE SEEN /hpf (ref 0–?)

## 2015-11-28 NOTE — Telephone Encounter (Signed)
Pt called stated she is returning a call. Please call pt ASAP. Thanks.

## 2015-11-29 DIAGNOSIS — D510 Vitamin B12 deficiency anemia due to intrinsic factor deficiency: Secondary | ICD-10-CM | POA: Diagnosis not present

## 2015-11-29 DIAGNOSIS — I4891 Unspecified atrial fibrillation: Secondary | ICD-10-CM | POA: Diagnosis not present

## 2015-11-29 DIAGNOSIS — I951 Orthostatic hypotension: Secondary | ICD-10-CM | POA: Diagnosis not present

## 2015-11-29 DIAGNOSIS — E119 Type 2 diabetes mellitus without complications: Secondary | ICD-10-CM | POA: Diagnosis not present

## 2015-11-29 NOTE — Telephone Encounter (Signed)
Spoke with patient.

## 2015-11-29 NOTE — Telephone Encounter (Signed)
Please call pt and let her know her repeat U/A looked normal and there is no need for further treatment. Thanks!

## 2015-11-29 NOTE — Telephone Encounter (Signed)
Patient did bring repeat urine sample in yesterday, results are in chart.  Can you look and see if you want to give her another ABX.  She uses [facility] on [address]

## 2015-12-02 DIAGNOSIS — E119 Type 2 diabetes mellitus without complications: Secondary | ICD-10-CM | POA: Diagnosis not present

## 2015-12-02 DIAGNOSIS — I951 Orthostatic hypotension: Secondary | ICD-10-CM | POA: Diagnosis not present

## 2015-12-02 DIAGNOSIS — I4891 Unspecified atrial fibrillation: Secondary | ICD-10-CM | POA: Diagnosis not present

## 2015-12-02 DIAGNOSIS — D510 Vitamin B12 deficiency anemia due to intrinsic factor deficiency: Secondary | ICD-10-CM | POA: Diagnosis not present

## 2015-12-04 ENCOUNTER — Telehealth: Payer: Self-pay | Admitting: Family Medicine

## 2015-12-04 DIAGNOSIS — D510 Vitamin B12 deficiency anemia due to intrinsic factor deficiency: Secondary | ICD-10-CM | POA: Diagnosis not present

## 2015-12-04 DIAGNOSIS — E119 Type 2 diabetes mellitus without complications: Secondary | ICD-10-CM | POA: Diagnosis not present

## 2015-12-04 DIAGNOSIS — I4891 Unspecified atrial fibrillation: Secondary | ICD-10-CM | POA: Diagnosis not present

## 2015-12-04 DIAGNOSIS — I951 Orthostatic hypotension: Secondary | ICD-10-CM | POA: Diagnosis not present

## 2015-12-04 MED ORDER — BENZONATATE 100 MG PO CAPS: 100.0000 mg | ORAL_CAPSULE | Freq: Three times a day (TID) | ORAL | 2 refills | Status: DC | PRN

## 2015-12-04 NOTE — Telephone Encounter (Signed)
rx sent

## 2015-12-04 NOTE — Telephone Encounter (Signed)
Pt has a bad cough and would like to get benzonatate sent to [facility] [address] so she doesn't end up with pneumonia.

## 2015-12-05 ENCOUNTER — Ambulatory Visit: Payer: Medicare Other | Admitting: Family Medicine

## 2015-12-05 NOTE — Telephone Encounter (Signed)
Disregard

## 2015-12-07 DIAGNOSIS — E119 Type 2 diabetes mellitus without complications: Secondary | ICD-10-CM | POA: Diagnosis not present

## 2015-12-07 DIAGNOSIS — D510 Vitamin B12 deficiency anemia due to intrinsic factor deficiency: Secondary | ICD-10-CM | POA: Diagnosis not present

## 2015-12-07 DIAGNOSIS — I951 Orthostatic hypotension: Secondary | ICD-10-CM | POA: Diagnosis not present

## 2015-12-07 DIAGNOSIS — I4891 Unspecified atrial fibrillation: Secondary | ICD-10-CM | POA: Diagnosis not present

## 2015-12-09 ENCOUNTER — Ambulatory Visit (INDEPENDENT_AMBULATORY_CARE_PROVIDER_SITE_OTHER): Payer: Medicare Other | Admitting: Family Medicine

## 2015-12-09 ENCOUNTER — Telehealth: Payer: Self-pay | Admitting: Family Medicine

## 2015-12-09 ENCOUNTER — Encounter: Payer: Self-pay | Admitting: Family Medicine

## 2015-12-09 VITALS — BP 127/70 | HR 106 | Temp 98.0°F | Wt 193.0 lb

## 2015-12-09 DIAGNOSIS — E119 Type 2 diabetes mellitus without complications: Secondary | ICD-10-CM | POA: Diagnosis not present

## 2015-12-09 DIAGNOSIS — D510 Vitamin B12 deficiency anemia due to intrinsic factor deficiency: Secondary | ICD-10-CM | POA: Diagnosis not present

## 2015-12-09 DIAGNOSIS — I4891 Unspecified atrial fibrillation: Secondary | ICD-10-CM | POA: Diagnosis not present

## 2015-12-09 DIAGNOSIS — J019 Acute sinusitis, unspecified: Secondary | ICD-10-CM | POA: Diagnosis not present

## 2015-12-09 DIAGNOSIS — I951 Orthostatic hypotension: Secondary | ICD-10-CM | POA: Diagnosis not present

## 2015-12-09 MED ORDER — AMOXICILLIN-POT CLAVULANATE 875-125 MG PO TABS: 1.0000 | ORAL_TABLET | Freq: Two times a day (BID) | ORAL | 0 refills | Status: DC

## 2015-12-09 NOTE — Telephone Encounter (Signed)
Pt called requested that Dr. Jeananne Rama call her ASAP. Thanks.

## 2015-12-09 NOTE — Progress Notes (Signed)
BP 127/70 (BP Location: Right Arm, Patient Position: Sitting, Cuff Size: Normal)   Pulse (!) 106   Temp 98 F (36.7 C)   Wt 193 lb (87.5 kg) Comment: with shoes  SpO2 96%   BMI 35.07 kg/m    Subjective:    Patient ID: Mia Padilla, female    DOB: 1934-08-20, 81 y.o.   MRN: [id number]  HPI: Mia Padilla is a 81 y.o. female  Chief Complaint  Patient presents with  . URI  Patient with a week of head congestion drainage feeling bad cough aching as to a baby in other people with head cold over a week ago. Patient with some low-grade fevers and marked generalized symptoms and fatigue. No PND but coughing legs are not swelling any more than usual Tried over-the-counter medications but still just getting worse.  Relevant past medical, surgical, family and social history reviewed and updated as indicated. Interim medical history since our last visit reviewed. Allergies and medications reviewed and updated.  Review of Systems  Constitutional: Positive for chills, diaphoresis, fatigue and fever.  HENT: Positive for congestion, postnasal drip, rhinorrhea, sinus pressure, sneezing and sore throat.   Respiratory: Positive for cough. Negative for shortness of breath and wheezing.   Cardiovascular: Negative.   Gastrointestinal: Negative.     Per HPI unless specifically indicated above     Objective:    BP 127/70 (BP Location: Right Arm, Patient Position: Sitting, Cuff Size: Normal)   Pulse (!) 106   Temp 98 F (36.7 C)   Wt 193 lb (87.5 kg) Comment: with shoes  SpO2 96%   BMI 35.07 kg/m   Wt Readings from Last 3 Encounters:  12/09/15 193 lb (87.5 kg)  11/11/15 196 lb (88.9 kg)  08/21/15 196 lb (88.9 kg)    Physical Exam  Constitutional: She is oriented to person, place, and time. She appears well-developed and well-nourished. No distress.  HENT:  Head: Normocephalic and atraumatic.  Right Ear: Hearing normal.  Left Ear: Hearing normal.  Nose: Nose normal.  Mouth/Throat:  Oropharyngeal exudate present.  Eyes: Conjunctivae and lids are normal. Right eye exhibits no discharge. Left eye exhibits no discharge. No scleral icterus.  Cardiovascular: Regular rhythm.   Pulmonary/Chest: Effort normal. No respiratory distress.  Rhonchi and wheeze  Musculoskeletal: Normal range of motion.  Neurological: She is alert and oriented to person, place, and time.  Skin: Skin is intact. No rash noted.  Psychiatric: She has a normal mood and affect. Her speech is normal and behavior is normal. Judgment and thought content normal. Cognition and memory are normal.    Results for orders placed or performed in visit on 11/28/15  Microscopic Examination  Result Value Ref Range   WBC, UA None seen 0 - 5 /hpf   RBC, UA 3-10 (A) 0 - 2 /hpf   Epithelial Cells (non renal) 0-10 0 - 10 /hpf   Bacteria, UA Few None seen/Few  UA/M w/rflx Culture, Routine  Result Value Ref Range   Specific Gravity, UA 1.020 1.005 - 1.030   pH, UA 5.5 5.0 - 7.5   Color, UA Yellow Yellow   Appearance Ur Clear Clear   Leukocytes, UA Negative Negative   Protein, UA Negative Negative/Trace   Glucose, UA Negative Negative   Ketones, UA Negative Negative   RBC, UA Trace (A) Negative   Bilirubin, UA Negative Negative   Urobilinogen, Ur 0.2 0.2 - 1.0 mg/dL   Nitrite, UA Negative Negative   Microscopic Examination See  below:       Assessment & Plan:   Problem List Items Addressed This Visit    None    Visit Diagnoses    Acute sinusitis, recurrence not specified, unspecified location    -  Primary   Relevant Medications   amoxicillin-clavulanate (AUGMENTIN) 875-125 MG tablet      Discussed sinusitis bronchitis treatment and care will use Augmentin patient education on use of medications use Mucinex if getting worse will need to further follow-up with concern about developing pneumonia. Patient will continue other medications can add Tylenol  Follow up plan: Return if symptoms worsen or fail to  improve, for As scheduled.

## 2015-12-11 DIAGNOSIS — I951 Orthostatic hypotension: Secondary | ICD-10-CM | POA: Diagnosis not present

## 2015-12-11 DIAGNOSIS — E119 Type 2 diabetes mellitus without complications: Secondary | ICD-10-CM | POA: Diagnosis not present

## 2015-12-11 DIAGNOSIS — I4891 Unspecified atrial fibrillation: Secondary | ICD-10-CM | POA: Diagnosis not present

## 2015-12-11 DIAGNOSIS — D510 Vitamin B12 deficiency anemia due to intrinsic factor deficiency: Secondary | ICD-10-CM | POA: Diagnosis not present

## 2015-12-13 DIAGNOSIS — E119 Type 2 diabetes mellitus without complications: Secondary | ICD-10-CM | POA: Diagnosis not present

## 2015-12-13 DIAGNOSIS — D510 Vitamin B12 deficiency anemia due to intrinsic factor deficiency: Secondary | ICD-10-CM | POA: Diagnosis not present

## 2015-12-13 DIAGNOSIS — I4891 Unspecified atrial fibrillation: Secondary | ICD-10-CM | POA: Diagnosis not present

## 2015-12-13 DIAGNOSIS — I951 Orthostatic hypotension: Secondary | ICD-10-CM | POA: Diagnosis not present

## 2015-12-16 DIAGNOSIS — E119 Type 2 diabetes mellitus without complications: Secondary | ICD-10-CM | POA: Diagnosis not present

## 2015-12-16 DIAGNOSIS — I4891 Unspecified atrial fibrillation: Secondary | ICD-10-CM | POA: Diagnosis not present

## 2015-12-16 DIAGNOSIS — I951 Orthostatic hypotension: Secondary | ICD-10-CM | POA: Diagnosis not present

## 2015-12-16 DIAGNOSIS — D510 Vitamin B12 deficiency anemia due to intrinsic factor deficiency: Secondary | ICD-10-CM | POA: Diagnosis not present

## 2015-12-18 DIAGNOSIS — E119 Type 2 diabetes mellitus without complications: Secondary | ICD-10-CM | POA: Diagnosis not present

## 2015-12-18 DIAGNOSIS — I951 Orthostatic hypotension: Secondary | ICD-10-CM | POA: Diagnosis not present

## 2015-12-18 DIAGNOSIS — D510 Vitamin B12 deficiency anemia due to intrinsic factor deficiency: Secondary | ICD-10-CM | POA: Diagnosis not present

## 2015-12-18 DIAGNOSIS — I4891 Unspecified atrial fibrillation: Secondary | ICD-10-CM | POA: Diagnosis not present

## 2015-12-19 DIAGNOSIS — D510 Vitamin B12 deficiency anemia due to intrinsic factor deficiency: Secondary | ICD-10-CM | POA: Diagnosis not present

## 2015-12-19 DIAGNOSIS — I4891 Unspecified atrial fibrillation: Secondary | ICD-10-CM | POA: Diagnosis not present

## 2015-12-19 DIAGNOSIS — H2513 Age-related nuclear cataract, bilateral: Secondary | ICD-10-CM | POA: Diagnosis not present

## 2015-12-19 DIAGNOSIS — H401133 Primary open-angle glaucoma, bilateral, severe stage: Secondary | ICD-10-CM | POA: Diagnosis not present

## 2015-12-19 DIAGNOSIS — E119 Type 2 diabetes mellitus without complications: Secondary | ICD-10-CM | POA: Diagnosis not present

## 2015-12-19 DIAGNOSIS — I951 Orthostatic hypotension: Secondary | ICD-10-CM | POA: Diagnosis not present

## 2015-12-24 DIAGNOSIS — E119 Type 2 diabetes mellitus without complications: Secondary | ICD-10-CM | POA: Diagnosis not present

## 2015-12-24 DIAGNOSIS — I4891 Unspecified atrial fibrillation: Secondary | ICD-10-CM | POA: Diagnosis not present

## 2015-12-24 DIAGNOSIS — I951 Orthostatic hypotension: Secondary | ICD-10-CM | POA: Diagnosis not present

## 2015-12-24 DIAGNOSIS — D510 Vitamin B12 deficiency anemia due to intrinsic factor deficiency: Secondary | ICD-10-CM | POA: Diagnosis not present

## 2015-12-25 DIAGNOSIS — E119 Type 2 diabetes mellitus without complications: Secondary | ICD-10-CM | POA: Diagnosis not present

## 2015-12-25 DIAGNOSIS — I4891 Unspecified atrial fibrillation: Secondary | ICD-10-CM | POA: Diagnosis not present

## 2015-12-25 DIAGNOSIS — D510 Vitamin B12 deficiency anemia due to intrinsic factor deficiency: Secondary | ICD-10-CM | POA: Diagnosis not present

## 2015-12-25 DIAGNOSIS — I951 Orthostatic hypotension: Secondary | ICD-10-CM | POA: Diagnosis not present

## 2015-12-27 DIAGNOSIS — I951 Orthostatic hypotension: Secondary | ICD-10-CM | POA: Diagnosis not present

## 2015-12-27 DIAGNOSIS — E119 Type 2 diabetes mellitus without complications: Secondary | ICD-10-CM | POA: Diagnosis not present

## 2015-12-27 DIAGNOSIS — I4891 Unspecified atrial fibrillation: Secondary | ICD-10-CM | POA: Diagnosis not present

## 2015-12-27 DIAGNOSIS — D510 Vitamin B12 deficiency anemia due to intrinsic factor deficiency: Secondary | ICD-10-CM | POA: Diagnosis not present

## 2015-12-30 DIAGNOSIS — I951 Orthostatic hypotension: Secondary | ICD-10-CM | POA: Diagnosis not present

## 2015-12-30 DIAGNOSIS — D510 Vitamin B12 deficiency anemia due to intrinsic factor deficiency: Secondary | ICD-10-CM | POA: Diagnosis not present

## 2015-12-30 DIAGNOSIS — E119 Type 2 diabetes mellitus without complications: Secondary | ICD-10-CM | POA: Diagnosis not present

## 2015-12-30 DIAGNOSIS — I4891 Unspecified atrial fibrillation: Secondary | ICD-10-CM | POA: Diagnosis not present

## 2016-01-01 DIAGNOSIS — I951 Orthostatic hypotension: Secondary | ICD-10-CM | POA: Diagnosis not present

## 2016-01-01 DIAGNOSIS — E119 Type 2 diabetes mellitus without complications: Secondary | ICD-10-CM | POA: Diagnosis not present

## 2016-01-01 DIAGNOSIS — D510 Vitamin B12 deficiency anemia due to intrinsic factor deficiency: Secondary | ICD-10-CM | POA: Diagnosis not present

## 2016-01-01 DIAGNOSIS — I4891 Unspecified atrial fibrillation: Secondary | ICD-10-CM | POA: Diagnosis not present

## 2016-01-03 ENCOUNTER — Telehealth: Payer: Self-pay | Admitting: Family Medicine

## 2016-01-03 DIAGNOSIS — E119 Type 2 diabetes mellitus without complications: Secondary | ICD-10-CM | POA: Diagnosis not present

## 2016-01-03 DIAGNOSIS — D510 Vitamin B12 deficiency anemia due to intrinsic factor deficiency: Secondary | ICD-10-CM | POA: Diagnosis not present

## 2016-01-03 DIAGNOSIS — I951 Orthostatic hypotension: Secondary | ICD-10-CM | POA: Diagnosis not present

## 2016-01-03 DIAGNOSIS — I4891 Unspecified atrial fibrillation: Secondary | ICD-10-CM | POA: Diagnosis not present

## 2016-01-03 NOTE — Telephone Encounter (Signed)
Lattie Haw from [facility] called and would like a verbal order for B12 injections every 2 weeks, and a home health aid every 3 times a week. Please call Lattie Haw a call back ASAP. There was a lot of static on the line please confirm the above listed request is correct. Thanks.

## 2016-01-06 ENCOUNTER — Ambulatory Visit (INDEPENDENT_AMBULATORY_CARE_PROVIDER_SITE_OTHER): Payer: Medicare Other | Admitting: Cardiovascular Disease

## 2016-01-06 ENCOUNTER — Encounter: Payer: Self-pay | Admitting: Cardiovascular Disease

## 2016-01-06 VITALS — BP 130/68 | HR 107 | Ht 62.0 in | Wt 198.5 lb

## 2016-01-06 DIAGNOSIS — E1151 Type 2 diabetes mellitus with diabetic peripheral angiopathy without gangrene: Secondary | ICD-10-CM

## 2016-01-06 DIAGNOSIS — I5033 Acute on chronic diastolic (congestive) heart failure: Secondary | ICD-10-CM | POA: Diagnosis not present

## 2016-01-06 DIAGNOSIS — E119 Type 2 diabetes mellitus without complications: Secondary | ICD-10-CM | POA: Diagnosis not present

## 2016-01-06 DIAGNOSIS — I4891 Unspecified atrial fibrillation: Secondary | ICD-10-CM

## 2016-01-06 DIAGNOSIS — I10 Essential (primary) hypertension: Secondary | ICD-10-CM | POA: Diagnosis not present

## 2016-01-06 DIAGNOSIS — E669 Obesity, unspecified: Secondary | ICD-10-CM

## 2016-01-06 DIAGNOSIS — J432 Centrilobular emphysema: Secondary | ICD-10-CM

## 2016-01-06 DIAGNOSIS — I951 Orthostatic hypotension: Secondary | ICD-10-CM | POA: Diagnosis not present

## 2016-01-06 DIAGNOSIS — D510 Vitamin B12 deficiency anemia due to intrinsic factor deficiency: Secondary | ICD-10-CM | POA: Diagnosis not present

## 2016-01-06 NOTE — Telephone Encounter (Signed)
Yes OK

## 2016-01-06 NOTE — Progress Notes (Signed)
Cardiology Office Note  Date:  01/06/2016   ID:  Mia Padilla, DOB October 04, 1934, MRN [id number]  PCP:  Golden Pop, MD   Chief Complaint  Patient presents with  . other    12 month follow up. Meds reviewed by the patient verbally. "doing well."     HPI:  Mia Padilla is a 81 year old woman with history of stroke, seizure disorder, hypertension, COPD, diabetes, hypothyroidism, obesity, atrial fibrillation on hospital admission April 04 2011 for mental status changes. During her hospital course, she had an echocardiogram showing normal LV function, carotid ultrasound showing no significant disease, head CT and MRI showing no significant stroke. She reports having a "code" called on her in the hospital.  Notes indicate possible TIA. She was started on warfarin for atrial fibrillation , Stopped since that time by primary care secondary to unsteady gait and risk of falls She presents today for follow up of her atrial fibrillation.  She reports smoking for 20 years, on inhalers  In follow-up today, she reports having episodes of bilateral chest pain ascribed as a cramping feeling. Symptoms often brief, sometimes last longer Tender in the pectoral region bilaterally She has not pushed on that area to see if it hurts with palpation  Heart rate elevated today, she did not take her metoprolol as morning Denies any significant shortness of breath. No leg edema  Lab work reviewed Total chol 132, LDL 64 A1C 6.6  Occasional vertigo for which she takes meclizine when necessary Lives with her son and his wife.  Takes Lasix once per week, no potassium  EKG on today's visit shows sinus tachycardia with rate 107 bpm, no significant ST or T-wave changes  Other past medical history Significant gait instability, uses a walker  significant osteoarthritis of her knees. H/o  mastectomy 08/03/2013 for breast cancer by Dr. Jamal Collin. No radiation treatment or chemotherapy needed, she is on Arimidex.    Coumadin was stopped in the past secondary to unsteady gait by Dr. Jeananne Rama. She was started on Plavix 10/13/2011. She denies any recent falls.  Periods of tachycardia in the past, better on metoprolol Notes indicate that she has had a history of seizures, possibly with low glucose levels. Previous diagnosis of complex partial seizures and she was started on antiseizure medications. Weaned off Keppra  PMH:   has a past medical history of Arthritis; Black head; Bowel incontinence; Breast cancer of upper-outer quadrant of left female breast ([place]) (2015); Carpal tunnel syndrome; Debilitated; Glaucoma; History of seizures; Hyperlipidemia; Hypertension; Hypotension; Obesity; and Urinary incontinence.  PSH:    Past Surgical History:  Procedure Laterality Date  . BOWEL RESECTION  2005  . BREAST SURGERY  08/03/13   left mastectomy  . CARPAL TUNNEL RELEASE Right 30 years   . MASTECTOMY     left   . PARTIAL HYSTERECTOMY    . TONSILLECTOMY    . UMBILICAL HERNIA REPAIR      Current Outpatient Prescriptions  Medication Sig Dispense Refill  . acetaminophen (TYLENOL) 500 MG tablet Take 500 mg by mouth every 6 (six) hours as needed.    Marland Kitchen albuterol (PROVENTIL HFA;VENTOLIN HFA) 108 (90 Base) MCG/ACT inhaler Inhale 2 puffs into the lungs every 6 (six) hours as needed. 18 g 5  . alendronate (FOSAMAX) 70 MG tablet Take 1 tablet (70 mg total) by mouth once a week. Take with a full glass of water on an empty stomach. 12 tablet 4  . anastrozole (ARIMIDEX) 1 MG tablet Take 1 tablet (1 mg  total) by mouth daily. 30 tablet 12  . aspirin EC 81 MG tablet Take 81 mg by mouth daily.    . benzonatate (TESSALON PERLES) 100 MG capsule Take 1 capsule (100 mg total) by mouth 3 (three) times daily as needed for cough. 20 capsule 2  . blood glucose meter kit and supplies Diagnosis:E11.9 Test glucose twice daily 1 each 12  . brimonidine (ALPHAGAN P) 0.1 % SOLN 2 (two) times daily.     . canagliflozin (INVOKANA) 100 MG  TABS tablet Take 1 tablet (100 mg total) by mouth daily. 90 tablet 4  . clopidogrel (PLAVIX) 75 MG tablet Take 1 tablet (75 mg total) by mouth daily. 90 tablet 4  . cyanocobalamin (,VITAMIN B-12,) 1000 MCG/ML injection Inject 1 mL (1,000 mcg total) into the muscle every 30 (thirty) days. 1 mL 12  . digoxin (LANOXIN) 0.125 MG tablet Take 1 tablet (125 mcg total) by mouth daily. 90 tablet 1  . dorzolamide (TRUSOPT) 2 % ophthalmic solution 1 drop 2 (two) times daily.     . fluticasone (FLONASE) 50 MCG/ACT nasal spray Place 2 sprays into both nostrils daily. 16 g 12  . Fluticasone-Salmeterol (ADVAIR) 250-50 MCG/DOSE AEPB Inhale 1 puff into the lungs every 12 (twelve) hours. 180 each 4  . glucose blood test strip Use as instructed 100 each 12  . LEVEMIR FLEXTOUCH 100 UNIT/ML Pen INJECT 23 TO 30 UNITS PER DAY 15 mL 11  . levothyroxine (SYNTHROID, LEVOTHROID) 88 MCG tablet Take 1 tablet (88 mcg total) by mouth daily. 90 tablet 0  . lisinopril (PRINIVIL,ZESTRIL) 10 MG tablet Take 1 tablet (10 mg total) by mouth daily. 90 tablet 4  . meclizine (ANTIVERT) 25 MG tablet Take 1 tablet (25 mg total) by mouth 3 (three) times daily as needed. 90 tablet 1  . metFORMIN (GLUCOPHAGE) 500 MG tablet Take 1 tablet (500 mg total) by mouth 2 (two) times daily with a meal. 180 tablet 4  . metoprolol succinate (TOPROL-XL) 100 MG 24 hr tablet Take 1 tablet (100 mg total) by mouth daily. 90 tablet 4  . mometasone (ELOCON) 0.1 % cream Apply 1 application topically daily. Do not use day in and day out 45 g 2  . mupirocin ointment (BACTROBAN) 2 % Apply 1 application topically 2 (two) times daily. Do not use day in and day out 22 g 2  . nystatin (MYCOSTATIN/NYSTOP) powder Apply topically 4 (four) times daily. 15 g 0  . Olopatadine HCl (PATADAY) 0.2 % SOLN Apply to eye daily.    . solifenacin (VESICARE) 5 MG tablet Take 2 tablets (10 mg total) by mouth daily. 180 tablet 4  . sulfamethoxazole-trimethoprim (BACTRIM DS,SEPTRA DS)  800-160 MG tablet Take 1 tablet by mouth 2 (two) times daily. 6 tablet 0  . travoprost, benzalkonium, (TRAVATAN) 0.004 % ophthalmic solution Apply 1 drop to eye at bedtime.    . Vitamin D, Ergocalciferol, (DRISDOL) 50000 units CAPS capsule Take 1 capsule (50,000 Units total) by mouth every 7 (seven) days. 12 capsule 0   No current facility-administered medications for this visit.      Allergies:   Propoxyphene; Darvocet [propoxyphene n-acetaminophen]; and Percocet [oxycodone-acetaminophen]   Social History:  The patient  reports that she has quit smoking. She has never used smokeless tobacco. She reports that she does not drink alcohol or use drugs.   Family History:   family history includes Cervical cancer in her daughter and mother; Diabetes in her brother and mother; Glaucoma in her mother; HIV  in her brother; Hypertension in her brother, daughter, father, sister, and son.    Review of Systems: Review of Systems  Constitutional: Negative.   Respiratory: Negative.   Cardiovascular: Negative.   Gastrointestinal: Negative.   Musculoskeletal: Negative.   Neurological: Negative.   Psychiatric/Behavioral: Negative.   All other systems reviewed and are negative.   PHYSICAL EXAM: VS:  BP 130/68 (BP Location: Right Arm, Patient Position: Sitting, Cuff Size: Normal)   Pulse (!) 107   Ht _0  (1.575 m)   Wt 198 lb 8 oz (90 kg)   BMI 36.31 kg/m  , BMI Body mass index is 36.31 kg/m. GEN: Well nourished, well developed, in no acute distress  HEENT: normal  Neck: no JVD, carotid bruits, or masses Cardiac: RRR; no murmurs, rubs, or gallops,no edema  Respiratory:  clear to auscultation bilaterally, normal work of breathing GI: soft, nontender, nondistended, + BS MS: no deformity or atrophy  Skin: warm and dry, no rash Neuro:  Strength and sensation are intact Psych: euthymic mood, full affect    Recent Labs: 06/09/2015: Hemoglobin 11.8 11/11/2015: ALT 5; BUN 21; Creatinine, Ser  0.59; Platelets 222; Potassium 4.1; Sodium 139; TSH 6.950    Lipid Panel Lab Results  Component Value Date   CHOL 132 11/11/2015   HDL 46 11/11/2015   LDLCALC 64 11/11/2015   TRIG 112 11/11/2015      Wt Readings from Last 3 Encounters:  01/06/16 198 lb 8 oz (90 kg)  12/09/15 193 lb (87.5 kg)  11/11/15 196 lb (88.9 kg)       ASSESSMENT AND PLAN:  Atrial fibrillation, unspecified type ([place]) - Plan: EKG 12-Lead Maintaining normal sinus rhythm. Encouraged her to stay on her metoprolol digoxin, Not on anticoagulation  Acute on chronic diastolic congestive heart failure ([place]) - Plan: EKG 12-Lead Appears relatively euvolemic. Takes Lasix as needed for leg swelling  Essential hypertension Blood pressure is well controlled on today's visit. No changes made to the medications.  Centrilobular emphysema (HCC) Currently on inhalers, reports her breathing is stable 20 years of smoking  Obese We have encouraged continued exercise, careful diet management in an effort to lose weight.  Type 2 diabetes mellitus with diabetic peripheral angiopathy without gangrene, without long-term current use of insulin (HCC) Encouraged low carbohydrate diet   Total encounter time more than 25 minutes  Greater than 50% was spent in counseling and coordination of care with the patient   Disposition:   F/U  6 months   Orders Placed This Encounter  Procedures  . EKG 12-Lead     Signed, Esmond Plants, M.D., Ph.D. 01/06/2016  [place], [place]

## 2016-01-06 NOTE — Telephone Encounter (Signed)
B12 injections every 2 weeks is okay.

## 2016-01-06 NOTE — Patient Instructions (Addendum)
Medication Instructions:   No medication changes made  Don't forget the metoprolol, This controls your heart rate and atrial fibrillation  Labwork:  No new labs needed  Testing/Procedures:  No further testing at this time   Follow-Up: It was a pleasure seeing you in the office today. Please call us if you have new issues that need to be addressed before your next appt.  [phone]  Your physician wants you to follow-up in: 6 months.  You will receive a reminder letter in the mail two months in advance. If you don't receive a letter, please call our office to schedule the follow-up appointment.  If you need a refill on your cardiac medications before your next appointment, please call your pharmacy.

## 2016-01-06 NOTE — Telephone Encounter (Signed)
Called Lisa and LVM  For verbal orders

## 2016-01-08 ENCOUNTER — Telehealth: Payer: Self-pay

## 2016-01-08 MED ORDER — AMOXICILLIN 875 MG PO TABS: 875.0000 mg | ORAL_TABLET | Freq: Two times a day (BID) | ORAL | 0 refills | Status: DC

## 2016-01-08 MED ORDER — BENZONATATE 100 MG PO CAPS: 100.0000 mg | ORAL_CAPSULE | Freq: Three times a day (TID) | ORAL | 2 refills | Status: DC | PRN

## 2016-01-08 NOTE — Telephone Encounter (Signed)
Patient wants to speak with MAC in regards to having a cold and B12 tablets

## 2016-01-08 NOTE — Telephone Encounter (Signed)
Phone call Patient with development of usual sinus infection and responds well to Amoxil and Tessalon Perles patient unable to come in will call in prescription.

## 2016-01-09 DIAGNOSIS — I951 Orthostatic hypotension: Secondary | ICD-10-CM | POA: Diagnosis not present

## 2016-01-09 DIAGNOSIS — I4891 Unspecified atrial fibrillation: Secondary | ICD-10-CM | POA: Diagnosis not present

## 2016-01-09 DIAGNOSIS — E119 Type 2 diabetes mellitus without complications: Secondary | ICD-10-CM | POA: Diagnosis not present

## 2016-01-09 DIAGNOSIS — D510 Vitamin B12 deficiency anemia due to intrinsic factor deficiency: Secondary | ICD-10-CM | POA: Diagnosis not present

## 2016-01-17 ENCOUNTER — Telehealth: Payer: Self-pay | Admitting: Family Medicine

## 2016-01-17 DIAGNOSIS — I951 Orthostatic hypotension: Secondary | ICD-10-CM | POA: Diagnosis not present

## 2016-01-17 DIAGNOSIS — D510 Vitamin B12 deficiency anemia due to intrinsic factor deficiency: Secondary | ICD-10-CM | POA: Diagnosis not present

## 2016-01-17 DIAGNOSIS — E119 Type 2 diabetes mellitus without complications: Secondary | ICD-10-CM | POA: Diagnosis not present

## 2016-01-17 DIAGNOSIS — I4891 Unspecified atrial fibrillation: Secondary | ICD-10-CM | POA: Diagnosis not present

## 2016-01-17 NOTE — Telephone Encounter (Signed)
Ms Lattie Haw called and stated that the pt had cold symptoms and she wanted [place] to be aware. When offered an appt she declined and stated she would call back if the pt didn't get any better.

## 2016-01-20 DIAGNOSIS — I4891 Unspecified atrial fibrillation: Secondary | ICD-10-CM | POA: Diagnosis not present

## 2016-01-20 DIAGNOSIS — E119 Type 2 diabetes mellitus without complications: Secondary | ICD-10-CM | POA: Diagnosis not present

## 2016-01-20 DIAGNOSIS — I951 Orthostatic hypotension: Secondary | ICD-10-CM | POA: Diagnosis not present

## 2016-01-20 DIAGNOSIS — D510 Vitamin B12 deficiency anemia due to intrinsic factor deficiency: Secondary | ICD-10-CM | POA: Diagnosis not present

## 2016-01-22 DIAGNOSIS — E119 Type 2 diabetes mellitus without complications: Secondary | ICD-10-CM | POA: Diagnosis not present

## 2016-01-22 DIAGNOSIS — I951 Orthostatic hypotension: Secondary | ICD-10-CM | POA: Diagnosis not present

## 2016-01-22 DIAGNOSIS — I4891 Unspecified atrial fibrillation: Secondary | ICD-10-CM | POA: Diagnosis not present

## 2016-01-22 DIAGNOSIS — D510 Vitamin B12 deficiency anemia due to intrinsic factor deficiency: Secondary | ICD-10-CM | POA: Diagnosis not present

## 2016-01-24 DIAGNOSIS — I4891 Unspecified atrial fibrillation: Secondary | ICD-10-CM | POA: Diagnosis not present

## 2016-01-24 DIAGNOSIS — I951 Orthostatic hypotension: Secondary | ICD-10-CM | POA: Diagnosis not present

## 2016-01-24 DIAGNOSIS — E119 Type 2 diabetes mellitus without complications: Secondary | ICD-10-CM | POA: Diagnosis not present

## 2016-01-24 DIAGNOSIS — D510 Vitamin B12 deficiency anemia due to intrinsic factor deficiency: Secondary | ICD-10-CM | POA: Diagnosis not present

## 2016-01-27 DIAGNOSIS — I4891 Unspecified atrial fibrillation: Secondary | ICD-10-CM | POA: Diagnosis not present

## 2016-01-27 DIAGNOSIS — I951 Orthostatic hypotension: Secondary | ICD-10-CM | POA: Diagnosis not present

## 2016-01-27 DIAGNOSIS — D510 Vitamin B12 deficiency anemia due to intrinsic factor deficiency: Secondary | ICD-10-CM | POA: Diagnosis not present

## 2016-01-27 DIAGNOSIS — E119 Type 2 diabetes mellitus without complications: Secondary | ICD-10-CM | POA: Diagnosis not present

## 2016-01-28 DIAGNOSIS — I4891 Unspecified atrial fibrillation: Secondary | ICD-10-CM | POA: Diagnosis not present

## 2016-01-28 DIAGNOSIS — E119 Type 2 diabetes mellitus without complications: Secondary | ICD-10-CM | POA: Diagnosis not present

## 2016-01-28 DIAGNOSIS — I951 Orthostatic hypotension: Secondary | ICD-10-CM | POA: Diagnosis not present

## 2016-01-28 DIAGNOSIS — D510 Vitamin B12 deficiency anemia due to intrinsic factor deficiency: Secondary | ICD-10-CM | POA: Diagnosis not present

## 2016-01-31 DIAGNOSIS — I4891 Unspecified atrial fibrillation: Secondary | ICD-10-CM | POA: Diagnosis not present

## 2016-01-31 DIAGNOSIS — I951 Orthostatic hypotension: Secondary | ICD-10-CM | POA: Diagnosis not present

## 2016-01-31 DIAGNOSIS — D510 Vitamin B12 deficiency anemia due to intrinsic factor deficiency: Secondary | ICD-10-CM | POA: Diagnosis not present

## 2016-01-31 DIAGNOSIS — E119 Type 2 diabetes mellitus without complications: Secondary | ICD-10-CM | POA: Diagnosis not present

## 2016-02-04 DIAGNOSIS — I4891 Unspecified atrial fibrillation: Secondary | ICD-10-CM | POA: Diagnosis not present

## 2016-02-04 DIAGNOSIS — I951 Orthostatic hypotension: Secondary | ICD-10-CM | POA: Diagnosis not present

## 2016-02-04 DIAGNOSIS — D510 Vitamin B12 deficiency anemia due to intrinsic factor deficiency: Secondary | ICD-10-CM | POA: Diagnosis not present

## 2016-02-04 DIAGNOSIS — E119 Type 2 diabetes mellitus without complications: Secondary | ICD-10-CM | POA: Diagnosis not present

## 2016-02-05 DIAGNOSIS — D510 Vitamin B12 deficiency anemia due to intrinsic factor deficiency: Secondary | ICD-10-CM | POA: Diagnosis not present

## 2016-02-05 DIAGNOSIS — I951 Orthostatic hypotension: Secondary | ICD-10-CM | POA: Diagnosis not present

## 2016-02-05 DIAGNOSIS — E119 Type 2 diabetes mellitus without complications: Secondary | ICD-10-CM | POA: Diagnosis not present

## 2016-02-05 DIAGNOSIS — I4891 Unspecified atrial fibrillation: Secondary | ICD-10-CM | POA: Diagnosis not present

## 2016-02-07 DIAGNOSIS — I951 Orthostatic hypotension: Secondary | ICD-10-CM | POA: Diagnosis not present

## 2016-02-07 DIAGNOSIS — D510 Vitamin B12 deficiency anemia due to intrinsic factor deficiency: Secondary | ICD-10-CM | POA: Diagnosis not present

## 2016-02-07 DIAGNOSIS — I4891 Unspecified atrial fibrillation: Secondary | ICD-10-CM | POA: Diagnosis not present

## 2016-02-07 DIAGNOSIS — E119 Type 2 diabetes mellitus without complications: Secondary | ICD-10-CM | POA: Diagnosis not present

## 2016-02-10 DIAGNOSIS — I4891 Unspecified atrial fibrillation: Secondary | ICD-10-CM | POA: Diagnosis not present

## 2016-02-10 DIAGNOSIS — I951 Orthostatic hypotension: Secondary | ICD-10-CM | POA: Diagnosis not present

## 2016-02-10 DIAGNOSIS — E119 Type 2 diabetes mellitus without complications: Secondary | ICD-10-CM | POA: Diagnosis not present

## 2016-02-10 DIAGNOSIS — D510 Vitamin B12 deficiency anemia due to intrinsic factor deficiency: Secondary | ICD-10-CM | POA: Diagnosis not present

## 2016-02-11 ENCOUNTER — Ambulatory Visit (INDEPENDENT_AMBULATORY_CARE_PROVIDER_SITE_OTHER): Payer: Medicare Other | Admitting: Family Medicine

## 2016-02-11 ENCOUNTER — Encounter: Payer: Self-pay | Admitting: Family Medicine

## 2016-02-11 VITALS — BP 135/78 | HR 92 | Temp 97.9°F | Wt 201.0 lb

## 2016-02-11 DIAGNOSIS — E559 Vitamin D deficiency, unspecified: Secondary | ICD-10-CM | POA: Diagnosis not present

## 2016-02-11 DIAGNOSIS — E1151 Type 2 diabetes mellitus with diabetic peripheral angiopathy without gangrene: Secondary | ICD-10-CM

## 2016-02-11 DIAGNOSIS — C50412 Malignant neoplasm of upper-outer quadrant of left female breast: Secondary | ICD-10-CM

## 2016-02-11 DIAGNOSIS — I5033 Acute on chronic diastolic (congestive) heart failure: Secondary | ICD-10-CM | POA: Diagnosis not present

## 2016-02-11 DIAGNOSIS — R569 Unspecified convulsions: Secondary | ICD-10-CM | POA: Diagnosis not present

## 2016-02-11 DIAGNOSIS — I10 Essential (primary) hypertension: Secondary | ICD-10-CM

## 2016-02-11 DIAGNOSIS — E039 Hypothyroidism, unspecified: Secondary | ICD-10-CM | POA: Diagnosis not present

## 2016-02-11 LAB — BAYER DCA HB A1C WAIVED: HB A1C: 6.4 % (ref <7.0)

## 2016-02-11 NOTE — Assessment & Plan Note (Signed)
Followed by cardiology 

## 2016-02-11 NOTE — Assessment & Plan Note (Signed)
The current medical regimen is effective;  continue present plan and medications.  

## 2016-02-11 NOTE — Assessment & Plan Note (Signed)
Taking meds without problems no hot flashes

## 2016-02-11 NOTE — Progress Notes (Signed)
BP 135/78   Pulse 92   Temp 97.9 F (36.6 C)   Wt 201 lb (91.2 kg)   SpO2 99%   BMI 36.76 kg/m    Subjective:    Patient ID: Mia Padilla, female    DOB: August 13, 1934, 81 y.o.   MRN: [id number]  HPI: Mia Padilla is a 81 y.o. female  Chief Complaint  Patient presents with  . Diabetes  . Follow-up    recheck TSH and Vit D from her CPE  . Immunizations    she wants to talk with you about getting the flu shot first  . Medication Refill    she needs a refill on the nystatin powder. Would like refills.  Patient all in all doing well no complaints taking medications faithfully without problems doing better now that she is in home getting 3 square meals a day and being cared for  Relevant past medical, surgical, family and social history reviewed and updated as indicated. Interim medical history since our last visit reviewed. Allergies and medications reviewed and updated.  Review of Systems  Constitutional: Negative.   Respiratory: Negative.   Cardiovascular: Negative.     Per HPI unless specifically indicated above     Objective:    BP 135/78   Pulse 92   Temp 97.9 F (36.6 C)   Wt 201 lb (91.2 kg)   SpO2 99%   BMI 36.76 kg/m   Wt Readings from Last 3 Encounters:  02/11/16 201 lb (91.2 kg)  01/06/16 198 lb 8 oz (90 kg)  12/09/15 193 lb (87.5 kg)    Physical Exam  Constitutional: She is oriented to person, place, and time. She appears well-developed and well-nourished. No distress.  HENT:  Head: Normocephalic and atraumatic.  Right Ear: Hearing normal.  Left Ear: Hearing normal.  Nose: Nose normal.  Eyes: Conjunctivae and lids are normal. Right eye exhibits no discharge. Left eye exhibits no discharge. No scleral icterus.  Cardiovascular: Normal rate, regular rhythm and normal heart sounds.   Pulmonary/Chest: Effort normal and breath sounds normal. No respiratory distress.  Musculoskeletal: Normal range of motion.  Trace edema of both feet  Neurological:  She is alert and oriented to person, place, and time.  Skin: Skin is intact. No rash noted.  Psychiatric: She has a normal mood and affect. Her speech is normal and behavior is normal. Judgment and thought content normal. Cognition and memory are normal.    Results for orders placed or performed in visit on 02/11/16  Bayer DCA Hb A1c Waived  Result Value Ref Range   Bayer DCA Hb A1c Waived 6.4 <7.0 %      Assessment & Plan:   Problem List Items Addressed This Visit      Cardiovascular and Mediastinum   Hypertension - Primary    The current medical regimen is effective;  continue present plan and medications.       Diastolic CHF ([place])    Followed by cardiology        Endocrine   Diabetes mellitus ([place])   Relevant Orders   Bayer DCA Hb A1c Waived (Completed)   Hypothyroidism     Other   Seizures (HCC)    No further seizures      Malignant neoplasm of upper-outer quadrant of female breast ([place])    Taking meds without problems no hot flashes      Vitamin D deficiency    Other Visit Diagnoses   None.  Follow up plan: Return in about 3 months (around 05/13/2016) for  Lipids, ALT, AST, BMP, Hemoglobin A1c.

## 2016-02-11 NOTE — Assessment & Plan Note (Signed)
No further seizures

## 2016-02-12 ENCOUNTER — Telehealth: Payer: Self-pay | Admitting: Family Medicine

## 2016-02-12 DIAGNOSIS — E039 Hypothyroidism, unspecified: Secondary | ICD-10-CM

## 2016-02-12 DIAGNOSIS — E119 Type 2 diabetes mellitus without complications: Secondary | ICD-10-CM | POA: Diagnosis not present

## 2016-02-12 DIAGNOSIS — I4891 Unspecified atrial fibrillation: Secondary | ICD-10-CM | POA: Diagnosis not present

## 2016-02-12 DIAGNOSIS — D510 Vitamin B12 deficiency anemia due to intrinsic factor deficiency: Secondary | ICD-10-CM | POA: Diagnosis not present

## 2016-02-12 DIAGNOSIS — I951 Orthostatic hypotension: Secondary | ICD-10-CM | POA: Diagnosis not present

## 2016-02-12 LAB — TSH: TSH: 7.08 u[IU]/mL — AB (ref 0.450–4.500)

## 2016-02-12 LAB — VITAMIN D 25 HYDROXY (VIT D DEFICIENCY, FRACTURES): VIT D 25 HYDROXY: 7.5 ng/mL — AB (ref 30.0–100.0)

## 2016-02-12 MED ORDER — LEVOTHYROXINE SODIUM 100 MCG PO TABS: 100.0000 ug | ORAL_TABLET | Freq: Every day | ORAL | 0 refills | Status: DC

## 2016-02-12 NOTE — Telephone Encounter (Signed)
Phone call Discussed with patient TSH going up patient taking thyroid appropriately each morning by itself with water waiting 30 minutes before other medicines or food. Will increase thyroid from 88 g to 100 g recheck TSH 6-8 weeks or so

## 2016-02-14 ENCOUNTER — Encounter (INDEPENDENT_AMBULATORY_CARE_PROVIDER_SITE_OTHER): Payer: Self-pay | Admitting: Vascular Surgery

## 2016-02-14 ENCOUNTER — Ambulatory Visit (INDEPENDENT_AMBULATORY_CARE_PROVIDER_SITE_OTHER): Payer: Medicare Other | Admitting: Vascular Surgery

## 2016-02-14 VITALS — BP 151/86 | HR 105 | Resp 17 | Ht 67.0 in | Wt 199.0 lb

## 2016-02-14 DIAGNOSIS — I951 Orthostatic hypotension: Secondary | ICD-10-CM | POA: Diagnosis not present

## 2016-02-14 DIAGNOSIS — I872 Venous insufficiency (chronic) (peripheral): Secondary | ICD-10-CM

## 2016-02-14 DIAGNOSIS — E1151 Type 2 diabetes mellitus with diabetic peripheral angiopathy without gangrene: Secondary | ICD-10-CM

## 2016-02-14 DIAGNOSIS — D510 Vitamin B12 deficiency anemia due to intrinsic factor deficiency: Secondary | ICD-10-CM | POA: Diagnosis not present

## 2016-02-14 DIAGNOSIS — I890 Lymphedema, not elsewhere classified: Secondary | ICD-10-CM | POA: Diagnosis not present

## 2016-02-14 DIAGNOSIS — E119 Type 2 diabetes mellitus without complications: Secondary | ICD-10-CM | POA: Diagnosis not present

## 2016-02-14 DIAGNOSIS — I4891 Unspecified atrial fibrillation: Secondary | ICD-10-CM | POA: Diagnosis not present

## 2016-02-14 NOTE — Progress Notes (Signed)
Subjective:    Patient ID: Mia Padilla, female    DOB: February 05, 1935, 81 y.o.   MRN: [id number] Chief Complaint  Patient presents with  . Re-evaluation    2 month Follow up   Patient last seen on 11/15/15. She is s/p right GSV ablation on 10/04/15. She presents today to discuss moving forward with left GSV ablation. She has been wearing compression and elevating her legs on a daily basis and states this has been controlling her symptoms. She is going to undergo cataract surgery in January of 2018 and would like to wait until after that surgery to address her left GSV. No ulcer formation bilaterally.    Review of Systems  Constitutional: Negative.   HENT: Negative.   Eyes: Negative.   Respiratory: Negative.   Cardiovascular: Positive for leg swelling.  Gastrointestinal: Negative.   Endocrine: Negative.   Genitourinary: Negative.   Musculoskeletal: Negative.   Skin: Negative.   Allergic/Immunologic: Negative.   Neurological: Negative.   Hematological: Negative.   Psychiatric/Behavioral: Negative.       Objective:   Physical Exam  Constitutional: She is oriented to person, place, and time. She appears well-developed and well-nourished.  HENT:  Head: Normocephalic and atraumatic.  Eyes: Conjunctivae and EOM are normal. Pupils are equal, round, and reactive to light.  Neck: Normal range of motion.  Cardiovascular: Normal rate, regular rhythm, normal heart sounds and intact distal pulses.   Pulses:      Radial pulses are 2+ on the right side, and 2+ on the left side.       Dorsalis pedis pulses are 1+ on the right side, and 1+ on the left side.       Posterior tibial pulses are 1+ on the right side, and 1+ on the left side.  Pulmonary/Chest: Effort normal and breath sounds normal.  Abdominal: Soft. Bowel sounds are normal.  Musculoskeletal: Normal range of motion. She exhibits edema (Mild Bilaterally. Mild stasis dermatitis noted. ).  Neurological: She is alert and oriented to  person, place, and time.  Skin: Skin is warm and dry.  Psychiatric: She has a normal mood and affect. Her behavior is normal. Judgment and thought content normal.   BP (!) 151/86 (BP Location: Left Arm)   Pulse (!) 105   Resp 17   Ht '5\' 7"'$  (1.702 m)   Wt 199 lb (90.3 kg)   BMI 31.17 kg/m   Past Medical History:  Diagnosis Date  . Arthritis    hands  . Black head   . Bowel incontinence   . Breast cancer of upper-outer quadrant of left female breast ([address]) 2015  . Carpal tunnel syndrome   . Debilitated   . Glaucoma   . History of seizures   . Hyperlipidemia   . Hypertension   . Hypotension   . Obesity   . Urinary incontinence    Social History   Social History  . Marital status: Divorced    Spouse name: N/A  . Number of children: N/A  . Years of education: N/A   Occupational History  . Not on file.   Social History Main Topics  . Smoking status: Former Research scientist (life sciences)  . Smokeless tobacco: Never Used  . Alcohol use No  . Drug use: No  . Sexual activity: Not on file   Other Topics Concern  . Not on file   Social History Narrative  . No narrative on file   Past Surgical History:  Procedure Laterality Date  .  BOWEL RESECTION  2005  . BREAST SURGERY  08/03/13   left mastectomy  . CARPAL TUNNEL RELEASE Right 30 years   . MASTECTOMY     left   . PARTIAL HYSTERECTOMY    . TONSILLECTOMY    . UMBILICAL HERNIA REPAIR     Family History  Problem Relation Age of Onset  . Cervical cancer Mother   . Diabetes Mother   . Glaucoma Mother   . Cervical cancer Daughter   . Hypertension Daughter   . Hypertension Father   . Hypertension Sister   . Hypertension Brother   . Hypertension Son   . Diabetes Brother   . HIV Brother    Allergies  Allergen Reactions  . Propoxyphene Other (See Comments)    Other Reaction: Severe Headache  . Advil [Ibuprofen] Other (See Comments)    Makes her heart race  . Darvocet [Propoxyphene N-Acetaminophen]   . Percocet  [Oxycodone-Acetaminophen]     hallucination      Assessment & Plan:  Patient last seen on 11/15/15. She is s/p right GSV ablation on 10/04/15. She presents today to discuss moving forward with left GSV ablation. She has been wearing compression and elevating her legs on a daily basis and states this has been controlling her symptoms. She is going to undergo cataract surgery in January of 2018 and would like to wait until after that surgery to address her left GSV. No ulcer formation bilaterally.   1. Chronic venous insufficiency - Stable Symptoms stable. Continue wearing compression and engaging in elevation at least heart level. Will bring back in Feb to discuss possibly moving forward with laser ablation of the left GSV.   2. Lymphedema - Stable Symptoms seem to be controlled with right GSV ablation, compression and elevation. Possible lymphedema pump in future if symptoms worsen.   3. Type 2 diabetes mellitus with diabetic peripheral angiopathy without gangrene, without long-term current use of insulin (HCC) - Stable Encouraged good control as its slows the progression of atherosclerotic disease.  Current Outpatient Prescriptions on File Prior to Visit  Medication Sig Dispense Refill  . acetaminophen (TYLENOL) 500 MG tablet Take 500 mg by mouth every 6 (six) hours as needed.    Marland Kitchen albuterol (PROVENTIL HFA;VENTOLIN HFA) 108 (90 Base) MCG/ACT inhaler Inhale 2 puffs into the lungs every 6 (six) hours as needed. 18 g 5  . alendronate (FOSAMAX) 70 MG tablet Take 1 tablet (70 mg total) by mouth once a week. Take with a full glass of water on an empty stomach. 12 tablet 4  . anastrozole (ARIMIDEX) 1 MG tablet Take 1 tablet (1 mg total) by mouth daily. 30 tablet 12  . aspirin EC 81 MG tablet Take 81 mg by mouth daily.    . benzonatate (TESSALON PERLES) 100 MG capsule Take 1 capsule (100 mg total) by mouth 3 (three) times daily as needed for cough. 20 capsule 2  . blood glucose meter kit and supplies  Diagnosis:E11.9 Test glucose twice daily 1 each 12  . brimonidine (ALPHAGAN P) 0.1 % SOLN 2 (two) times daily.     . canagliflozin (INVOKANA) 100 MG TABS tablet Take 1 tablet (100 mg total) by mouth daily. 90 tablet 4  . clopidogrel (PLAVIX) 75 MG tablet Take 1 tablet (75 mg total) by mouth daily. 90 tablet 4  . cyanocobalamin (,VITAMIN B-12,) 1000 MCG/ML injection Inject 1 mL (1,000 mcg total) into the muscle every 30 (thirty) days. 1 mL 12  . digoxin (LANOXIN) 0.125 MG  tablet Take 1 tablet (125 mcg total) by mouth daily. 90 tablet 1  . dorzolamide (TRUSOPT) 2 % ophthalmic solution 1 drop 2 (two) times daily.     . fluticasone (FLONASE) 50 MCG/ACT nasal spray Place 2 sprays into both nostrils daily. 16 g 12  . Fluticasone-Salmeterol (ADVAIR) 250-50 MCG/DOSE AEPB Inhale 1 puff into the lungs every 12 (twelve) hours. 180 each 4  . glucose blood test strip Use as instructed 100 each 12  . LEVEMIR FLEXTOUCH 100 UNIT/ML Pen INJECT 23 TO 30 UNITS PER DAY 15 mL 11  . levothyroxine (SYNTHROID, LEVOTHROID) 100 MCG tablet Take 1 tablet (100 mcg total) by mouth daily. 90 tablet 0  . lisinopril (PRINIVIL,ZESTRIL) 10 MG tablet Take 1 tablet (10 mg total) by mouth daily. 90 tablet 4  . meclizine (ANTIVERT) 25 MG tablet Take 1 tablet (25 mg total) by mouth 3 (three) times daily as needed. 90 tablet 1  . metFORMIN (GLUCOPHAGE) 500 MG tablet Take 1 tablet (500 mg total) by mouth 2 (two) times daily with a meal. 180 tablet 4  . metoprolol succinate (TOPROL-XL) 100 MG 24 hr tablet Take 1 tablet (100 mg total) by mouth daily. 90 tablet 4  . mometasone (ELOCON) 0.1 % cream Apply 1 application topically daily. Do not use day in and day out 45 g 2  . mupirocin ointment (BACTROBAN) 2 % Apply 1 application topically 2 (two) times daily. Do not use day in and day out 22 g 2  . nystatin (MYCOSTATIN/NYSTOP) powder Apply topically 4 (four) times daily. 15 g 0  . Olopatadine HCl (PATADAY) 0.2 % SOLN Apply to eye daily.    .  solifenacin (VESICARE) 5 MG tablet Take 2 tablets (10 mg total) by mouth daily. 180 tablet 4  . travoprost, benzalkonium, (TRAVATAN) 0.004 % ophthalmic solution Apply 1 drop to eye at bedtime.    . Vitamin D, Ergocalciferol, (DRISDOL) 50000 units CAPS capsule Take 1 capsule (50,000 Units total) by mouth every 7 (seven) days. 12 capsule 0   No current facility-administered medications on file prior to visit.    There are no Patient Instructions on file for this visit. Return in about 3 months (around 05/16/2016) for Discussion about laser.  Embree Brawley A Shaune Westfall, PA-C

## 2016-02-17 ENCOUNTER — Other Ambulatory Visit: Payer: Self-pay | Admitting: Family Medicine

## 2016-02-17 ENCOUNTER — Telehealth: Payer: Self-pay | Admitting: Family Medicine

## 2016-02-17 DIAGNOSIS — D510 Vitamin B12 deficiency anemia due to intrinsic factor deficiency: Secondary | ICD-10-CM | POA: Diagnosis not present

## 2016-02-17 DIAGNOSIS — E559 Vitamin D deficiency, unspecified: Secondary | ICD-10-CM

## 2016-02-17 DIAGNOSIS — E119 Type 2 diabetes mellitus without complications: Secondary | ICD-10-CM | POA: Diagnosis not present

## 2016-02-17 DIAGNOSIS — I4891 Unspecified atrial fibrillation: Secondary | ICD-10-CM | POA: Diagnosis not present

## 2016-02-17 DIAGNOSIS — I951 Orthostatic hypotension: Secondary | ICD-10-CM | POA: Diagnosis not present

## 2016-02-18 ENCOUNTER — Telehealth: Payer: Self-pay

## 2016-02-18 NOTE — Telephone Encounter (Signed)
Verbal order needed for B 12 injections and to continue home health through November.   Verbal from Dr. Wynetta Emery, order given

## 2016-02-19 DIAGNOSIS — E119 Type 2 diabetes mellitus without complications: Secondary | ICD-10-CM | POA: Diagnosis not present

## 2016-02-19 DIAGNOSIS — I951 Orthostatic hypotension: Secondary | ICD-10-CM | POA: Diagnosis not present

## 2016-02-19 DIAGNOSIS — I4891 Unspecified atrial fibrillation: Secondary | ICD-10-CM | POA: Diagnosis not present

## 2016-02-19 DIAGNOSIS — D510 Vitamin B12 deficiency anemia due to intrinsic factor deficiency: Secondary | ICD-10-CM | POA: Diagnosis not present

## 2016-02-20 DIAGNOSIS — B351 Tinea unguium: Secondary | ICD-10-CM | POA: Diagnosis not present

## 2016-02-20 DIAGNOSIS — E119 Type 2 diabetes mellitus without complications: Secondary | ICD-10-CM | POA: Diagnosis not present

## 2016-02-21 DIAGNOSIS — I951 Orthostatic hypotension: Secondary | ICD-10-CM | POA: Diagnosis not present

## 2016-02-21 DIAGNOSIS — D510 Vitamin B12 deficiency anemia due to intrinsic factor deficiency: Secondary | ICD-10-CM | POA: Diagnosis not present

## 2016-02-21 DIAGNOSIS — E119 Type 2 diabetes mellitus without complications: Secondary | ICD-10-CM | POA: Diagnosis not present

## 2016-02-21 DIAGNOSIS — I4891 Unspecified atrial fibrillation: Secondary | ICD-10-CM | POA: Diagnosis not present

## 2016-02-24 DIAGNOSIS — I951 Orthostatic hypotension: Secondary | ICD-10-CM | POA: Diagnosis not present

## 2016-02-24 DIAGNOSIS — E119 Type 2 diabetes mellitus without complications: Secondary | ICD-10-CM | POA: Diagnosis not present

## 2016-02-24 DIAGNOSIS — D510 Vitamin B12 deficiency anemia due to intrinsic factor deficiency: Secondary | ICD-10-CM | POA: Diagnosis not present

## 2016-02-24 DIAGNOSIS — I4891 Unspecified atrial fibrillation: Secondary | ICD-10-CM | POA: Diagnosis not present

## 2016-02-26 DIAGNOSIS — I4891 Unspecified atrial fibrillation: Secondary | ICD-10-CM | POA: Diagnosis not present

## 2016-02-26 DIAGNOSIS — E119 Type 2 diabetes mellitus without complications: Secondary | ICD-10-CM | POA: Diagnosis not present

## 2016-02-26 DIAGNOSIS — D510 Vitamin B12 deficiency anemia due to intrinsic factor deficiency: Secondary | ICD-10-CM | POA: Diagnosis not present

## 2016-02-26 DIAGNOSIS — I951 Orthostatic hypotension: Secondary | ICD-10-CM | POA: Diagnosis not present

## 2016-02-28 DIAGNOSIS — D510 Vitamin B12 deficiency anemia due to intrinsic factor deficiency: Secondary | ICD-10-CM | POA: Diagnosis not present

## 2016-02-28 DIAGNOSIS — I951 Orthostatic hypotension: Secondary | ICD-10-CM | POA: Diagnosis not present

## 2016-02-28 DIAGNOSIS — E119 Type 2 diabetes mellitus without complications: Secondary | ICD-10-CM | POA: Diagnosis not present

## 2016-02-28 DIAGNOSIS — I4891 Unspecified atrial fibrillation: Secondary | ICD-10-CM | POA: Diagnosis not present

## 2016-03-02 DIAGNOSIS — E119 Type 2 diabetes mellitus without complications: Secondary | ICD-10-CM | POA: Diagnosis not present

## 2016-03-02 DIAGNOSIS — D510 Vitamin B12 deficiency anemia due to intrinsic factor deficiency: Secondary | ICD-10-CM | POA: Diagnosis not present

## 2016-03-02 DIAGNOSIS — I4891 Unspecified atrial fibrillation: Secondary | ICD-10-CM | POA: Diagnosis not present

## 2016-03-02 DIAGNOSIS — I951 Orthostatic hypotension: Secondary | ICD-10-CM | POA: Diagnosis not present

## 2016-03-03 DIAGNOSIS — E119 Type 2 diabetes mellitus without complications: Secondary | ICD-10-CM | POA: Diagnosis not present

## 2016-03-03 DIAGNOSIS — I4891 Unspecified atrial fibrillation: Secondary | ICD-10-CM | POA: Diagnosis not present

## 2016-03-03 DIAGNOSIS — I951 Orthostatic hypotension: Secondary | ICD-10-CM | POA: Diagnosis not present

## 2016-03-03 DIAGNOSIS — D510 Vitamin B12 deficiency anemia due to intrinsic factor deficiency: Secondary | ICD-10-CM | POA: Diagnosis not present

## 2016-03-06 DIAGNOSIS — D510 Vitamin B12 deficiency anemia due to intrinsic factor deficiency: Secondary | ICD-10-CM | POA: Diagnosis not present

## 2016-03-06 DIAGNOSIS — I951 Orthostatic hypotension: Secondary | ICD-10-CM | POA: Diagnosis not present

## 2016-03-06 DIAGNOSIS — E119 Type 2 diabetes mellitus without complications: Secondary | ICD-10-CM | POA: Diagnosis not present

## 2016-03-06 DIAGNOSIS — I4891 Unspecified atrial fibrillation: Secondary | ICD-10-CM | POA: Diagnosis not present

## 2016-03-09 DIAGNOSIS — I951 Orthostatic hypotension: Secondary | ICD-10-CM | POA: Diagnosis not present

## 2016-03-09 DIAGNOSIS — D510 Vitamin B12 deficiency anemia due to intrinsic factor deficiency: Secondary | ICD-10-CM | POA: Diagnosis not present

## 2016-03-09 DIAGNOSIS — I4891 Unspecified atrial fibrillation: Secondary | ICD-10-CM | POA: Diagnosis not present

## 2016-03-09 DIAGNOSIS — E119 Type 2 diabetes mellitus without complications: Secondary | ICD-10-CM | POA: Diagnosis not present

## 2016-03-10 DIAGNOSIS — I951 Orthostatic hypotension: Secondary | ICD-10-CM | POA: Diagnosis not present

## 2016-03-10 DIAGNOSIS — I4891 Unspecified atrial fibrillation: Secondary | ICD-10-CM | POA: Diagnosis not present

## 2016-03-10 DIAGNOSIS — E119 Type 2 diabetes mellitus without complications: Secondary | ICD-10-CM | POA: Diagnosis not present

## 2016-03-10 DIAGNOSIS — D510 Vitamin B12 deficiency anemia due to intrinsic factor deficiency: Secondary | ICD-10-CM | POA: Diagnosis not present

## 2016-03-11 DIAGNOSIS — I951 Orthostatic hypotension: Secondary | ICD-10-CM | POA: Diagnosis not present

## 2016-03-11 DIAGNOSIS — D510 Vitamin B12 deficiency anemia due to intrinsic factor deficiency: Secondary | ICD-10-CM | POA: Diagnosis not present

## 2016-03-11 DIAGNOSIS — E119 Type 2 diabetes mellitus without complications: Secondary | ICD-10-CM | POA: Diagnosis not present

## 2016-03-11 DIAGNOSIS — I4891 Unspecified atrial fibrillation: Secondary | ICD-10-CM | POA: Diagnosis not present

## 2016-03-13 ENCOUNTER — Telehealth: Payer: Self-pay | Admitting: Family Medicine

## 2016-03-13 MED ORDER — AMOXICILLIN-POT CLAVULANATE 875-125 MG PO TABS: 1.0000 | ORAL_TABLET | Freq: Two times a day (BID) | ORAL | 0 refills | Status: DC

## 2016-03-13 NOTE — Telephone Encounter (Signed)
Miss Berezin cannot get in today. I've sent her Rx through to the pharmacy. Please try to get her in early next week to make sure this is what she needs. Thanks!

## 2016-03-13 NOTE — Telephone Encounter (Signed)
Patient notified, and appointment scheduled for 03/17/16 with Dr.Crissman.

## 2016-03-13 NOTE — Telephone Encounter (Signed)
Pt called stated she is very congested and feels horrible. Would like to know if a refill on Amoxicillin can be sent to the pharmacy for her. Pharm is [facility] on [address]. Thanks.

## 2016-03-16 DIAGNOSIS — E119 Type 2 diabetes mellitus without complications: Secondary | ICD-10-CM | POA: Diagnosis not present

## 2016-03-16 DIAGNOSIS — D510 Vitamin B12 deficiency anemia due to intrinsic factor deficiency: Secondary | ICD-10-CM | POA: Diagnosis not present

## 2016-03-16 DIAGNOSIS — I4891 Unspecified atrial fibrillation: Secondary | ICD-10-CM | POA: Diagnosis not present

## 2016-03-16 DIAGNOSIS — I951 Orthostatic hypotension: Secondary | ICD-10-CM | POA: Diagnosis not present

## 2016-03-17 ENCOUNTER — Ambulatory Visit (INDEPENDENT_AMBULATORY_CARE_PROVIDER_SITE_OTHER): Payer: Medicare Other | Admitting: Family Medicine

## 2016-03-17 ENCOUNTER — Encounter: Payer: Self-pay | Admitting: Family Medicine

## 2016-03-17 VITALS — BP 152/72 | HR 101 | Temp 97.6°F

## 2016-03-17 DIAGNOSIS — J019 Acute sinusitis, unspecified: Secondary | ICD-10-CM

## 2016-03-17 DIAGNOSIS — Z23 Encounter for immunization: Secondary | ICD-10-CM

## 2016-03-17 NOTE — Progress Notes (Signed)
BP (!) 152/72 (BP Location: Right Arm, Patient Position: Sitting, Cuff Size: Large)   Pulse (!) 101   Temp 97.6 F (36.4 C)   SpO2 99%    Subjective:    Patient ID: Mia Padilla, female    DOB: 07-Nov-1934, 81 y.o.   MRN: [id number]  HPI: Mia Padilla is a 81 y.o. female  Chief Complaint  Patient presents with  . URI   Patient follow-up taking Augmentin for sinusitis bronchitis and concerned getting slowly better but not completely still a lot of head pressure drainage and especially cough. Patient concerned she is having a lot of colds more than she has previously for years. On further questioning turns out grandchild is living with her that has colds all the time and she holds the baby a lot. Patient otherwise doing well concerned whether she can get the flu shot as she hasn't had it because she's had colds and coughing.  Patient also with right intermittent nosebleeds especially involving her nose hard Relevant past medical, surgical, family and social history reviewed and updated as indicated. Interim medical history since our last visit reviewed. Allergies and medications reviewed and updated.  Review of Systems  Constitutional: Positive for chills, diaphoresis and fatigue. Negative for fever.  Respiratory: Positive for cough. Negative for shortness of breath and wheezing.   Cardiovascular: Negative.     Per HPI unless specifically indicated above     Objective:    BP (!) 152/72 (BP Location: Right Arm, Patient Position: Sitting, Cuff Size: Large)   Pulse (!) 101   Temp 97.6 F (36.4 C)   SpO2 99%   Wt Readings from Last 3 Encounters:  02/14/16 199 lb (90.3 kg)  02/11/16 201 lb (91.2 kg)  01/06/16 198 lb 8 oz (90 kg)    Physical Exam  Constitutional: She is oriented to person, place, and time. She appears well-developed and well-nourished. No distress.  HENT:  Head: Normocephalic and atraumatic.  Right Ear: Hearing and external ear normal.  Left Ear: Hearing  and external ear normal.  Nose: Nose normal.  Mouth/Throat: Oropharyngeal exudate present.  Nose with crusting right septal area with some evidence of bleeding.  Eyes: Conjunctivae and lids are normal. Right eye exhibits no discharge. Left eye exhibits no discharge. No scleral icterus.  Neck: Normal range of motion. No thyromegaly present.  Cardiovascular: Normal rate, regular rhythm and normal heart sounds.   Pulmonary/Chest: Effort normal. No respiratory distress.  Some upper airway rhonchi with coughing  Musculoskeletal: Normal range of motion.  Lymphadenopathy:    She has no cervical adenopathy.  Neurological: She is alert and oriented to person, place, and time.  Skin: Skin is intact. No rash noted.  Psychiatric: She has a normal mood and affect. Her speech is normal and behavior is normal. Judgment and thought content normal. Cognition and memory are normal.    Results for orders placed or performed in visit on 02/11/16  TSH  Result Value Ref Range   TSH 7.080 (H) 0.450 - 4.500 uIU/mL  VITAMIN D 25 Hydroxy (Vit-D Deficiency, Fractures)  Result Value Ref Range   Vit D, 25-Hydroxy 7.5 (L) 30.0 - 100.0 ng/mL  Bayer DCA Hb A1c Waived  Result Value Ref Range   Bayer DCA Hb A1c Waived 6.4 <7.0 %      Assessment & Plan:   Problem List Items Addressed This Visit    None    Visit Diagnoses    Need for influenza vaccination    -  Primary   Relevant Orders   Flu vaccine HIGH DOSE PF (Fluzone High dose)   Acute sinusitis, recurrence not specified, unspecified location       Reviewed sinusitis care and treatment limitations of antibiotics and reasons for limitations. Reviewed over-the-counter Mucinex Tylenol etc.     Will continue Augmentin and finish out we will continue with Tessalon as helps Discussed okay to give flu shot today. Will do so. Reviewed recurrent colds with patient most likely from grandchild Reviewed thyroid patient will continue current dose Follow up  plan: Return for As scheduled.

## 2016-03-18 DIAGNOSIS — I4891 Unspecified atrial fibrillation: Secondary | ICD-10-CM | POA: Diagnosis not present

## 2016-03-18 DIAGNOSIS — E119 Type 2 diabetes mellitus without complications: Secondary | ICD-10-CM | POA: Diagnosis not present

## 2016-03-18 DIAGNOSIS — I951 Orthostatic hypotension: Secondary | ICD-10-CM | POA: Diagnosis not present

## 2016-03-18 DIAGNOSIS — D510 Vitamin B12 deficiency anemia due to intrinsic factor deficiency: Secondary | ICD-10-CM | POA: Diagnosis not present

## 2016-03-20 DIAGNOSIS — I951 Orthostatic hypotension: Secondary | ICD-10-CM | POA: Diagnosis not present

## 2016-03-20 DIAGNOSIS — I4891 Unspecified atrial fibrillation: Secondary | ICD-10-CM | POA: Diagnosis not present

## 2016-03-20 DIAGNOSIS — E119 Type 2 diabetes mellitus without complications: Secondary | ICD-10-CM | POA: Diagnosis not present

## 2016-03-20 DIAGNOSIS — D510 Vitamin B12 deficiency anemia due to intrinsic factor deficiency: Secondary | ICD-10-CM | POA: Diagnosis not present

## 2016-03-23 DIAGNOSIS — I951 Orthostatic hypotension: Secondary | ICD-10-CM | POA: Diagnosis not present

## 2016-03-23 DIAGNOSIS — E119 Type 2 diabetes mellitus without complications: Secondary | ICD-10-CM | POA: Diagnosis not present

## 2016-03-23 DIAGNOSIS — D510 Vitamin B12 deficiency anemia due to intrinsic factor deficiency: Secondary | ICD-10-CM | POA: Diagnosis not present

## 2016-03-23 DIAGNOSIS — I4891 Unspecified atrial fibrillation: Secondary | ICD-10-CM | POA: Diagnosis not present

## 2016-03-25 DIAGNOSIS — E119 Type 2 diabetes mellitus without complications: Secondary | ICD-10-CM | POA: Diagnosis not present

## 2016-03-25 DIAGNOSIS — D510 Vitamin B12 deficiency anemia due to intrinsic factor deficiency: Secondary | ICD-10-CM | POA: Diagnosis not present

## 2016-03-25 DIAGNOSIS — I4891 Unspecified atrial fibrillation: Secondary | ICD-10-CM | POA: Diagnosis not present

## 2016-03-25 DIAGNOSIS — I951 Orthostatic hypotension: Secondary | ICD-10-CM | POA: Diagnosis not present

## 2016-03-26 DIAGNOSIS — H401133 Primary open-angle glaucoma, bilateral, severe stage: Secondary | ICD-10-CM | POA: Diagnosis not present

## 2016-03-26 DIAGNOSIS — H5703 Miosis: Secondary | ICD-10-CM | POA: Diagnosis not present

## 2016-03-26 DIAGNOSIS — H21541 Posterior synechiae (iris), right eye: Secondary | ICD-10-CM | POA: Diagnosis not present

## 2016-03-26 DIAGNOSIS — H2513 Age-related nuclear cataract, bilateral: Secondary | ICD-10-CM | POA: Diagnosis not present

## 2016-03-27 DIAGNOSIS — I951 Orthostatic hypotension: Secondary | ICD-10-CM | POA: Diagnosis not present

## 2016-03-27 DIAGNOSIS — E119 Type 2 diabetes mellitus without complications: Secondary | ICD-10-CM | POA: Diagnosis not present

## 2016-03-27 DIAGNOSIS — D510 Vitamin B12 deficiency anemia due to intrinsic factor deficiency: Secondary | ICD-10-CM | POA: Diagnosis not present

## 2016-03-27 DIAGNOSIS — I4891 Unspecified atrial fibrillation: Secondary | ICD-10-CM | POA: Diagnosis not present

## 2016-03-30 DIAGNOSIS — I4891 Unspecified atrial fibrillation: Secondary | ICD-10-CM | POA: Diagnosis not present

## 2016-03-30 DIAGNOSIS — E119 Type 2 diabetes mellitus without complications: Secondary | ICD-10-CM | POA: Diagnosis not present

## 2016-03-30 DIAGNOSIS — I951 Orthostatic hypotension: Secondary | ICD-10-CM | POA: Diagnosis not present

## 2016-03-30 DIAGNOSIS — D510 Vitamin B12 deficiency anemia due to intrinsic factor deficiency: Secondary | ICD-10-CM | POA: Diagnosis not present

## 2016-03-31 DIAGNOSIS — E119 Type 2 diabetes mellitus without complications: Secondary | ICD-10-CM | POA: Diagnosis not present

## 2016-03-31 DIAGNOSIS — I4891 Unspecified atrial fibrillation: Secondary | ICD-10-CM | POA: Diagnosis not present

## 2016-03-31 DIAGNOSIS — D510 Vitamin B12 deficiency anemia due to intrinsic factor deficiency: Secondary | ICD-10-CM | POA: Diagnosis not present

## 2016-03-31 DIAGNOSIS — I951 Orthostatic hypotension: Secondary | ICD-10-CM | POA: Diagnosis not present

## 2016-03-31 NOTE — Telephone Encounter (Signed)
Opened in error

## 2016-04-01 ENCOUNTER — Other Ambulatory Visit: Payer: Self-pay

## 2016-04-01 DIAGNOSIS — C50412 Malignant neoplasm of upper-outer quadrant of left female breast: Secondary | ICD-10-CM

## 2016-04-02 DIAGNOSIS — I4891 Unspecified atrial fibrillation: Secondary | ICD-10-CM | POA: Diagnosis not present

## 2016-04-02 DIAGNOSIS — D510 Vitamin B12 deficiency anemia due to intrinsic factor deficiency: Secondary | ICD-10-CM | POA: Diagnosis not present

## 2016-04-02 DIAGNOSIS — I951 Orthostatic hypotension: Secondary | ICD-10-CM | POA: Diagnosis not present

## 2016-04-02 DIAGNOSIS — E119 Type 2 diabetes mellitus without complications: Secondary | ICD-10-CM | POA: Diagnosis not present

## 2016-04-03 DIAGNOSIS — D510 Vitamin B12 deficiency anemia due to intrinsic factor deficiency: Secondary | ICD-10-CM | POA: Diagnosis not present

## 2016-04-03 DIAGNOSIS — I951 Orthostatic hypotension: Secondary | ICD-10-CM | POA: Diagnosis not present

## 2016-04-03 DIAGNOSIS — E119 Type 2 diabetes mellitus without complications: Secondary | ICD-10-CM | POA: Diagnosis not present

## 2016-04-03 DIAGNOSIS — I4891 Unspecified atrial fibrillation: Secondary | ICD-10-CM | POA: Diagnosis not present

## 2016-04-07 DIAGNOSIS — D510 Vitamin B12 deficiency anemia due to intrinsic factor deficiency: Secondary | ICD-10-CM | POA: Diagnosis not present

## 2016-04-07 DIAGNOSIS — I4891 Unspecified atrial fibrillation: Secondary | ICD-10-CM | POA: Diagnosis not present

## 2016-04-07 DIAGNOSIS — E119 Type 2 diabetes mellitus without complications: Secondary | ICD-10-CM | POA: Diagnosis not present

## 2016-04-07 DIAGNOSIS — I951 Orthostatic hypotension: Secondary | ICD-10-CM | POA: Diagnosis not present

## 2016-04-08 DIAGNOSIS — I951 Orthostatic hypotension: Secondary | ICD-10-CM | POA: Diagnosis not present

## 2016-04-08 DIAGNOSIS — E119 Type 2 diabetes mellitus without complications: Secondary | ICD-10-CM | POA: Diagnosis not present

## 2016-04-08 DIAGNOSIS — D510 Vitamin B12 deficiency anemia due to intrinsic factor deficiency: Secondary | ICD-10-CM | POA: Diagnosis not present

## 2016-04-08 DIAGNOSIS — I4891 Unspecified atrial fibrillation: Secondary | ICD-10-CM | POA: Diagnosis not present

## 2016-04-09 ENCOUNTER — Other Ambulatory Visit: Payer: Self-pay

## 2016-04-09 DIAGNOSIS — Z1231 Encounter for screening mammogram for malignant neoplasm of breast: Secondary | ICD-10-CM

## 2016-04-10 DIAGNOSIS — I951 Orthostatic hypotension: Secondary | ICD-10-CM | POA: Diagnosis not present

## 2016-04-10 DIAGNOSIS — D510 Vitamin B12 deficiency anemia due to intrinsic factor deficiency: Secondary | ICD-10-CM | POA: Diagnosis not present

## 2016-04-10 DIAGNOSIS — I4891 Unspecified atrial fibrillation: Secondary | ICD-10-CM | POA: Diagnosis not present

## 2016-04-10 DIAGNOSIS — E119 Type 2 diabetes mellitus without complications: Secondary | ICD-10-CM | POA: Diagnosis not present

## 2016-04-14 DIAGNOSIS — I4891 Unspecified atrial fibrillation: Secondary | ICD-10-CM | POA: Diagnosis not present

## 2016-04-14 DIAGNOSIS — D510 Vitamin B12 deficiency anemia due to intrinsic factor deficiency: Secondary | ICD-10-CM | POA: Diagnosis not present

## 2016-04-14 DIAGNOSIS — E119 Type 2 diabetes mellitus without complications: Secondary | ICD-10-CM | POA: Diagnosis not present

## 2016-04-14 DIAGNOSIS — I951 Orthostatic hypotension: Secondary | ICD-10-CM | POA: Diagnosis not present

## 2016-04-15 DIAGNOSIS — E119 Type 2 diabetes mellitus without complications: Secondary | ICD-10-CM | POA: Diagnosis not present

## 2016-04-15 DIAGNOSIS — I951 Orthostatic hypotension: Secondary | ICD-10-CM | POA: Diagnosis not present

## 2016-04-15 DIAGNOSIS — I4891 Unspecified atrial fibrillation: Secondary | ICD-10-CM | POA: Diagnosis not present

## 2016-04-15 DIAGNOSIS — D510 Vitamin B12 deficiency anemia due to intrinsic factor deficiency: Secondary | ICD-10-CM | POA: Diagnosis not present

## 2016-04-17 DIAGNOSIS — I951 Orthostatic hypotension: Secondary | ICD-10-CM | POA: Diagnosis not present

## 2016-04-17 DIAGNOSIS — E119 Type 2 diabetes mellitus without complications: Secondary | ICD-10-CM | POA: Diagnosis not present

## 2016-04-17 DIAGNOSIS — D510 Vitamin B12 deficiency anemia due to intrinsic factor deficiency: Secondary | ICD-10-CM | POA: Diagnosis not present

## 2016-04-17 DIAGNOSIS — I4891 Unspecified atrial fibrillation: Secondary | ICD-10-CM | POA: Diagnosis not present

## 2016-04-21 DIAGNOSIS — I4891 Unspecified atrial fibrillation: Secondary | ICD-10-CM | POA: Diagnosis not present

## 2016-04-21 DIAGNOSIS — D510 Vitamin B12 deficiency anemia due to intrinsic factor deficiency: Secondary | ICD-10-CM | POA: Diagnosis not present

## 2016-04-21 DIAGNOSIS — E119 Type 2 diabetes mellitus without complications: Secondary | ICD-10-CM | POA: Diagnosis not present

## 2016-04-21 DIAGNOSIS — I951 Orthostatic hypotension: Secondary | ICD-10-CM | POA: Diagnosis not present

## 2016-04-22 DIAGNOSIS — I951 Orthostatic hypotension: Secondary | ICD-10-CM | POA: Diagnosis not present

## 2016-04-22 DIAGNOSIS — E119 Type 2 diabetes mellitus without complications: Secondary | ICD-10-CM | POA: Diagnosis not present

## 2016-04-22 DIAGNOSIS — D510 Vitamin B12 deficiency anemia due to intrinsic factor deficiency: Secondary | ICD-10-CM | POA: Diagnosis not present

## 2016-04-22 DIAGNOSIS — I4891 Unspecified atrial fibrillation: Secondary | ICD-10-CM | POA: Diagnosis not present

## 2016-04-24 DIAGNOSIS — I951 Orthostatic hypotension: Secondary | ICD-10-CM | POA: Diagnosis not present

## 2016-04-24 DIAGNOSIS — I4891 Unspecified atrial fibrillation: Secondary | ICD-10-CM | POA: Diagnosis not present

## 2016-04-24 DIAGNOSIS — E119 Type 2 diabetes mellitus without complications: Secondary | ICD-10-CM | POA: Diagnosis not present

## 2016-04-24 DIAGNOSIS — D510 Vitamin B12 deficiency anemia due to intrinsic factor deficiency: Secondary | ICD-10-CM | POA: Diagnosis not present

## 2016-04-27 DIAGNOSIS — I951 Orthostatic hypotension: Secondary | ICD-10-CM | POA: Diagnosis not present

## 2016-04-27 DIAGNOSIS — E119 Type 2 diabetes mellitus without complications: Secondary | ICD-10-CM | POA: Diagnosis not present

## 2016-04-27 DIAGNOSIS — I4891 Unspecified atrial fibrillation: Secondary | ICD-10-CM | POA: Diagnosis not present

## 2016-04-27 DIAGNOSIS — D510 Vitamin B12 deficiency anemia due to intrinsic factor deficiency: Secondary | ICD-10-CM | POA: Diagnosis not present

## 2016-04-29 DIAGNOSIS — I4891 Unspecified atrial fibrillation: Secondary | ICD-10-CM | POA: Diagnosis not present

## 2016-04-29 DIAGNOSIS — D510 Vitamin B12 deficiency anemia due to intrinsic factor deficiency: Secondary | ICD-10-CM | POA: Diagnosis not present

## 2016-04-29 DIAGNOSIS — E119 Type 2 diabetes mellitus without complications: Secondary | ICD-10-CM | POA: Diagnosis not present

## 2016-04-29 DIAGNOSIS — I951 Orthostatic hypotension: Secondary | ICD-10-CM | POA: Diagnosis not present

## 2016-04-30 DIAGNOSIS — D510 Vitamin B12 deficiency anemia due to intrinsic factor deficiency: Secondary | ICD-10-CM | POA: Diagnosis not present

## 2016-04-30 DIAGNOSIS — I951 Orthostatic hypotension: Secondary | ICD-10-CM | POA: Diagnosis not present

## 2016-04-30 DIAGNOSIS — I4891 Unspecified atrial fibrillation: Secondary | ICD-10-CM | POA: Diagnosis not present

## 2016-04-30 DIAGNOSIS — E119 Type 2 diabetes mellitus without complications: Secondary | ICD-10-CM | POA: Diagnosis not present

## 2016-05-03 DIAGNOSIS — J209 Acute bronchitis, unspecified: Secondary | ICD-10-CM | POA: Diagnosis not present

## 2016-05-05 ENCOUNTER — Ambulatory Visit: Payer: Medicare Other | Admitting: Family Medicine

## 2016-05-05 DIAGNOSIS — I951 Orthostatic hypotension: Secondary | ICD-10-CM | POA: Diagnosis not present

## 2016-05-05 DIAGNOSIS — D510 Vitamin B12 deficiency anemia due to intrinsic factor deficiency: Secondary | ICD-10-CM | POA: Diagnosis not present

## 2016-05-05 DIAGNOSIS — I4891 Unspecified atrial fibrillation: Secondary | ICD-10-CM | POA: Diagnosis not present

## 2016-05-05 DIAGNOSIS — E119 Type 2 diabetes mellitus without complications: Secondary | ICD-10-CM | POA: Diagnosis not present

## 2016-05-08 ENCOUNTER — Emergency Department: Payer: Medicare Other

## 2016-05-08 ENCOUNTER — Emergency Department
Admission: EM | Admit: 2016-05-08 | Discharge: 2016-05-08 | Disposition: A | Discharge status: Home / Self Care | Payer: Medicare Other | Attending: Emergency Medicine | Admitting: Emergency Medicine

## 2016-05-08 ENCOUNTER — Encounter: Payer: Self-pay | Admitting: Intensive Care

## 2016-05-08 DIAGNOSIS — Z87891 Personal history of nicotine dependence: Secondary | ICD-10-CM | POA: Insufficient documentation

## 2016-05-08 DIAGNOSIS — I110 Hypertensive heart disease with heart failure: Secondary | ICD-10-CM | POA: Insufficient documentation

## 2016-05-08 DIAGNOSIS — I5030 Unspecified diastolic (congestive) heart failure: Secondary | ICD-10-CM | POA: Diagnosis not present

## 2016-05-08 DIAGNOSIS — R0602 Shortness of breath: Secondary | ICD-10-CM | POA: Diagnosis not present

## 2016-05-08 DIAGNOSIS — E119 Type 2 diabetes mellitus without complications: Secondary | ICD-10-CM | POA: Diagnosis not present

## 2016-05-08 DIAGNOSIS — J441 Chronic obstructive pulmonary disease with (acute) exacerbation: Secondary | ICD-10-CM | POA: Insufficient documentation

## 2016-05-08 DIAGNOSIS — Z853 Personal history of malignant neoplasm of breast: Secondary | ICD-10-CM | POA: Insufficient documentation

## 2016-05-08 DIAGNOSIS — Z794 Long term (current) use of insulin: Secondary | ICD-10-CM | POA: Diagnosis not present

## 2016-05-08 DIAGNOSIS — E039 Hypothyroidism, unspecified: Secondary | ICD-10-CM | POA: Insufficient documentation

## 2016-05-08 DIAGNOSIS — Z79899 Other long term (current) drug therapy: Secondary | ICD-10-CM | POA: Insufficient documentation

## 2016-05-08 HISTORY — DX: Chronic obstructive pulmonary disease, unspecified: J44.9

## 2016-05-08 LAB — CBC WITH DIFFERENTIAL/PLATELET
BASOS PCT: 0 %
Basophils Absolute: 0 10*3/uL (ref 0–0.1)
EOS ABS: 0.1 10*3/uL (ref 0–0.7)
Eosinophils Relative: 2 %
HCT: 38.1 % (ref 35.0–47.0)
HEMOGLOBIN: 12.6 g/dL (ref 12.0–16.0)
Lymphocytes Relative: 20 %
Lymphs Abs: 0.7 10*3/uL — ABNORMAL LOW (ref 1.0–3.6)
MCH: 30.1 pg (ref 26.0–34.0)
MCHC: 33 g/dL (ref 32.0–36.0)
MCV: 91.2 fL (ref 80.0–100.0)
MONO ABS: 0.3 10*3/uL (ref 0.2–0.9)
MONOS PCT: 10 %
NEUTROS PCT: 68 %
Neutro Abs: 2.3 10*3/uL (ref 1.4–6.5)
Platelets: 216 10*3/uL (ref 150–440)
RBC: 4.18 MIL/uL (ref 3.80–5.20)
RDW: 15.1 % — AB (ref 11.5–14.5)
WBC: 3.4 10*3/uL — ABNORMAL LOW (ref 3.6–11.0)

## 2016-05-08 LAB — COMPREHENSIVE METABOLIC PANEL
ALT: 20 U/L (ref 14–54)
AST: 45 U/L — ABNORMAL HIGH (ref 15–41)
Albumin: 3.4 g/dL — ABNORMAL LOW (ref 3.5–5.0)
Alkaline Phosphatase: 45 U/L (ref 38–126)
Anion gap: 6 (ref 5–15)
BUN: 15 mg/dL (ref 6–20)
CHLORIDE: 99 mmol/L — AB (ref 101–111)
CO2: 31 mmol/L (ref 22–32)
Calcium: 8.8 mg/dL — ABNORMAL LOW (ref 8.9–10.3)
Creatinine, Ser: 0.75 mg/dL (ref 0.44–1.00)
GFR calc non Af Amer: >60 mL/min (ref >60)
Glucose, Bld: 131 mg/dL — ABNORMAL HIGH (ref 65–99)
Potassium: 4.3 mmol/L (ref 3.5–5.1)
SODIUM: 136 mmol/L (ref 135–145)
Total Bilirubin: 1 mg/dL (ref 0.3–1.2)
Total Protein: 8.6 g/dL — ABNORMAL HIGH (ref 6.5–8.1)

## 2016-05-08 LAB — DIGOXIN LEVEL

## 2016-05-08 LAB — TROPONIN I
TROPONIN I: 0.03 ng/mL — AB (ref <0.03)
TROPONIN I: 0.03 ng/mL — AB (ref <0.03)

## 2016-05-08 LAB — BRAIN NATRIURETIC PEPTIDE: B Natriuretic Peptide: 207 pg/mL — ABNORMAL HIGH (ref 0.0–100.0)

## 2016-05-08 MED ORDER — METHYLPREDNISOLONE SODIUM SUCC 125 MG IJ SOLR
125.0000 mg | Freq: Once | INTRAMUSCULAR | Status: AC
  Administered 2016-05-08: 125 mg via INTRAVENOUS
  Filled 2016-05-08: qty 2

## 2016-05-08 MED ORDER — DIGOXIN 125 MCG PO TABS
0.5000 mg | ORAL_TABLET | Freq: Once | ORAL | Status: AC
  Administered 2016-05-08: 0.5 mg via ORAL
  Filled 2016-05-08: qty 4

## 2016-05-08 MED ORDER — ALBUTEROL SULFATE (2.5 MG/3ML) 0.083% IN NEBU
5.0000 mg | INHALATION_SOLUTION | Freq: Once | RESPIRATORY_TRACT | Status: AC
  Administered 2016-05-08: 2.5 mg via RESPIRATORY_TRACT
  Filled 2016-05-08: qty 6

## 2016-05-08 MED ORDER — PREDNISONE 10 MG PO TABS: ORAL_TABLET | ORAL | 0 refills | Status: DC

## 2016-05-08 MED ORDER — ALBUTEROL SULFATE HFA 108 (90 BASE) MCG/ACT IN AERS: 2.0000 | INHALATION_SPRAY | Freq: Four times a day (QID) | RESPIRATORY_TRACT | 0 refills | Status: DC | PRN

## 2016-05-08 MED ORDER — IPRATROPIUM BROMIDE 0.02 % IN SOLN
0.5000 mg | Freq: Once | RESPIRATORY_TRACT | Status: AC
  Administered 2016-05-08: 0.5 mg via RESPIRATORY_TRACT
  Filled 2016-05-08: qty 2.5

## 2016-05-08 MED ORDER — DIGOXIN 125 MCG PO TABS: 0.2500 mg | ORAL_TABLET | Freq: Once | ORAL | Status: DC

## 2016-05-08 MED ORDER — DIGOXIN 125 MCG PO TABS
ORAL_TABLET | ORAL | Status: AC
  Filled 2016-05-08: qty 2

## 2016-05-08 MED ORDER — DIGOXIN 125 MCG PO TABS: ORAL_TABLET | ORAL | 0 refills | Status: DC

## 2016-05-08 NOTE — ED Triage Notes (Signed)
Patient presents to ER with c/o SOB since last night. Also c/o headache and congestion. HX COPD and asthma. A&O x4. NAD noted in triage. Pt able to speak complete sentences

## 2016-05-08 NOTE — ED Provider Notes (Signed)
[facility] Provider Note   CSN: [id number] Arrival date & time: 05/08/16  1012     History   Chief Complaint Chief Complaint  Patient presents with  . Shortness of Breath    HPI Mia Padilla is a 82 y.o. female hx of COPD, DM, HTN, here with cough, shortness of breath. Patient has been having productive cough for the last week or so. Patient states that she went to urgent care about 5 days ago and was diagnosed with pneumonia and prescribed Augmentin. Patient states that she has been having persistent cough since. Patient woke up this morning and has worsening shortness of breath. Denies any fevers today and denies any chest pain. Patient was brought back because initial EKG showed possible J-point elevation. No hx of CAD or stents.   The history is provided by the patient.    Past Medical History:  Diagnosis Date  . Arthritis    hands  . Black head   . Bowel incontinence   . Breast cancer of upper-outer quadrant of left female breast ([place]) 2015  . Carpal tunnel syndrome   . COPD (chronic obstructive pulmonary disease) ([place])   . Debilitated   . Diabetes mellitus without complication ([place])   . Glaucoma   . History of seizures   . Hyperlipidemia   . Hypertension   . Hypotension   . Obesity   . Urinary incontinence     Patient Active Problem List   Diagnosis Date Noted  . Lymphedema 02/14/2016  . Chronic venous insufficiency 02/14/2016  . Vitamin D deficiency 11/12/2015  . B12 deficiency 11/11/2015  . Peripheral vascular disease ([place]) 04/23/2015  . Osteoarthritis 04/23/2015  . Hypothyroidism 02/07/2015  . Urinary incontinence 02/07/2015  . Breast cancer ([place]) 11/15/2013  . Malignant neoplasm of upper-outer quadrant of female breast ([place]) 06/28/2013  . Diastolic CHF ([place]) 00/76/2263  . Hypertension 10/15/2011  . A-fib ([place]) 06/04/2011  . COPD (chronic obstructive pulmonary disease) ([place]) 06/04/2011  . Obese 06/04/2011  . Seizures ([place]) 06/04/2011   . History of CVA (cerebrovascular accident) 06/04/2011  . Diabetes mellitus ([place]) 06/04/2011    Past Surgical History:  Procedure Laterality Date  . BOWEL RESECTION  2005  . BREAST SURGERY  08/03/13   left mastectomy  . CARPAL TUNNEL RELEASE Right 30 years   . MASTECTOMY     left   . PARTIAL HYSTERECTOMY    . TONSILLECTOMY    . UMBILICAL HERNIA REPAIR      OB History    Gravida Para Term Preterm AB Living   '4 3     1 3   '$ SAB TAB Ectopic Multiple Live Births   1              Obstetric Comments   1st Menstrual Cycle:  14 1st Pregnancy:  1961       Home Medications    Prior to Admission medications   Medication Sig Start Date End Date Taking? Authorizing Provider  acetaminophen (TYLENOL) 500 MG tablet Take 500 mg by mouth every 6 (six) hours as needed.   Yes Historical Provider, MD  albuterol (PROVENTIL HFA;VENTOLIN HFA) 108 (90 Base) MCG/ACT inhaler Inhale 2 puffs into the lungs every 6 (six) hours as needed. 06/14/15  Yes Megan P Johnson, DO  alendronate (FOSAMAX) 70 MG tablet Take 1 tablet (70 mg total) by mouth once a week. Take with a full glass of water on an empty stomach. 08/26/15  Yes [name], MD  amoxicillin-clavulanate (  AUGMENTIN) 875-125 MG tablet Take 1 tablet by mouth 2 (two) times daily. Patient taking differently: Take 1 tablet by mouth 2 (two) times daily. Started Sunday the 14th to last until Wednesday morning the 24th 03/13/16  Yes Megan P Johnson, DO  anastrozole (ARIMIDEX) 1 MG tablet Take 1 tablet (1 mg total) by mouth daily. 08/21/15  Yes Seeplaputhur Robinette Haines, MD  aspirin EC 81 MG tablet Take 81 mg by mouth daily.   Yes Historical Provider, MD  benzonatate (TESSALON PERLES) 100 MG capsule Take 1 capsule (100 mg total) by mouth 3 (three) times daily as needed for cough. 01/08/16  Yes [name], MD  brimonidine (ALPHAGAN P) 0.1 % SOLN 2 (two) times daily.    Yes Historical Provider, MD  clopidogrel (PLAVIX) 75 MG tablet Take 1 tablet (75 mg  total) by mouth daily. 08/26/15  Yes [name], MD  cyanocobalamin (,VITAMIN B-12,) 1000 MCG/ML injection Inject 1 mL (1,000 mcg total) into the muscle every 30 (thirty) days. 10/08/15  Yes [name], MD  digoxin (LANOXIN) 0.125 MG tablet Take 1 tablet (125 mcg total) by mouth daily. 08/27/15  Yes [name], MD  dorzolamide (TRUSOPT) 2 % ophthalmic solution 1 drop 2 (two) times daily.    Yes Historical Provider, MD  fluticasone (FLONASE) 50 MCG/ACT nasal spray Place 2 sprays into both nostrils daily. 10/14/15  Yes Volney American, PA-C  Fluticasone-Salmeterol (ADVAIR) 250-50 MCG/DOSE AEPB Inhale 1 puff into the lungs every 12 (twelve) hours. 08/26/15  Yes [name], MD  LEVEMIR FLEXTOUCH 100 UNIT/ML Pen INJECT 23 TO 30 UNITS PER DAY Patient taking differently: Inject 26 Units into the skin at bedtime. INJECT 23 TO 30 UNITS PER DAY 08/26/15  Yes [name], MD  levothyroxine (SYNTHROID, LEVOTHROID) 100 MCG tablet Take 1 tablet (100 mcg total) by mouth daily. 02/12/16  Yes [name], MD  lisinopril (PRINIVIL,ZESTRIL) 10 MG tablet Take 1 tablet (10 mg total) by mouth daily. 08/26/15  Yes [name], MD  meclizine (ANTIVERT) 25 MG tablet Take 1 tablet (25 mg total) by mouth 3 (three) times daily as needed. 08/26/15  Yes [name], MD  metFORMIN (GLUCOPHAGE) 500 MG tablet Take 1 tablet (500 mg total) by mouth 2 (two) times daily with a meal. 08/26/15  Yes [name], MD  metoprolol succinate (TOPROL-XL) 100 MG 24 hr tablet Take 1 tablet (100 mg total) by mouth daily. 08/26/15  Yes [name], MD  mometasone (ELOCON) 0.1 % cream Apply 1 application topically daily. Do not use day in and day out 08/27/15  Yes [name], MD  mupirocin ointment (BACTROBAN) 2 % Apply 1 application topically 2 (two) times daily. Do not use day in and day out 08/27/15  Yes [name], MD  nystatin (MYCOSTATIN/NYSTOP) powder APPY TO THE AFFECTED AREA 4 TIMES DAILY 02/17/16  Yes  Megan P Johnson, DO  Olopatadine HCl (PATADAY) 0.2 % SOLN Apply to eye daily.   Yes Historical Provider, MD  solifenacin (VESICARE) 5 MG tablet Take 2 tablets (10 mg total) by mouth daily. 08/26/15  Yes [name], MD  travoprost, benzalkonium, (TRAVATAN) 0.004 % ophthalmic solution Apply 1 drop to eye at bedtime.   Yes Historical Provider, MD  Vitamin D, Ergocalciferol, (DRISDOL) 50000 units CAPS capsule TAKE 1 CAPSULE BY MOUTH EVERY 7 DAYS. 02/17/16  Yes Megan P Johnson, DO  blood glucose meter kit and supplies Diagnosis:E11.9 Test glucose twice  daily 11/21/14   Megan P Johnson, DO  canagliflozin (INVOKANA) 100 MG TABS tablet Take 1 tablet (100 mg total) by mouth daily. Patient not taking: Reported on 05/08/2016 08/26/15   [name], MD  glucose blood test strip Use as instructed 11/05/14   [name], MD    Family History Family History  Problem Relation Age of Onset  . Cervical cancer Mother   . Diabetes Mother   . Glaucoma Mother   . Cervical cancer Daughter   . Hypertension Daughter   . Hypertension Father   . Hypertension Sister   . Hypertension Brother   . Hypertension Son   . Diabetes Brother   . HIV Brother     Social History Social History  Substance Use Topics  . Smoking status: Former Research scientist (life sciences)  . Smokeless tobacco: Never Used     Comment: Quit 1976  . Alcohol use No     Allergies   Propoxyphene; Advil [ibuprofen]; Darvocet [propoxyphene n-acetaminophen]; and Percocet [oxycodone-acetaminophen]   Review of Systems Review of Systems  Respiratory: Positive for cough and shortness of breath.   All other systems reviewed and are negative.    Physical Exam Updated Vital Signs BP 134/64 (BP Location: Right Arm)   Pulse 92   Temp 98.2 F (36.8 C) (Oral)   Resp 18   Ht $R'5\' 4"'Va$  (1.626 m)   Wt 195 lb (88.5 kg)   SpO2 93%   BMI 33.47 kg/m   Physical Exam  Constitutional: She is oriented to person, place, and time.  Slightly tachypneic   HENT:    Head: Normocephalic.  Mouth/Throat: Oropharynx is clear and moist.  Eyes: EOM are normal. Pupils are equal, round, and reactive to light.  Neck: Normal range of motion. Neck supple.  Cardiovascular: Normal rate, regular rhythm and normal heart sounds.   Pulmonary/Chest:  Slightly tachypneic, mild diffuse wheezing, no crackles   Abdominal: Soft. Bowel sounds are normal. She exhibits no distension. There is no tenderness.  Musculoskeletal: Normal range of motion.  Neurological: She is alert and oriented to person, place, and time.  Skin: Skin is warm.  Psychiatric: She has a normal mood and affect.  Nursing note and vitals reviewed.    ED Treatments / Results  Labs (all labs ordered are listed, but only abnormal results are displayed) Labs Reviewed  CBC WITH DIFFERENTIAL/PLATELET - Abnormal; Notable for the following:       Result Value   WBC 3.4 (*)    RDW 15.1 (*)    Lymphs Abs 0.7 (*)    All other components within normal limits  TROPONIN I - Abnormal; Notable for the following:    Troponin I 0.03 (*)    All other components within normal limits  COMPREHENSIVE METABOLIC PANEL - Abnormal; Notable for the following:    Chloride 99 (*)    Glucose, Bld 131 (*)    Calcium 8.8 (*)    Total Protein 8.6 (*)    Albumin 3.4 (*)    AST 45 (*)    All other components within normal limits  BRAIN NATRIURETIC PEPTIDE - Abnormal; Notable for the following:    B Natriuretic Peptide 207.0 (*)    All other components within normal limits  DIGOXIN LEVEL  TROPONIN I    EKG  EKG Interpretation None      ED ECG REPORT I, Wandra Arthurs, the attending physician, personally viewed and interpreted this ECG.   Date: 05/08/2016  EKG Time: 10:28 am  Rate: 95  Rhythm: normal EKG, normal sinus rhythm  Axis: left  Intervals:none  ST&T Change: J point vs subtle elevation V 2     Date: 05/08/2016  EKG Time: 10:41 am  Rate: 99  Rhythm: normal EKG, normal sinus rhythm  Axis: left   Intervals:none  ST&T Change: J point vs subtle elevation V 2, no reciprocal changes   Radiology Dg Chest 2 View  Result Date: 05/08/2016 CLINICAL DATA:  Shortness of breath and congestion since last night. EXAM: CHEST  2 VIEW COMPARISON:  PA and lateral chest 06/20/2015 and 04/04/2011. FINDINGS: There is cardiomegaly without edema. No pneumothorax or pleural effusion. Aortic atherosclerosis is noted. No acute bony abnormality. IMPRESSION: Cardiomegaly without acute disease. Atherosclerosis. Electronically Signed   By: Inge Rise M.D.   On: 05/08/2016 11:19    Procedures Procedures (including critical care time)  Medications Ordered in ED Medications  albuterol (PROVENTIL) (2.5 MG/3ML) 0.083% nebulizer solution 5 mg (2.5 mg Nebulization Given 05/08/16 1144)  ipratropium (ATROVENT) nebulizer solution 0.5 mg (0.5 mg Nebulization Given 05/08/16 1144)  methylPREDNISolone sodium succinate (SOLU-MEDROL) 125 mg/2 mL injection 125 mg (125 mg Intravenous Given 05/08/16 1144)     Initial Impression / Assessment and Plan / ED Course  I have reviewed the triage vital signs and the nursing notes.  Pertinent labs & imaging results that were available during my care of the patient were reviewed by me and considered in my medical decision making (see chart for details).     BAHJA BENCE is a 82 y.o. female here with SOB, cough. Afebrile, already on augmentin. Has wheezing on exam, no chest pain. I think likely COPD exacerbation. Will get labs, CXR. Will give nebs, steroids. Patient has no chest pain and EKGs in the ED showed possible elevation V2 but no reciprocal changes and I think likely J point elevations.   3 pm Trop 0.03 initially. CXR unremarkable. Felt better after nebs, steroids. Will check second trop, ambulate patient and if stable, can be discharged with steroids. Patient already on augmentin. Signed out to Dr. Reita Cliche in the ED.   Final Clinical Impressions(s) / ED Diagnoses   Final  diagnoses:  None    New Prescriptions New Prescriptions   No medications on file     Drenda Freeze, MD 05/08/16 1600

## 2016-05-08 NOTE — ED Provider Notes (Signed)
I received patient care at shift change. Patient awaiting dig level and troponin level.  Patient able to discharge home as COPD exacerbation with prednisone burst.   Digoxin level was undetectable. I spoke with Dr. Towanda Malkin about dig loading. He recommended 0.5 mg by mouth, and then 4 hours later 0.25, and then tomorrow resume her current dose 0.125. I discussed this plan with patient and her family.    Lisa Roca, MD 05/08/16 [phone]

## 2016-05-08 NOTE — Discharge Instructions (Signed)
Return to the emergency department for any worsening trouble breathing, shortness breath, chest pain, fever, dizziness, altered mental status, or any other symptoms concerning to you.  You also given a loading dose of digoxin here in the emergency department. You need to take 0.25 mg, this is probably 2 tablets of your 0.125 mg tablets at 10 PM tonight, this would be 4 hours later. Tomorrow you can start your normal dose 0.125.

## 2016-05-08 NOTE — ED Notes (Signed)
Dose of digoxin given to pt for her night time dose for home per Dr. Reita Cliche.

## 2016-05-08 NOTE — ED Notes (Signed)
Pt ambulated well with walker with no difficulty

## 2016-05-08 NOTE — ED Notes (Addendum)
Dr. Dominic Pea at bedside.  Repeat EKG done.  Pt with productive cough.

## 2016-05-08 NOTE — ED Notes (Signed)
Called lab to check on status of patients digoxin level, per lab specimen is currently being ran and they will call this RN back when results are available.

## 2016-05-11 DIAGNOSIS — E119 Type 2 diabetes mellitus without complications: Secondary | ICD-10-CM | POA: Diagnosis not present

## 2016-05-11 DIAGNOSIS — I951 Orthostatic hypotension: Secondary | ICD-10-CM | POA: Diagnosis not present

## 2016-05-11 DIAGNOSIS — I4891 Unspecified atrial fibrillation: Secondary | ICD-10-CM | POA: Diagnosis not present

## 2016-05-11 DIAGNOSIS — D510 Vitamin B12 deficiency anemia due to intrinsic factor deficiency: Secondary | ICD-10-CM | POA: Diagnosis not present

## 2016-05-12 ENCOUNTER — Ambulatory Visit (INDEPENDENT_AMBULATORY_CARE_PROVIDER_SITE_OTHER): Payer: Medicare Other | Admitting: Cardiovascular Disease

## 2016-05-12 ENCOUNTER — Encounter: Payer: Self-pay | Admitting: Cardiovascular Disease

## 2016-05-12 ENCOUNTER — Ambulatory Visit: Payer: Medicare Other | Admitting: Family Medicine

## 2016-05-12 VITALS — BP 166/80 | HR 82 | Ht 66.0 in | Wt 186.2 lb

## 2016-05-12 DIAGNOSIS — E1151 Type 2 diabetes mellitus with diabetic peripheral angiopathy without gangrene: Secondary | ICD-10-CM

## 2016-05-12 DIAGNOSIS — J432 Centrilobular emphysema: Secondary | ICD-10-CM | POA: Diagnosis not present

## 2016-05-12 DIAGNOSIS — I10 Essential (primary) hypertension: Secondary | ICD-10-CM | POA: Diagnosis not present

## 2016-05-12 DIAGNOSIS — I480 Paroxysmal atrial fibrillation: Secondary | ICD-10-CM | POA: Diagnosis not present

## 2016-05-12 DIAGNOSIS — I739 Peripheral vascular disease, unspecified: Secondary | ICD-10-CM

## 2016-05-12 DIAGNOSIS — I5032 Chronic diastolic (congestive) heart failure: Secondary | ICD-10-CM

## 2016-05-12 MED ORDER — FUROSEMIDE 20 MG PO TABS: 20.0000 mg | ORAL_TABLET | Freq: Every day | ORAL | 6 refills | Status: DC | PRN

## 2016-05-12 NOTE — Patient Instructions (Addendum)
Medication Instructions:   No medication changes made  Please take lasix as needed for worsening leg/ankle swelling, or shortness of breath  Labwork:  No new labs needed  Testing/Procedures:  No further testing at this time   I recommend watching educational videos on topics of interest to you at:       [email or web address]  Enter code: [email or web address]    Follow-Up: It was a pleasure seeing you in the office today. Please call us if you have new issues that need to be addressed before your next appt.  [phone]  Your physician wants you to follow-up in: 6 months.  You will receive a reminder letter in the mail two months in advance. If you don't receive a letter, please call our office to schedule the follow-up appointment.  If you need a refill on your cardiac medications before your next appointment, please call your pharmacy.

## 2016-05-12 NOTE — Progress Notes (Signed)
Cardiology Office Note  Date:  05/12/2016   ID:  Mia Padilla, DOB 1934-12-09, MRN [id number]  PCP:  Golden Pop, MD   Chief Complaint  Patient presents with  . other    Hx Afib pt needs to discuss medications not taking medications like she should be. Meds reviewed verbally with pt.    HPI:  [name] is a 82 year old woman with history of stroke, seizure disorder, hypertension, COPD, diabetes, hypothyroidism, obesity, atrial fibrillation on hospital admission April 04 2011 for mental status changes. During her hospital course, she had an echocardiogram showing normal LV function, carotid ultrasound showing no significant disease, head CT and MRI showing no significant stroke. She reports having a "code" called on her in the hospital. Notes indicate possible TIA. She was started on warfarin for atrial fibrillation , Stopped since that time by primary care secondary to unsteady gait and risk of falls She presents today for follow up of her atrial fibrillation.  She reports smoking for 20 years, on inhalers  In follow-up today she reports getting over upper respiratory infection She went to Urgent care around 1/15/ 2018: With symptoms of cough On 05/08/2016: She went to the emergency room for similar symptoms with Cough and SOB BNP 207, tnt 0.03 Currently On augmentin , started prednisone From the emergency room Started digoxin load in the emergency room as her dig level was low, presumably not taking the medication Breathing getting better She denies any significant chest pain Still with cough No longer taking Lasix, previously was taking this once or twice per week for leg swelling She does notice some leg edema, mild pitting in her feet  Lab work reviewed Total chol 132, LDL 64 A1C 6.4 TSH 7 Vit D 7.5, she is taking 50,000 units per week  Occasional vertigo for which she takes meclizine when necessary Lives with her son and his wife.   EKG on today's visit shows sinus  tachycardia with rate 82 bpm, no significant ST or T-wave changes, PVCs noted  Other past medical history Significant gait instability, uses a walker significant osteoarthritis of her knees. H/o mastectomy 08/03/2013 for breast cancer by Dr. Jamal Collin. No radiation treatment or chemotherapy needed, she is on Arimidex.   Coumadin was stopped in the past secondary to unsteady gait by Dr. Jeananne Rama. She was started on Plavix 10/13/2011. She denies any recent falls.  Periods of tachycardia in the past, better on metoprolol Notes indicate that she has had a history of seizures, possibly with low glucose levels. Previous diagnosis of complex partial seizures and she was started on antiseizure medications. Weaned off Keppra   PMH:   has a past medical history of Arthritis; Black head; Bowel incontinence; Breast cancer of upper-outer quadrant of left female breast ([place]) (2015); Carpal tunnel syndrome; COPD (chronic obstructive pulmonary disease) ([place]); Debilitated; Diabetes mellitus without complication ([place]); Glaucoma; History of seizures; Hyperlipidemia; Hypertension; Hypotension; Obesity; and Urinary incontinence.  PSH:    Past Surgical History:  Procedure Laterality Date  . BOWEL RESECTION  2005  . BREAST SURGERY  08/03/13   left mastectomy  . CARPAL TUNNEL RELEASE Right 30 years   . MASTECTOMY     left   . PARTIAL HYSTERECTOMY    . TONSILLECTOMY    . UMBILICAL HERNIA REPAIR      Current Outpatient Prescriptions  Medication Sig Dispense Refill  . acetaminophen (TYLENOL) 500 MG tablet Take 500 mg by mouth every 6 (six) hours as needed.    Marland Kitchen albuterol (PROVENTIL  HFA;VENTOLIN HFA) 108 (90 Base) MCG/ACT inhaler Inhale 2 puffs into the lungs every 6 (six) hours as needed for wheezing or shortness of breath. 1 Inhaler 0  . amoxicillin-clavulanate (AUGMENTIN) 875-125 MG tablet Take 1 tablet by mouth 2 (two) times daily. (Patient taking differently: Take 1 tablet by mouth 2 (two) times daily.  Started Sunday the 14th to last until Wednesday morning the 24th) 20 tablet 0  . anastrozole (ARIMIDEX) 1 MG tablet Take 1 tablet (1 mg total) by mouth daily. 30 tablet 12  . aspirin EC 81 MG tablet Take 81 mg by mouth daily.    . benzonatate (TESSALON PERLES) 100 MG capsule Take 1 capsule (100 mg total) by mouth 3 (three) times daily as needed for cough. 20 capsule 2  . blood glucose meter kit and supplies Diagnosis:E11.9 Test glucose twice daily 1 each 12  . brimonidine (ALPHAGAN P) 0.1 % SOLN 2 (two) times daily.     . canagliflozin (INVOKANA) 100 MG TABS tablet Take 1 tablet (100 mg total) by mouth daily. 90 tablet 4  . clopidogrel (PLAVIX) 75 MG tablet Take 1 tablet (75 mg total) by mouth daily. 90 tablet 4  . cyanocobalamin (,VITAMIN B-12,) 1000 MCG/ML injection Inject 1 mL (1,000 mcg total) into the muscle every 30 (thirty) days. 1 mL 12  . digoxin (LANOXIN) 0.125 MG tablet Take 1 tablet (125 mcg total) by mouth daily. 90 tablet 1  . dorzolamide (TRUSOPT) 2 % ophthalmic solution 1 drop 2 (two) times daily.     . fluticasone (FLONASE) 50 MCG/ACT nasal spray Place 2 sprays into both nostrils daily. 16 g 12  . Fluticasone-Salmeterol (ADVAIR) 250-50 MCG/DOSE AEPB Inhale 1 puff into the lungs every 12 (twelve) hours. 180 each 4  . glucose blood test strip Use as instructed 100 each 12  . LEVEMIR FLEXTOUCH 100 UNIT/ML Pen INJECT 23 TO 30 UNITS PER DAY (Patient taking differently: Inject 26 Units into the skin at bedtime. INJECT 23 TO 30 UNITS PER DAY) 15 mL 11  . levothyroxine (SYNTHROID, LEVOTHROID) 100 MCG tablet Take 1 tablet (100 mcg total) by mouth daily. 90 tablet 0  . lisinopril (PRINIVIL,ZESTRIL) 10 MG tablet Take 1 tablet (10 mg total) by mouth daily. 90 tablet 4  . meclizine (ANTIVERT) 25 MG tablet Take 1 tablet (25 mg total) by mouth 3 (three) times daily as needed. 90 tablet 1  . metFORMIN (GLUCOPHAGE) 500 MG tablet Take 1 tablet (500 mg total) by mouth 2 (two) times daily with a meal.  (Patient taking differently: Take 500 mg by mouth daily with breakfast. ) 180 tablet 4  . metoprolol succinate (TOPROL-XL) 100 MG 24 hr tablet Take 1 tablet (100 mg total) by mouth daily. 90 tablet 4  . mometasone (ELOCON) 0.1 % cream Apply 1 application topically daily. Do not use day in and day out 45 g 2  . mupirocin ointment (BACTROBAN) 2 % Apply 1 application topically 2 (two) times daily. Do not use day in and day out 22 g 2  . nystatin (MYCOSTATIN/NYSTOP) powder APPY TO THE AFFECTED AREA 4 TIMES DAILY 45 g 1  . Olopatadine HCl (PATADAY) 0.2 % SOLN Apply to eye daily.    . predniSONE (DELTASONE) 10 MG tablet 40 mg by mouth for 4 more days. 16 tablet 0  . solifenacin (VESICARE) 5 MG tablet Take 2 tablets (10 mg total) by mouth daily. 180 tablet 4  . travoprost, benzalkonium, (TRAVATAN) 0.004 % ophthalmic solution Apply 1 drop to eye  at bedtime.    . Vitamin D, Ergocalciferol, (DRISDOL) 50000 units CAPS capsule TAKE 1 CAPSULE BY MOUTH EVERY 7 DAYS. 12 capsule 0  . furosemide (LASIX) 20 MG tablet Take 1 tablet (20 mg total) by mouth daily as needed. 30 tablet 6   No current facility-administered medications for this visit.      Allergies:   Propoxyphene; Advil [ibuprofen]; Darvocet [propoxyphene n-acetaminophen]; and Percocet [oxycodone-acetaminophen]   Social History:  The patient  reports that she has quit smoking. She has never used smokeless tobacco. She reports that she does not drink alcohol or use drugs.   Family History:   family history includes Cervical cancer in her daughter and mother; Diabetes in her brother and mother; Glaucoma in her mother; HIV in her brother; Hypertension in her brother, daughter, father, sister, and son.    Review of Systems: Review of Systems  Constitutional: Negative.   Respiratory: Positive for cough and sputum production.   Cardiovascular: Positive for leg swelling.  Gastrointestinal: Negative.   Musculoskeletal: Negative.   Neurological:  Negative.   Psychiatric/Behavioral: Negative.   All other systems reviewed and are negative.    PHYSICAL EXAM: VS:  BP (!) 166/80 (BP Location: Right Arm, Patient Position: Sitting, Cuff Size: Large)   Pulse 82   Ht '5\' 6"'$  (1.676 m)   Wt 186 lb 4 oz (84.5 kg)   BMI 30.06 kg/m  , BMI Body mass index is 30.06 kg/m. GEN: Well nourished, well developed, in no acute distress  HEENT: normal  Neck: no JVD, carotid bruits, or masses Cardiac: RRR; no murmurs, rubs, or gallops,tarce nonpitting edema  Respiratory:  Decreased BS moderately throughout, normal work of breathing GI: soft, nontender, nondistended, + BS MS: no deformity or atrophy  Skin: warm and dry, no rash Neuro:  Strength and sensation are intact Psych: euthymic mood, full affect    Recent Labs: 02/11/2016: TSH 7.080 05/08/2016: ALT 20; B Natriuretic Peptide 207.0; BUN 15; Creatinine, Ser 0.75; Hemoglobin 12.6; Platelets 216; Potassium 4.3; Sodium 136    Lipid Panel Lab Results  Component Value Date   CHOL 132 11/11/2015   HDL 46 11/11/2015   LDLCALC 64 11/11/2015   TRIG 112 11/11/2015      Wt Readings from Last 3 Encounters:  05/12/16 186 lb 4 oz (84.5 kg)  05/08/16 195 lb (88.5 kg)  02/14/16 199 lb (90.3 kg)       ASSESSMENT AND PLAN:  Paroxysmal atrial fibrillation (HCC) - Plan: EKG 12-Lead Maintaining normal sinus rhythm on today's visit Not on anticoagulation, high fall risk, stopped by primary care  Essential hypertension Blood pressure elevated on today's visit. She reports blood pressure is up and down, monitors this at home. Suggested she take Lasix as needed for leg swelling and for hypertension, one or 2 times per week  Centrilobular emphysema ([place]) Recent COPD exacerbation Treated with Augmentin, prednisone. Hospital records reviewed with her in detail  Type 2 diabetes mellitus with diabetic peripheral angiopathy without gangrene, without long-term current use of insulin (HCC) Unable to  exercise. Recommended low carbohydrate diet  Chronic diastolic congestive heart failure ([place]) We'll restart Lasix for her to take as needed for ankle swelling, shortness of breath   new prescription sent in Normal renal function She does have incontinence   Total encounter time more than 25 minutes  Greater than 50% was spent in counseling and coordination of care with the patient   Disposition:   F/U  6 months   Orders Placed This  Encounter  Procedures  . EKG 12-Lead     Signed, Esmond Plants, M.D., Ph.D. 05/12/2016  [place], [place]

## 2016-05-13 DIAGNOSIS — E119 Type 2 diabetes mellitus without complications: Secondary | ICD-10-CM | POA: Diagnosis not present

## 2016-05-13 DIAGNOSIS — I951 Orthostatic hypotension: Secondary | ICD-10-CM | POA: Diagnosis not present

## 2016-05-13 DIAGNOSIS — I4891 Unspecified atrial fibrillation: Secondary | ICD-10-CM | POA: Diagnosis not present

## 2016-05-13 DIAGNOSIS — D510 Vitamin B12 deficiency anemia due to intrinsic factor deficiency: Secondary | ICD-10-CM | POA: Diagnosis not present

## 2016-05-15 DIAGNOSIS — I951 Orthostatic hypotension: Secondary | ICD-10-CM | POA: Diagnosis not present

## 2016-05-15 DIAGNOSIS — D510 Vitamin B12 deficiency anemia due to intrinsic factor deficiency: Secondary | ICD-10-CM | POA: Diagnosis not present

## 2016-05-15 DIAGNOSIS — I4891 Unspecified atrial fibrillation: Secondary | ICD-10-CM | POA: Diagnosis not present

## 2016-05-15 DIAGNOSIS — E119 Type 2 diabetes mellitus without complications: Secondary | ICD-10-CM | POA: Diagnosis not present

## 2016-05-18 ENCOUNTER — Ambulatory Visit (INDEPENDENT_AMBULATORY_CARE_PROVIDER_SITE_OTHER): Payer: Medicare Other | Admitting: Family Medicine

## 2016-05-18 ENCOUNTER — Encounter: Payer: Self-pay | Admitting: Family Medicine

## 2016-05-18 VITALS — BP 148/80 | HR 85 | Temp 98.8°F | Ht 66.0 in | Wt 191.0 lb

## 2016-05-18 DIAGNOSIS — I5032 Chronic diastolic (congestive) heart failure: Secondary | ICD-10-CM | POA: Diagnosis not present

## 2016-05-18 DIAGNOSIS — I480 Paroxysmal atrial fibrillation: Secondary | ICD-10-CM | POA: Diagnosis not present

## 2016-05-18 DIAGNOSIS — E1151 Type 2 diabetes mellitus with diabetic peripheral angiopathy without gangrene: Secondary | ICD-10-CM | POA: Diagnosis not present

## 2016-05-18 DIAGNOSIS — Z8673 Personal history of transient ischemic attack (TIA), and cerebral infarction without residual deficits: Secondary | ICD-10-CM

## 2016-05-18 DIAGNOSIS — I10 Essential (primary) hypertension: Secondary | ICD-10-CM | POA: Diagnosis not present

## 2016-05-18 DIAGNOSIS — Z13220 Encounter for screening for lipoid disorders: Secondary | ICD-10-CM | POA: Diagnosis not present

## 2016-05-18 LAB — LP+ALT+AST PICCOLO, WAIVED
ALT (SGPT) PICCOLO, WAIVED: 17 U/L (ref 10–47)
AST (SGOT) Piccolo, Waived: 31 U/L (ref 11–38)
Chol/HDL Ratio Piccolo,Waive: 2.8 mg/dL
Cholesterol Piccolo, Waived: 122 mg/dL (ref <200)
HDL CHOL PICCOLO, WAIVED: 44 mg/dL — AB (ref >59)
LDL CHOL CALC PICCOLO WAIVED: 59 mg/dL (ref <100)
Triglycerides Piccolo,Waived: 95 mg/dL (ref <150)
VLDL Chol Calc Piccolo,Waive: 19 mg/dL (ref <30)

## 2016-05-18 LAB — BAYER DCA HB A1C WAIVED: HB A1C (BAYER DCA - WAIVED): 6.5 % (ref <7.0)

## 2016-05-18 MED ORDER — BENZONATATE 100 MG PO CAPS: 100.0000 mg | ORAL_CAPSULE | Freq: Three times a day (TID) | ORAL | 2 refills | Status: DC | PRN

## 2016-05-18 MED ORDER — MECLIZINE HCL 25 MG PO TABS: 25.0000 mg | ORAL_TABLET | Freq: Three times a day (TID) | ORAL | 1 refills | Status: DC | PRN

## 2016-05-18 NOTE — Assessment & Plan Note (Signed)
The current medical regimen is effective;  continue present plan and medications.  

## 2016-05-18 NOTE — Progress Notes (Signed)
BP (!) 148/80 (BP Location: Left Arm)   Pulse 85   Temp 98.8 F (37.1 C) (Oral)   Ht 5\' 6"  (1.676 m)   Wt 191 lb (86.6 kg)   BMI 30.83 kg/m    Subjective:    Patient ID: Mia Padilla, female    DOB: May 31, 1934, 82 y.o.   MRN: [id number]  HPI: Mia Padilla is a 82 y.o. female  Chief Complaint  Patient presents with  . Follow-up  . Hypertension  . Diabetes  Patient follow-up congestive heart failure etiology placed on Lasix 1-2 a week patient's taken a couple which seemed to make a big difference. Patient breathing better and ankles are smaller. Reviewed patient's medication and stable. Diabetes noted low blood sugar spells doing okay with insulin and medications. Breathing doing okay still coughing some wants more Tessalon Perles.  Relevant past medical, surgical, family and social history reviewed and updated as indicated. Interim medical history since our last visit reviewed. Allergies and medications reviewed and updated.  Review of Systems  Constitutional: Positive for fatigue. Negative for fever.  Respiratory: Positive for cough. Negative for apnea, choking and chest tightness.   Cardiovascular: Negative.     Per HPI unless specifically indicated above     Objective:    BP (!) 148/80 (BP Location: Left Arm)   Pulse 85   Temp 98.8 F (37.1 C) (Oral)   Ht 5\' 6"  (1.676 m)   Wt 191 lb (86.6 kg)   BMI 30.83 kg/m   Wt Readings from Last 3 Encounters:  05/18/16 191 lb (86.6 kg)  05/12/16 186 lb 4 oz (84.5 kg)  05/08/16 195 lb (88.5 kg)    Physical Exam  Constitutional: She is oriented to person, place, and time. She appears well-developed and well-nourished. No distress.  HENT:  Head: Normocephalic and atraumatic.  Right Ear: Hearing normal.  Left Ear: Hearing normal.  Nose: Nose normal.  Eyes: Conjunctivae and lids are normal. Right eye exhibits no discharge. Left eye exhibits no discharge. No scleral icterus.  Cardiovascular: Normal rate and normal heart  sounds.   Pulmonary/Chest: Effort normal and breath sounds normal. No respiratory distress.  Musculoskeletal: Normal range of motion.  Neurological: She is alert and oriented to person, place, and time.  Skin: Skin is intact. No rash noted.  Psychiatric: She has a normal mood and affect. Her speech is normal and behavior is normal. Judgment and thought content normal. Cognition and memory are normal.    Results for orders placed or performed during the hospital encounter of 05/08/16  CBC with Differential  Result Value Ref Range   WBC 3.4 (L) 3.6 - 11.0 K/uL   RBC 4.18 3.80 - 5.20 MIL/uL   Hemoglobin 12.6 12.0 - 16.0 g/dL   HCT 38.1 35.0 - 47.0 %   MCV 91.2 80.0 - 100.0 fL   MCH 30.1 26.0 - 34.0 pg   MCHC 33.0 32.0 - 36.0 g/dL   RDW 15.1 (H) 11.5 - 14.5 %   Platelets 216 150 - 440 K/uL   Neutrophils Relative % 68 %   Neutro Abs 2.3 1.4 - 6.5 K/uL   Lymphocytes Relative 20 %   Lymphs Abs 0.7 (L) 1.0 - 3.6 K/uL   Monocytes Relative 10 %   Monocytes Absolute 0.3 0.2 - 0.9 K/uL   Eosinophils Relative 2 %   Eosinophils Absolute 0.1 0 - 0.7 K/uL   Basophils Relative 0 %   Basophils Absolute 0.0 0 - 0.1 K/uL  Troponin I  Result Value Ref Range   Troponin I 0.03 (HH) <0.03 ng/mL  Comprehensive metabolic panel  Result Value Ref Range   Sodium 136 135 - 145 mmol/L   Potassium 4.3 3.5 - 5.1 mmol/L   Chloride 99 (L) 101 - 111 mmol/L   CO2 31 22 - 32 mmol/L   Glucose, Bld 131 (H) 65 - 99 mg/dL   BUN 15 6 - 20 mg/dL   Creatinine, Ser 0.75 0.44 - 1.00 mg/dL   Calcium 8.8 (L) 8.9 - 10.3 mg/dL   Total Protein 8.6 (H) 6.5 - 8.1 g/dL   Albumin 3.4 (L) 3.5 - 5.0 g/dL   AST 45 (H) 15 - 41 U/L   ALT 20 14 - 54 U/L   Alkaline Phosphatase 45 38 - 126 U/L   Total Bilirubin 1.0 0.3 - 1.2 mg/dL   GFR calc non Af Amer >60 >60 mL/min   GFR calc Af Amer >60 >60 mL/min   Anion gap 6 5 - 15  Brain natriuretic peptide  Result Value Ref Range   B Natriuretic Peptide 207.0 (H) 0.0 - 100.0 pg/mL    Digoxin level  Result Value Ref Range   Digoxin Level <0.2 (L) 0.8 - 2.0 ng/mL  Troponin I  Result Value Ref Range   Troponin I 0.03 (HH) <0.03 ng/mL      Assessment & Plan:   Problem List Items Addressed This Visit      Cardiovascular and Mediastinum   A-fib ([place])    Stable for now Reviewed cardiology notes      Hypertension    The current medical regimen is effective;  continue present plan and medications.       Relevant Orders   LP+ALT+AST Piccolo, Waived   Bayer DCA Hb A1c Waived (STAT)   Basic Metabolic Panel (BMET)   Diastolic CHF ([place])    The current medical regimen is effective;  continue present plan and medications.        Endocrine   Diabetes mellitus ([place]) - Primary    The current medical regimen is effective;  continue present plan and medications.       Relevant Orders   LP+ALT+AST Piccolo, Waived   Bayer DCA Hb A1c Waived (STAT)   Basic Metabolic Panel (BMET)     Other   History of CVA (cerebrovascular accident)   Relevant Orders   LP+ALT+AST Piccolo, Waived   Basic Metabolic Panel (BMET)    Other Visit Diagnoses    Screening cholesterol level       Relevant Orders   LP+ALT+AST Piccolo, Waived       Follow up plan: Return in about 3 months (around 08/16/2016) for Physical Exam, Hemoglobin A1c.

## 2016-05-18 NOTE — Assessment & Plan Note (Addendum)
Stable for now Reviewed cardiology notes

## 2016-05-19 ENCOUNTER — Encounter: Payer: Self-pay | Admitting: Family Medicine

## 2016-05-19 DIAGNOSIS — D510 Vitamin B12 deficiency anemia due to intrinsic factor deficiency: Secondary | ICD-10-CM | POA: Diagnosis not present

## 2016-05-19 DIAGNOSIS — I951 Orthostatic hypotension: Secondary | ICD-10-CM | POA: Diagnosis not present

## 2016-05-19 DIAGNOSIS — E119 Type 2 diabetes mellitus without complications: Secondary | ICD-10-CM | POA: Diagnosis not present

## 2016-05-19 DIAGNOSIS — I4891 Unspecified atrial fibrillation: Secondary | ICD-10-CM | POA: Diagnosis not present

## 2016-05-19 LAB — BASIC METABOLIC PANEL
BUN / CREAT RATIO: 29 — AB (ref 12–28)
BUN: 23 mg/dL (ref 8–27)
CO2: 26 mmol/L (ref 18–29)
Calcium: 9.1 mg/dL (ref 8.7–10.3)
Chloride: 99 mmol/L (ref 96–106)
Creatinine, Ser: 0.79 mg/dL (ref 0.57–1.00)
GFR calc Af Amer: 82 mL/min/{1.73_m2} (ref >59)
GFR, EST NON AFRICAN AMERICAN: 71 mL/min/{1.73_m2} (ref >59)
GLUCOSE: 85 mg/dL (ref 65–99)
POTASSIUM: 4.4 mmol/L (ref 3.5–5.2)
SODIUM: 140 mmol/L (ref 134–144)

## 2016-05-20 DIAGNOSIS — D510 Vitamin B12 deficiency anemia due to intrinsic factor deficiency: Secondary | ICD-10-CM | POA: Diagnosis not present

## 2016-05-20 DIAGNOSIS — I4891 Unspecified atrial fibrillation: Secondary | ICD-10-CM | POA: Diagnosis not present

## 2016-05-20 DIAGNOSIS — E119 Type 2 diabetes mellitus without complications: Secondary | ICD-10-CM | POA: Diagnosis not present

## 2016-05-20 DIAGNOSIS — I951 Orthostatic hypotension: Secondary | ICD-10-CM | POA: Diagnosis not present

## 2016-05-22 DIAGNOSIS — I951 Orthostatic hypotension: Secondary | ICD-10-CM | POA: Diagnosis not present

## 2016-05-22 DIAGNOSIS — E119 Type 2 diabetes mellitus without complications: Secondary | ICD-10-CM | POA: Diagnosis not present

## 2016-05-22 DIAGNOSIS — I4891 Unspecified atrial fibrillation: Secondary | ICD-10-CM | POA: Diagnosis not present

## 2016-05-22 DIAGNOSIS — D510 Vitamin B12 deficiency anemia due to intrinsic factor deficiency: Secondary | ICD-10-CM | POA: Diagnosis not present

## 2016-05-25 DIAGNOSIS — E119 Type 2 diabetes mellitus without complications: Secondary | ICD-10-CM | POA: Diagnosis not present

## 2016-05-25 DIAGNOSIS — D510 Vitamin B12 deficiency anemia due to intrinsic factor deficiency: Secondary | ICD-10-CM | POA: Diagnosis not present

## 2016-05-25 DIAGNOSIS — I951 Orthostatic hypotension: Secondary | ICD-10-CM | POA: Diagnosis not present

## 2016-05-25 DIAGNOSIS — I4891 Unspecified atrial fibrillation: Secondary | ICD-10-CM | POA: Diagnosis not present

## 2016-05-26 DIAGNOSIS — B351 Tinea unguium: Secondary | ICD-10-CM | POA: Diagnosis not present

## 2016-05-26 DIAGNOSIS — E119 Type 2 diabetes mellitus without complications: Secondary | ICD-10-CM | POA: Diagnosis not present

## 2016-05-27 DIAGNOSIS — D510 Vitamin B12 deficiency anemia due to intrinsic factor deficiency: Secondary | ICD-10-CM | POA: Diagnosis not present

## 2016-05-27 DIAGNOSIS — I4891 Unspecified atrial fibrillation: Secondary | ICD-10-CM | POA: Diagnosis not present

## 2016-05-27 DIAGNOSIS — E119 Type 2 diabetes mellitus without complications: Secondary | ICD-10-CM | POA: Diagnosis not present

## 2016-05-27 DIAGNOSIS — I951 Orthostatic hypotension: Secondary | ICD-10-CM | POA: Diagnosis not present

## 2016-05-28 DIAGNOSIS — I4891 Unspecified atrial fibrillation: Secondary | ICD-10-CM | POA: Diagnosis not present

## 2016-05-28 DIAGNOSIS — D510 Vitamin B12 deficiency anemia due to intrinsic factor deficiency: Secondary | ICD-10-CM | POA: Diagnosis not present

## 2016-05-28 DIAGNOSIS — I951 Orthostatic hypotension: Secondary | ICD-10-CM | POA: Diagnosis not present

## 2016-05-28 DIAGNOSIS — E119 Type 2 diabetes mellitus without complications: Secondary | ICD-10-CM | POA: Diagnosis not present

## 2016-06-01 ENCOUNTER — Ambulatory Visit
Admission: RE | Admit: 2016-06-01 | Discharge: 2016-06-01 | Disposition: A | Discharge status: Home / Self Care | Payer: Medicare Other | Source: Ambulatory Visit | Attending: General Surgery | Admitting: General Surgery

## 2016-06-01 DIAGNOSIS — I4891 Unspecified atrial fibrillation: Secondary | ICD-10-CM | POA: Diagnosis not present

## 2016-06-01 DIAGNOSIS — Z1231 Encounter for screening mammogram for malignant neoplasm of breast: Secondary | ICD-10-CM | POA: Insufficient documentation

## 2016-06-01 DIAGNOSIS — E119 Type 2 diabetes mellitus without complications: Secondary | ICD-10-CM | POA: Diagnosis not present

## 2016-06-01 DIAGNOSIS — D510 Vitamin B12 deficiency anemia due to intrinsic factor deficiency: Secondary | ICD-10-CM | POA: Diagnosis not present

## 2016-06-01 DIAGNOSIS — I951 Orthostatic hypotension: Secondary | ICD-10-CM | POA: Diagnosis not present

## 2016-06-03 ENCOUNTER — Encounter: Payer: Self-pay | Admitting: *Deleted

## 2016-06-03 DIAGNOSIS — I951 Orthostatic hypotension: Secondary | ICD-10-CM | POA: Diagnosis not present

## 2016-06-03 DIAGNOSIS — E119 Type 2 diabetes mellitus without complications: Secondary | ICD-10-CM | POA: Diagnosis not present

## 2016-06-03 DIAGNOSIS — I4891 Unspecified atrial fibrillation: Secondary | ICD-10-CM | POA: Diagnosis not present

## 2016-06-03 DIAGNOSIS — D510 Vitamin B12 deficiency anemia due to intrinsic factor deficiency: Secondary | ICD-10-CM | POA: Diagnosis not present

## 2016-06-05 DIAGNOSIS — E119 Type 2 diabetes mellitus without complications: Secondary | ICD-10-CM | POA: Diagnosis not present

## 2016-06-05 DIAGNOSIS — I951 Orthostatic hypotension: Secondary | ICD-10-CM | POA: Diagnosis not present

## 2016-06-05 DIAGNOSIS — D510 Vitamin B12 deficiency anemia due to intrinsic factor deficiency: Secondary | ICD-10-CM | POA: Diagnosis not present

## 2016-06-05 DIAGNOSIS — I4891 Unspecified atrial fibrillation: Secondary | ICD-10-CM | POA: Diagnosis not present

## 2016-06-08 ENCOUNTER — Encounter: Payer: Self-pay | Admitting: General Surgery

## 2016-06-08 ENCOUNTER — Ambulatory Visit (INDEPENDENT_AMBULATORY_CARE_PROVIDER_SITE_OTHER): Payer: Medicare Other | Admitting: General Surgery

## 2016-06-08 VITALS — BP 130/62 | HR 74 | Resp 14 | Ht 62.0 in | Wt 196.0 lb

## 2016-06-08 DIAGNOSIS — C50912 Malignant neoplasm of unspecified site of left female breast: Secondary | ICD-10-CM

## 2016-06-08 DIAGNOSIS — I951 Orthostatic hypotension: Secondary | ICD-10-CM | POA: Diagnosis not present

## 2016-06-08 DIAGNOSIS — Z170 Estrogen receptor positive status [ER+]: Secondary | ICD-10-CM | POA: Diagnosis not present

## 2016-06-08 DIAGNOSIS — E119 Type 2 diabetes mellitus without complications: Secondary | ICD-10-CM | POA: Diagnosis not present

## 2016-06-08 DIAGNOSIS — D510 Vitamin B12 deficiency anemia due to intrinsic factor deficiency: Secondary | ICD-10-CM | POA: Diagnosis not present

## 2016-06-08 DIAGNOSIS — I4891 Unspecified atrial fibrillation: Secondary | ICD-10-CM | POA: Diagnosis not present

## 2016-06-08 MED ORDER — ANASTROZOLE 1 MG PO TABS: 1.0000 mg | ORAL_TABLET | Freq: Every day | ORAL | 4 refills | Status: DC

## 2016-06-08 NOTE — Patient Instructions (Signed)
,   Patient to return in one year right breast mammogram with Dr. Bary Castilla.

## 2016-06-08 NOTE — Progress Notes (Addendum)
Patient ID: Mia Padilla, female   DOB: 07-29-1934, 82 y.o.   MRN: [id number]  Chief Complaint  Patient presents with  . Follow-up    mammogram    HPI Mia Padilla is a 82 y.o. female who presents for a breast cancer follow up. The most recent right breast mammogram was done on 06/01/2016.  Patient does perform regular self breast checks and gets regular mammograms done.    HPI  Past Medical History:  Diagnosis Date  . Arthritis    hands  . Black head   . Bowel incontinence   . Breast cancer of upper-outer quadrant of left female breast ([place]) 2015   Left  . Carpal tunnel syndrome   . COPD (chronic obstructive pulmonary disease) ([place])   . Debilitated   . Diabetes mellitus without complication ([place])   . Glaucoma   . History of seizures   . Hyperlipidemia   . Hypertension   . Hypotension   . Obesity   . Urinary incontinence     Past Surgical History:  Procedure Laterality Date  . BOWEL RESECTION  2005  . BREAST SURGERY  08/03/13   left mastectomy  . CARPAL TUNNEL RELEASE Right 30 years   . MASTECTOMY     left   . PARTIAL HYSTERECTOMY    . TONSILLECTOMY    . UMBILICAL HERNIA REPAIR      Family History  Problem Relation Age of Onset  . Cervical cancer Mother   . Diabetes Mother   . Glaucoma Mother   . Cervical cancer Daughter   . Hypertension Daughter   . Hypertension Father   . Hypertension Sister   . Hypertension Brother   . Hypertension Son   . Diabetes Brother   . HIV Brother   . Breast cancer Neg Hx     Social History Social History  Substance Use Topics  . Smoking status: Former Research scientist (life sciences)  . Smokeless tobacco: Never Used     Comment: Quit 1976  . Alcohol use No    Allergies  Allergen Reactions  . Propoxyphene Other (See Comments)    Other Reaction: Severe Headache  . Advil [Ibuprofen] Other (See Comments)    Makes her heart race  . Darvocet [Propoxyphene N-Acetaminophen]   . Percocet [Oxycodone-Acetaminophen]     hallucination    Current  Outpatient Prescriptions  Medication Sig Dispense Refill  . acetaminophen (TYLENOL) 500 MG tablet Take 500 mg by mouth every 6 (six) hours as needed.    Marland Kitchen albuterol (PROVENTIL HFA;VENTOLIN HFA) 108 (90 Base) MCG/ACT inhaler Inhale 2 puffs into the lungs every 6 (six) hours as needed for wheezing or shortness of breath. 1 Inhaler 0  . anastrozole (ARIMIDEX) 1 MG tablet Take 1 tablet (1 mg total) by mouth daily. 90 tablet 4  . aspirin EC 81 MG tablet Take 81 mg by mouth daily.    . benzonatate (TESSALON PERLES) 100 MG capsule Take 1 capsule (100 mg total) by mouth 3 (three) times daily as needed for cough. 20 capsule 2  . blood glucose meter kit and supplies Diagnosis:E11.9 Test glucose twice daily 1 each 12  . brimonidine (ALPHAGAN P) 0.1 % SOLN 2 (two) times daily.     . clopidogrel (PLAVIX) 75 MG tablet Take 1 tablet (75 mg total) by mouth daily. 90 tablet 4  . cyanocobalamin (,VITAMIN B-12,) 1000 MCG/ML injection Inject 1 mL (1,000 mcg total) into the muscle every 30 (thirty) days. 1 mL 12  . digoxin (LANOXIN) 0.125  MG tablet Take 1 tablet (125 mcg total) by mouth daily. 90 tablet 1  . dorzolamide (TRUSOPT) 2 % ophthalmic solution 1 drop 2 (two) times daily.     . fluticasone (FLONASE) 50 MCG/ACT nasal spray Place 2 sprays into both nostrils daily. 16 g 12  . Fluticasone-Salmeterol (ADVAIR) 250-50 MCG/DOSE AEPB Inhale 1 puff into the lungs every 12 (twelve) hours. 180 each 4  . furosemide (LASIX) 20 MG tablet Take 1 tablet (20 mg total) by mouth daily as needed. 30 tablet 6  . glucose blood test strip Use as instructed 100 each 12  . LEVEMIR FLEXTOUCH 100 UNIT/ML Pen INJECT 23 TO 30 UNITS PER DAY (Patient taking differently: Inject 26 Units into the skin at bedtime. INJECT 23 TO 30 UNITS PER DAY) 15 mL 11  . levothyroxine (SYNTHROID, LEVOTHROID) 100 MCG tablet Take 1 tablet (100 mcg total) by mouth daily. 90 tablet 0  . lisinopril (PRINIVIL,ZESTRIL) 10 MG tablet Take 1 tablet (10 mg total) by  mouth daily. 90 tablet 4  . meclizine (ANTIVERT) 25 MG tablet Take 1 tablet (25 mg total) by mouth 3 (three) times daily as needed. 90 tablet 1  . metFORMIN (GLUCOPHAGE) 500 MG tablet Take 1 tablet (500 mg total) by mouth 2 (two) times daily with a meal. (Patient taking differently: Take 500 mg by mouth daily with breakfast. ) 180 tablet 4  . metoprolol succinate (TOPROL-XL) 100 MG 24 hr tablet Take 1 tablet (100 mg total) by mouth daily. 90 tablet 4  . mometasone (ELOCON) 0.1 % cream Apply 1 application topically daily. Do not use day in and day out 45 g 2  . mupirocin ointment (BACTROBAN) 2 % Apply 1 application topically 2 (two) times daily. Do not use day in and day out 22 g 2  . nystatin (MYCOSTATIN/NYSTOP) powder APPY TO THE AFFECTED AREA 4 TIMES DAILY 45 g 1  . Olopatadine HCl (PATADAY) 0.2 % SOLN Apply to eye daily.    . solifenacin (VESICARE) 5 MG tablet Take 2 tablets (10 mg total) by mouth daily. 180 tablet 4  . travoprost, benzalkonium, (TRAVATAN) 0.004 % ophthalmic solution Apply 1 drop to eye at bedtime.    . Vitamin D, Ergocalciferol, (DRISDOL) 50000 units CAPS capsule TAKE 1 CAPSULE BY MOUTH EVERY 7 DAYS. 12 capsule 0   No current facility-administered medications for this visit.     Review of Systems Review of Systems  Constitutional: Negative.   Respiratory: Negative.   Cardiovascular: Negative.     Blood pressure 130/62, pulse 74, resp. rate 14, height 5\' 2"  (1.575 m), weight 196 lb (88.9 kg).  Physical Exam Physical Exam  Constitutional: She is oriented to person, place, and time. She appears well-developed and well-nourished.  Eyes: Conjunctivae are normal. No scleral icterus.  Neck: Neck supple.  Cardiovascular: Normal rate, regular rhythm and normal heart sounds.   Pulmonary/Chest: Effort normal and breath sounds normal. Right breast exhibits no inverted nipple, no mass, no nipple discharge, no skin change and no tenderness.  Abdominal: Soft. Bowel sounds are  normal. There is no tenderness.  Lymphadenopathy:    She has no cervical adenopathy.    She has no axillary adenopathy.  Neurological: She is alert and oriented to person, place, and time.  Skin: Skin is warm and dry.    Data Reviewed Mammogram reviewed   Assessment       Post left mastectomy for CA in 2015-- T1a,N0, extensive DCIS, ER/PR pos, Her 2 neg.. Currently on Arimidex  and doing well  Plan    Order CA 27-29 , Patient to return in one year right breast mammogram with Dr. Bary Castilla.      This information has been scribed by Gaspar Cola CMA.   Ashaad Gaertner G 06/30/2016, 3:31 PM

## 2016-06-09 ENCOUNTER — Encounter (INDEPENDENT_AMBULATORY_CARE_PROVIDER_SITE_OTHER): Payer: Self-pay

## 2016-06-09 ENCOUNTER — Ambulatory Visit (INDEPENDENT_AMBULATORY_CARE_PROVIDER_SITE_OTHER): Payer: Medicare Other | Admitting: Vascular Surgery

## 2016-06-09 LAB — CANCER ANTIGEN 27.29: CA 27.29: 37 U/mL (ref 0.0–38.6)

## 2016-06-10 DIAGNOSIS — I951 Orthostatic hypotension: Secondary | ICD-10-CM | POA: Diagnosis not present

## 2016-06-10 DIAGNOSIS — E119 Type 2 diabetes mellitus without complications: Secondary | ICD-10-CM | POA: Diagnosis not present

## 2016-06-10 DIAGNOSIS — I4891 Unspecified atrial fibrillation: Secondary | ICD-10-CM | POA: Diagnosis not present

## 2016-06-10 DIAGNOSIS — D510 Vitamin B12 deficiency anemia due to intrinsic factor deficiency: Secondary | ICD-10-CM | POA: Diagnosis not present

## 2016-06-12 DIAGNOSIS — I4891 Unspecified atrial fibrillation: Secondary | ICD-10-CM | POA: Diagnosis not present

## 2016-06-12 DIAGNOSIS — I951 Orthostatic hypotension: Secondary | ICD-10-CM | POA: Diagnosis not present

## 2016-06-12 DIAGNOSIS — E119 Type 2 diabetes mellitus without complications: Secondary | ICD-10-CM | POA: Diagnosis not present

## 2016-06-12 DIAGNOSIS — D510 Vitamin B12 deficiency anemia due to intrinsic factor deficiency: Secondary | ICD-10-CM | POA: Diagnosis not present

## 2016-06-16 DIAGNOSIS — I951 Orthostatic hypotension: Secondary | ICD-10-CM | POA: Diagnosis not present

## 2016-06-16 DIAGNOSIS — D510 Vitamin B12 deficiency anemia due to intrinsic factor deficiency: Secondary | ICD-10-CM | POA: Diagnosis not present

## 2016-06-16 DIAGNOSIS — E119 Type 2 diabetes mellitus without complications: Secondary | ICD-10-CM | POA: Diagnosis not present

## 2016-06-16 DIAGNOSIS — I4891 Unspecified atrial fibrillation: Secondary | ICD-10-CM | POA: Diagnosis not present

## 2016-06-17 DIAGNOSIS — I4891 Unspecified atrial fibrillation: Secondary | ICD-10-CM | POA: Diagnosis not present

## 2016-06-17 DIAGNOSIS — D510 Vitamin B12 deficiency anemia due to intrinsic factor deficiency: Secondary | ICD-10-CM | POA: Diagnosis not present

## 2016-06-17 DIAGNOSIS — E119 Type 2 diabetes mellitus without complications: Secondary | ICD-10-CM | POA: Diagnosis not present

## 2016-06-17 DIAGNOSIS — I951 Orthostatic hypotension: Secondary | ICD-10-CM | POA: Diagnosis not present

## 2016-06-18 DIAGNOSIS — I4891 Unspecified atrial fibrillation: Secondary | ICD-10-CM | POA: Diagnosis not present

## 2016-06-18 DIAGNOSIS — I951 Orthostatic hypotension: Secondary | ICD-10-CM | POA: Diagnosis not present

## 2016-06-18 DIAGNOSIS — D510 Vitamin B12 deficiency anemia due to intrinsic factor deficiency: Secondary | ICD-10-CM | POA: Diagnosis not present

## 2016-06-18 DIAGNOSIS — E119 Type 2 diabetes mellitus without complications: Secondary | ICD-10-CM | POA: Diagnosis not present

## 2016-06-23 DIAGNOSIS — I951 Orthostatic hypotension: Secondary | ICD-10-CM | POA: Diagnosis not present

## 2016-06-23 DIAGNOSIS — E119 Type 2 diabetes mellitus without complications: Secondary | ICD-10-CM | POA: Diagnosis not present

## 2016-06-23 DIAGNOSIS — I4891 Unspecified atrial fibrillation: Secondary | ICD-10-CM | POA: Diagnosis not present

## 2016-06-23 DIAGNOSIS — D510 Vitamin B12 deficiency anemia due to intrinsic factor deficiency: Secondary | ICD-10-CM | POA: Diagnosis not present

## 2016-06-24 DIAGNOSIS — E119 Type 2 diabetes mellitus without complications: Secondary | ICD-10-CM | POA: Diagnosis not present

## 2016-06-24 DIAGNOSIS — D510 Vitamin B12 deficiency anemia due to intrinsic factor deficiency: Secondary | ICD-10-CM | POA: Diagnosis not present

## 2016-06-24 DIAGNOSIS — I4891 Unspecified atrial fibrillation: Secondary | ICD-10-CM | POA: Diagnosis not present

## 2016-06-24 DIAGNOSIS — I951 Orthostatic hypotension: Secondary | ICD-10-CM | POA: Diagnosis not present

## 2016-06-26 DIAGNOSIS — I951 Orthostatic hypotension: Secondary | ICD-10-CM | POA: Diagnosis not present

## 2016-06-26 DIAGNOSIS — D510 Vitamin B12 deficiency anemia due to intrinsic factor deficiency: Secondary | ICD-10-CM | POA: Diagnosis not present

## 2016-06-26 DIAGNOSIS — E119 Type 2 diabetes mellitus without complications: Secondary | ICD-10-CM | POA: Diagnosis not present

## 2016-06-26 DIAGNOSIS — I4891 Unspecified atrial fibrillation: Secondary | ICD-10-CM | POA: Diagnosis not present

## 2016-06-29 DIAGNOSIS — I951 Orthostatic hypotension: Secondary | ICD-10-CM | POA: Diagnosis not present

## 2016-06-29 DIAGNOSIS — D510 Vitamin B12 deficiency anemia due to intrinsic factor deficiency: Secondary | ICD-10-CM | POA: Diagnosis not present

## 2016-06-29 DIAGNOSIS — E119 Type 2 diabetes mellitus without complications: Secondary | ICD-10-CM | POA: Diagnosis not present

## 2016-06-29 DIAGNOSIS — I4891 Unspecified atrial fibrillation: Secondary | ICD-10-CM | POA: Diagnosis not present

## 2016-06-30 ENCOUNTER — Telehealth: Payer: Self-pay | Admitting: Family Medicine

## 2016-06-30 DIAGNOSIS — I951 Orthostatic hypotension: Secondary | ICD-10-CM | POA: Diagnosis not present

## 2016-06-30 DIAGNOSIS — E119 Type 2 diabetes mellitus without complications: Secondary | ICD-10-CM | POA: Diagnosis not present

## 2016-06-30 DIAGNOSIS — D510 Vitamin B12 deficiency anemia due to intrinsic factor deficiency: Secondary | ICD-10-CM | POA: Diagnosis not present

## 2016-06-30 DIAGNOSIS — I4891 Unspecified atrial fibrillation: Secondary | ICD-10-CM | POA: Diagnosis not present

## 2016-06-30 NOTE — Telephone Encounter (Signed)
Mendel Ryder with [facility] would like a verbal order for re certification for 60 days of home health.

## 2016-06-30 NOTE — Telephone Encounter (Signed)
Verbal given 

## 2016-07-03 DIAGNOSIS — E119 Type 2 diabetes mellitus without complications: Secondary | ICD-10-CM | POA: Diagnosis not present

## 2016-07-03 DIAGNOSIS — D510 Vitamin B12 deficiency anemia due to intrinsic factor deficiency: Secondary | ICD-10-CM | POA: Diagnosis not present

## 2016-07-03 DIAGNOSIS — I4891 Unspecified atrial fibrillation: Secondary | ICD-10-CM | POA: Diagnosis not present

## 2016-07-03 DIAGNOSIS — I951 Orthostatic hypotension: Secondary | ICD-10-CM | POA: Diagnosis not present

## 2016-07-06 DIAGNOSIS — E119 Type 2 diabetes mellitus without complications: Secondary | ICD-10-CM | POA: Diagnosis not present

## 2016-07-06 DIAGNOSIS — I4891 Unspecified atrial fibrillation: Secondary | ICD-10-CM | POA: Diagnosis not present

## 2016-07-06 DIAGNOSIS — D510 Vitamin B12 deficiency anemia due to intrinsic factor deficiency: Secondary | ICD-10-CM | POA: Diagnosis not present

## 2016-07-06 DIAGNOSIS — I951 Orthostatic hypotension: Secondary | ICD-10-CM | POA: Diagnosis not present

## 2016-07-08 DIAGNOSIS — I951 Orthostatic hypotension: Secondary | ICD-10-CM | POA: Diagnosis not present

## 2016-07-08 DIAGNOSIS — E119 Type 2 diabetes mellitus without complications: Secondary | ICD-10-CM | POA: Diagnosis not present

## 2016-07-08 DIAGNOSIS — D510 Vitamin B12 deficiency anemia due to intrinsic factor deficiency: Secondary | ICD-10-CM | POA: Diagnosis not present

## 2016-07-08 DIAGNOSIS — I4891 Unspecified atrial fibrillation: Secondary | ICD-10-CM | POA: Diagnosis not present

## 2016-07-10 DIAGNOSIS — I951 Orthostatic hypotension: Secondary | ICD-10-CM | POA: Diagnosis not present

## 2016-07-10 DIAGNOSIS — I4891 Unspecified atrial fibrillation: Secondary | ICD-10-CM | POA: Diagnosis not present

## 2016-07-10 DIAGNOSIS — D510 Vitamin B12 deficiency anemia due to intrinsic factor deficiency: Secondary | ICD-10-CM | POA: Diagnosis not present

## 2016-07-10 DIAGNOSIS — E119 Type 2 diabetes mellitus without complications: Secondary | ICD-10-CM | POA: Diagnosis not present

## 2016-07-14 DIAGNOSIS — D510 Vitamin B12 deficiency anemia due to intrinsic factor deficiency: Secondary | ICD-10-CM | POA: Diagnosis not present

## 2016-07-14 DIAGNOSIS — E119 Type 2 diabetes mellitus without complications: Secondary | ICD-10-CM | POA: Diagnosis not present

## 2016-07-14 DIAGNOSIS — I4891 Unspecified atrial fibrillation: Secondary | ICD-10-CM | POA: Diagnosis not present

## 2016-07-14 DIAGNOSIS — I951 Orthostatic hypotension: Secondary | ICD-10-CM | POA: Diagnosis not present

## 2016-07-15 ENCOUNTER — Other Ambulatory Visit: Payer: Self-pay | Admitting: Family Medicine

## 2016-07-15 DIAGNOSIS — I951 Orthostatic hypotension: Secondary | ICD-10-CM | POA: Diagnosis not present

## 2016-07-15 DIAGNOSIS — I4891 Unspecified atrial fibrillation: Secondary | ICD-10-CM | POA: Diagnosis not present

## 2016-07-15 DIAGNOSIS — D510 Vitamin B12 deficiency anemia due to intrinsic factor deficiency: Secondary | ICD-10-CM | POA: Diagnosis not present

## 2016-07-15 DIAGNOSIS — E119 Type 2 diabetes mellitus without complications: Secondary | ICD-10-CM | POA: Diagnosis not present

## 2016-07-15 NOTE — Telephone Encounter (Signed)
Last OV: 05/18/16 Next OV: 08/18/16/  Lab Results  Component Value Date   TSH 7.080 (H) 02/11/2016    BMP Latest Ref Rng & Units 05/18/2016 05/08/2016 11/11/2015  Glucose 65 - 99 mg/dL 85 131(H) 102(H)  BUN 8 - 27 mg/dL 23 15 21   Creatinine 0.57 - 1.00 mg/dL 0.79 0.75 0.59  BUN/Creat Ratio 12 - 28 29(H) - 36(H)  Sodium 134 - 144 mmol/L 140 136 139  Potassium 3.5 - 5.2 mmol/L 4.4 4.3 4.1  Chloride 96 - 106 mmol/L 99 99(L) 100  CO2 18 - 29 mmol/L 26 31 23   Calcium 8.7 - 10.3 mg/dL 9.1 8.8(L) 8.9

## 2016-07-15 NOTE — Telephone Encounter (Signed)
Pt needs refill for levothyroxine (SYNTHROID, LEVOTHROID) 100 MCG tablet, digoxin (LANOXIN) 0.125 MG tablet, LEVEMIR FLEXTOUCH 100 UNIT/ML Pen, vesicare 10mg  and metoprolol succinate (TOPROL-XL) 100 MG 24 hr tablet sent to [facility] [address]

## 2016-07-16 MED ORDER — LEVEMIR FLEXTOUCH 100 UNIT/ML ~~LOC~~ SOPN: PEN_INJECTOR | SUBCUTANEOUS | 11 refills | Status: DC

## 2016-07-16 MED ORDER — METOPROLOL SUCCINATE ER 100 MG PO TB24: 100.0000 mg | ORAL_TABLET | Freq: Every day | ORAL | 0 refills | Status: DC

## 2016-07-16 MED ORDER — CYANOCOBALAMIN 1000 MCG/ML IJ SOLN
1000.0000 ug | INTRAMUSCULAR | 12 refills | Status: DC
Start: 2016-07-16 — End: 2017-03-26

## 2016-07-16 MED ORDER — LEVOTHYROXINE SODIUM 100 MCG PO TABS: 100.0000 ug | ORAL_TABLET | Freq: Every day | ORAL | 0 refills | Status: DC

## 2016-07-16 MED ORDER — METFORMIN HCL 500 MG PO TABS: 500.0000 mg | ORAL_TABLET | Freq: Two times a day (BID) | ORAL | 0 refills | Status: DC

## 2016-07-17 DIAGNOSIS — I4891 Unspecified atrial fibrillation: Secondary | ICD-10-CM | POA: Diagnosis not present

## 2016-07-17 DIAGNOSIS — E119 Type 2 diabetes mellitus without complications: Secondary | ICD-10-CM | POA: Diagnosis not present

## 2016-07-17 DIAGNOSIS — I951 Orthostatic hypotension: Secondary | ICD-10-CM | POA: Diagnosis not present

## 2016-07-17 DIAGNOSIS — D510 Vitamin B12 deficiency anemia due to intrinsic factor deficiency: Secondary | ICD-10-CM | POA: Diagnosis not present

## 2016-07-20 DIAGNOSIS — E119 Type 2 diabetes mellitus without complications: Secondary | ICD-10-CM | POA: Diagnosis not present

## 2016-07-20 DIAGNOSIS — I4891 Unspecified atrial fibrillation: Secondary | ICD-10-CM | POA: Diagnosis not present

## 2016-07-20 DIAGNOSIS — I951 Orthostatic hypotension: Secondary | ICD-10-CM | POA: Diagnosis not present

## 2016-07-20 DIAGNOSIS — D510 Vitamin B12 deficiency anemia due to intrinsic factor deficiency: Secondary | ICD-10-CM | POA: Diagnosis not present

## 2016-07-22 DIAGNOSIS — I4891 Unspecified atrial fibrillation: Secondary | ICD-10-CM | POA: Diagnosis not present

## 2016-07-22 DIAGNOSIS — E119 Type 2 diabetes mellitus without complications: Secondary | ICD-10-CM | POA: Diagnosis not present

## 2016-07-22 DIAGNOSIS — I951 Orthostatic hypotension: Secondary | ICD-10-CM | POA: Diagnosis not present

## 2016-07-22 DIAGNOSIS — D510 Vitamin B12 deficiency anemia due to intrinsic factor deficiency: Secondary | ICD-10-CM | POA: Diagnosis not present

## 2016-07-24 DIAGNOSIS — I4891 Unspecified atrial fibrillation: Secondary | ICD-10-CM | POA: Diagnosis not present

## 2016-07-24 DIAGNOSIS — I951 Orthostatic hypotension: Secondary | ICD-10-CM | POA: Diagnosis not present

## 2016-07-24 DIAGNOSIS — D510 Vitamin B12 deficiency anemia due to intrinsic factor deficiency: Secondary | ICD-10-CM | POA: Diagnosis not present

## 2016-07-24 DIAGNOSIS — E119 Type 2 diabetes mellitus without complications: Secondary | ICD-10-CM | POA: Diagnosis not present

## 2016-07-27 DIAGNOSIS — I4891 Unspecified atrial fibrillation: Secondary | ICD-10-CM | POA: Diagnosis not present

## 2016-07-27 DIAGNOSIS — E119 Type 2 diabetes mellitus without complications: Secondary | ICD-10-CM | POA: Diagnosis not present

## 2016-07-27 DIAGNOSIS — D510 Vitamin B12 deficiency anemia due to intrinsic factor deficiency: Secondary | ICD-10-CM | POA: Diagnosis not present

## 2016-07-27 DIAGNOSIS — I951 Orthostatic hypotension: Secondary | ICD-10-CM | POA: Diagnosis not present

## 2016-07-28 DIAGNOSIS — D510 Vitamin B12 deficiency anemia due to intrinsic factor deficiency: Secondary | ICD-10-CM | POA: Diagnosis not present

## 2016-07-28 DIAGNOSIS — E119 Type 2 diabetes mellitus without complications: Secondary | ICD-10-CM | POA: Diagnosis not present

## 2016-07-28 DIAGNOSIS — I951 Orthostatic hypotension: Secondary | ICD-10-CM | POA: Diagnosis not present

## 2016-07-28 DIAGNOSIS — I4891 Unspecified atrial fibrillation: Secondary | ICD-10-CM | POA: Diagnosis not present

## 2016-07-29 DIAGNOSIS — I4891 Unspecified atrial fibrillation: Secondary | ICD-10-CM | POA: Diagnosis not present

## 2016-07-29 DIAGNOSIS — I951 Orthostatic hypotension: Secondary | ICD-10-CM | POA: Diagnosis not present

## 2016-07-29 DIAGNOSIS — D510 Vitamin B12 deficiency anemia due to intrinsic factor deficiency: Secondary | ICD-10-CM | POA: Diagnosis not present

## 2016-07-29 DIAGNOSIS — E119 Type 2 diabetes mellitus without complications: Secondary | ICD-10-CM | POA: Diagnosis not present

## 2016-07-31 DIAGNOSIS — E119 Type 2 diabetes mellitus without complications: Secondary | ICD-10-CM | POA: Diagnosis not present

## 2016-07-31 DIAGNOSIS — I4891 Unspecified atrial fibrillation: Secondary | ICD-10-CM | POA: Diagnosis not present

## 2016-07-31 DIAGNOSIS — I951 Orthostatic hypotension: Secondary | ICD-10-CM | POA: Diagnosis not present

## 2016-07-31 DIAGNOSIS — D510 Vitamin B12 deficiency anemia due to intrinsic factor deficiency: Secondary | ICD-10-CM | POA: Diagnosis not present

## 2016-08-03 DIAGNOSIS — I4891 Unspecified atrial fibrillation: Secondary | ICD-10-CM | POA: Diagnosis not present

## 2016-08-03 DIAGNOSIS — E119 Type 2 diabetes mellitus without complications: Secondary | ICD-10-CM | POA: Diagnosis not present

## 2016-08-03 DIAGNOSIS — H2513 Age-related nuclear cataract, bilateral: Secondary | ICD-10-CM | POA: Diagnosis not present

## 2016-08-03 DIAGNOSIS — H21541 Posterior synechiae (iris), right eye: Secondary | ICD-10-CM | POA: Diagnosis not present

## 2016-08-03 DIAGNOSIS — H5703 Miosis: Secondary | ICD-10-CM | POA: Diagnosis not present

## 2016-08-03 DIAGNOSIS — I951 Orthostatic hypotension: Secondary | ICD-10-CM | POA: Diagnosis not present

## 2016-08-03 DIAGNOSIS — H401133 Primary open-angle glaucoma, bilateral, severe stage: Secondary | ICD-10-CM | POA: Diagnosis not present

## 2016-08-03 DIAGNOSIS — D510 Vitamin B12 deficiency anemia due to intrinsic factor deficiency: Secondary | ICD-10-CM | POA: Diagnosis not present

## 2016-08-05 DIAGNOSIS — I951 Orthostatic hypotension: Secondary | ICD-10-CM | POA: Diagnosis not present

## 2016-08-05 DIAGNOSIS — D510 Vitamin B12 deficiency anemia due to intrinsic factor deficiency: Secondary | ICD-10-CM | POA: Diagnosis not present

## 2016-08-05 DIAGNOSIS — E119 Type 2 diabetes mellitus without complications: Secondary | ICD-10-CM | POA: Diagnosis not present

## 2016-08-05 DIAGNOSIS — I4891 Unspecified atrial fibrillation: Secondary | ICD-10-CM | POA: Diagnosis not present

## 2016-08-07 DIAGNOSIS — I4891 Unspecified atrial fibrillation: Secondary | ICD-10-CM | POA: Diagnosis not present

## 2016-08-07 DIAGNOSIS — D510 Vitamin B12 deficiency anemia due to intrinsic factor deficiency: Secondary | ICD-10-CM | POA: Diagnosis not present

## 2016-08-07 DIAGNOSIS — I951 Orthostatic hypotension: Secondary | ICD-10-CM | POA: Diagnosis not present

## 2016-08-07 DIAGNOSIS — E119 Type 2 diabetes mellitus without complications: Secondary | ICD-10-CM | POA: Diagnosis not present

## 2016-08-10 DIAGNOSIS — E119 Type 2 diabetes mellitus without complications: Secondary | ICD-10-CM | POA: Diagnosis not present

## 2016-08-10 DIAGNOSIS — D510 Vitamin B12 deficiency anemia due to intrinsic factor deficiency: Secondary | ICD-10-CM | POA: Diagnosis not present

## 2016-08-10 DIAGNOSIS — I951 Orthostatic hypotension: Secondary | ICD-10-CM | POA: Diagnosis not present

## 2016-08-10 DIAGNOSIS — I4891 Unspecified atrial fibrillation: Secondary | ICD-10-CM | POA: Diagnosis not present

## 2016-08-12 DIAGNOSIS — D510 Vitamin B12 deficiency anemia due to intrinsic factor deficiency: Secondary | ICD-10-CM | POA: Diagnosis not present

## 2016-08-12 DIAGNOSIS — I951 Orthostatic hypotension: Secondary | ICD-10-CM | POA: Diagnosis not present

## 2016-08-12 DIAGNOSIS — I4891 Unspecified atrial fibrillation: Secondary | ICD-10-CM | POA: Diagnosis not present

## 2016-08-12 DIAGNOSIS — E119 Type 2 diabetes mellitus without complications: Secondary | ICD-10-CM | POA: Diagnosis not present

## 2016-08-14 DIAGNOSIS — I951 Orthostatic hypotension: Secondary | ICD-10-CM | POA: Diagnosis not present

## 2016-08-14 DIAGNOSIS — I4891 Unspecified atrial fibrillation: Secondary | ICD-10-CM | POA: Diagnosis not present

## 2016-08-14 DIAGNOSIS — E119 Type 2 diabetes mellitus without complications: Secondary | ICD-10-CM | POA: Diagnosis not present

## 2016-08-14 DIAGNOSIS — D510 Vitamin B12 deficiency anemia due to intrinsic factor deficiency: Secondary | ICD-10-CM | POA: Diagnosis not present

## 2016-08-17 ENCOUNTER — Ambulatory Visit: Payer: Medicare Other | Admitting: Family Medicine

## 2016-08-17 DIAGNOSIS — D510 Vitamin B12 deficiency anemia due to intrinsic factor deficiency: Secondary | ICD-10-CM | POA: Diagnosis not present

## 2016-08-17 DIAGNOSIS — E119 Type 2 diabetes mellitus without complications: Secondary | ICD-10-CM | POA: Diagnosis not present

## 2016-08-17 DIAGNOSIS — I951 Orthostatic hypotension: Secondary | ICD-10-CM | POA: Diagnosis not present

## 2016-08-17 DIAGNOSIS — I4891 Unspecified atrial fibrillation: Secondary | ICD-10-CM | POA: Diagnosis not present

## 2016-08-18 ENCOUNTER — Other Ambulatory Visit: Payer: Self-pay

## 2016-08-18 ENCOUNTER — Ambulatory Visit (INDEPENDENT_AMBULATORY_CARE_PROVIDER_SITE_OTHER): Payer: Medicare Other | Admitting: Family Medicine

## 2016-08-18 ENCOUNTER — Encounter: Payer: Self-pay | Admitting: Family Medicine

## 2016-08-18 ENCOUNTER — Encounter (INDEPENDENT_AMBULATORY_CARE_PROVIDER_SITE_OTHER): Payer: Self-pay

## 2016-08-18 VITALS — BP 132/73 | HR 85 | Wt 202.0 lb

## 2016-08-18 DIAGNOSIS — E1151 Type 2 diabetes mellitus with diabetic peripheral angiopathy without gangrene: Secondary | ICD-10-CM | POA: Diagnosis not present

## 2016-08-18 DIAGNOSIS — I10 Essential (primary) hypertension: Secondary | ICD-10-CM

## 2016-08-18 DIAGNOSIS — I739 Peripheral vascular disease, unspecified: Secondary | ICD-10-CM

## 2016-08-18 LAB — BAYER DCA HB A1C WAIVED: HB A1C (BAYER DCA - WAIVED): 6.2 % (ref <7.0)

## 2016-08-18 MED ORDER — METFORMIN HCL 500 MG PO TABS: 500.0000 mg | ORAL_TABLET | Freq: Two times a day (BID) | ORAL | 4 refills | Status: DC

## 2016-08-18 MED ORDER — SOLIFENACIN SUCCINATE 5 MG PO TABS: 10.0000 mg | ORAL_TABLET | Freq: Every day | ORAL | 4 refills | Status: DC

## 2016-08-18 MED ORDER — LISINOPRIL 10 MG PO TABS: 10.0000 mg | ORAL_TABLET | Freq: Every day | ORAL | 4 refills | Status: DC

## 2016-08-18 MED ORDER — METOPROLOL SUCCINATE ER 100 MG PO TB24: 100.0000 mg | ORAL_TABLET | Freq: Every day | ORAL | 4 refills | Status: AC

## 2016-08-18 MED ORDER — FLUTICASONE-SALMETEROL 250-50 MCG/DOSE IN AEPB: 1.0000 | INHALATION_SPRAY | Freq: Two times a day (BID) | RESPIRATORY_TRACT | 4 refills | Status: AC

## 2016-08-18 MED ORDER — CLOPIDOGREL BISULFATE 75 MG PO TABS: 75.0000 mg | ORAL_TABLET | Freq: Every day | ORAL | 4 refills | Status: DC

## 2016-08-18 MED ORDER — SOLIFENACIN SUCCINATE 10 MG PO TABS: 10.0000 mg | ORAL_TABLET | Freq: Every day | ORAL | 2 refills | Status: DC

## 2016-08-18 NOTE — Assessment & Plan Note (Signed)
The current medical regimen is effective;  continue present plan and medications.  

## 2016-08-18 NOTE — Progress Notes (Signed)
   BP 132/73   Pulse 85   Wt 202 lb (91.6 kg)   SpO2 98%   BMI 36.95 kg/m    Subjective:    Patient ID: Mia Padilla, female    DOB: 1934-11-20, 82 y.o.   MRN: [id number]  HPI: Mia Padilla is a 82 y.o. female  Chief Complaint  Patient presents with  . Follow-up  . Diabetes  . Hypertension  . Ankle Pain    Right.     Relevant past medical, surgical, family and social history reviewed and updated as indicated. Interim medical history since our last visit reviewed. Allergies and medications reviewed and updated.  Review of Systems  Constitutional: Negative.   Respiratory: Negative.   Cardiovascular: Negative.     Per HPI unless specifically indicated above     Objective:    BP 132/73   Pulse 85   Wt 202 lb (91.6 kg)   SpO2 98%   BMI 36.95 kg/m   Wt Readings from Last 3 Encounters:  08/18/16 202 lb (91.6 kg)  06/08/16 196 lb (88.9 kg)  05/18/16 191 lb (86.6 kg)    Physical Exam  Constitutional: She is oriented to person, place, and time. She appears well-developed and well-nourished.  HENT:  Head: Normocephalic and atraumatic.  Eyes: Conjunctivae and EOM are normal.  Neck: Normal range of motion.  Cardiovascular: Normal rate, regular rhythm and normal heart sounds.   Pulmonary/Chest: Effort normal and breath sounds normal.  Musculoskeletal: Normal range of motion.  Neurological: She is alert and oriented to person, place, and time.  Skin: No erythema.  Psychiatric: She has a normal mood and affect. Her behavior is normal. Judgment and thought content normal.    Results for orders placed or performed in visit on 06/08/16  Cancer antigen 27.29  Result Value Ref Range   CA 27.29 37.0 0.0 - 38.6 U/mL      Assessment & Plan:   Problem List Items Addressed This Visit      Cardiovascular and Mediastinum   Hypertension - Primary    The current medical regimen is effective;  continue present plan and medications.       Relevant Medications   lisinopril (PRINIVIL,ZESTRIL) 10 MG tablet   metoprolol succinate (TOPROL-XL) 100 MG 24 hr tablet   Other Relevant Orders   Bayer DCA Hb A1c Waived   Peripheral vascular disease ([place])    The current medical regimen is effective;  continue present plan and medications.       Relevant Medications   clopidogrel (PLAVIX) 75 MG tablet   lisinopril (PRINIVIL,ZESTRIL) 10 MG tablet   metoprolol succinate (TOPROL-XL) 100 MG 24 hr tablet     Endocrine   Diabetes mellitus ([place])    The current medical regimen is effective;  continue present plan and medications.       Relevant Medications   lisinopril (PRINIVIL,ZESTRIL) 10 MG tablet   metFORMIN (GLUCOPHAGE) 500 MG tablet   Other Relevant Orders   Bayer DCA Hb A1c Waived       Follow up plan: Return in about 3 months (around 11/18/2016) for Physical Exam, Hemoglobin A1c.

## 2016-08-18 NOTE — Telephone Encounter (Signed)
Insurance will only cover 1 tablet daily of Vesicare. t'd up 10 mg instead. Please sign if appropriate.

## 2016-08-19 DIAGNOSIS — D510 Vitamin B12 deficiency anemia due to intrinsic factor deficiency: Secondary | ICD-10-CM | POA: Diagnosis not present

## 2016-08-19 DIAGNOSIS — I951 Orthostatic hypotension: Secondary | ICD-10-CM | POA: Diagnosis not present

## 2016-08-19 DIAGNOSIS — I4891 Unspecified atrial fibrillation: Secondary | ICD-10-CM | POA: Diagnosis not present

## 2016-08-19 DIAGNOSIS — E119 Type 2 diabetes mellitus without complications: Secondary | ICD-10-CM | POA: Diagnosis not present

## 2016-08-21 DIAGNOSIS — I4891 Unspecified atrial fibrillation: Secondary | ICD-10-CM | POA: Diagnosis not present

## 2016-08-21 DIAGNOSIS — I951 Orthostatic hypotension: Secondary | ICD-10-CM | POA: Diagnosis not present

## 2016-08-21 DIAGNOSIS — D510 Vitamin B12 deficiency anemia due to intrinsic factor deficiency: Secondary | ICD-10-CM | POA: Diagnosis not present

## 2016-08-21 DIAGNOSIS — E119 Type 2 diabetes mellitus without complications: Secondary | ICD-10-CM | POA: Diagnosis not present

## 2016-08-24 ENCOUNTER — Telehealth: Payer: Self-pay | Admitting: Family Medicine

## 2016-08-24 DIAGNOSIS — I951 Orthostatic hypotension: Secondary | ICD-10-CM | POA: Diagnosis not present

## 2016-08-24 DIAGNOSIS — E119 Type 2 diabetes mellitus without complications: Secondary | ICD-10-CM | POA: Diagnosis not present

## 2016-08-24 DIAGNOSIS — I4891 Unspecified atrial fibrillation: Secondary | ICD-10-CM | POA: Diagnosis not present

## 2016-08-24 DIAGNOSIS — D510 Vitamin B12 deficiency anemia due to intrinsic factor deficiency: Secondary | ICD-10-CM | POA: Diagnosis not present

## 2016-08-24 MED ORDER — AMOXICILLIN-POT CLAVULANATE 875-125 MG PO TABS
1.0000 | ORAL_TABLET | Freq: Two times a day (BID) | ORAL | 0 refills | Status: DC
Start: 2016-08-24 — End: 2016-09-08

## 2016-08-24 NOTE — Telephone Encounter (Signed)
Patient called in regards to needing a prescription refilled for: [id number]  125MG . Patient states that medication was filled by Dr. Wynetta Emery and/or Dr. Jeananne Rama.  Patient asked if she could speak with either Dr. Wynetta Emery or Dr. Jeananne Rama at their convience this morning regarding medication refill.   Please Advise.  Thank you

## 2016-08-24 NOTE — Telephone Encounter (Signed)
-  Spoke with pt. She has Chest Cold on top of COPD. Pt requesting refill of amoxicillin Clavualnate  875-125 MG. Pt has been sick since last Wednesday. Please advise.

## 2016-08-24 NOTE — Telephone Encounter (Signed)
Message relayed to patient. Verbalized understanding and denied questions.   

## 2016-08-24 NOTE — Telephone Encounter (Signed)
Prescription sent in  

## 2016-08-25 DIAGNOSIS — B351 Tinea unguium: Secondary | ICD-10-CM | POA: Diagnosis not present

## 2016-08-25 DIAGNOSIS — E119 Type 2 diabetes mellitus without complications: Secondary | ICD-10-CM | POA: Diagnosis not present

## 2016-08-26 ENCOUNTER — Other Ambulatory Visit: Payer: Self-pay | Admitting: Family Medicine

## 2016-08-26 ENCOUNTER — Other Ambulatory Visit: Payer: Self-pay

## 2016-08-26 DIAGNOSIS — I951 Orthostatic hypotension: Secondary | ICD-10-CM | POA: Diagnosis not present

## 2016-08-26 DIAGNOSIS — D510 Vitamin B12 deficiency anemia due to intrinsic factor deficiency: Secondary | ICD-10-CM | POA: Diagnosis not present

## 2016-08-26 DIAGNOSIS — I4891 Unspecified atrial fibrillation: Secondary | ICD-10-CM | POA: Diagnosis not present

## 2016-08-26 DIAGNOSIS — E119 Type 2 diabetes mellitus without complications: Secondary | ICD-10-CM | POA: Diagnosis not present

## 2016-08-26 MED ORDER — DIGOXIN 125 MCG PO TABS: 125.0000 ug | ORAL_TABLET | Freq: Every day | ORAL | 1 refills | Status: DC

## 2016-08-26 NOTE — Telephone Encounter (Signed)
Pt needs refill on digoxin. Pt also needs new home health referral. [facility] will now be out of network, [facility] will now be in network. Ok to place referral? Please advise.   [facility] Address: [address], [place], [place], [place] [address] Phone: [phone]

## 2016-08-26 NOTE — Telephone Encounter (Signed)
Dig done and Home care referral ok

## 2016-08-26 NOTE — Telephone Encounter (Signed)
Patient is calling about a medication refill for her heart medicine. Patient stated that she could not pronounce the name of the medication. Patient also stated that she has to go to another home care agency and would like to speak to [place] about sending a letter to a  New home care agency.  Please Advise.   Thank you.

## 2016-08-26 NOTE — Telephone Encounter (Signed)
Okay to place new Home health referral? Please advise (change is due to "in network" changes by insurance)

## 2016-08-28 DIAGNOSIS — D510 Vitamin B12 deficiency anemia due to intrinsic factor deficiency: Secondary | ICD-10-CM | POA: Diagnosis not present

## 2016-08-28 DIAGNOSIS — E119 Type 2 diabetes mellitus without complications: Secondary | ICD-10-CM | POA: Diagnosis not present

## 2016-08-28 DIAGNOSIS — I951 Orthostatic hypotension: Secondary | ICD-10-CM | POA: Diagnosis not present

## 2016-08-28 DIAGNOSIS — I4891 Unspecified atrial fibrillation: Secondary | ICD-10-CM | POA: Diagnosis not present

## 2016-08-31 ENCOUNTER — Telehealth: Payer: Self-pay | Admitting: Family Medicine

## 2016-08-31 DIAGNOSIS — E119 Type 2 diabetes mellitus without complications: Secondary | ICD-10-CM | POA: Diagnosis not present

## 2016-08-31 DIAGNOSIS — D510 Vitamin B12 deficiency anemia due to intrinsic factor deficiency: Secondary | ICD-10-CM | POA: Diagnosis not present

## 2016-08-31 DIAGNOSIS — I951 Orthostatic hypotension: Secondary | ICD-10-CM | POA: Diagnosis not present

## 2016-08-31 DIAGNOSIS — I4891 Unspecified atrial fibrillation: Secondary | ICD-10-CM | POA: Diagnosis not present

## 2016-08-31 NOTE — Telephone Encounter (Signed)
Mia Padilla with [facility] needs someone to call her back today regarding a question she has about recertification.  [phone]

## 2016-08-31 NOTE — Telephone Encounter (Signed)
Verbal order given for home health aid and nursing.

## 2016-09-01 DIAGNOSIS — I4891 Unspecified atrial fibrillation: Secondary | ICD-10-CM | POA: Diagnosis not present

## 2016-09-01 DIAGNOSIS — I951 Orthostatic hypotension: Secondary | ICD-10-CM | POA: Diagnosis not present

## 2016-09-01 DIAGNOSIS — D510 Vitamin B12 deficiency anemia due to intrinsic factor deficiency: Secondary | ICD-10-CM | POA: Diagnosis not present

## 2016-09-01 DIAGNOSIS — E119 Type 2 diabetes mellitus without complications: Secondary | ICD-10-CM | POA: Diagnosis not present

## 2016-09-02 ENCOUNTER — Telehealth: Payer: Self-pay | Admitting: Cardiovascular Disease

## 2016-09-02 NOTE — Telephone Encounter (Signed)
Request for surgical clearance:  1. What type of surgery is being performed? cataract removed from eyes   2. When is this surgery scheduled? Next Friday  09/11/16  3. Are there any medications that need to be held prior to surgery and how long?       Plavix and lisinopril   4. Name of physician performing surgery? Dr Erma Pinto at [facility]   5. What is your office phone and fax number? Phone number [phone]

## 2016-09-03 DIAGNOSIS — I951 Orthostatic hypotension: Secondary | ICD-10-CM | POA: Diagnosis not present

## 2016-09-03 DIAGNOSIS — E119 Type 2 diabetes mellitus without complications: Secondary | ICD-10-CM | POA: Diagnosis not present

## 2016-09-03 DIAGNOSIS — D510 Vitamin B12 deficiency anemia due to intrinsic factor deficiency: Secondary | ICD-10-CM | POA: Diagnosis not present

## 2016-09-03 DIAGNOSIS — I4891 Unspecified atrial fibrillation: Secondary | ICD-10-CM | POA: Diagnosis not present

## 2016-09-03 NOTE — Telephone Encounter (Signed)
Typically lisinopril does not need to be stopped before eye surgery Would see abl if they are e to do the cataract surgery on Plavix given history of stroke

## 2016-09-03 NOTE — Telephone Encounter (Signed)
Spoke with patient and she states that they want to hold her plavix for 5 days before. Reviewed that she did not need to hold her lisinopril for surgery. Also advised that they do not typically hold plavix for cataract surgery and due to her previous stroke she should ask if she can stay on this as well. She verbalized understanding with no further questions at this time.

## 2016-09-04 DIAGNOSIS — E119 Type 2 diabetes mellitus without complications: Secondary | ICD-10-CM | POA: Diagnosis not present

## 2016-09-04 DIAGNOSIS — I951 Orthostatic hypotension: Secondary | ICD-10-CM | POA: Diagnosis not present

## 2016-09-04 DIAGNOSIS — I4891 Unspecified atrial fibrillation: Secondary | ICD-10-CM | POA: Diagnosis not present

## 2016-09-04 DIAGNOSIS — D510 Vitamin B12 deficiency anemia due to intrinsic factor deficiency: Secondary | ICD-10-CM | POA: Diagnosis not present

## 2016-09-07 ENCOUNTER — Telehealth: Payer: Self-pay | Admitting: Family Medicine

## 2016-09-07 DIAGNOSIS — E119 Type 2 diabetes mellitus without complications: Secondary | ICD-10-CM | POA: Diagnosis not present

## 2016-09-07 DIAGNOSIS — I4891 Unspecified atrial fibrillation: Secondary | ICD-10-CM | POA: Diagnosis not present

## 2016-09-07 DIAGNOSIS — I951 Orthostatic hypotension: Secondary | ICD-10-CM | POA: Diagnosis not present

## 2016-09-07 DIAGNOSIS — D510 Vitamin B12 deficiency anemia due to intrinsic factor deficiency: Secondary | ICD-10-CM | POA: Diagnosis not present

## 2016-09-07 NOTE — Telephone Encounter (Signed)
Noted. Will add on to noon time call list.

## 2016-09-07 NOTE — Telephone Encounter (Signed)
Patient called to speak with the provider to ask if she needs to go ahead to her cataracts surgery coming up or if she should cancel the surgery due to her bad cough. Patient states she is taking her cough medicine but is still coughing a lot. Patient wants to speak with provider as soon as he is available to know if what she should do regarding her cataracts surgery.  Please Advise.  Thank you

## 2016-09-08 ENCOUNTER — Other Ambulatory Visit: Payer: Self-pay | Admitting: Family Medicine

## 2016-09-08 NOTE — Telephone Encounter (Signed)
Transferred to provider

## 2016-09-09 DIAGNOSIS — E119 Type 2 diabetes mellitus without complications: Secondary | ICD-10-CM | POA: Diagnosis not present

## 2016-09-09 DIAGNOSIS — I4891 Unspecified atrial fibrillation: Secondary | ICD-10-CM | POA: Diagnosis not present

## 2016-09-09 DIAGNOSIS — I951 Orthostatic hypotension: Secondary | ICD-10-CM | POA: Diagnosis not present

## 2016-09-09 DIAGNOSIS — D510 Vitamin B12 deficiency anemia due to intrinsic factor deficiency: Secondary | ICD-10-CM | POA: Diagnosis not present

## 2016-09-11 DIAGNOSIS — C50911 Malignant neoplasm of unspecified site of right female breast: Secondary | ICD-10-CM | POA: Diagnosis not present

## 2016-09-11 DIAGNOSIS — H5703 Miosis: Secondary | ICD-10-CM | POA: Diagnosis not present

## 2016-09-11 DIAGNOSIS — D510 Vitamin B12 deficiency anemia due to intrinsic factor deficiency: Secondary | ICD-10-CM | POA: Diagnosis not present

## 2016-09-11 DIAGNOSIS — I5032 Chronic diastolic (congestive) heart failure: Secondary | ICD-10-CM | POA: Diagnosis not present

## 2016-09-11 DIAGNOSIS — E11319 Type 2 diabetes mellitus with unspecified diabetic retinopathy without macular edema: Secondary | ICD-10-CM | POA: Diagnosis not present

## 2016-09-11 DIAGNOSIS — E119 Type 2 diabetes mellitus without complications: Secondary | ICD-10-CM | POA: Diagnosis not present

## 2016-09-11 DIAGNOSIS — H21541 Posterior synechiae (iris), right eye: Secondary | ICD-10-CM | POA: Diagnosis not present

## 2016-09-11 DIAGNOSIS — E1140 Type 2 diabetes mellitus with diabetic neuropathy, unspecified: Secondary | ICD-10-CM | POA: Diagnosis not present

## 2016-09-11 DIAGNOSIS — I110 Hypertensive heart disease with heart failure: Secondary | ICD-10-CM | POA: Diagnosis not present

## 2016-09-11 DIAGNOSIS — E039 Hypothyroidism, unspecified: Secondary | ICD-10-CM | POA: Diagnosis not present

## 2016-09-11 DIAGNOSIS — H2511 Age-related nuclear cataract, right eye: Secondary | ICD-10-CM | POA: Diagnosis not present

## 2016-09-11 DIAGNOSIS — H401113 Primary open-angle glaucoma, right eye, severe stage: Secondary | ICD-10-CM | POA: Diagnosis not present

## 2016-09-11 DIAGNOSIS — I4891 Unspecified atrial fibrillation: Secondary | ICD-10-CM | POA: Diagnosis not present

## 2016-09-11 DIAGNOSIS — I951 Orthostatic hypotension: Secondary | ICD-10-CM | POA: Diagnosis not present

## 2016-09-14 DIAGNOSIS — E119 Type 2 diabetes mellitus without complications: Secondary | ICD-10-CM | POA: Diagnosis not present

## 2016-09-14 DIAGNOSIS — I951 Orthostatic hypotension: Secondary | ICD-10-CM | POA: Diagnosis not present

## 2016-09-14 DIAGNOSIS — D510 Vitamin B12 deficiency anemia due to intrinsic factor deficiency: Secondary | ICD-10-CM | POA: Diagnosis not present

## 2016-09-14 DIAGNOSIS — I4891 Unspecified atrial fibrillation: Secondary | ICD-10-CM | POA: Diagnosis not present

## 2016-09-15 DIAGNOSIS — I951 Orthostatic hypotension: Secondary | ICD-10-CM | POA: Diagnosis not present

## 2016-09-15 DIAGNOSIS — E119 Type 2 diabetes mellitus without complications: Secondary | ICD-10-CM | POA: Diagnosis not present

## 2016-09-15 DIAGNOSIS — D510 Vitamin B12 deficiency anemia due to intrinsic factor deficiency: Secondary | ICD-10-CM | POA: Diagnosis not present

## 2016-09-15 DIAGNOSIS — I4891 Unspecified atrial fibrillation: Secondary | ICD-10-CM | POA: Diagnosis not present

## 2016-09-16 DIAGNOSIS — E119 Type 2 diabetes mellitus without complications: Secondary | ICD-10-CM | POA: Diagnosis not present

## 2016-09-16 DIAGNOSIS — I951 Orthostatic hypotension: Secondary | ICD-10-CM | POA: Diagnosis not present

## 2016-09-16 DIAGNOSIS — D510 Vitamin B12 deficiency anemia due to intrinsic factor deficiency: Secondary | ICD-10-CM | POA: Diagnosis not present

## 2016-09-16 DIAGNOSIS — I4891 Unspecified atrial fibrillation: Secondary | ICD-10-CM | POA: Diagnosis not present

## 2016-09-18 DIAGNOSIS — I951 Orthostatic hypotension: Secondary | ICD-10-CM | POA: Diagnosis not present

## 2016-09-18 DIAGNOSIS — I4891 Unspecified atrial fibrillation: Secondary | ICD-10-CM | POA: Diagnosis not present

## 2016-09-18 DIAGNOSIS — E119 Type 2 diabetes mellitus without complications: Secondary | ICD-10-CM | POA: Diagnosis not present

## 2016-09-18 DIAGNOSIS — D510 Vitamin B12 deficiency anemia due to intrinsic factor deficiency: Secondary | ICD-10-CM | POA: Diagnosis not present

## 2016-09-21 DIAGNOSIS — D510 Vitamin B12 deficiency anemia due to intrinsic factor deficiency: Secondary | ICD-10-CM | POA: Diagnosis not present

## 2016-09-21 DIAGNOSIS — E119 Type 2 diabetes mellitus without complications: Secondary | ICD-10-CM | POA: Diagnosis not present

## 2016-09-21 DIAGNOSIS — I951 Orthostatic hypotension: Secondary | ICD-10-CM | POA: Diagnosis not present

## 2016-09-21 DIAGNOSIS — I4891 Unspecified atrial fibrillation: Secondary | ICD-10-CM | POA: Diagnosis not present

## 2016-09-21 NOTE — Telephone Encounter (Signed)
Provider spoke with patient

## 2016-09-23 DIAGNOSIS — E119 Type 2 diabetes mellitus without complications: Secondary | ICD-10-CM | POA: Diagnosis not present

## 2016-09-23 DIAGNOSIS — D510 Vitamin B12 deficiency anemia due to intrinsic factor deficiency: Secondary | ICD-10-CM | POA: Diagnosis not present

## 2016-09-23 DIAGNOSIS — I4891 Unspecified atrial fibrillation: Secondary | ICD-10-CM | POA: Diagnosis not present

## 2016-09-23 DIAGNOSIS — I951 Orthostatic hypotension: Secondary | ICD-10-CM | POA: Diagnosis not present

## 2016-09-25 ENCOUNTER — Ambulatory Visit (INDEPENDENT_AMBULATORY_CARE_PROVIDER_SITE_OTHER): Payer: Medicare Other | Admitting: Vascular Surgery

## 2016-09-25 ENCOUNTER — Encounter (INDEPENDENT_AMBULATORY_CARE_PROVIDER_SITE_OTHER): Payer: Medicare Other

## 2016-09-25 DIAGNOSIS — E119 Type 2 diabetes mellitus without complications: Secondary | ICD-10-CM | POA: Diagnosis not present

## 2016-09-25 DIAGNOSIS — D510 Vitamin B12 deficiency anemia due to intrinsic factor deficiency: Secondary | ICD-10-CM | POA: Diagnosis not present

## 2016-09-25 DIAGNOSIS — I4891 Unspecified atrial fibrillation: Secondary | ICD-10-CM | POA: Diagnosis not present

## 2016-09-25 DIAGNOSIS — I951 Orthostatic hypotension: Secondary | ICD-10-CM | POA: Diagnosis not present

## 2016-09-28 DIAGNOSIS — I4891 Unspecified atrial fibrillation: Secondary | ICD-10-CM | POA: Diagnosis not present

## 2016-09-28 DIAGNOSIS — E119 Type 2 diabetes mellitus without complications: Secondary | ICD-10-CM | POA: Diagnosis not present

## 2016-09-28 DIAGNOSIS — D510 Vitamin B12 deficiency anemia due to intrinsic factor deficiency: Secondary | ICD-10-CM | POA: Diagnosis not present

## 2016-09-28 DIAGNOSIS — I951 Orthostatic hypotension: Secondary | ICD-10-CM | POA: Diagnosis not present

## 2016-09-30 DIAGNOSIS — E119 Type 2 diabetes mellitus without complications: Secondary | ICD-10-CM | POA: Diagnosis not present

## 2016-09-30 DIAGNOSIS — I951 Orthostatic hypotension: Secondary | ICD-10-CM | POA: Diagnosis not present

## 2016-09-30 DIAGNOSIS — I4891 Unspecified atrial fibrillation: Secondary | ICD-10-CM | POA: Diagnosis not present

## 2016-09-30 DIAGNOSIS — D510 Vitamin B12 deficiency anemia due to intrinsic factor deficiency: Secondary | ICD-10-CM | POA: Diagnosis not present

## 2016-10-02 ENCOUNTER — Encounter: Payer: Self-pay | Admitting: Family Medicine

## 2016-10-02 DIAGNOSIS — E119 Type 2 diabetes mellitus without complications: Secondary | ICD-10-CM | POA: Diagnosis not present

## 2016-10-02 DIAGNOSIS — I4891 Unspecified atrial fibrillation: Secondary | ICD-10-CM | POA: Diagnosis not present

## 2016-10-02 DIAGNOSIS — D510 Vitamin B12 deficiency anemia due to intrinsic factor deficiency: Secondary | ICD-10-CM | POA: Diagnosis not present

## 2016-10-02 DIAGNOSIS — I951 Orthostatic hypotension: Secondary | ICD-10-CM | POA: Diagnosis not present

## 2016-10-05 DIAGNOSIS — E119 Type 2 diabetes mellitus without complications: Secondary | ICD-10-CM | POA: Diagnosis not present

## 2016-10-05 DIAGNOSIS — D510 Vitamin B12 deficiency anemia due to intrinsic factor deficiency: Secondary | ICD-10-CM | POA: Diagnosis not present

## 2016-10-05 DIAGNOSIS — I4891 Unspecified atrial fibrillation: Secondary | ICD-10-CM | POA: Diagnosis not present

## 2016-10-05 DIAGNOSIS — I951 Orthostatic hypotension: Secondary | ICD-10-CM | POA: Diagnosis not present

## 2016-10-07 DIAGNOSIS — I4891 Unspecified atrial fibrillation: Secondary | ICD-10-CM | POA: Diagnosis not present

## 2016-10-07 DIAGNOSIS — D510 Vitamin B12 deficiency anemia due to intrinsic factor deficiency: Secondary | ICD-10-CM | POA: Diagnosis not present

## 2016-10-07 DIAGNOSIS — E119 Type 2 diabetes mellitus without complications: Secondary | ICD-10-CM | POA: Diagnosis not present

## 2016-10-07 DIAGNOSIS — I951 Orthostatic hypotension: Secondary | ICD-10-CM | POA: Diagnosis not present

## 2016-10-09 DIAGNOSIS — I951 Orthostatic hypotension: Secondary | ICD-10-CM | POA: Diagnosis not present

## 2016-10-09 DIAGNOSIS — D510 Vitamin B12 deficiency anemia due to intrinsic factor deficiency: Secondary | ICD-10-CM | POA: Diagnosis not present

## 2016-10-09 DIAGNOSIS — E119 Type 2 diabetes mellitus without complications: Secondary | ICD-10-CM | POA: Diagnosis not present

## 2016-10-09 DIAGNOSIS — I4891 Unspecified atrial fibrillation: Secondary | ICD-10-CM | POA: Diagnosis not present

## 2016-10-12 DIAGNOSIS — I4891 Unspecified atrial fibrillation: Secondary | ICD-10-CM | POA: Diagnosis not present

## 2016-10-12 DIAGNOSIS — D510 Vitamin B12 deficiency anemia due to intrinsic factor deficiency: Secondary | ICD-10-CM | POA: Diagnosis not present

## 2016-10-12 DIAGNOSIS — I951 Orthostatic hypotension: Secondary | ICD-10-CM | POA: Diagnosis not present

## 2016-10-12 DIAGNOSIS — E119 Type 2 diabetes mellitus without complications: Secondary | ICD-10-CM | POA: Diagnosis not present

## 2016-10-14 ENCOUNTER — Telehealth: Payer: Self-pay | Admitting: Family Medicine

## 2016-10-14 DIAGNOSIS — D510 Vitamin B12 deficiency anemia due to intrinsic factor deficiency: Secondary | ICD-10-CM | POA: Diagnosis not present

## 2016-10-14 DIAGNOSIS — I951 Orthostatic hypotension: Secondary | ICD-10-CM | POA: Diagnosis not present

## 2016-10-14 DIAGNOSIS — I4891 Unspecified atrial fibrillation: Secondary | ICD-10-CM | POA: Diagnosis not present

## 2016-10-14 DIAGNOSIS — E119 Type 2 diabetes mellitus without complications: Secondary | ICD-10-CM | POA: Diagnosis not present

## 2016-10-14 NOTE — Telephone Encounter (Signed)
Verbal given 

## 2016-10-14 NOTE — Telephone Encounter (Signed)
Okay to re-certify

## 2016-10-14 NOTE — Telephone Encounter (Signed)
Mia Padilla from [facility] needs verbal order for the patient to have recertification for her B12 once a month for 3 months  [phone] Mia Padilla  Thanks

## 2016-10-14 NOTE — Telephone Encounter (Signed)
Lab Results  Component Value Date   VITAMINB12 805 11/11/2015    Please advise. Okay to give verbal or should pt have B12 recheck first?

## 2016-10-16 DIAGNOSIS — I4891 Unspecified atrial fibrillation: Secondary | ICD-10-CM | POA: Diagnosis not present

## 2016-10-16 DIAGNOSIS — I951 Orthostatic hypotension: Secondary | ICD-10-CM | POA: Diagnosis not present

## 2016-10-16 DIAGNOSIS — E119 Type 2 diabetes mellitus without complications: Secondary | ICD-10-CM | POA: Diagnosis not present

## 2016-10-16 DIAGNOSIS — D510 Vitamin B12 deficiency anemia due to intrinsic factor deficiency: Secondary | ICD-10-CM | POA: Diagnosis not present

## 2016-10-19 DIAGNOSIS — E119 Type 2 diabetes mellitus without complications: Secondary | ICD-10-CM | POA: Diagnosis not present

## 2016-10-19 DIAGNOSIS — I951 Orthostatic hypotension: Secondary | ICD-10-CM | POA: Diagnosis not present

## 2016-10-19 DIAGNOSIS — I4891 Unspecified atrial fibrillation: Secondary | ICD-10-CM | POA: Diagnosis not present

## 2016-10-19 DIAGNOSIS — D510 Vitamin B12 deficiency anemia due to intrinsic factor deficiency: Secondary | ICD-10-CM | POA: Diagnosis not present

## 2016-10-21 DIAGNOSIS — D510 Vitamin B12 deficiency anemia due to intrinsic factor deficiency: Secondary | ICD-10-CM | POA: Diagnosis not present

## 2016-10-21 DIAGNOSIS — E119 Type 2 diabetes mellitus without complications: Secondary | ICD-10-CM | POA: Diagnosis not present

## 2016-10-21 DIAGNOSIS — I951 Orthostatic hypotension: Secondary | ICD-10-CM | POA: Diagnosis not present

## 2016-10-21 DIAGNOSIS — I4891 Unspecified atrial fibrillation: Secondary | ICD-10-CM | POA: Diagnosis not present

## 2016-10-22 DIAGNOSIS — D510 Vitamin B12 deficiency anemia due to intrinsic factor deficiency: Secondary | ICD-10-CM | POA: Diagnosis not present

## 2016-10-22 DIAGNOSIS — E119 Type 2 diabetes mellitus without complications: Secondary | ICD-10-CM | POA: Diagnosis not present

## 2016-10-22 DIAGNOSIS — I951 Orthostatic hypotension: Secondary | ICD-10-CM | POA: Diagnosis not present

## 2016-10-22 DIAGNOSIS — I4891 Unspecified atrial fibrillation: Secondary | ICD-10-CM | POA: Diagnosis not present

## 2016-10-28 DIAGNOSIS — I951 Orthostatic hypotension: Secondary | ICD-10-CM | POA: Diagnosis not present

## 2016-10-28 DIAGNOSIS — E119 Type 2 diabetes mellitus without complications: Secondary | ICD-10-CM | POA: Diagnosis not present

## 2016-10-28 DIAGNOSIS — D510 Vitamin B12 deficiency anemia due to intrinsic factor deficiency: Secondary | ICD-10-CM | POA: Diagnosis not present

## 2016-10-28 DIAGNOSIS — I4891 Unspecified atrial fibrillation: Secondary | ICD-10-CM | POA: Diagnosis not present

## 2016-11-03 DIAGNOSIS — D510 Vitamin B12 deficiency anemia due to intrinsic factor deficiency: Secondary | ICD-10-CM | POA: Diagnosis not present

## 2016-11-03 DIAGNOSIS — I4891 Unspecified atrial fibrillation: Secondary | ICD-10-CM | POA: Diagnosis not present

## 2016-11-03 DIAGNOSIS — I951 Orthostatic hypotension: Secondary | ICD-10-CM | POA: Diagnosis not present

## 2016-11-03 DIAGNOSIS — E119 Type 2 diabetes mellitus without complications: Secondary | ICD-10-CM | POA: Diagnosis not present

## 2016-11-05 DIAGNOSIS — D510 Vitamin B12 deficiency anemia due to intrinsic factor deficiency: Secondary | ICD-10-CM | POA: Diagnosis not present

## 2016-11-05 DIAGNOSIS — I951 Orthostatic hypotension: Secondary | ICD-10-CM | POA: Diagnosis not present

## 2016-11-05 DIAGNOSIS — E119 Type 2 diabetes mellitus without complications: Secondary | ICD-10-CM | POA: Diagnosis not present

## 2016-11-05 DIAGNOSIS — I4891 Unspecified atrial fibrillation: Secondary | ICD-10-CM | POA: Diagnosis not present

## 2016-11-07 DIAGNOSIS — I951 Orthostatic hypotension: Secondary | ICD-10-CM | POA: Diagnosis not present

## 2016-11-07 DIAGNOSIS — I4891 Unspecified atrial fibrillation: Secondary | ICD-10-CM | POA: Diagnosis not present

## 2016-11-07 DIAGNOSIS — E119 Type 2 diabetes mellitus without complications: Secondary | ICD-10-CM | POA: Diagnosis not present

## 2016-11-07 DIAGNOSIS — D510 Vitamin B12 deficiency anemia due to intrinsic factor deficiency: Secondary | ICD-10-CM | POA: Diagnosis not present

## 2016-11-08 NOTE — Progress Notes (Signed)
Cardiology Office Note  Date:  11/10/2016   ID:  Mia Padilla, DOB [birth year], MRN [id number]  PCP:  Mia Maple, MD   Chief Complaint  Patient presents with  . other    6 month follow up. Patient c/o swelling in ankles and knee pain. Meds reviewed verbally with patient.     HPI:  [name] is a 82 year old woman with history of  stroke,  seizure disorder,  hypertension,  COPD,  smoking for 20 years (1958 to 1978), on inhalers diabetes,  hypothyroidism,  obesity,  atrial fibrillation  hospital admission April 04 2011 for mental status changes.   echocardiogram showing normal LV function,  carotid ultrasound showing no significant disease,  head CT and MRI showing no significant stroke.   possible TIA.   started on warfarin for atrial fibrillation , Stopped since that time by primary care secondary to unsteady gait and risk of falls Lives with her son and his wife.  She presents today for follow up of her atrial fibrillation.   In follow-up today she reports that she is doing well, no complaints   stable trace lower extremity edema Takes Lasix as needed for ankle swelling Denies any PND, orthopnea, chest pain Feels her breathing is stable Having some problems with her right eye, feels it is allergy  EKG personally reviewed by myself on todays visit Normal sinus rhythm with no significant ST or T-wave changes, rare APC  Other past medical history reviewed  Urgent care around 1/15/ 2018: With symptoms of cough On 05/08/2016: She went to the emergency room for similar symptoms with Cough and SOB BNP 207, tnt 0.03  Lab work reviewed Total chol 132, LDL 64 CR 0.79 Total chol 122 A1C 6.2 TSH 7 Vit D 7.5, she is taking 50,000 units per week  Occasional vertigo for which she takes meclizine when necessary  Significant gait instability, uses a walker significant osteoarthritis of her knees. H/o mastectomy 08/03/2013 for breast cancer by Dr. Jamal Padilla. No  radiation treatment or chemotherapy needed, she is on Arimidex.   Coumadin was stopped in the past secondary to unsteady gait by Dr. Jeananne Rama. She was started on Plavix 10/13/2011. She denies any recent falls.  Periods of tachycardia in the past, better on metoprolol Notes indicate that she has had a history of seizures, possibly with low glucose levels. Previous diagnosis of complex partial seizures and she was started on antiseizure medications. Weaned off Keppra   PMH:   has a past medical history of Arthritis; Black head; Bowel incontinence; Breast cancer of upper-outer quadrant of left female breast ([place]) (2015); Carpal tunnel syndrome; COPD (chronic obstructive pulmonary disease) ([place]); Debilitated; Diabetes mellitus without complication ([place]); Glaucoma; History of seizures; Hyperlipidemia; Hypertension; Hypotension; Obesity; and Urinary incontinence.  PSH:    Past Surgical History:  Procedure Laterality Date  . BOWEL RESECTION  2005  . BREAST SURGERY  08/03/13   left mastectomy  . CARPAL TUNNEL RELEASE Right 30 years   . MASTECTOMY     left   . PARTIAL HYSTERECTOMY    . TONSILLECTOMY    . UMBILICAL HERNIA REPAIR      Current Outpatient Prescriptions  Medication Sig Dispense Refill  . acetaminophen (TYLENOL) 500 MG tablet Take 500 mg by mouth every 6 (six) hours as needed.    Marland Kitchen albuterol (PROVENTIL HFA;VENTOLIN HFA) 108 (90 Base) MCG/ACT inhaler Inhale 2 puffs into the lungs every 6 (six) hours as needed for wheezing or shortness of breath. 1 Inhaler 0  .  anastrozole (ARIMIDEX) 1 MG tablet Take 1 tablet (1 mg total) by mouth daily. 90 tablet 4  . aspirin EC 81 MG tablet Take 81 mg by mouth daily.    . benzonatate (TESSALON PERLES) 100 MG capsule Take 1 capsule (100 mg total) by mouth 3 (three) times daily as needed for cough. 20 capsule 2  . blood glucose meter kit and supplies Diagnosis:E11.9 Test glucose twice daily 1 each 12  . brimonidine (ALPHAGAN P) 0.1 % SOLN 2 (two)  times daily.     . clopidogrel (PLAVIX) 75 MG tablet Take 1 tablet (75 mg total) by mouth daily. 90 tablet 4  . cyanocobalamin (,VITAMIN B-12,) 1000 MCG/ML injection Inject 1 mL (1,000 mcg total) into the muscle every 30 (thirty) days. 1 mL 12  . digoxin (LANOXIN) 0.125 MG tablet Take 1 tablet (125 mcg total) by mouth daily. 90 tablet 1  . dorzolamide (TRUSOPT) 2 % ophthalmic solution 1 drop 2 (two) times daily.     . fluticasone (FLONASE) 50 MCG/ACT nasal spray Place 2 sprays into both nostrils daily. 16 g 12  . Fluticasone-Salmeterol (ADVAIR) 250-50 MCG/DOSE AEPB Inhale 1 puff into the lungs every 12 (twelve) hours. 180 each 4  . furosemide (LASIX) 20 MG tablet Take 1 tablet (20 mg total) by mouth daily as needed. 30 tablet 6  . glucose blood test strip Use as instructed 100 each 12  . LEVEMIR FLEXTOUCH 100 UNIT/ML Pen INJECT 23 TO 30 UNITS PER DAY 15 mL 11  . levothyroxine (SYNTHROID, LEVOTHROID) 100 MCG tablet Take 1 tablet (100 mcg total) by mouth daily. 90 tablet 0  . lisinopril (PRINIVIL,ZESTRIL) 10 MG tablet Take 1 tablet (10 mg total) by mouth daily. 90 tablet 4  . meclizine (ANTIVERT) 25 MG tablet Take 1 tablet (25 mg total) by mouth 3 (three) times daily as needed. 90 tablet 1  . metFORMIN (GLUCOPHAGE) 500 MG tablet Take 1 tablet (500 mg total) by mouth 2 (two) times daily with a meal. 180 tablet 4  . metoprolol succinate (TOPROL-XL) 100 MG 24 hr tablet Take 1 tablet (100 mg total) by mouth daily. 90 tablet 4  . mometasone (ELOCON) 0.1 % cream Apply 1 application topically daily. Do not use day in and day out 45 g 2  . mupirocin ointment (BACTROBAN) 2 % Apply 1 application topically 2 (two) times daily. Do not use day in and day out 22 g 2  . nystatin (MYCOSTATIN/NYSTOP) powder APPY TO THE AFFECTED AREA 4 TIMES DAILY 45 g 1  . Olopatadine HCl (PATADAY) 0.2 % SOLN Apply to eye daily.    . solifenacin (VESICARE) 10 MG tablet Take 1 tablet (10 mg total) by mouth daily. 90 tablet 2  .  travoprost, benzalkonium, (TRAVATAN) 0.004 % ophthalmic solution Apply 1 drop to eye at bedtime.    . Vitamin D, Ergocalciferol, (DRISDOL) 50000 units CAPS capsule TAKE 1 CAPSULE BY MOUTH EVERY 7 DAYS. 12 capsule 0   No current facility-administered medications for this visit.      Allergies:   Propoxyphene; Advil [ibuprofen]; Darvocet [propoxyphene n-acetaminophen]; and Percocet [oxycodone-acetaminophen]   Social History:  The patient  reports that she has quit smoking. She has never used smokeless tobacco. She reports that she does not drink alcohol or use drugs.   Family History:   family history includes Cervical cancer in her daughter and mother; Diabetes in her brother and mother; Glaucoma in her mother; HIV in her brother; Hypertension in her brother, daughter, father, sister,  and son.    Review of Systems: Review of Systems  Constitutional: Negative.   Respiratory: Negative.   Cardiovascular: Positive for leg swelling.  Gastrointestinal: Negative.   Musculoskeletal: Negative.   Neurological: Negative.   Psychiatric/Behavioral: Negative.   All other systems reviewed and are negative.    PHYSICAL EXAM: VS:  BP (!) 102/52 (BP Location: Right Arm, Patient Position: Sitting, Cuff Size: Normal)   Pulse 70   Ht '5\' 3"'$  (1.6 m)   Wt 190 lb 12 oz (86.5 kg)   BMI 33.79 kg/m  , BMI Body mass index is 33.79 kg/m.  GEN: Well nourished, well developed, in no acute distress ,  obese  HEENT: normal  Neck: no JVD, carotid bruits, or masses Cardiac: RRR; no murmurs, rubs, or gallops, trace  nonpitting edema  Respiratory:  Decreased BS moderately throughout, normal work of breathing GI: soft, nontender, nondistended, + BS MS: no deformity or atrophy  Skin: warm and dry, no rash Neuro:  Strength and sensation are intact Psych: euthymic mood, full affect    Recent Labs: 02/11/2016: TSH 7.080 05/08/2016: B Natriuretic Peptide 207.0; Hemoglobin 12.6; Platelets 216 05/18/2016: ALT  (SGPT) [place], Waived 17; BUN 23; Creatinine, Ser 0.79; Potassium 4.4; Sodium 140    Lipid Panel Lab Results  Component Value Date   CHOL 122 05/18/2016   HDL 46 11/11/2015   LDLCALC 64 11/11/2015   TRIG 95 05/18/2016      Wt Readings from Last 3 Encounters:  11/10/16 190 lb 12 oz (86.5 kg)  08/18/16 202 lb (91.6 kg)  06/08/16 196 lb (88.9 kg)       ASSESSMENT AND PLAN:   Paroxysmal atrial fibrillation (HCC) - Plan: EKG 12-Lead Maintaining normal sinus rhythm on today's visit Not on anticoagulation, high fall risk, stopped by primary care  Essential hypertension Blood pressure is well controlled on today's visit. No changes made to the medications. Continues to take Lasix as needed for leg swelling   Centrilobular emphysema (HCC) Recent COPD exacerbation Treated with Augmentin, prednisone. Hospital records reviewed with her in detail  Type 2 diabetes mellitus with diabetic peripheral angiopathy without gangrene, without long-term current use of insulin (HCC) Unable to exercise. Recommended low carbohydrate diet  Chronic diastolic congestive heart failure (HCC) Suggested Lasix as needed for leg swelling  Exam consistent with chronic trace woody leg edema to the mid shins  She does have incontinence   Total encounter time more than 25 minutes  Greater than 50% was spent in counseling and coordination of care with the patient   Disposition:   F/U  12 months   Orders Placed This Encounter  Procedures  . EKG 12-Lead     Signed, Esmond Plants, M.D., Ph.D. 11/10/2016  [place], [place]

## 2016-11-09 DIAGNOSIS — I951 Orthostatic hypotension: Secondary | ICD-10-CM | POA: Diagnosis not present

## 2016-11-09 DIAGNOSIS — I4891 Unspecified atrial fibrillation: Secondary | ICD-10-CM | POA: Diagnosis not present

## 2016-11-09 DIAGNOSIS — D510 Vitamin B12 deficiency anemia due to intrinsic factor deficiency: Secondary | ICD-10-CM | POA: Diagnosis not present

## 2016-11-09 DIAGNOSIS — E119 Type 2 diabetes mellitus without complications: Secondary | ICD-10-CM | POA: Diagnosis not present

## 2016-11-10 ENCOUNTER — Encounter: Payer: Self-pay | Admitting: Cardiovascular Disease

## 2016-11-10 ENCOUNTER — Ambulatory Visit (INDEPENDENT_AMBULATORY_CARE_PROVIDER_SITE_OTHER): Payer: Medicare Other | Admitting: Cardiovascular Disease

## 2016-11-10 VITALS — BP 102/52 | HR 70 | Ht 63.0 in | Wt 190.8 lb

## 2016-11-10 DIAGNOSIS — I10 Essential (primary) hypertension: Secondary | ICD-10-CM | POA: Diagnosis not present

## 2016-11-10 DIAGNOSIS — I480 Paroxysmal atrial fibrillation: Secondary | ICD-10-CM | POA: Diagnosis not present

## 2016-11-10 DIAGNOSIS — E1151 Type 2 diabetes mellitus with diabetic peripheral angiopathy without gangrene: Secondary | ICD-10-CM | POA: Diagnosis not present

## 2016-11-10 DIAGNOSIS — I739 Peripheral vascular disease, unspecified: Secondary | ICD-10-CM

## 2016-11-10 DIAGNOSIS — J432 Centrilobular emphysema: Secondary | ICD-10-CM

## 2016-11-10 NOTE — Patient Instructions (Signed)

## 2016-11-11 DIAGNOSIS — I4891 Unspecified atrial fibrillation: Secondary | ICD-10-CM | POA: Diagnosis not present

## 2016-11-11 DIAGNOSIS — D510 Vitamin B12 deficiency anemia due to intrinsic factor deficiency: Secondary | ICD-10-CM | POA: Diagnosis not present

## 2016-11-11 DIAGNOSIS — I951 Orthostatic hypotension: Secondary | ICD-10-CM | POA: Diagnosis not present

## 2016-11-11 DIAGNOSIS — E119 Type 2 diabetes mellitus without complications: Secondary | ICD-10-CM | POA: Diagnosis not present

## 2016-11-12 ENCOUNTER — Ambulatory Visit (INDEPENDENT_AMBULATORY_CARE_PROVIDER_SITE_OTHER): Payer: Medicare Other

## 2016-11-12 VITALS — BP 118/74 | HR 88 | Temp 97.5°F | Resp 16 | Ht 62.0 in | Wt 193.0 lb

## 2016-11-12 DIAGNOSIS — Z0000 Encounter for general adult medical examination without abnormal findings: Secondary | ICD-10-CM

## 2016-11-12 NOTE — Patient Instructions (Addendum)
Mia Padilla , Thank you for taking time to come for your Medicare Wellness Visit. I appreciate your ongoing commitment to your health goals. Please review the following plan we discussed and let me know if I can assist you in the future.   Screening recommendations/referrals: Colonoscopy: no longer required Mammogram: completed 06/01/2016, no longer required Bone Density: completed 08/14/2009 Recommended yearly ophthalmology/optometry visit for glaucoma screening and checkup Recommended yearly dental visit for hygiene and checkup  Vaccinations: Influenza vaccine: up to date, due 12/2016 Pneumococcal vaccine: up to date Tdap vaccine: up to date Shingles vaccine: due, check with your insurance company for coverage  Advanced directives: Advance directive discussed with you today. I have provided a copy for you to complete at home and have notarized. Once this is complete please bring a copy in to our office so we can scan it into your chart.  Conditions/risks identified: Recommend to continue drinking at least 8 glasses of water a day   Next appointment: Follow up on 11/19/2016 at 10:00am with Dr.Crissman. Follow up in one year for your annual wellness exam.    Preventive Care 65 Years and Older, Female Preventive care refers to lifestyle choices and visits with your health care provider that can promote health and wellness. What does preventive care include?  A yearly physical exam. This is also called an annual well check.  Dental exams once or twice a year.  Routine eye exams. Ask your health care provider how often you should have your eyes checked.  Personal lifestyle choices, including:  Daily care of your teeth and gums.  Regular physical activity.  Eating a healthy diet.  Avoiding tobacco and drug use.  Limiting alcohol use.  Practicing safe sex.  Taking low-dose aspirin every day.  Taking vitamin and mineral supplements as recommended by your health care  provider. What happens during an annual well check? The services and screenings done by your health care provider during your annual well check will depend on your age, overall health, lifestyle risk factors, and family history of disease. Counseling  Your health care provider may ask you questions about your:  Alcohol use.  Tobacco use.  Drug use.  Emotional well-being.  Home and relationship well-being.  Sexual activity.  Eating habits.  History of falls.  Memory and ability to understand (cognition).  Work and work Statistician.  Reproductive health. Screening  You may have the following tests or measurements:  Height, weight, and BMI.  Blood pressure.  Lipid and cholesterol levels. These may be checked every 5 years, or more frequently if you are over 5 years old.  Skin check.  Lung cancer screening. You may have this screening every year starting at age 66 if you have a 30-pack-year history of smoking and currently smoke or have quit within the past 15 years.  Fecal occult blood test (FOBT) of the stool. You may have this test every year starting at age 35.  Flexible sigmoidoscopy or colonoscopy. You may have a sigmoidoscopy every 5 years or a colonoscopy every 10 years starting at age 41.  Hepatitis C blood test.  Hepatitis B blood test.  Sexually transmitted disease (STD) testing.  Diabetes screening. This is done by checking your blood sugar (glucose) after you have not eaten for a while (fasting). You may have this done every 1-3 years.  Bone density scan. This is done to screen for osteoporosis. You may have this done starting at age 58.  Mammogram. This may be done every 1-2 years.  Talk to your health care provider about how often you should have regular mammograms. Talk with your health care provider about your test results, treatment options, and if necessary, the need for more tests. Vaccines  Your health care provider may recommend certain  vaccines, such as:  Influenza vaccine. This is recommended every year.  Tetanus, diphtheria, and acellular pertussis (Tdap, Td) vaccine. You may need a Td booster every 10 years.  Zoster vaccine. You may need this after age 23.  Pneumococcal 13-valent conjugate (PCV13) vaccine. One dose is recommended after age 22.  Pneumococcal polysaccharide (PPSV23) vaccine. One dose is recommended after age 70. Talk to your health care provider about which screenings and vaccines you need and how often you need them. This information is not intended to replace advice given to you by your health care provider. Make sure you discuss any questions you have with your health care provider. Document Released: 05/03/2015 Document Revised: 12/25/2015 Document Reviewed: 02/05/2015 Elsevier Interactive Patient Education  2017 [place] Prevention in the Home Falls can cause injuries. They can happen to people of all ages. There are many things you can do to make your home safe and to help prevent falls. What can I do on the outside of my home?  Regularly fix the edges of walkways and driveways and fix any cracks.  Remove anything that might make you trip as you walk through a door, such as a raised step or threshold.  Trim any bushes or trees on the path to your home.  Use bright outdoor lighting.  Clear any walking paths of anything that might make someone trip, such as rocks or tools.  Regularly check to see if handrails are loose or broken. Make sure that both sides of any steps have handrails.  Any raised decks and porches should have guardrails on the edges.  Have any leaves, snow, or ice cleared regularly.  Use sand or salt on walking paths during winter.  Clean up any spills in your garage right away. This includes oil or grease spills. What can I do in the bathroom?  Use night lights.  Install grab bars by the toilet and in the tub and shower. Do not use towel bars as grab  bars.  Use non-skid mats or decals in the tub or shower.  If you need to sit down in the shower, use a plastic, non-slip stool.  Keep the floor dry. Clean up any water that spills on the floor as soon as it happens.  Remove soap buildup in the tub or shower regularly.  Attach bath mats securely with double-sided non-slip rug tape.  Do not have throw rugs and other things on the floor that can make you trip. What can I do in the bedroom?  Use night lights.  Make sure that you have a light by your bed that is easy to reach.  Do not use any sheets or blankets that are too big for your bed. They should not hang down onto the floor.  Have a firm chair that has side arms. You can use this for support while you get dressed.  Do not have throw rugs and other things on the floor that can make you trip. What can I do in the kitchen?  Clean up any spills right away.  Avoid walking on wet floors.  Keep items that you use a lot in easy-to-reach places.  If you need to reach something above you, use a strong step stool  that has a grab bar.  Keep electrical cords out of the way.  Do not use floor polish or wax that makes floors slippery. If you must use wax, use non-skid floor wax.  Do not have throw rugs and other things on the floor that can make you trip. What can I do with my stairs?  Do not leave any items on the stairs.  Make sure that there are handrails on both sides of the stairs and use them. Fix handrails that are broken or loose. Make sure that handrails are as long as the stairways.  Check any carpeting to make sure that it is firmly attached to the stairs. Fix any carpet that is loose or worn.  Avoid having throw rugs at the top or bottom of the stairs. If you do have throw rugs, attach them to the floor with carpet tape.  Make sure that you have a light switch at the top of the stairs and the bottom of the stairs. If you do not have them, ask someone to add them for  you. What else can I do to help prevent falls?  Wear shoes that:  Do not have high heels.  Have rubber bottoms.  Are comfortable and fit you well.  Are closed at the toe. Do not wear sandals.  If you use a stepladder:  Make sure that it is fully opened. Do not climb a closed stepladder.  Make sure that both sides of the stepladder are locked into place.  Ask someone to hold it for you, if possible.  Clearly mark and make sure that you can see:  Any grab bars or handrails.  First and last steps.  Where the edge of each step is.  Use tools that help you move around (mobility aids) if they are needed. These include:  Canes.  Walkers.  Scooters.  Crutches.  Turn on the lights when you go into a dark area. Replace any light bulbs as soon as they burn out.  Set up your furniture so you have a clear path. Avoid moving your furniture around.  If any of your floors are uneven, fix them.  If there are any pets around you, be aware of where they are.  Review your medicines with your doctor. Some medicines can make you feel dizzy. This can increase your chance of falling. Ask your doctor what other things that you can do to help prevent falls. This information is not intended to replace advice given to you by your health care provider. Make sure you discuss any questions you have with your health care provider. Document Released: 01/31/2009 Document Revised: 09/12/2015 Document Reviewed: 05/11/2014 Elsevier Interactive Patient Education  2017 [employer].

## 2016-11-12 NOTE — Progress Notes (Signed)
 Subjective:   Mia Padilla is a 82 y.o. female who presents for Medicare Annual (Subsequent) preventive examination.  Review of Systems:  Cardiac Risk Factors include: advanced age (>55men, >65 women);diabetes mellitus;dyslipidemia;hypertension;obesity (BMI >30kg/m2)     Objective:     Vitals: BP 118/74 (BP Location: Left Arm, Patient Position: Sitting)   Pulse 88   Temp (!) 97.5 F (36.4 C)   Resp 16   Ht 5' 2" (1.575 m)   Wt 193 lb (87.5 kg)   BMI 35.30 kg/m   Body mass index is 35.3 kg/m.   Tobacco History  Smoking Status  . Former Smoker  . Quit date: 04/20/1974  Smokeless Tobacco  . Never Used    Comment: Quit 1976     Counseling given: Not Answered   Past Medical History:  Diagnosis Date  . Arthritis    hands  . Black head   . Bowel incontinence   . Breast cancer of upper-outer quadrant of left female breast (HCC) 2015   Left  . Carpal tunnel syndrome   . COPD (chronic obstructive pulmonary disease) (HCC)   . Debilitated   . Diabetes mellitus without complication (HCC)   . Glaucoma   . History of seizures   . Hyperlipidemia   . Hypertension   . Hypotension   . Obesity   . Urinary incontinence    Past Surgical History:  Procedure Laterality Date  . BOWEL RESECTION  2005  . BREAST SURGERY  08/03/13   left mastectomy  . CARPAL TUNNEL RELEASE Right 30 years   . MASTECTOMY     left   . PARTIAL HYSTERECTOMY    . TONSILLECTOMY    . UMBILICAL HERNIA REPAIR     Family History  Problem Relation Age of Onset  . Cervical cancer Mother   . Diabetes Mother   . Glaucoma Mother   . Cervical cancer Daughter   . Hypertension Daughter   . Hypertension Father   . Hypertension Sister   . Hypertension Brother   . Hypertension Son   . Diabetes Brother   . HIV Brother   . Breast cancer Neg Hx    History  Sexual Activity  . Sexual activity: Not on file    Outpatient Encounter Prescriptions as of 11/12/2016  Medication Sig  . acetaminophen (TYLENOL)  500 MG tablet Take 500 mg by mouth every 6 (six) hours as needed.  . albuterol (PROVENTIL HFA;VENTOLIN HFA) 108 (90 Base) MCG/ACT inhaler Inhale 2 puffs into the lungs every 6 (six) hours as needed for wheezing or shortness of breath.  . anastrozole (ARIMIDEX) 1 MG tablet Take 1 tablet (1 mg total) by mouth daily.  . aspirin EC 81 MG tablet Take 81 mg by mouth daily.  . benzonatate (TESSALON PERLES) 100 MG capsule Take 1 capsule (100 mg total) by mouth 3 (three) times daily as needed for cough.  . blood glucose meter kit and supplies Diagnosis:E11.9 Test glucose twice daily  . brimonidine (ALPHAGAN P) 0.1 % SOLN 2 (two) times daily.   . clopidogrel (PLAVIX) 75 MG tablet Take 1 tablet (75 mg total) by mouth daily.  . cyanocobalamin (,VITAMIN B-12,) 1000 MCG/ML injection Inject 1 mL (1,000 mcg total) into the muscle every 30 (thirty) days.  . digoxin (LANOXIN) 0.125 MG tablet Take 1 tablet (125 mcg total) by mouth daily.  . dorzolamide (TRUSOPT) 2 % ophthalmic solution 1 drop 2 (two) times daily.   . fluticasone (FLONASE) 50 MCG/ACT nasal spray Place 2   sprays into both nostrils daily.  . Fluticasone-Salmeterol (ADVAIR) 250-50 MCG/DOSE AEPB Inhale 1 puff into the lungs every 12 (twelve) hours.  . furosemide (LASIX) 20 MG tablet Take 1 tablet (20 mg total) by mouth daily as needed.  . glucose blood test strip Use as instructed  . LEVEMIR FLEXTOUCH 100 UNIT/ML Pen INJECT 23 TO 30 UNITS PER DAY  . levothyroxine (SYNTHROID, LEVOTHROID) 100 MCG tablet Take 1 tablet (100 mcg total) by mouth daily.  . lisinopril (PRINIVIL,ZESTRIL) 10 MG tablet Take 1 tablet (10 mg total) by mouth daily.  . meclizine (ANTIVERT) 25 MG tablet Take 1 tablet (25 mg total) by mouth 3 (three) times daily as needed.  . metFORMIN (GLUCOPHAGE) 500 MG tablet Take 1 tablet (500 mg total) by mouth 2 (two) times daily with a meal.  . metoprolol succinate (TOPROL-XL) 100 MG 24 hr tablet Take 1 tablet (100 mg total) by mouth daily.  .  mometasone (ELOCON) 0.1 % cream Apply 1 application topically daily. Do not use day in and day out  . mupirocin ointment (BACTROBAN) 2 % Apply 1 application topically 2 (two) times daily. Do not use day in and day out  . nystatin (MYCOSTATIN/NYSTOP) powder APPY TO THE AFFECTED AREA 4 TIMES DAILY  . Olopatadine HCl (PATADAY) 0.2 % SOLN Apply to eye daily.  . solifenacin (VESICARE) 10 MG tablet Take 1 tablet (10 mg total) by mouth daily.  . travoprost, benzalkonium, (TRAVATAN) 0.004 % ophthalmic solution Apply 1 drop to eye at bedtime.  . Vitamin D, Ergocalciferol, (DRISDOL) 50000 units CAPS capsule TAKE 1 CAPSULE BY MOUTH EVERY 7 DAYS. (Patient not taking: Reported on 11/12/2016)   No facility-administered encounter medications on file as of 11/12/2016.     Activities of Daily Living In your present state of health, do you have any difficulty performing the following activities: 11/12/2016  Hearing? N  Vision? Y  Difficulty concentrating or making decisions? Y  Walking or climbing stairs? Y  Dressing or bathing? Y  Doing errands, shopping? Y  Preparing Food and eating ? Y  Using the Toilet? N  In the past six months, have you accidently leaked urine? Y  Do you have problems with loss of bowel control? Y  Managing your Medications? N  Managing your Finances? N  Housekeeping or managing your Housekeeping? Y  Some recent data might be hidden    Patient Care Team: Crissman, Mark A, MD as PCP - General (Unknown Physician Specialty) Sankar, Seeplaputhur G, MD as Consulting Physician (General Surgery) Crissman, Mark A, MD (Family Medicine) Dew, Jason S, MD as Referring Physician (Vascular Surgery) Challa, Pratap, MD as Referring Physician (Ophthalmology) Gollan, Timothy J, MD as Consulting Physician (Cardiology)    Assessment:     Exercise Activities and Dietary recommendations Current Exercise Habits: The patient does not participate in regular exercise at present, Exercise limited by:  orthopedic condition(s);cardiac condition(s)  Goals    . Increase water intake          Recommend to continue drinking at least 8 glasses of water a day       Fall Risk Fall Risk  11/12/2016 08/18/2016 05/18/2016 11/11/2015 11/11/2015  Falls in the past year? No No No No No   Depression Screen PHQ 2/9 Scores 11/12/2016 08/18/2016 11/11/2015 11/11/2015  PHQ - 2 Score 1 0 2 1  PHQ- 9 Score - - 7 -     Cognitive Function     6CIT Screen 11/12/2016  What Year? 0 points  What   month? 0 points  What time? 0 points  Count back from 20 0 points  Months in reverse 0 points  Repeat phrase 2 points  Total Score 2    Immunization History  Administered Date(s) Administered  . Influenza, High Dose Seasonal PF 03/17/2016  . Influenza,inj,Quad PF,36+ Mos 02/07/2015  . Influenza-Unspecified 01/18/2014  . Pneumococcal Conjugate-13 10/12/2013  . Pneumococcal-Unspecified 05/20/2004  . Td 05/20/2011   Screening Tests Health Maintenance  Topic Date Due  . FOOT EXAM  11/19/2016 (Originally 08/04/2016)  . INFLUENZA VACCINE  11/18/2016  . HEMOGLOBIN A1C  02/18/2017  . OPHTHALMOLOGY EXAM  09/10/2017  . TETANUS/TDAP  05/19/2021  . DEXA SCAN  Completed  . PNA vac Low Risk Adult  Completed      Plan:    I have personally reviewed and addressed the Medicare Annual Wellness questionnaire and have noted the following in the patient's chart:  A. Medical and social history B. Use of alcohol, tobacco or illicit drugs  C. Current medications and supplements D. Functional ability and status E.  Nutritional status F.  Physical activity G. Advance directives H. List of other physicians I.  Hospitalizations, surgeries, and ER visits in previous 12 months J.  [place] such as hearing and vision if needed, cognitive and depression L. Referrals and appointments   In addition, I have reviewed and discussed with patient certain preventive protocols, quality metrics, and best practice  recommendations. A written personalized care plan for preventive services as well as general preventive health recommendations were provided to patient.   Signed,  Tyler Aas, LPN Nurse Health Advisor   MD Recommendations: due for diabetic foot exam

## 2016-11-13 DIAGNOSIS — D510 Vitamin B12 deficiency anemia due to intrinsic factor deficiency: Secondary | ICD-10-CM | POA: Diagnosis not present

## 2016-11-13 DIAGNOSIS — I4891 Unspecified atrial fibrillation: Secondary | ICD-10-CM | POA: Diagnosis not present

## 2016-11-13 DIAGNOSIS — E119 Type 2 diabetes mellitus without complications: Secondary | ICD-10-CM | POA: Diagnosis not present

## 2016-11-13 DIAGNOSIS — I951 Orthostatic hypotension: Secondary | ICD-10-CM | POA: Diagnosis not present

## 2016-11-16 ENCOUNTER — Other Ambulatory Visit (INDEPENDENT_AMBULATORY_CARE_PROVIDER_SITE_OTHER): Payer: Self-pay | Admitting: Vascular Surgery

## 2016-11-16 DIAGNOSIS — E119 Type 2 diabetes mellitus without complications: Secondary | ICD-10-CM | POA: Diagnosis not present

## 2016-11-16 DIAGNOSIS — D510 Vitamin B12 deficiency anemia due to intrinsic factor deficiency: Secondary | ICD-10-CM | POA: Diagnosis not present

## 2016-11-16 DIAGNOSIS — I4891 Unspecified atrial fibrillation: Secondary | ICD-10-CM | POA: Diagnosis not present

## 2016-11-16 DIAGNOSIS — I951 Orthostatic hypotension: Secondary | ICD-10-CM | POA: Diagnosis not present

## 2016-11-16 DIAGNOSIS — I6523 Occlusion and stenosis of bilateral carotid arteries: Secondary | ICD-10-CM

## 2016-11-17 ENCOUNTER — Ambulatory Visit (INDEPENDENT_AMBULATORY_CARE_PROVIDER_SITE_OTHER): Payer: Medicare Other

## 2016-11-17 ENCOUNTER — Encounter (INDEPENDENT_AMBULATORY_CARE_PROVIDER_SITE_OTHER): Payer: Self-pay | Admitting: Vascular Surgery

## 2016-11-17 ENCOUNTER — Ambulatory Visit (INDEPENDENT_AMBULATORY_CARE_PROVIDER_SITE_OTHER): Payer: Medicare Other | Admitting: Vascular Surgery

## 2016-11-17 VITALS — BP 107/58 | HR 95 | Resp 16 | Wt 192.0 lb

## 2016-11-17 DIAGNOSIS — I10 Essential (primary) hypertension: Secondary | ICD-10-CM

## 2016-11-17 DIAGNOSIS — E1151 Type 2 diabetes mellitus with diabetic peripheral angiopathy without gangrene: Secondary | ICD-10-CM | POA: Diagnosis not present

## 2016-11-17 DIAGNOSIS — I6523 Occlusion and stenosis of bilateral carotid arteries: Secondary | ICD-10-CM

## 2016-11-17 DIAGNOSIS — I6529 Occlusion and stenosis of unspecified carotid artery: Secondary | ICD-10-CM | POA: Insufficient documentation

## 2016-11-17 DIAGNOSIS — I872 Venous insufficiency (chronic) (peripheral): Secondary | ICD-10-CM | POA: Diagnosis not present

## 2016-11-17 DIAGNOSIS — I890 Lymphedema, not elsewhere classified: Secondary | ICD-10-CM | POA: Diagnosis not present

## 2016-11-17 HISTORY — DX: Occlusion and stenosis of unspecified carotid artery: I65.29

## 2016-11-17 NOTE — Assessment & Plan Note (Signed)
blood glucose control important in reducing the progression of atherosclerotic disease. Also, involved in wound healing. On appropriate medications.  

## 2016-11-17 NOTE — Progress Notes (Signed)
MRN : [id number]  Mia Padilla is a 82 y.o. (May 17, 1934) female who presents with chief complaint of  Chief Complaint  Patient presents with  . ultrasound follow up  .  History of Present Illness: Patient returns in follow-up of her carotid disease. She denies any focal neurologic symptoms. Specifically, the patient denies amaurosis fugax, speech or swallowing difficulties, or arm or leg weakness or numbness. Her carotid duplex today reveals stable 40-59% right ICA stenosis and 1-39% left ICA stenosis and this has not changed from her study several years ago. We have also followed her for leg swelling which is pretty stable right now. She is on a diuretic and her swelling is currently reasonably mild. No new ulcerations or infection.  Current Outpatient Prescriptions  Medication Sig Dispense Refill  . acetaminophen (TYLENOL) 500 MG tablet Take 500 mg by mouth every 6 (six) hours as needed.    Marland Kitchen albuterol (PROVENTIL HFA;VENTOLIN HFA) 108 (90 Base) MCG/ACT inhaler Inhale 2 puffs into the lungs every 6 (six) hours as needed for wheezing or shortness of breath. 1 Inhaler 0  . anastrozole (ARIMIDEX) 1 MG tablet Take 1 tablet (1 mg total) by mouth daily. 90 tablet 4  . aspirin EC 81 MG tablet Take 81 mg by mouth daily.    . benzonatate (TESSALON PERLES) 100 MG capsule Take 1 capsule (100 mg total) by mouth 3 (three) times daily as needed for cough. 20 capsule 2  . blood glucose meter kit and supplies Diagnosis:E11.9 Test glucose twice daily 1 each 12  . brimonidine (ALPHAGAN P) 0.1 % SOLN 2 (two) times daily.     . clopidogrel (PLAVIX) 75 MG tablet Take 1 tablet (75 mg total) by mouth daily. 90 tablet 4  . cyanocobalamin (,VITAMIN B-12,) 1000 MCG/ML injection Inject 1 mL (1,000 mcg total) into the muscle every 30 (thirty) days. 1 mL 12  . digoxin (LANOXIN) 0.125 MG tablet Take 1 tablet (125 mcg total) by mouth daily. 90 tablet 1  . dorzolamide (TRUSOPT) 2 % ophthalmic solution 1 drop 2 (two)  times daily.     . fluticasone (FLONASE) 50 MCG/ACT nasal spray Place 2 sprays into both nostrils daily. 16 g 12  . Fluticasone-Salmeterol (ADVAIR) 250-50 MCG/DOSE AEPB Inhale 1 puff into the lungs every 12 (twelve) hours. 180 each 4  . furosemide (LASIX) 20 MG tablet Take 1 tablet (20 mg total) by mouth daily as needed. 30 tablet 6  . glucose blood test strip Use as instructed 100 each 12  . LEVEMIR FLEXTOUCH 100 UNIT/ML Pen INJECT 23 TO 30 UNITS PER DAY 15 mL 11  . levothyroxine (SYNTHROID, LEVOTHROID) 100 MCG tablet Take 1 tablet (100 mcg total) by mouth daily. 90 tablet 0  . lisinopril (PRINIVIL,ZESTRIL) 10 MG tablet Take 1 tablet (10 mg total) by mouth daily. 90 tablet 4  . meclizine (ANTIVERT) 25 MG tablet Take 1 tablet (25 mg total) by mouth 3 (three) times daily as needed. 90 tablet 1  . metFORMIN (GLUCOPHAGE) 500 MG tablet Take 1 tablet (500 mg total) by mouth 2 (two) times daily with a meal. 180 tablet 4  . metoprolol succinate (TOPROL-XL) 100 MG 24 hr tablet Take 1 tablet (100 mg total) by mouth daily. 90 tablet 4  . mometasone (ELOCON) 0.1 % cream Apply 1 application topically daily. Do not use day in and day out 45 g 2  . mupirocin ointment (BACTROBAN) 2 % Apply 1 application topically 2 (two) times daily. Do not use  day in and day out 22 g 2  . nystatin (MYCOSTATIN/NYSTOP) powder APPY TO THE AFFECTED AREA 4 TIMES DAILY 45 g 1  . Olopatadine HCl (PATADAY) 0.2 % SOLN Apply to eye daily.    . solifenacin (VESICARE) 10 MG tablet Take 1 tablet (10 mg total) by mouth daily. 90 tablet 2  . travoprost, benzalkonium, (TRAVATAN) 0.004 % ophthalmic solution Apply 1 drop to eye at bedtime.    . Vitamin D, Ergocalciferol, (DRISDOL) 50000 units CAPS capsule TAKE 1 CAPSULE BY MOUTH EVERY 7 DAYS. 12 capsule 0   No current facility-administered medications for this visit.     Past Medical History:  Diagnosis Date  . Arthritis    hands  . Black head   . Bowel incontinence   . Breast cancer of  upper-outer quadrant of left female breast ([place]) 2015   Left  . Carpal tunnel syndrome   . COPD (chronic obstructive pulmonary disease) ([place])   . Debilitated   . Diabetes mellitus without complication ([place])   . Glaucoma   . History of seizures   . Hyperlipidemia   . Hypertension   . Hypotension   . Obesity   . Urinary incontinence     Past Surgical History:  Procedure Laterality Date  . BOWEL RESECTION  2005  . BREAST SURGERY  08/03/13   left mastectomy  . CARPAL TUNNEL RELEASE Right 30 years   . MASTECTOMY     left   . PARTIAL HYSTERECTOMY    . TONSILLECTOMY    . UMBILICAL HERNIA REPAIR      Social History Social History  Substance Use Topics  . Smoking status: Former Smoker    Quit date: 04/20/1974  . Smokeless tobacco: Never Used     Comment: Quit 1976  . Alcohol use No     Family History Family History  Problem Relation Age of Onset  . Cervical cancer Mother   . Diabetes Mother   . Glaucoma Mother   . Cervical cancer Daughter   . Hypertension Daughter   . Hypertension Father   . Hypertension Sister   . Hypertension Brother   . Hypertension Son   . Diabetes Brother   . HIV Brother   . Breast cancer Neg Hx      Allergies  Allergen Reactions  . Propoxyphene Other (See Comments)    Other Reaction: Severe Headache  . Advil [Ibuprofen] Other (See Comments)    Makes her heart race  . Darvocet [Propoxyphene N-Acetaminophen]   . Percocet [Oxycodone-Acetaminophen]     hallucination     REVIEW OF SYSTEMS (Negative unless checked)  Constitutional: '[]'$ Weight loss  '[]'$ Fever  '[]'$ Chills Cardiac: '[]'$ Chest pain   '[]'$ Chest pressure   '[]'$ Palpitations   '[]'$ Shortness of breath when laying flat   '[]'$ Shortness of breath at rest   '[]'$ Shortness of breath with exertion. Vascular:  '[]'$ Pain in legs with walking   '[]'$ Pain in legs at rest   '[]'$ Pain in legs when laying flat   '[]'$ Claudication   '[]'$ Pain in feet when walking  '[]'$ Pain in feet at rest  '[]'$ Pain in feet when laying flat    '[]'$ History of DVT   '[]'$ Phlebitis   '[x]'$ Swelling in legs   '[]'$ Varicose veins   '[]'$ Non-healing ulcers Pulmonary:   '[]'$ Uses home oxygen   '[]'$ Productive cough   '[]'$ Hemoptysis   '[]'$ Wheeze  '[]'$ COPD   '[]'$ Asthma Neurologic:  '[]'$ Dizziness  '[]'$ Blackouts   '[]'$ Seizures   '[]'$ History of stroke   '[]'$ History of TIA  '[]'$ Aphasia   '[]'$ Temporary blindness   '[]'$   Dysphagia   '[]'$ Weakness or numbness in arms   '[]'$ Weakness or numbness in legs Musculoskeletal:  '[x]'$ Arthritis   '[]'$ Joint swelling   '[]'$ Joint pain   '[]'$ Low back pain Hematologic:  '[]'$ Easy bruising  '[]'$ Easy bleeding   '[]'$ Hypercoagulable state   '[]'$ Anemic  '[]'$ Hepatitis Gastrointestinal:  '[]'$ Blood in stool   '[]'$ Vomiting blood  '[]'$ Gastroesophageal reflux/heartburn   '[]'$ Difficulty swallowing. Genitourinary:  '[x]'$ Chronic kidney disease   '[]'$ Difficult urination  '[]'$ Frequent urination  '[]'$ Burning with urination   '[]'$ Blood in urine Skin:  '[]'$ Rashes   '[]'$ Ulcers   '[]'$ Wounds Psychological:  '[]'$ History of anxiety   '[]'$  History of major depression.  Physical Examination  Vitals:   11/17/16 1404  BP: (!) 107/58  Pulse: 95  Resp: 16  Weight: 192 lb (87.1 kg)   Body mass index is 35.12 kg/m. Gen:  WD/WN, NAD Head: [place]/AT, No temporalis wasting. Ear/Nose/Throat: Hearing grossly intact, nares w/o erythema or drainage, trachea midline Eyes: Conjunctiva clear. Sclera non-icteric Neck: Supple.  No bruit or JVD.  Pulmonary:  Good air movement, equal and clear to auscultation bilaterally.  Cardiac: RRR, normal S1, S2 Vascular:  Vessel Right Left  Radial Palpable Palpable                                    Musculoskeletal: M/S 5/5 throughout. Walks with a walker. 1+ BLE edema. Moderate to severe stasis dermatitis bilaterally.  Neurologic: CN 2-12 intact. Sensation grossly intact in extremities.  Symmetrical.  Speech is fluent. Motor exam as listed above. Psychiatric: Judgment intact, Mood & affect appropriate for pt's clinical situation. Dermatologic: No rashes or ulcers noted.  No cellulitis or open  wounds.      CBC Lab Results  Component Value Date   WBC 3.4 (L) 05/08/2016   HGB 12.6 05/08/2016   HCT 38.1 05/08/2016   MCV 91.2 05/08/2016   PLT 216 05/08/2016    BMET    Component Value Date/Time   NA 140 05/18/2016 1530   NA 133 (L) 07/27/2013 1537   K 4.4 05/18/2016 1530   K 4.2 07/27/2013 1537   CL 99 05/18/2016 1530   CL 102 07/27/2013 1537   CO2 26 05/18/2016 1530   CO2 30 07/27/2013 1537   GLUCOSE 85 05/18/2016 1530   GLUCOSE 131 (H) 05/08/2016 1035   GLUCOSE 104 (H) 07/27/2013 1537   BUN 23 05/18/2016 1530   BUN 13 07/27/2013 1537   CREATININE 0.79 05/18/2016 1530   CREATININE 0.70 07/27/2013 1537   CALCIUM 9.1 05/18/2016 1530   CALCIUM 9.1 07/27/2013 1537   GFRNONAA 71 05/18/2016 1530   GFRNONAA >60 07/27/2013 1537   GFRAA 82 05/18/2016 1530   GFRAA >60 07/27/2013 1537   CrCl cannot be calculated (Patient's most recent lab result is older than the maximum 21 days allowed.).  COAG No results found for: INR, PROTIME  Radiology No results found.    Assessment/Plan Lymphedema Swelling is stable to improved from her last visit.  Diabetes mellitus blood glucose control important in reducing the progression of atherosclerotic disease. Also, involved in wound healing. On appropriate medications.   Hypertension blood pressure control important in reducing the progression of atherosclerotic disease. On appropriate oral medications.   Carotid stenosis  Her carotid duplex today reveals stable 40-59% right ICA stenosis and 1-39% left ICA stenosis and this has not changed from her study several years ago. Continue aspirin recheck in 1 year.    Leotis Pain, MD  11/17/2016 3:39 PM    This note was created with Dragon medical transcription system.  Any errors from dictation are purely unintentional

## 2016-11-17 NOTE — Assessment & Plan Note (Signed)
Her carotid duplex today reveals stable 40-59% right ICA stenosis and 1-39% left ICA stenosis and this has not changed from her study several years ago. Continue aspirin recheck in 1 year.

## 2016-11-17 NOTE — Assessment & Plan Note (Signed)
blood pressure control important in reducing the progression of atherosclerotic disease. On appropriate oral medications.  

## 2016-11-17 NOTE — Assessment & Plan Note (Signed)
Swelling is stable to improved from her last visit.

## 2016-11-18 DIAGNOSIS — E119 Type 2 diabetes mellitus without complications: Secondary | ICD-10-CM | POA: Diagnosis not present

## 2016-11-18 DIAGNOSIS — I951 Orthostatic hypotension: Secondary | ICD-10-CM | POA: Diagnosis not present

## 2016-11-18 DIAGNOSIS — D510 Vitamin B12 deficiency anemia due to intrinsic factor deficiency: Secondary | ICD-10-CM | POA: Diagnosis not present

## 2016-11-18 DIAGNOSIS — I4891 Unspecified atrial fibrillation: Secondary | ICD-10-CM | POA: Diagnosis not present

## 2016-11-19 ENCOUNTER — Ambulatory Visit (INDEPENDENT_AMBULATORY_CARE_PROVIDER_SITE_OTHER): Payer: Medicare Other | Admitting: Family Medicine

## 2016-11-19 VITALS — BP 153/81 | HR 89 | Temp 99.0°F

## 2016-11-19 DIAGNOSIS — J432 Centrilobular emphysema: Secondary | ICD-10-CM | POA: Diagnosis not present

## 2016-11-19 DIAGNOSIS — E039 Hypothyroidism, unspecified: Secondary | ICD-10-CM

## 2016-11-19 DIAGNOSIS — I10 Essential (primary) hypertension: Secondary | ICD-10-CM

## 2016-11-19 DIAGNOSIS — Z170 Estrogen receptor positive status [ER+]: Secondary | ICD-10-CM | POA: Diagnosis not present

## 2016-11-19 DIAGNOSIS — R569 Unspecified convulsions: Secondary | ICD-10-CM

## 2016-11-19 DIAGNOSIS — I480 Paroxysmal atrial fibrillation: Secondary | ICD-10-CM

## 2016-11-19 DIAGNOSIS — E1151 Type 2 diabetes mellitus with diabetic peripheral angiopathy without gangrene: Secondary | ICD-10-CM

## 2016-11-19 DIAGNOSIS — C50912 Malignant neoplasm of unspecified site of left female breast: Secondary | ICD-10-CM

## 2016-11-19 DIAGNOSIS — Z13220 Encounter for screening for lipoid disorders: Secondary | ICD-10-CM

## 2016-11-19 DIAGNOSIS — I739 Peripheral vascular disease, unspecified: Secondary | ICD-10-CM

## 2016-11-19 DIAGNOSIS — Z7189 Other specified counseling: Secondary | ICD-10-CM | POA: Insufficient documentation

## 2016-11-19 DIAGNOSIS — Z0000 Encounter for general adult medical examination without abnormal findings: Secondary | ICD-10-CM

## 2016-11-19 MED ORDER — CLOPIDOGREL BISULFATE 75 MG PO TABS: 75.0000 mg | ORAL_TABLET | Freq: Every day | ORAL | 4 refills | Status: DC

## 2016-11-19 MED ORDER — LEVOTHYROXINE SODIUM 100 MCG PO TABS: 100.0000 ug | ORAL_TABLET | Freq: Every day | ORAL | 4 refills | Status: AC

## 2016-11-19 MED ORDER — MUPIROCIN 2 % EX OINT: 1.0000 "application " | TOPICAL_OINTMENT | Freq: Two times a day (BID) | CUTANEOUS | 2 refills | Status: DC

## 2016-11-19 MED ORDER — DIGOXIN 125 MCG PO TABS: 125.0000 ug | ORAL_TABLET | Freq: Every day | ORAL | 4 refills | Status: DC

## 2016-11-19 NOTE — Progress Notes (Signed)
BP (!) 153/81   Pulse 89   Temp 99 F (37.2 C) (Axillary)   SpO2 98%    Subjective:    Patient ID: Mia Padilla, female    DOB: 31-Dec-1934, 82 y.o.   MRN: [id number]  HPI: Mia Padilla is a 82 y.o. female  Chief Complaint  Patient presents with  . Annual Exam  Reviewed multiple medical problems all in all doing well stable on medications noted low blood sugar spells. Patient all in all doing better has some home health going on now which helps. Patient also has family help. Reviewed medications all stable. Reviewed note from nurse health advisor and stable.  Relevant past medical, surgical, family and social history reviewed and updated as indicated. Interim medical history since our last visit reviewed. Allergies and medications reviewed and updated.  Point current condition Review of Systems  Constitutional: Negative.   HENT: Negative.   Eyes: Negative.   Respiratory: Negative.   Cardiovascular: Negative.   Gastrointestinal: Negative.   Endocrine: Negative.   Genitourinary: Negative.   Musculoskeletal: Negative.   Skin: Negative.   Allergic/Immunologic: Negative.   Neurological: Negative.   Hematological: Negative.   Psychiatric/Behavioral: Negative.     Per HPI unless specifically indicated above     Objective:    BP (!) 153/81   Pulse 89   Temp 99 F (37.2 C) (Axillary)   SpO2 98%   Wt Readings from Last 3 Encounters:  11/17/16 192 lb (87.1 kg)  11/12/16 193 lb (87.5 kg)  11/10/16 190 lb 12 oz (86.5 kg)    Physical Exam  Constitutional: She is oriented to person, place, and time. She appears well-developed and well-nourished.  HENT:  Head: Normocephalic and atraumatic.  Right Ear: External ear normal.  Left Ear: External ear normal.  Nose: Nose normal.  Mouth/Throat: Oropharynx is clear and moist.  Eyes: Pupils are equal, round, and reactive to light. Conjunctivae and EOM are normal.  Neck: Normal range of motion. Neck supple. Carotid bruit is  not present.  Cardiovascular: Normal rate, regular rhythm and normal heart sounds.   No murmur heard. Pulmonary/Chest: Effort normal and breath sounds normal.  Breast exam done at surgeon's office  Abdominal: Soft. Bowel sounds are normal. There is no hepatosplenomegaly.  Musculoskeletal: Normal range of motion.  Neurological: She is alert and oriented to person, place, and time.  Skin: No rash noted.  Psychiatric: She has a normal mood and affect. Her behavior is normal. Judgment and thought content normal.    Results for orders placed or performed in visit on 08/18/16  Bayer DCA Hb A1c Waived  Result Value Ref Range   Bayer DCA Hb A1c Waived 6.2 <7.0 %      Assessment & Plan:   Problem List Items Addressed This Visit      Cardiovascular and Mediastinum   A-fib ([place])    All stable doing well.      Relevant Medications   digoxin (LANOXIN) 0.125 MG tablet   clopidogrel (PLAVIX) 75 MG tablet   Other Relevant Orders   Digoxin level   Hypertension   Relevant Medications   digoxin (LANOXIN) 0.125 MG tablet   Other Relevant Orders   CBC with Differential/Platelet   Peripheral vascular disease ([place])    Good reports from vascular just this month.      Relevant Medications   digoxin (LANOXIN) 0.125 MG tablet   clopidogrel (PLAVIX) 75 MG tablet     Respiratory   COPD (chronic obstructive  pulmonary disease) ([place])    The current medical regimen is effective;  continue present plan and medications.         Endocrine   Diabetes mellitus ([place])    The current medical regimen is effective;  continue present plan and medications.       Relevant Orders   Bayer DCA Hb A1c Waived   Comprehensive metabolic panel   Urinalysis, Routine w reflex microscopic   Hypothyroidism   Relevant Medications   levothyroxine (SYNTHROID, LEVOTHROID) 100 MCG tablet   Other Relevant Orders   TSH     Other   Seizures ([facility])    Seizures are stable. No further seizures taking medications  without problems.      Breast cancer [facility])    Doing well with good control still on anti-estrogen medications      Advanced care planning/counseling discussion    A voluntary discussion about advance care planning including the explanation and discussion of advance directives was extensively discussed  with the patient.  Explanation about the health care proxy and Living will was reviewed and packet with forms with explanation of how to fill them out was given.  Pt will bring copies.       Other Visit Diagnoses    Annual physical exam    -  Primary   Screening cholesterol level       Relevant Orders   Lipid panel       Follow up plan: Return for Hemoglobin A1c.

## 2016-11-19 NOTE — Assessment & Plan Note (Signed)
The current medical regimen is effective;  continue present plan and medications.  

## 2016-11-19 NOTE — Assessment & Plan Note (Signed)
Good reports from vascular just this month.

## 2016-11-19 NOTE — Assessment & Plan Note (Signed)
Doing well with good control still on anti-estrogen medications

## 2016-11-19 NOTE — Assessment & Plan Note (Signed)
Seizures are stable. No further seizures taking medications without problems.

## 2016-11-19 NOTE — Assessment & Plan Note (Addendum)
A voluntary discussion about advance care planning including the explanation and discussion of advance directives was extensively discussed  with the patient.  Explanation about the health care proxy and Living will was reviewed and packet with forms with explanation of how to fill them out was given.  Pt will bring copies.

## 2016-11-19 NOTE — Assessment & Plan Note (Signed)
All stable doing well.

## 2016-11-20 DIAGNOSIS — D510 Vitamin B12 deficiency anemia due to intrinsic factor deficiency: Secondary | ICD-10-CM | POA: Diagnosis not present

## 2016-11-20 DIAGNOSIS — I4891 Unspecified atrial fibrillation: Secondary | ICD-10-CM | POA: Diagnosis not present

## 2016-11-20 DIAGNOSIS — I951 Orthostatic hypotension: Secondary | ICD-10-CM | POA: Diagnosis not present

## 2016-11-20 DIAGNOSIS — E119 Type 2 diabetes mellitus without complications: Secondary | ICD-10-CM | POA: Diagnosis not present

## 2016-11-20 LAB — COMPREHENSIVE METABOLIC PANEL
ALBUMIN: 3.8 g/dL (ref 3.5–4.7)
ALT: 13 IU/L (ref 0–32)
AST: 25 IU/L (ref 0–40)
Albumin/Globulin Ratio: 0.8 — ABNORMAL LOW (ref 1.2–2.2)
Alkaline Phosphatase: 58 IU/L (ref 39–117)
BUN/Creatinine Ratio: 16 (ref 12–28)
BUN: 13 mg/dL (ref 8–27)
Bilirubin Total: 0.8 mg/dL (ref 0.0–1.2)
CO2: 22 mmol/L (ref 20–29)
CREATININE: 0.81 mg/dL (ref 0.57–1.00)
Calcium: 8.9 mg/dL (ref 8.7–10.3)
Chloride: 97 mmol/L (ref 96–106)
GFR calc Af Amer: 79 mL/min/{1.73_m2} (ref >59)
GFR calc non Af Amer: 69 mL/min/{1.73_m2} (ref >59)
GLOBULIN, TOTAL: 4.9 g/dL — AB (ref 1.5–4.5)
Glucose: 112 mg/dL — ABNORMAL HIGH (ref 65–99)
Potassium: 4.6 mmol/L (ref 3.5–5.2)
SODIUM: 136 mmol/L (ref 134–144)
TOTAL PROTEIN: 8.7 g/dL — AB (ref 6.0–8.5)

## 2016-11-20 LAB — CBC WITH DIFFERENTIAL/PLATELET
Basophils Absolute: 0 10*3/uL (ref 0.0–0.2)
Basos: 0 %
EOS (ABSOLUTE): 0 10*3/uL (ref 0.0–0.4)
Eos: 1 %
Hematocrit: 36.5 % (ref 34.0–46.6)
Hemoglobin: 11.8 g/dL (ref 11.1–15.9)
Immature Grans (Abs): 0 10*3/uL (ref 0.0–0.1)
Immature Granulocytes: 0 %
Lymphocytes Absolute: 0.5 10*3/uL — ABNORMAL LOW (ref 0.7–3.1)
Lymphs: 9 %
MCH: 30.5 pg (ref 26.6–33.0)
MCHC: 32.3 g/dL (ref 31.5–35.7)
MCV: 94 fL (ref 79–97)
Monocytes Absolute: 0.3 10*3/uL (ref 0.1–0.9)
Monocytes: 7 %
Neutrophils Absolute: 4.2 10*3/uL (ref 1.4–7.0)
Neutrophils: 83 %
Platelets: 160 10*3/uL (ref 150–379)
RBC: 3.87 x10E6/uL (ref 3.77–5.28)
RDW: 15.9 % — ABNORMAL HIGH (ref 12.3–15.4)
WBC: 5 10*3/uL (ref 3.4–10.8)

## 2016-11-20 LAB — LIPID PANEL
CHOL/HDL RATIO: 3 ratio (ref 0.0–4.4)
Cholesterol, Total: 125 mg/dL (ref 100–199)
HDL: 42 mg/dL (ref >39)
LDL CALC: 68 mg/dL (ref 0–99)
Triglycerides: 75 mg/dL (ref 0–149)
VLDL CHOLESTEROL CAL: 15 mg/dL (ref 5–40)

## 2016-11-20 LAB — DIGOXIN LEVEL: Digoxin, Serum: 0.7 ng/mL (ref 0.5–0.9)

## 2016-11-20 LAB — TSH: TSH: 7.72 u[IU]/mL — ABNORMAL HIGH (ref 0.450–4.500)

## 2016-11-22 ENCOUNTER — Emergency Department: Payer: Medicare Other

## 2016-11-22 ENCOUNTER — Encounter: Payer: Self-pay | Admitting: Radiology

## 2016-11-22 ENCOUNTER — Emergency Department
Admission: EM | Admit: 2016-11-22 | Discharge: 2016-11-22 | Disposition: A | Discharge status: Home / Self Care | Payer: Medicare Other | Attending: Emergency Medicine | Admitting: Emergency Medicine

## 2016-11-22 DIAGNOSIS — R109 Unspecified abdominal pain: Secondary | ICD-10-CM | POA: Diagnosis not present

## 2016-11-22 DIAGNOSIS — I4891 Unspecified atrial fibrillation: Secondary | ICD-10-CM | POA: Diagnosis not present

## 2016-11-22 DIAGNOSIS — Z794 Long term (current) use of insulin: Secondary | ICD-10-CM | POA: Insufficient documentation

## 2016-11-22 DIAGNOSIS — Z79899 Other long term (current) drug therapy: Secondary | ICD-10-CM | POA: Diagnosis not present

## 2016-11-22 DIAGNOSIS — Z7982 Long term (current) use of aspirin: Secondary | ICD-10-CM | POA: Insufficient documentation

## 2016-11-22 DIAGNOSIS — I10 Essential (primary) hypertension: Secondary | ICD-10-CM | POA: Insufficient documentation

## 2016-11-22 DIAGNOSIS — Z7902 Long term (current) use of antithrombotics/antiplatelets: Secondary | ICD-10-CM | POA: Insufficient documentation

## 2016-11-22 DIAGNOSIS — R079 Chest pain, unspecified: Secondary | ICD-10-CM | POA: Insufficient documentation

## 2016-11-22 DIAGNOSIS — E039 Hypothyroidism, unspecified: Secondary | ICD-10-CM | POA: Insufficient documentation

## 2016-11-22 DIAGNOSIS — J449 Chronic obstructive pulmonary disease, unspecified: Secondary | ICD-10-CM | POA: Insufficient documentation

## 2016-11-22 DIAGNOSIS — E119 Type 2 diabetes mellitus without complications: Secondary | ICD-10-CM | POA: Diagnosis not present

## 2016-11-22 DIAGNOSIS — Z8673 Personal history of transient ischemic attack (TIA), and cerebral infarction without residual deficits: Secondary | ICD-10-CM | POA: Diagnosis not present

## 2016-11-22 DIAGNOSIS — Z87891 Personal history of nicotine dependence: Secondary | ICD-10-CM | POA: Diagnosis not present

## 2016-11-22 DIAGNOSIS — R1110 Vomiting, unspecified: Secondary | ICD-10-CM | POA: Diagnosis not present

## 2016-11-22 LAB — CBC
HEMATOCRIT: 40.4 % (ref 35.0–47.0)
Hemoglobin: 13.3 g/dL (ref 12.0–16.0)
MCH: 30.4 pg (ref 26.0–34.0)
MCHC: 33 g/dL (ref 32.0–36.0)
MCV: 92 fL (ref 80.0–100.0)
PLATELETS: 113 10*3/uL — AB (ref 150–440)
RBC: 4.39 MIL/uL (ref 3.80–5.20)
RDW: 15.7 % — ABNORMAL HIGH (ref 11.5–14.5)
WBC: 4.7 10*3/uL (ref 3.6–11.0)

## 2016-11-22 LAB — COMPREHENSIVE METABOLIC PANEL
ALBUMIN: 3.3 g/dL — AB (ref 3.5–5.0)
ALT: 23 U/L (ref 14–54)
AST: 36 U/L (ref 15–41)
Alkaline Phosphatase: 62 U/L (ref 38–126)
Anion gap: 12 (ref 5–15)
BILIRUBIN TOTAL: 1.6 mg/dL — AB (ref 0.3–1.2)
BUN: 12 mg/dL (ref 6–20)
CALCIUM: 8.6 mg/dL — AB (ref 8.9–10.3)
CHLORIDE: 93 mmol/L — AB (ref 101–111)
CO2: 26 mmol/L (ref 22–32)
CREATININE: 0.63 mg/dL (ref 0.44–1.00)
GFR calc Af Amer: >60 mL/min (ref >60)
GFR calc non Af Amer: >60 mL/min (ref >60)
Glucose, Bld: 126 mg/dL — ABNORMAL HIGH (ref 65–99)
Potassium: 3.7 mmol/L (ref 3.5–5.1)
SODIUM: 131 mmol/L — AB (ref 135–145)
Total Protein: 8.9 g/dL — ABNORMAL HIGH (ref 6.5–8.1)

## 2016-11-22 LAB — LIPASE, BLOOD: LIPASE: 33 U/L (ref 11–51)

## 2016-11-22 LAB — TROPONIN I: Troponin I: <0.03 ng/mL (ref <0.03)

## 2016-11-22 MED ORDER — ONDANSETRON HCL 4 MG/2ML IJ SOLN
4.0000 mg | Freq: Once | INTRAMUSCULAR | Status: AC
  Administered 2016-11-22: 4 mg via INTRAVENOUS
  Filled 2016-11-22: qty 2

## 2016-11-22 MED ORDER — LISINOPRIL 10 MG PO TABS
10.0000 mg | ORAL_TABLET | Freq: Once | ORAL | Status: AC
  Administered 2016-11-22: 10 mg via ORAL
  Filled 2016-11-22: qty 1

## 2016-11-22 MED ORDER — IOPAMIDOL (ISOVUE-300) INJECTION 61%
30.0000 mL | Freq: Once | INTRAVENOUS | Status: AC | PRN
  Administered 2016-11-22: 30 mL via ORAL

## 2016-11-22 MED ORDER — IOPAMIDOL (ISOVUE-300) INJECTION 61%
100.0000 mL | Freq: Once | INTRAVENOUS | Status: AC | PRN
  Administered 2016-11-22: 100 mL via INTRAVENOUS

## 2016-11-22 MED ORDER — SODIUM CHLORIDE 0.9 % IV SOLN
Freq: Once | INTRAVENOUS | Status: AC
  Administered 2016-11-22: 17:00:00 via INTRAVENOUS

## 2016-11-22 MED ORDER — FAMOTIDINE 20 MG PO TABS: 20.0000 mg | ORAL_TABLET | Freq: Every day | ORAL | 1 refills | Status: DC

## 2016-11-22 MED ORDER — ACETAMINOPHEN 325 MG PO TABS
650.0000 mg | ORAL_TABLET | Freq: Once | ORAL | Status: AC
Start: 2016-11-22 — End: 2016-11-22
  Administered 2016-11-22: 650 mg via ORAL
  Filled 2016-11-22: qty 2

## 2016-11-22 NOTE — Discharge Instructions (Signed)
As we discussed please talk to your primary care doctor about obtaining a renal (kidney) protocol CT or MRI with and without contrast to better assess the lesion seen on your right kidney. Please seek medical attention for any high fevers, chest pain, shortness of breath, change in behavior, persistent vomiting, bloody stool or any other new or concerning symptoms.

## 2016-11-22 NOTE — ED Triage Notes (Signed)
Pt presents to ED via POV with c/o abdominal pain and CP w/ emesis that started Friday night. Pt reports pain is intermittent, started in the abdomen, and turned into non-radiating central CP. Pt reports 2 episodes of emesis with SHOB (pt with h/x of CHF). Pt also reports episodes of dizziness associated with the CP. Pt is A&O, in NAD, RR even, regular, and unlabored.

## 2016-11-22 NOTE — ED Provider Notes (Signed)
[facility] Emergency Department Provider Note   ____________________________________________   I have reviewed the triage vital signs and the nursing notes.   HISTORY  Chief Complaint Abdominal Pain; Chest Pain; and Emesis   History limited by: Not Limited   HPI Mia Padilla is a 82 y.o. female who presents to the emergency department today with primary concern for abdominal pain. It is located across her mid abdomen. She describes it as pain and states it is hard for her to describe it further. It started yesterday. It does come and go. It did radiate up into her chest. The patient has had associated nausea with vomiting. Non bloody emesis. The patient has had decreased appetite. The patient had a normal bowel movement yesterday. Denies any fevers. Denies any new shortness of breath.   Past Medical History:  Diagnosis Date  . Arthritis    hands  . Black head   . Bowel incontinence   . Breast cancer of upper-outer quadrant of left female breast ([place]) 2015   Left  . Carpal tunnel syndrome   . COPD (chronic obstructive pulmonary disease) ([place])   . Debilitated   . Diabetes mellitus without complication ([place])   . Glaucoma   . History of seizures   . Hyperlipidemia   . Hypertension   . Hypotension   . Obesity   . Urinary incontinence     Patient Active Problem List   Diagnosis Date Noted  . Advanced care planning/counseling discussion 11/19/2016  . Carotid stenosis 11/17/2016  . Lymphedema 02/14/2016  . Chronic venous insufficiency 02/14/2016  . Vitamin D deficiency 11/12/2015  . B12 deficiency 11/11/2015  . Peripheral vascular disease ([place]) 04/23/2015  . Osteoarthritis 04/23/2015  . Hypothyroidism 02/07/2015  . Urinary incontinence 02/07/2015  . Breast cancer ([place]) 11/15/2013  . Malignant neoplasm of upper-outer quadrant of female breast ([place]) 06/28/2013  . Hypertension 10/15/2011  . A-fib ([place]) 06/04/2011  . COPD (chronic obstructive  pulmonary disease) ([place]) 06/04/2011  . Obese 06/04/2011  . Seizures ([place]) 06/04/2011  . History of CVA (cerebrovascular accident) 06/04/2011  . Diabetes mellitus ([place]) 06/04/2011    Past Surgical History:  Procedure Laterality Date  . BOWEL RESECTION  2005  . BREAST SURGERY  08/03/13   left mastectomy  . CARPAL TUNNEL RELEASE Right 30 years   . MASTECTOMY     left   . PARTIAL HYSTERECTOMY    . TONSILLECTOMY    . UMBILICAL HERNIA REPAIR      Prior to Admission medications   Medication Sig Start Date End Date Taking? Authorizing Provider  acetaminophen (TYLENOL) 500 MG tablet Take 500 mg by mouth every 6 (six) hours as needed.   Yes [provider]  albuterol (PROVENTIL HFA;VENTOLIN HFA) 108 (90 Base) MCG/ACT inhaler Inhale 2 puffs into the lungs every 6 (six) hours as needed for wheezing or shortness of breath. 05/08/16  Yes Lisa Roca, MD  anastrozole (ARIMIDEX) 1 MG tablet Take 1 tablet (1 mg total) by mouth daily. 06/08/16  Yes Sankar, Andreas [place], MD  aspirin EC 81 MG tablet Take 81 mg by mouth daily.   Yes [provider]  benzonatate (TESSALON PERLES) 100 MG capsule Take 1 capsule (100 mg total) by mouth 3 (three) times daily as needed for cough. 05/18/16  Yes Crissman, Jeannette How, MD  brimonidine (ALPHAGAN P) 0.1 % SOLN 2 (two) times daily.    Yes [provider]  clopidogrel (PLAVIX) 75 MG tablet Take 1 tablet (75 mg total)  by mouth daily. 11/19/16  Yes Crissman, Jeannette How, MD  cyanocobalamin (,VITAMIN B-12,) 1000 MCG/ML injection Inject 1 mL (1,000 mcg total) into the muscle every 30 (thirty) days. 07/16/16  Yes Volney American, PA-C  digoxin (LANOXIN) 0.125 MG tablet Take 1 tablet (125 mcg total) by mouth daily. 11/19/16  Yes Crissman, Jeannette How, MD  dorzolamide (TRUSOPT) 2 % ophthalmic solution 1 drop 2 (two) times daily.    Yes [provider]  fluticasone (FLONASE) 50 MCG/ACT nasal spray Place 2 sprays into both nostrils daily. 10/14/15  Yes  Volney American, PA-C  Fluticasone-Salmeterol (ADVAIR) 250-50 MCG/DOSE AEPB Inhale 1 puff into the lungs every 12 (twelve) hours. 08/18/16  Yes Crissman, Jeannette How, MD  furosemide (LASIX) 20 MG tablet Take 1 tablet (20 mg total) by mouth daily as needed. 05/12/16 05/12/17 Yes Minna Merritts, MD  LEVEMIR FLEXTOUCH 100 UNIT/ML Pen INJECT 23 TO 30 UNITS PER DAY 07/16/16  Yes Volney American, PA-C  lisinopril (PRINIVIL,ZESTRIL) 10 MG tablet Take 1 tablet (10 mg total) by mouth daily. 08/18/16  Yes Crissman, Jeannette How, MD  meclizine (ANTIVERT) 25 MG tablet Take 1 tablet (25 mg total) by mouth 3 (three) times daily as needed. 05/18/16  Yes [name], MD  metFORMIN (GLUCOPHAGE) 500 MG tablet Take 1 tablet (500 mg total) by mouth 2 (two) times daily with a meal. 08/18/16  Yes Crissman, Jeannette How, MD  metoprolol succinate (TOPROL-XL) 100 MG 24 hr tablet Take 1 tablet (100 mg total) by mouth daily. 08/18/16  Yes Crissman, Jeannette How, MD  mometasone (ELOCON) 0.1 % cream Apply 1 application topically daily. Do not use day in and day out 08/27/15  Yes Crissman, Jeannette How, MD  mupirocin ointment (BACTROBAN) 2 % Apply 1 application topically 2 (two) times daily. Do not use day in and day out 11/19/16  Yes Crissman, Jeannette How, MD  nystatin (MYCOSTATIN/NYSTOP) powder APPY TO THE AFFECTED AREA 4 TIMES DAILY 02/17/16  Yes Johnson, Megan P, DO  Olopatadine HCl (PATADAY) 0.2 % SOLN Apply to eye daily.   Yes [provider]  solifenacin (VESICARE) 10 MG tablet Take 1 tablet (10 mg total) by mouth daily. 08/18/16  Yes Crissman, Jeannette How, MD  travoprost, benzalkonium, (TRAVATAN) 0.004 % ophthalmic solution Apply 1 drop to eye at bedtime.   Yes [provider]  Vitamin D, Ergocalciferol, (DRISDOL) 50000 units CAPS capsule TAKE 1 CAPSULE BY MOUTH EVERY 7 DAYS. 02/17/16  Yes Johnson, Megan P, DO  blood glucose meter kit and supplies Diagnosis:E11.9 Test glucose twice daily 11/21/14   Park Liter P, DO  glucose blood test  strip Use as instructed 11/05/14   [name], MD  levothyroxine (SYNTHROID, LEVOTHROID) 100 MCG tablet Take 1 tablet (100 mcg total) by mouth daily. 11/19/16   [name], MD    Allergies Propoxyphene; Advil [ibuprofen]; Darvocet [propoxyphene n-acetaminophen]; and Percocet [oxycodone-acetaminophen]  Family History  Problem Relation Age of Onset  . Cervical cancer Mother   . Diabetes Mother   . Glaucoma Mother   . Cervical cancer Daughter   . Hypertension Daughter   . Hypertension Father   . Hypertension Sister   . Hypertension Brother   . Hypertension Son   . Diabetes Brother   . HIV Brother   . Breast cancer Neg Hx     Social History Social History  Substance Use Topics  . Smoking status: Former Smoker    Quit date: 04/20/1974  . Smokeless tobacco: Never  Used     Comment: Quit [id number]  . Alcohol use No    Review of Systems Constitutional: No fever/chills Eyes: No visual changes. ENT: No sore throat. Cardiovascular: Denies chest pain. Respiratory: Denies shortness of breath. Gastrointestinal: Positive for abdominal pain. Positive for nausea and vomiting.  Genitourinary: Negative for dysuria. Musculoskeletal: Negative for back pain. Skin: Negative for rash. Neurological: Negative for headaches, focal weakness or numbness.  ____________________________________________   PHYSICAL EXAM:  VITAL SIGNS: ED Triage Vitals  Enc Vitals Group     BP 11/22/16 1120 (!) 153/78     Pulse Rate 11/22/16 1120 88     Resp 11/22/16 1120 20     Temp 11/22/16 1120 97.7 F (36.5 C)     Temp Source 11/22/16 1120 Oral     SpO2 11/22/16 1120 99 %     Weight 11/22/16 1121 190 lb (86.2 kg)     Height 11/22/16 1121 '5\' 4"'$  (1.626 m)     Head Circumference --      Peak Flow --      Pain Score 11/22/16 1120 6   Constitutional: Alert and oriented. Well appearing and in no distress. Eyes: Conjunctivae are normal.  ENT   Head: Normocephalic and atraumatic.   Nose: No  congestion/rhinnorhea.   Mouth/Throat: Mucous membranes are moist.   Neck: No stridor. Hematological/Lymphatic/Immunilogical: No cervical lymphadenopathy. Cardiovascular: Normal rate, regular rhythm.  No murmurs, rubs, or gallops.  Respiratory: Normal respiratory effort without tachypnea nor retractions. Breath sounds are clear and equal bilaterally. No wheezes/rales/rhonchi. Gastrointestinal: Soft and slightly tender to palpation in the upper abdomen.  No rebound. No guarding.  Genitourinary: Deferred Musculoskeletal: Normal range of motion in all extremities. No lower extremity edema. Neurologic:  Normal speech and language. No gross focal neurologic deficits are appreciated.  Skin:  Skin is warm, dry and intact. No rash noted. Psychiatric: Mood and affect are normal. Speech and behavior are normal. Patient exhibits appropriate insight and judgment.  ____________________________________________    LABS (pertinent positives/negatives)  Labs Reviewed  CBC - Abnormal; Notable for the following:       Result Value   RDW 15.7 (*)    Platelets 113 (*)    All other components within normal limits  COMPREHENSIVE METABOLIC PANEL - Abnormal; Notable for the following:    Sodium 131 (*)    Chloride 93 (*)    Glucose, Bld 126 (*)    Calcium 8.6 (*)    Total Protein 8.9 (*)    Albumin 3.3 (*)    Total Bilirubin 1.6 (*)    All other components within normal limits  TROPONIN I  LIPASE, BLOOD  URINALYSIS, COMPLETE (UACMP) WITH MICROSCOPIC  DIGOXIN LEVEL     ____________________________________________   EKG  I, Nance Pear, attending physician, personally viewed and interpreted this EKG  EKG Time: 1110 Rate: 93 Rhythm: normal sinus rhythm Axis: left axis deviation Intervals: qtc 467 QRS: narrow ST changes: no st elevation, q waves V1, V2 Impression: abnormal ekg   ____________________________________________    RADIOLOGY  CXR IMPRESSION:  Cardiomegaly  without acute disease.    Atherosclerosis.    ___________________________________________   PROCEDURES  Procedures  ____________________________________________   INITIAL IMPRESSION / ASSESSMENT AND PLAN / ED COURSE  Pertinent labs & imaging results that were available during my care of the patient were reviewed by me and considered in my medical decision making (see chart for details).  Patient presented to the emergency department today because of concerns for abdominal pain.  Workup including CT scan without any obvious acute cause. Patient's pain did improve during her stay here in the emergency department. This point given benign CT and improvement the patient is safe for discharge. On repeat abdominal exam was soft and nontender. Discussed with patient importance of primary care follow-up. Additionally discussed finding of a right renal mass importance of follow-up for that.  ____________________________________________   FINAL CLINICAL IMPRESSION(S) / ED DIAGNOSES  Final diagnoses:  Abdominal pain, unspecified abdominal location     Note: This dictation was prepared with Dragon dictation. Any transcriptional errors that result from this process are unintentional     Nance Pear, MD 11/22/16 [phone]

## 2016-11-23 ENCOUNTER — Encounter: Payer: Self-pay | Admitting: Family Medicine

## 2016-11-23 LAB — BAYER DCA HB A1C WAIVED: HB A1C: 6.1 % (ref <7.0)

## 2016-11-24 ENCOUNTER — Encounter: Payer: Self-pay | Admitting: Family Medicine

## 2016-11-24 ENCOUNTER — Ambulatory Visit (INDEPENDENT_AMBULATORY_CARE_PROVIDER_SITE_OTHER): Payer: Medicare Other | Admitting: Family Medicine

## 2016-11-24 VITALS — BP 105/63 | HR 122 | Temp 97.6°F | Wt 187.0 lb

## 2016-11-24 DIAGNOSIS — N2889 Other specified disorders of kidney and ureter: Secondary | ICD-10-CM | POA: Diagnosis not present

## 2016-11-24 DIAGNOSIS — R109 Unspecified abdominal pain: Secondary | ICD-10-CM | POA: Diagnosis not present

## 2016-11-24 DIAGNOSIS — I951 Orthostatic hypotension: Secondary | ICD-10-CM | POA: Diagnosis not present

## 2016-11-24 DIAGNOSIS — D510 Vitamin B12 deficiency anemia due to intrinsic factor deficiency: Secondary | ICD-10-CM | POA: Diagnosis not present

## 2016-11-24 DIAGNOSIS — I4891 Unspecified atrial fibrillation: Secondary | ICD-10-CM | POA: Diagnosis not present

## 2016-11-24 DIAGNOSIS — E119 Type 2 diabetes mellitus without complications: Secondary | ICD-10-CM | POA: Diagnosis not present

## 2016-11-24 MED ORDER — MOMETASONE FUROATE 0.1 % EX CREA: 1.0000 "application " | TOPICAL_CREAM | Freq: Every day | CUTANEOUS | 3 refills | Status: AC

## 2016-11-24 NOTE — Progress Notes (Signed)
BP 105/63   Pulse (!) 122   Temp 97.6 F (36.4 C)   Wt 187 lb (84.8 kg)   SpO2 96%   BMI 32.10 kg/m    Subjective:    Patient ID: Mia Padilla, female    DOB: Jul 04, 1934, 82 y.o.   MRN: [id number]  HPI: Mia Padilla is a 82 y.o. female  Chief Complaint  Patient presents with  . ED Follow Up    went to the Ed for abdominal pain. Found a renal mass. She states she is feeling alot better. Has not gotten the rx for Pepcid filled yet.   Patient presents for hospital f/u for abdominal pain. Labs and CT with no acute causes, but incidental right renal mass found. Was d/c'd with pepcid, has not started the medication yet but feels much better. No persisting abdominal complaints.   Past Medical History:  Diagnosis Date  . Arthritis    hands  . Black head   . Bowel incontinence   . Breast cancer of upper-outer quadrant of left female breast ([place]) 2015   Left  . Carpal tunnel syndrome   . COPD (chronic obstructive pulmonary disease) ([place])   . Debilitated   . Diabetes mellitus without complication ([place])   . Glaucoma   . History of seizures   . Hyperlipidemia   . Hypertension   . Hypotension   . Obesity   . Urinary incontinence    Social History   Social History  . Marital status: Divorced    Spouse name: N/A  . Number of children: N/A  . Years of education: N/A   Occupational History  . Not on file.   Social History Main Topics  . Smoking status: Former Smoker    Quit date: 04/20/1974  . Smokeless tobacco: Never Used     Comment: Quit 1976  . Alcohol use No  . Drug use: No  . Sexual activity: Not on file   Other Topics Concern  . Not on file   Social History Narrative  . No narrative on file   Relevant past medical, surgical, family and social history reviewed and updated as indicated. Interim medical history since our last visit reviewed. Allergies and medications reviewed and updated.  Review of Systems  Constitutional: Negative.   HENT: Negative.     Respiratory: Negative.   Cardiovascular: Negative.   Gastrointestinal: Negative.   Genitourinary: Negative.   Musculoskeletal: Negative.   Neurological: Negative.   Psychiatric/Behavioral: Negative.    Per HPI unless specifically indicated above     Objective:    BP 105/63   Pulse (!) 122   Temp 97.6 F (36.4 C)   Wt 187 lb (84.8 kg)   SpO2 96%   BMI 32.10 kg/m   Wt Readings from Last 3 Encounters:  11/24/16 187 lb (84.8 kg)  11/22/16 190 lb (86.2 kg)  11/17/16 192 lb (87.1 kg)    Physical Exam  Constitutional: She is oriented to person, place, and time. She appears well-developed and well-nourished.  HENT:  Head: Atraumatic.  Eyes: Pupils are equal, round, and reactive to light. Conjunctivae are normal.  Neck: Normal range of motion. Neck supple.  Cardiovascular: Normal rate.   Pulmonary/Chest: Effort normal and breath sounds normal. No respiratory distress.  Abdominal: Soft. Bowel sounds are normal. She exhibits no distension. There is no tenderness.  Musculoskeletal: Normal range of motion.  Neurological: She is alert and oriented to person, place, and time.  Skin: Skin is warm and  dry.  Psychiatric: She has a normal mood and affect. Her behavior is normal.  Nursing note and vitals reviewed.   Results for orders placed or performed during the hospital encounter of 11/22/16  CBC  Result Value Ref Range   WBC 4.7 3.6 - 11.0 K/uL   RBC 4.39 3.80 - 5.20 MIL/uL   Hemoglobin 13.3 12.0 - 16.0 g/dL   HCT 40.4 35.0 - 47.0 %   MCV 92.0 80.0 - 100.0 fL   MCH 30.4 26.0 - 34.0 pg   MCHC 33.0 32.0 - 36.0 g/dL   RDW 15.7 (H) 11.5 - 14.5 %   Platelets 113 (L) 150 - 440 K/uL  Troponin I  Result Value Ref Range   Troponin I <0.03 <0.03 ng/mL  Comprehensive metabolic panel  Result Value Ref Range   Sodium 131 (L) 135 - 145 mmol/L   Potassium 3.7 3.5 - 5.1 mmol/L   Chloride 93 (L) 101 - 111 mmol/L   CO2 26 22 - 32 mmol/L   Glucose, Bld 126 (H) 65 - 99 mg/dL   BUN 12 6  - 20 mg/dL   Creatinine, Ser 0.63 0.44 - 1.00 mg/dL   Calcium 8.6 (L) 8.9 - 10.3 mg/dL   Total Protein 8.9 (H) 6.5 - 8.1 g/dL   Albumin 3.3 (L) 3.5 - 5.0 g/dL   AST 36 15 - 41 U/L   ALT 23 14 - 54 U/L   Alkaline Phosphatase 62 38 - 126 U/L   Total Bilirubin 1.6 (H) 0.3 - 1.2 mg/dL   GFR calc non Af Amer >60 >60 mL/min   GFR calc Af Amer >60 >60 mL/min   Anion gap 12 5 - 15  Lipase, blood  Result Value Ref Range   Lipase 33 11 - 51 U/L      Assessment & Plan:   Problem List Items Addressed This Visit    None    Visit Diagnoses    Abdominal pain, unspecified abdominal location    -  Primary   Resolved, recommended starting the pepcid as a precaution. F/u if recurring.   Renal mass       U/S ordered, nephrology referral placed for further eval and management   Relevant Orders   US Renal   Ambulatory referral to Nephrology       Follow up plan: Return for as scheduled.

## 2016-11-25 DIAGNOSIS — E119 Type 2 diabetes mellitus without complications: Secondary | ICD-10-CM | POA: Diagnosis not present

## 2016-11-25 DIAGNOSIS — D510 Vitamin B12 deficiency anemia due to intrinsic factor deficiency: Secondary | ICD-10-CM | POA: Diagnosis not present

## 2016-11-25 DIAGNOSIS — I4891 Unspecified atrial fibrillation: Secondary | ICD-10-CM | POA: Diagnosis not present

## 2016-11-25 DIAGNOSIS — I951 Orthostatic hypotension: Secondary | ICD-10-CM | POA: Diagnosis not present

## 2016-11-25 NOTE — Patient Instructions (Signed)
Follow up as needed

## 2016-11-26 ENCOUNTER — Telehealth: Payer: Self-pay

## 2016-11-26 DIAGNOSIS — B351 Tinea unguium: Secondary | ICD-10-CM | POA: Diagnosis not present

## 2016-11-26 DIAGNOSIS — I4891 Unspecified atrial fibrillation: Secondary | ICD-10-CM | POA: Diagnosis not present

## 2016-11-26 DIAGNOSIS — E119 Type 2 diabetes mellitus without complications: Secondary | ICD-10-CM | POA: Diagnosis not present

## 2016-11-26 DIAGNOSIS — I951 Orthostatic hypotension: Secondary | ICD-10-CM | POA: Diagnosis not present

## 2016-11-26 DIAGNOSIS — D510 Vitamin B12 deficiency anemia due to intrinsic factor deficiency: Secondary | ICD-10-CM | POA: Diagnosis not present

## 2016-11-26 NOTE — Telephone Encounter (Signed)
Tried calling patient to notify her of her U/S appointment Tuesday 11/21/2016 at 3:00 (arrive at 2:45) at [place]. Arrive with full bladder.  Tried calling patient. Unable to leave VM.

## 2016-11-27 DIAGNOSIS — E119 Type 2 diabetes mellitus without complications: Secondary | ICD-10-CM | POA: Diagnosis not present

## 2016-11-27 DIAGNOSIS — I951 Orthostatic hypotension: Secondary | ICD-10-CM | POA: Diagnosis not present

## 2016-11-27 DIAGNOSIS — I4891 Unspecified atrial fibrillation: Secondary | ICD-10-CM | POA: Diagnosis not present

## 2016-11-27 DIAGNOSIS — D510 Vitamin B12 deficiency anemia due to intrinsic factor deficiency: Secondary | ICD-10-CM | POA: Diagnosis not present

## 2016-11-27 NOTE — Telephone Encounter (Signed)
Mia Padilla talked to patient regarding her appointment.

## 2016-11-30 DIAGNOSIS — I951 Orthostatic hypotension: Secondary | ICD-10-CM | POA: Diagnosis not present

## 2016-11-30 DIAGNOSIS — I4891 Unspecified atrial fibrillation: Secondary | ICD-10-CM | POA: Diagnosis not present

## 2016-11-30 DIAGNOSIS — E119 Type 2 diabetes mellitus without complications: Secondary | ICD-10-CM | POA: Diagnosis not present

## 2016-11-30 DIAGNOSIS — D510 Vitamin B12 deficiency anemia due to intrinsic factor deficiency: Secondary | ICD-10-CM | POA: Diagnosis not present

## 2016-12-01 ENCOUNTER — Ambulatory Visit
Admission: RE | Admit: 2016-12-01 | Discharge: 2016-12-01 | Disposition: A | Discharge status: Home / Self Care | Payer: Medicare Other | Source: Ambulatory Visit | Attending: Family Medicine | Admitting: Family Medicine

## 2016-12-01 DIAGNOSIS — N200 Calculus of kidney: Secondary | ICD-10-CM | POA: Insufficient documentation

## 2016-12-01 DIAGNOSIS — N2889 Other specified disorders of kidney and ureter: Secondary | ICD-10-CM | POA: Diagnosis not present

## 2016-12-01 DIAGNOSIS — K829 Disease of gallbladder, unspecified: Secondary | ICD-10-CM | POA: Diagnosis not present

## 2016-12-02 DIAGNOSIS — I4891 Unspecified atrial fibrillation: Secondary | ICD-10-CM | POA: Diagnosis not present

## 2016-12-02 DIAGNOSIS — E119 Type 2 diabetes mellitus without complications: Secondary | ICD-10-CM | POA: Diagnosis not present

## 2016-12-02 DIAGNOSIS — D510 Vitamin B12 deficiency anemia due to intrinsic factor deficiency: Secondary | ICD-10-CM | POA: Diagnosis not present

## 2016-12-02 DIAGNOSIS — I951 Orthostatic hypotension: Secondary | ICD-10-CM | POA: Diagnosis not present

## 2016-12-03 ENCOUNTER — Telehealth: Payer: Self-pay | Admitting: Family Medicine

## 2016-12-03 DIAGNOSIS — E119 Type 2 diabetes mellitus without complications: Secondary | ICD-10-CM | POA: Diagnosis not present

## 2016-12-03 DIAGNOSIS — D510 Vitamin B12 deficiency anemia due to intrinsic factor deficiency: Secondary | ICD-10-CM | POA: Diagnosis not present

## 2016-12-03 DIAGNOSIS — I951 Orthostatic hypotension: Secondary | ICD-10-CM | POA: Diagnosis not present

## 2016-12-03 DIAGNOSIS — I4891 Unspecified atrial fibrillation: Secondary | ICD-10-CM | POA: Diagnosis not present

## 2016-12-03 NOTE — Telephone Encounter (Signed)
Patient notified

## 2016-12-03 NOTE — Telephone Encounter (Signed)
Please let her know that her kidney ultrasound showed several cysts but no solid mass like what was indicated by the CT scan. Continue with going to the kidney doctor for further evaluation from here

## 2016-12-07 DIAGNOSIS — E119 Type 2 diabetes mellitus without complications: Secondary | ICD-10-CM | POA: Diagnosis not present

## 2016-12-07 DIAGNOSIS — I951 Orthostatic hypotension: Secondary | ICD-10-CM | POA: Diagnosis not present

## 2016-12-07 DIAGNOSIS — D510 Vitamin B12 deficiency anemia due to intrinsic factor deficiency: Secondary | ICD-10-CM | POA: Diagnosis not present

## 2016-12-07 DIAGNOSIS — I4891 Unspecified atrial fibrillation: Secondary | ICD-10-CM | POA: Diagnosis not present

## 2016-12-09 DIAGNOSIS — E119 Type 2 diabetes mellitus without complications: Secondary | ICD-10-CM | POA: Diagnosis not present

## 2016-12-09 DIAGNOSIS — D510 Vitamin B12 deficiency anemia due to intrinsic factor deficiency: Secondary | ICD-10-CM | POA: Diagnosis not present

## 2016-12-09 DIAGNOSIS — I4891 Unspecified atrial fibrillation: Secondary | ICD-10-CM | POA: Diagnosis not present

## 2016-12-09 DIAGNOSIS — I951 Orthostatic hypotension: Secondary | ICD-10-CM | POA: Diagnosis not present

## 2016-12-10 DIAGNOSIS — Z961 Presence of intraocular lens: Secondary | ICD-10-CM | POA: Diagnosis not present

## 2016-12-10 DIAGNOSIS — E119 Type 2 diabetes mellitus without complications: Secondary | ICD-10-CM | POA: Diagnosis not present

## 2016-12-10 DIAGNOSIS — I4891 Unspecified atrial fibrillation: Secondary | ICD-10-CM | POA: Diagnosis not present

## 2016-12-10 DIAGNOSIS — H2512 Age-related nuclear cataract, left eye: Secondary | ICD-10-CM | POA: Diagnosis not present

## 2016-12-10 DIAGNOSIS — I951 Orthostatic hypotension: Secondary | ICD-10-CM | POA: Diagnosis not present

## 2016-12-10 DIAGNOSIS — D510 Vitamin B12 deficiency anemia due to intrinsic factor deficiency: Secondary | ICD-10-CM | POA: Diagnosis not present

## 2016-12-10 DIAGNOSIS — H401133 Primary open-angle glaucoma, bilateral, severe stage: Secondary | ICD-10-CM | POA: Diagnosis not present

## 2016-12-11 DIAGNOSIS — E119 Type 2 diabetes mellitus without complications: Secondary | ICD-10-CM | POA: Diagnosis not present

## 2016-12-11 DIAGNOSIS — I951 Orthostatic hypotension: Secondary | ICD-10-CM | POA: Diagnosis not present

## 2016-12-11 DIAGNOSIS — D510 Vitamin B12 deficiency anemia due to intrinsic factor deficiency: Secondary | ICD-10-CM | POA: Diagnosis not present

## 2016-12-11 DIAGNOSIS — I4891 Unspecified atrial fibrillation: Secondary | ICD-10-CM | POA: Diagnosis not present

## 2016-12-14 DIAGNOSIS — I4891 Unspecified atrial fibrillation: Secondary | ICD-10-CM | POA: Diagnosis not present

## 2016-12-14 DIAGNOSIS — I951 Orthostatic hypotension: Secondary | ICD-10-CM | POA: Diagnosis not present

## 2016-12-14 DIAGNOSIS — E119 Type 2 diabetes mellitus without complications: Secondary | ICD-10-CM | POA: Diagnosis not present

## 2016-12-14 DIAGNOSIS — D510 Vitamin B12 deficiency anemia due to intrinsic factor deficiency: Secondary | ICD-10-CM | POA: Diagnosis not present

## 2016-12-15 ENCOUNTER — Telehealth: Payer: Self-pay | Admitting: Family Medicine

## 2016-12-15 DIAGNOSIS — E538 Deficiency of other specified B group vitamins: Secondary | ICD-10-CM

## 2016-12-15 NOTE — Telephone Encounter (Signed)
Message left for Mia Padilla to clarify need.

## 2016-12-15 NOTE — Telephone Encounter (Signed)
Mia Padilla called and confirmed they needed new Home Health orders. New orders sent.

## 2016-12-16 DIAGNOSIS — E119 Type 2 diabetes mellitus without complications: Secondary | ICD-10-CM | POA: Diagnosis not present

## 2016-12-16 DIAGNOSIS — I4891 Unspecified atrial fibrillation: Secondary | ICD-10-CM | POA: Diagnosis not present

## 2016-12-16 DIAGNOSIS — D510 Vitamin B12 deficiency anemia due to intrinsic factor deficiency: Secondary | ICD-10-CM | POA: Diagnosis not present

## 2016-12-16 DIAGNOSIS — I951 Orthostatic hypotension: Secondary | ICD-10-CM | POA: Diagnosis not present

## 2016-12-18 DIAGNOSIS — E119 Type 2 diabetes mellitus without complications: Secondary | ICD-10-CM | POA: Diagnosis not present

## 2016-12-18 DIAGNOSIS — I951 Orthostatic hypotension: Secondary | ICD-10-CM | POA: Diagnosis not present

## 2016-12-18 DIAGNOSIS — I4891 Unspecified atrial fibrillation: Secondary | ICD-10-CM | POA: Diagnosis not present

## 2016-12-18 DIAGNOSIS — D510 Vitamin B12 deficiency anemia due to intrinsic factor deficiency: Secondary | ICD-10-CM | POA: Diagnosis not present

## 2016-12-21 DIAGNOSIS — I4891 Unspecified atrial fibrillation: Secondary | ICD-10-CM | POA: Diagnosis not present

## 2016-12-21 DIAGNOSIS — D510 Vitamin B12 deficiency anemia due to intrinsic factor deficiency: Secondary | ICD-10-CM | POA: Diagnosis not present

## 2016-12-21 DIAGNOSIS — E119 Type 2 diabetes mellitus without complications: Secondary | ICD-10-CM | POA: Diagnosis not present

## 2016-12-21 DIAGNOSIS — I951 Orthostatic hypotension: Secondary | ICD-10-CM | POA: Diagnosis not present

## 2016-12-23 DIAGNOSIS — E119 Type 2 diabetes mellitus without complications: Secondary | ICD-10-CM | POA: Diagnosis not present

## 2016-12-23 DIAGNOSIS — D510 Vitamin B12 deficiency anemia due to intrinsic factor deficiency: Secondary | ICD-10-CM | POA: Diagnosis not present

## 2016-12-23 DIAGNOSIS — I951 Orthostatic hypotension: Secondary | ICD-10-CM | POA: Diagnosis not present

## 2016-12-23 DIAGNOSIS — I4891 Unspecified atrial fibrillation: Secondary | ICD-10-CM | POA: Diagnosis not present

## 2016-12-25 DIAGNOSIS — I951 Orthostatic hypotension: Secondary | ICD-10-CM | POA: Diagnosis not present

## 2016-12-25 DIAGNOSIS — E119 Type 2 diabetes mellitus without complications: Secondary | ICD-10-CM | POA: Diagnosis not present

## 2016-12-25 DIAGNOSIS — I4891 Unspecified atrial fibrillation: Secondary | ICD-10-CM | POA: Diagnosis not present

## 2016-12-25 DIAGNOSIS — D510 Vitamin B12 deficiency anemia due to intrinsic factor deficiency: Secondary | ICD-10-CM | POA: Diagnosis not present

## 2016-12-29 ENCOUNTER — Telehealth: Payer: Self-pay | Admitting: Family Medicine

## 2016-12-29 DIAGNOSIS — I951 Orthostatic hypotension: Secondary | ICD-10-CM | POA: Diagnosis not present

## 2016-12-29 DIAGNOSIS — I4891 Unspecified atrial fibrillation: Secondary | ICD-10-CM | POA: Diagnosis not present

## 2016-12-29 DIAGNOSIS — E119 Type 2 diabetes mellitus without complications: Secondary | ICD-10-CM | POA: Diagnosis not present

## 2016-12-29 DIAGNOSIS — D510 Vitamin B12 deficiency anemia due to intrinsic factor deficiency: Secondary | ICD-10-CM | POA: Diagnosis not present

## 2016-12-29 NOTE — Telephone Encounter (Signed)
Rosetta Posner they requested 1 more day for home nurse. Verbal order given.

## 2016-12-30 DIAGNOSIS — E119 Type 2 diabetes mellitus without complications: Secondary | ICD-10-CM | POA: Diagnosis not present

## 2016-12-30 DIAGNOSIS — I951 Orthostatic hypotension: Secondary | ICD-10-CM | POA: Diagnosis not present

## 2016-12-30 DIAGNOSIS — D510 Vitamin B12 deficiency anemia due to intrinsic factor deficiency: Secondary | ICD-10-CM | POA: Diagnosis not present

## 2016-12-30 DIAGNOSIS — I4891 Unspecified atrial fibrillation: Secondary | ICD-10-CM | POA: Diagnosis not present

## 2016-12-31 DIAGNOSIS — I4891 Unspecified atrial fibrillation: Secondary | ICD-10-CM | POA: Diagnosis not present

## 2016-12-31 DIAGNOSIS — E119 Type 2 diabetes mellitus without complications: Secondary | ICD-10-CM | POA: Diagnosis not present

## 2016-12-31 DIAGNOSIS — I951 Orthostatic hypotension: Secondary | ICD-10-CM | POA: Diagnosis not present

## 2016-12-31 DIAGNOSIS — D510 Vitamin B12 deficiency anemia due to intrinsic factor deficiency: Secondary | ICD-10-CM | POA: Diagnosis not present

## 2017-01-01 DIAGNOSIS — I4891 Unspecified atrial fibrillation: Secondary | ICD-10-CM | POA: Diagnosis not present

## 2017-01-01 DIAGNOSIS — D510 Vitamin B12 deficiency anemia due to intrinsic factor deficiency: Secondary | ICD-10-CM | POA: Diagnosis not present

## 2017-01-01 DIAGNOSIS — E119 Type 2 diabetes mellitus without complications: Secondary | ICD-10-CM | POA: Diagnosis not present

## 2017-01-01 DIAGNOSIS — I951 Orthostatic hypotension: Secondary | ICD-10-CM | POA: Diagnosis not present

## 2017-01-05 DIAGNOSIS — I951 Orthostatic hypotension: Secondary | ICD-10-CM | POA: Diagnosis not present

## 2017-01-05 DIAGNOSIS — E119 Type 2 diabetes mellitus without complications: Secondary | ICD-10-CM | POA: Diagnosis not present

## 2017-01-05 DIAGNOSIS — D510 Vitamin B12 deficiency anemia due to intrinsic factor deficiency: Secondary | ICD-10-CM | POA: Diagnosis not present

## 2017-01-05 DIAGNOSIS — I4891 Unspecified atrial fibrillation: Secondary | ICD-10-CM | POA: Diagnosis not present

## 2017-01-06 DIAGNOSIS — I951 Orthostatic hypotension: Secondary | ICD-10-CM | POA: Diagnosis not present

## 2017-01-06 DIAGNOSIS — E119 Type 2 diabetes mellitus without complications: Secondary | ICD-10-CM | POA: Diagnosis not present

## 2017-01-06 DIAGNOSIS — D510 Vitamin B12 deficiency anemia due to intrinsic factor deficiency: Secondary | ICD-10-CM | POA: Diagnosis not present

## 2017-01-06 DIAGNOSIS — I4891 Unspecified atrial fibrillation: Secondary | ICD-10-CM | POA: Diagnosis not present

## 2017-01-08 DIAGNOSIS — E119 Type 2 diabetes mellitus without complications: Secondary | ICD-10-CM | POA: Diagnosis not present

## 2017-01-08 DIAGNOSIS — I4891 Unspecified atrial fibrillation: Secondary | ICD-10-CM | POA: Diagnosis not present

## 2017-01-08 DIAGNOSIS — D510 Vitamin B12 deficiency anemia due to intrinsic factor deficiency: Secondary | ICD-10-CM | POA: Diagnosis not present

## 2017-01-08 DIAGNOSIS — I951 Orthostatic hypotension: Secondary | ICD-10-CM | POA: Diagnosis not present

## 2017-01-11 DIAGNOSIS — E119 Type 2 diabetes mellitus without complications: Secondary | ICD-10-CM | POA: Diagnosis not present

## 2017-01-11 DIAGNOSIS — D510 Vitamin B12 deficiency anemia due to intrinsic factor deficiency: Secondary | ICD-10-CM | POA: Diagnosis not present

## 2017-01-11 DIAGNOSIS — I951 Orthostatic hypotension: Secondary | ICD-10-CM | POA: Diagnosis not present

## 2017-01-11 DIAGNOSIS — I4891 Unspecified atrial fibrillation: Secondary | ICD-10-CM | POA: Diagnosis not present

## 2017-01-12 DIAGNOSIS — I4891 Unspecified atrial fibrillation: Secondary | ICD-10-CM | POA: Diagnosis not present

## 2017-01-12 DIAGNOSIS — E119 Type 2 diabetes mellitus without complications: Secondary | ICD-10-CM | POA: Diagnosis not present

## 2017-01-12 DIAGNOSIS — I951 Orthostatic hypotension: Secondary | ICD-10-CM | POA: Diagnosis not present

## 2017-01-12 DIAGNOSIS — D510 Vitamin B12 deficiency anemia due to intrinsic factor deficiency: Secondary | ICD-10-CM | POA: Diagnosis not present

## 2017-01-13 DIAGNOSIS — I4891 Unspecified atrial fibrillation: Secondary | ICD-10-CM | POA: Diagnosis not present

## 2017-01-13 DIAGNOSIS — E119 Type 2 diabetes mellitus without complications: Secondary | ICD-10-CM | POA: Diagnosis not present

## 2017-01-13 DIAGNOSIS — I951 Orthostatic hypotension: Secondary | ICD-10-CM | POA: Diagnosis not present

## 2017-01-13 DIAGNOSIS — D510 Vitamin B12 deficiency anemia due to intrinsic factor deficiency: Secondary | ICD-10-CM | POA: Diagnosis not present

## 2017-01-14 DIAGNOSIS — N2889 Other specified disorders of kidney and ureter: Secondary | ICD-10-CM | POA: Diagnosis not present

## 2017-01-14 DIAGNOSIS — R600 Localized edema: Secondary | ICD-10-CM | POA: Diagnosis not present

## 2017-01-14 DIAGNOSIS — N390 Urinary tract infection, site not specified: Secondary | ICD-10-CM | POA: Diagnosis not present

## 2017-01-15 DIAGNOSIS — D510 Vitamin B12 deficiency anemia due to intrinsic factor deficiency: Secondary | ICD-10-CM | POA: Diagnosis not present

## 2017-01-15 DIAGNOSIS — I4891 Unspecified atrial fibrillation: Secondary | ICD-10-CM | POA: Diagnosis not present

## 2017-01-15 DIAGNOSIS — I951 Orthostatic hypotension: Secondary | ICD-10-CM | POA: Diagnosis not present

## 2017-01-15 DIAGNOSIS — E119 Type 2 diabetes mellitus without complications: Secondary | ICD-10-CM | POA: Diagnosis not present

## 2017-01-18 DIAGNOSIS — I4891 Unspecified atrial fibrillation: Secondary | ICD-10-CM | POA: Diagnosis not present

## 2017-01-18 DIAGNOSIS — I951 Orthostatic hypotension: Secondary | ICD-10-CM | POA: Diagnosis not present

## 2017-01-18 DIAGNOSIS — E119 Type 2 diabetes mellitus without complications: Secondary | ICD-10-CM | POA: Diagnosis not present

## 2017-01-18 DIAGNOSIS — K265 Chronic or unspecified duodenal ulcer with perforation: Secondary | ICD-10-CM | POA: Insufficient documentation

## 2017-01-18 DIAGNOSIS — D510 Vitamin B12 deficiency anemia due to intrinsic factor deficiency: Secondary | ICD-10-CM | POA: Diagnosis not present

## 2017-01-18 HISTORY — DX: Chronic or unspecified duodenal ulcer with perforation: K26.5

## 2017-01-20 DIAGNOSIS — I951 Orthostatic hypotension: Secondary | ICD-10-CM | POA: Diagnosis not present

## 2017-01-20 DIAGNOSIS — D510 Vitamin B12 deficiency anemia due to intrinsic factor deficiency: Secondary | ICD-10-CM | POA: Diagnosis not present

## 2017-01-20 DIAGNOSIS — I4891 Unspecified atrial fibrillation: Secondary | ICD-10-CM | POA: Diagnosis not present

## 2017-01-20 DIAGNOSIS — E119 Type 2 diabetes mellitus without complications: Secondary | ICD-10-CM | POA: Diagnosis not present

## 2017-01-22 DIAGNOSIS — I951 Orthostatic hypotension: Secondary | ICD-10-CM | POA: Diagnosis not present

## 2017-01-22 DIAGNOSIS — I4891 Unspecified atrial fibrillation: Secondary | ICD-10-CM | POA: Diagnosis not present

## 2017-01-22 DIAGNOSIS — E119 Type 2 diabetes mellitus without complications: Secondary | ICD-10-CM | POA: Diagnosis not present

## 2017-01-22 DIAGNOSIS — D510 Vitamin B12 deficiency anemia due to intrinsic factor deficiency: Secondary | ICD-10-CM | POA: Diagnosis not present

## 2017-01-24 ENCOUNTER — Inpatient Hospital Stay
Admission: EM | Admit: 2017-01-24 | Discharge: 2017-01-31 | DRG: 280 | Disposition: A | Discharge status: Home Health | Payer: Medicare Other | Attending: Internal Medicine | Admitting: Internal Medicine

## 2017-01-24 ENCOUNTER — Encounter: Payer: Self-pay | Admitting: Emergency Medicine

## 2017-01-24 ENCOUNTER — Inpatient Hospital Stay (HOSPITAL_COMMUNITY)
Admit: 2017-01-24 | Discharge: 2017-01-24 | Disposition: A | Discharge status: Home / Self Care | Payer: Medicare Other | Attending: Specialist | Admitting: Specialist

## 2017-01-24 ENCOUNTER — Inpatient Hospital Stay: Payer: Medicare Other

## 2017-01-24 DIAGNOSIS — D649 Anemia, unspecified: Secondary | ICD-10-CM | POA: Diagnosis present

## 2017-01-24 DIAGNOSIS — K311 Adult hypertrophic pyloric stenosis: Secondary | ICD-10-CM | POA: Diagnosis not present

## 2017-01-24 DIAGNOSIS — R531 Weakness: Secondary | ICD-10-CM | POA: Diagnosis not present

## 2017-01-24 DIAGNOSIS — R778 Other specified abnormalities of plasma proteins: Secondary | ICD-10-CM

## 2017-01-24 DIAGNOSIS — E876 Hypokalemia: Secondary | ICD-10-CM | POA: Diagnosis not present

## 2017-01-24 DIAGNOSIS — R748 Abnormal levels of other serum enzymes: Secondary | ICD-10-CM | POA: Diagnosis not present

## 2017-01-24 DIAGNOSIS — I499 Cardiac arrhythmia, unspecified: Secondary | ICD-10-CM

## 2017-01-24 DIAGNOSIS — E119 Type 2 diabetes mellitus without complications: Secondary | ICD-10-CM | POA: Diagnosis not present

## 2017-01-24 DIAGNOSIS — Z87891 Personal history of nicotine dependence: Secondary | ICD-10-CM | POA: Diagnosis not present

## 2017-01-24 DIAGNOSIS — K261 Acute duodenal ulcer with perforation: Secondary | ICD-10-CM | POA: Diagnosis not present

## 2017-01-24 DIAGNOSIS — Z885 Allergy status to narcotic agent status: Secondary | ICD-10-CM

## 2017-01-24 DIAGNOSIS — Z83511 Family history of glaucoma: Secondary | ICD-10-CM

## 2017-01-24 DIAGNOSIS — I480 Paroxysmal atrial fibrillation: Secondary | ICD-10-CM | POA: Diagnosis present

## 2017-01-24 DIAGNOSIS — I10 Essential (primary) hypertension: Secondary | ICD-10-CM

## 2017-01-24 DIAGNOSIS — K551 Chronic vascular disorders of intestine: Secondary | ICD-10-CM

## 2017-01-24 DIAGNOSIS — M19041 Primary osteoarthritis, right hand: Secondary | ICD-10-CM | POA: Diagnosis present

## 2017-01-24 DIAGNOSIS — I110 Hypertensive heart disease with heart failure: Secondary | ICD-10-CM | POA: Diagnosis not present

## 2017-01-24 DIAGNOSIS — Z886 Allergy status to analgesic agent status: Secondary | ICD-10-CM

## 2017-01-24 DIAGNOSIS — Z853 Personal history of malignant neoplasm of breast: Secondary | ICD-10-CM

## 2017-01-24 DIAGNOSIS — Z9012 Acquired absence of left breast and nipple: Secondary | ICD-10-CM

## 2017-01-24 DIAGNOSIS — Z888 Allergy status to other drugs, medicaments and biological substances status: Secondary | ICD-10-CM

## 2017-01-24 DIAGNOSIS — Z7951 Long term (current) use of inhaled steroids: Secondary | ICD-10-CM

## 2017-01-24 DIAGNOSIS — I2510 Atherosclerotic heart disease of native coronary artery without angina pectoris: Secondary | ICD-10-CM | POA: Diagnosis present

## 2017-01-24 DIAGNOSIS — K265 Chronic or unspecified duodenal ulcer with perforation: Secondary | ICD-10-CM | POA: Diagnosis not present

## 2017-01-24 DIAGNOSIS — I5032 Chronic diastolic (congestive) heart failure: Secondary | ICD-10-CM | POA: Diagnosis not present

## 2017-01-24 DIAGNOSIS — Z8049 Family history of malignant neoplasm of other genital organs: Secondary | ICD-10-CM | POA: Diagnosis not present

## 2017-01-24 DIAGNOSIS — K269 Duodenal ulcer, unspecified as acute or chronic, without hemorrhage or perforation: Secondary | ICD-10-CM | POA: Diagnosis not present

## 2017-01-24 DIAGNOSIS — Z7984 Long term (current) use of oral hypoglycemic drugs: Secondary | ICD-10-CM

## 2017-01-24 DIAGNOSIS — I361 Nonrheumatic tricuspid (valve) insufficiency: Secondary | ICD-10-CM

## 2017-01-24 DIAGNOSIS — J449 Chronic obstructive pulmonary disease, unspecified: Secondary | ICD-10-CM | POA: Diagnosis present

## 2017-01-24 DIAGNOSIS — K631 Perforation of intestine (nontraumatic): Secondary | ICD-10-CM | POA: Diagnosis not present

## 2017-01-24 DIAGNOSIS — Z7902 Long term (current) use of antithrombotics/antiplatelets: Secondary | ICD-10-CM

## 2017-01-24 DIAGNOSIS — E785 Hyperlipidemia, unspecified: Secondary | ICD-10-CM | POA: Diagnosis present

## 2017-01-24 DIAGNOSIS — Z8249 Family history of ischemic heart disease and other diseases of the circulatory system: Secondary | ICD-10-CM | POA: Diagnosis not present

## 2017-01-24 DIAGNOSIS — R1030 Lower abdominal pain, unspecified: Secondary | ICD-10-CM

## 2017-01-24 DIAGNOSIS — I248 Other forms of acute ischemic heart disease: Secondary | ICD-10-CM | POA: Diagnosis not present

## 2017-01-24 DIAGNOSIS — Z833 Family history of diabetes mellitus: Secondary | ICD-10-CM | POA: Diagnosis not present

## 2017-01-24 DIAGNOSIS — N200 Calculus of kidney: Secondary | ICD-10-CM | POA: Diagnosis not present

## 2017-01-24 DIAGNOSIS — I214 Non-ST elevation (NSTEMI) myocardial infarction: Principal | ICD-10-CM | POA: Diagnosis present

## 2017-01-24 DIAGNOSIS — H409 Unspecified glaucoma: Secondary | ICD-10-CM | POA: Diagnosis not present

## 2017-01-24 DIAGNOSIS — I5033 Acute on chronic diastolic (congestive) heart failure: Secondary | ICD-10-CM

## 2017-01-24 DIAGNOSIS — Z79811 Long term (current) use of aromatase inhibitors: Secondary | ICD-10-CM

## 2017-01-24 DIAGNOSIS — I5043 Acute on chronic combined systolic (congestive) and diastolic (congestive) heart failure: Secondary | ICD-10-CM | POA: Diagnosis not present

## 2017-01-24 DIAGNOSIS — M19042 Primary osteoarthritis, left hand: Secondary | ICD-10-CM | POA: Diagnosis present

## 2017-01-24 DIAGNOSIS — R7989 Other specified abnormal findings of blood chemistry: Secondary | ICD-10-CM

## 2017-01-24 DIAGNOSIS — E039 Hypothyroidism, unspecified: Secondary | ICD-10-CM | POA: Diagnosis present

## 2017-01-24 HISTORY — DX: Occlusion and stenosis of unspecified carotid artery: I65.29

## 2017-01-24 LAB — COMPREHENSIVE METABOLIC PANEL
ALBUMIN: 3.6 g/dL (ref 3.5–5.0)
ALT: 13 U/L — ABNORMAL LOW (ref 14–54)
ANION GAP: 10 (ref 5–15)
AST: 26 U/L (ref 15–41)
Alkaline Phosphatase: 51 U/L (ref 38–126)
BUN: 13 mg/dL (ref 6–20)
CALCIUM: 8.8 mg/dL — AB (ref 8.9–10.3)
CO2: 29 mmol/L (ref 22–32)
CREATININE: 0.97 mg/dL (ref 0.44–1.00)
Chloride: 99 mmol/L — ABNORMAL LOW (ref 101–111)
GFR calc Af Amer: >60 mL/min (ref >60)
GFR calc non Af Amer: 54 mL/min — ABNORMAL LOW (ref >60)
GLUCOSE: 155 mg/dL — AB (ref 65–99)
Potassium: 3.4 mmol/L — ABNORMAL LOW (ref 3.5–5.1)
SODIUM: 138 mmol/L (ref 135–145)
TOTAL PROTEIN: 9.4 g/dL — AB (ref 6.5–8.1)
Total Bilirubin: 1.1 mg/dL (ref 0.3–1.2)

## 2017-01-24 LAB — URINALYSIS, COMPLETE (UACMP) WITH MICROSCOPIC
Bacteria, UA: NONE SEEN
Bilirubin Urine: NEGATIVE
Glucose, UA: NEGATIVE mg/dL
Ketones, ur: NEGATIVE mg/dL
Nitrite: NEGATIVE
Protein, ur: 30 mg/dL — AB
RBC / HPF: NONE SEEN RBC/hpf (ref 0–5)
Specific Gravity, Urine: 1.021 (ref 1.005–1.030)
Squamous Epithelial / LPF: NONE SEEN
pH: 6 (ref 5.0–8.0)

## 2017-01-24 LAB — LIPASE, BLOOD: LIPASE: 20 U/L (ref 11–51)

## 2017-01-24 LAB — HEPARIN LEVEL (UNFRACTIONATED): HEPARIN UNFRACTIONATED: 0.29 [IU]/mL — AB (ref 0.30–0.70)

## 2017-01-24 LAB — TSH: TSH: 3.197 u[IU]/mL (ref 0.350–4.500)

## 2017-01-24 LAB — ECHOCARDIOGRAM COMPLETE
HEIGHTINCHES: 67 in
WEIGHTICAEL: 3072 [oz_av]

## 2017-01-24 LAB — CBC
HEMATOCRIT: 37.3 % (ref 35.0–47.0)
Hemoglobin: 12.4 g/dL (ref 12.0–16.0)
MCH: 31.8 pg (ref 26.0–34.0)
MCHC: 33.2 g/dL (ref 32.0–36.0)
MCV: 95.7 fL (ref 80.0–100.0)
Platelets: 160 10*3/uL (ref 150–440)
RBC: 3.89 MIL/uL (ref 3.80–5.20)
RDW: 16.2 % — AB (ref 11.5–14.5)
WBC: 6.1 10*3/uL (ref 3.6–11.0)

## 2017-01-24 LAB — PROTIME-INR
INR: 1.57
Prothrombin Time: 18.6 seconds — ABNORMAL HIGH (ref 11.4–15.2)

## 2017-01-24 LAB — MAGNESIUM: MAGNESIUM: 1.2 mg/dL — AB (ref 1.7–2.4)

## 2017-01-24 LAB — DIGOXIN LEVEL: Digoxin Level: 0.3 ng/mL — ABNORMAL LOW (ref 0.8–2.0)

## 2017-01-24 LAB — GLUCOSE, CAPILLARY
Glucose-Capillary: 122 mg/dL — ABNORMAL HIGH (ref 65–99)
Glucose-Capillary: 111 mg/dL — ABNORMAL HIGH (ref 65–99)
Glucose-Capillary: 129 mg/dL — ABNORMAL HIGH (ref 65–99)
Glucose-Capillary: 95 mg/dL (ref 65–99)

## 2017-01-24 LAB — TROPONIN I
TROPONIN I: 0.05 ng/mL — AB (ref <0.03)
Troponin I: 0.04 ng/mL (ref <0.03)
Troponin I: 0.05 ng/mL (ref <0.03)
Troponin I: 0.05 ng/mL (ref <0.03)

## 2017-01-24 LAB — APTT: APTT: 27 s (ref 24–36)

## 2017-01-24 MED ORDER — CYANOCOBALAMIN 1000 MCG/ML IJ SOLN
1000.0000 ug | INTRAMUSCULAR | Status: DC
  Filled 2017-01-24: qty 1

## 2017-01-24 MED ORDER — ONDANSETRON HCL 4 MG PO TABS: 4.0000 mg | ORAL_TABLET | Freq: Four times a day (QID) | ORAL | Status: DC | PRN

## 2017-01-24 MED ORDER — HEPARIN BOLUS VIA INFUSION
1200.0000 [IU] | Freq: Once | INTRAVENOUS | Status: AC
  Administered 2017-01-24: 1200 [IU] via INTRAVENOUS
  Filled 2017-01-24: qty 1200

## 2017-01-24 MED ORDER — ANASTROZOLE 1 MG PO TABS
1.0000 mg | ORAL_TABLET | Freq: Every day | ORAL | Status: DC
  Administered 2017-01-24 – 2017-01-31 (×8): 1 mg via ORAL
  Filled 2017-01-24 (×8): qty 1

## 2017-01-24 MED ORDER — INSULIN ASPART 100 UNIT/ML ~~LOC~~ SOLN
0.0000 [IU] | Freq: Three times a day (TID) | SUBCUTANEOUS | Status: DC
  Administered 2017-01-24 (×2): 1 [IU] via SUBCUTANEOUS
  Administered 2017-01-29: 2 [IU] via SUBCUTANEOUS
  Administered 2017-01-29 – 2017-01-30 (×3): 1 [IU] via SUBCUTANEOUS
  Administered 2017-01-30: 2 [IU] via SUBCUTANEOUS
  Administered 2017-01-31: 1 [IU] via SUBCUTANEOUS
  Filled 2017-01-24 (×8): qty 1

## 2017-01-24 MED ORDER — DIGOXIN 125 MCG PO TABS
125.0000 ug | ORAL_TABLET | Freq: Every day | ORAL | Status: DC
  Administered 2017-01-24: 125 ug via ORAL
  Filled 2017-01-24: qty 1

## 2017-01-24 MED ORDER — MECLIZINE HCL 25 MG PO TABS
25.0000 mg | ORAL_TABLET | Freq: Three times a day (TID) | ORAL | Status: DC | PRN
  Filled 2017-01-24: qty 1

## 2017-01-24 MED ORDER — ALBUTEROL SULFATE (2.5 MG/3ML) 0.083% IN NEBU: 3.0000 mL | INHALATION_SOLUTION | Freq: Four times a day (QID) | RESPIRATORY_TRACT | Status: DC | PRN

## 2017-01-24 MED ORDER — LEVOTHYROXINE SODIUM 100 MCG PO TABS
100.0000 ug | ORAL_TABLET | Freq: Every day | ORAL | Status: DC
  Administered 2017-01-24 – 2017-01-31 (×8): 100 ug via ORAL
  Filled 2017-01-24 (×8): qty 1

## 2017-01-24 MED ORDER — SODIUM CHLORIDE 0.9 % IV BOLUS (SEPSIS)
500.0000 mL | INTRAVENOUS | Status: AC
  Administered 2017-01-24: 500 mL via INTRAVENOUS

## 2017-01-24 MED ORDER — ACETAMINOPHEN 325 MG PO TABS
650.0000 mg | ORAL_TABLET | Freq: Four times a day (QID) | ORAL | Status: DC | PRN
  Administered 2017-01-25 – 2017-01-28 (×2): 650 mg via ORAL
  Filled 2017-01-24 (×2): qty 2

## 2017-01-24 MED ORDER — PIPERACILLIN-TAZOBACTAM 3.375 G IVPB
3.3750 g | Freq: Three times a day (TID) | INTRAVENOUS | Status: DC
  Administered 2017-01-24 – 2017-01-29 (×16): 3.375 g via INTRAVENOUS
  Filled 2017-01-24 (×15): qty 50

## 2017-01-24 MED ORDER — INSULIN DETEMIR 100 UNIT/ML ~~LOC~~ SOLN
15.0000 [IU] | Freq: Every day | SUBCUTANEOUS | Status: DC
  Filled 2017-01-24 (×2): qty 0.15

## 2017-01-24 MED ORDER — HEPARIN (PORCINE) IN NACL 100-0.45 UNIT/ML-% IJ SOLN
950.0000 [IU]/h | INTRAMUSCULAR | Status: DC
  Administered 2017-01-24: 950 [IU]/h via INTRAVENOUS
  Filled 2017-01-24: qty 250

## 2017-01-24 MED ORDER — DOCUSATE SODIUM 100 MG PO CAPS
100.0000 mg | ORAL_CAPSULE | Freq: Two times a day (BID) | ORAL | Status: DC
  Administered 2017-01-24 – 2017-01-31 (×13): 100 mg via ORAL
  Filled 2017-01-24 (×16): qty 1

## 2017-01-24 MED ORDER — METOPROLOL SUCCINATE ER 100 MG PO TB24
100.0000 mg | ORAL_TABLET | Freq: Every day | ORAL | Status: DC
  Administered 2017-01-24 – 2017-01-31 (×8): 100 mg via ORAL
  Filled 2017-01-24 (×9): qty 1

## 2017-01-24 MED ORDER — HEPARIN SODIUM (PORCINE) 5000 UNIT/ML IJ SOLN
4000.0000 [IU] | Freq: Once | INTRAMUSCULAR | Status: AC
  Administered 2017-01-24: 4000 [IU] via INTRAVENOUS
  Filled 2017-01-24: qty 1

## 2017-01-24 MED ORDER — POTASSIUM CHLORIDE IN NACL 40-0.9 MEQ/L-% IV SOLN
INTRAVENOUS | Status: DC
  Administered 2017-01-24 – 2017-01-25 (×2): 100 mL/h via INTRAVENOUS
  Administered 2017-01-26: 50 mL/h via INTRAVENOUS
  Filled 2017-01-24 (×6): qty 1000

## 2017-01-24 MED ORDER — HEPARIN (PORCINE) IN NACL 100-0.45 UNIT/ML-% IJ SOLN
1000.0000 [IU]/h | INTRAMUSCULAR | Status: DC
  Administered 2017-01-24: 950 [IU]/h via INTRAVENOUS
  Administered 2017-01-25 – 2017-01-26 (×2): 1100 [IU]/h via INTRAVENOUS
  Administered 2017-01-27 – 2017-01-28 (×2): 1000 [IU]/h via INTRAVENOUS
  Filled 2017-01-24 (×5): qty 250

## 2017-01-24 MED ORDER — LISINOPRIL 10 MG PO TABS
10.0000 mg | ORAL_TABLET | Freq: Every day | ORAL | Status: DC
  Administered 2017-01-24: 10 mg via ORAL
  Filled 2017-01-24: qty 1

## 2017-01-24 MED ORDER — MUPIROCIN 2 % EX OINT
1.0000 "application " | TOPICAL_OINTMENT | Freq: Two times a day (BID) | CUTANEOUS | Status: DC
  Administered 2017-01-24 – 2017-01-31 (×11): 1 via TOPICAL
  Filled 2017-01-24: qty 22

## 2017-01-24 MED ORDER — NYSTATIN 100000 UNIT/GM EX POWD
Freq: Three times a day (TID) | CUTANEOUS | Status: DC
  Administered 2017-01-24 – 2017-01-31 (×22): via TOPICAL
  Filled 2017-01-24: qty 15

## 2017-01-24 MED ORDER — ACETAMINOPHEN 650 MG RE SUPP: 650.0000 mg | Freq: Four times a day (QID) | RECTAL | Status: DC | PRN

## 2017-01-24 MED ORDER — FUROSEMIDE 20 MG PO TABS: 20.0000 mg | ORAL_TABLET | Freq: Every day | ORAL | Status: DC | PRN

## 2017-01-24 MED ORDER — ASPIRIN 81 MG PO CHEW
324.0000 mg | CHEWABLE_TABLET | Freq: Once | ORAL | Status: AC
  Administered 2017-01-24: 324 mg via ORAL
  Filled 2017-01-24: qty 4

## 2017-01-24 MED ORDER — IOPAMIDOL (ISOVUE-370) INJECTION 76%
100.0000 mL | Freq: Once | INTRAVENOUS | Status: AC | PRN
  Administered 2017-01-24: 100 mL via INTRAVENOUS

## 2017-01-24 MED ORDER — ONDANSETRON HCL 4 MG/2ML IJ SOLN
4.0000 mg | Freq: Four times a day (QID) | INTRAMUSCULAR | Status: DC | PRN
  Administered 2017-01-26: 4 mg via INTRAVENOUS
  Filled 2017-01-24: qty 2

## 2017-01-24 MED ORDER — OLOPATADINE HCL 0.1 % OP SOLN
1.0000 [drp] | Freq: Two times a day (BID) | OPHTHALMIC | Status: DC
  Administered 2017-01-24 – 2017-01-31 (×15): 1 [drp] via OPHTHALMIC
  Filled 2017-01-24: qty 5

## 2017-01-24 MED ORDER — CLOPIDOGREL BISULFATE 75 MG PO TABS
75.0000 mg | ORAL_TABLET | Freq: Every day | ORAL | Status: DC
  Administered 2017-01-24: 75 mg via ORAL
  Filled 2017-01-24: qty 1

## 2017-01-24 MED ORDER — LATANOPROST 0.005 % OP SOLN
1.0000 [drp] | Freq: Every day | OPHTHALMIC | Status: DC
  Administered 2017-01-24 – 2017-01-30 (×7): 1 [drp] via OPHTHALMIC
  Filled 2017-01-24: qty 2.5

## 2017-01-24 MED ORDER — PANTOPRAZOLE SODIUM 40 MG IV SOLR
40.0000 mg | Freq: Two times a day (BID) | INTRAVENOUS | Status: DC
  Administered 2017-01-24 – 2017-01-31 (×15): 40 mg via INTRAVENOUS
  Filled 2017-01-24 (×15): qty 40

## 2017-01-24 MED ORDER — SODIUM CHLORIDE 0.9 % IV SOLN
Freq: Once | INTRAVENOUS | Status: AC
  Administered 2017-01-24: 04:00:00 via INTRAVENOUS

## 2017-01-24 MED ORDER — MOMETASONE FURO-FORMOTEROL FUM 200-5 MCG/ACT IN AERO
2.0000 | INHALATION_SPRAY | Freq: Two times a day (BID) | RESPIRATORY_TRACT | Status: DC
  Administered 2017-01-24 – 2017-01-31 (×15): 2 via RESPIRATORY_TRACT
  Filled 2017-01-24: qty 8.8

## 2017-01-24 MED ORDER — DORZOLAMIDE HCL 2 % OP SOLN
1.0000 [drp] | Freq: Two times a day (BID) | OPHTHALMIC | Status: DC
  Administered 2017-01-24 – 2017-01-31 (×15): 1 [drp] via OPHTHALMIC
  Filled 2017-01-24: qty 10

## 2017-01-24 MED ORDER — MAGNESIUM SULFATE 2 GM/50ML IV SOLN
2.0000 g | Freq: Once | INTRAVENOUS | Status: AC
  Administered 2017-01-24: 2 g via INTRAVENOUS
  Filled 2017-01-24: qty 50

## 2017-01-24 MED ORDER — BRIMONIDINE TARTRATE 0.15 % OP SOLN
1.0000 [drp] | Freq: Two times a day (BID) | OPHTHALMIC | Status: DC
  Administered 2017-01-24 – 2017-01-31 (×15): 1 [drp] via OPHTHALMIC
  Filled 2017-01-24: qty 5

## 2017-01-24 MED ORDER — AMOXICILLIN-POT CLAVULANATE 500-125 MG PO TABS: 1.0000 | ORAL_TABLET | Freq: Two times a day (BID) | ORAL | Status: DC

## 2017-01-24 NOTE — Progress Notes (Signed)
[facility] NAME: Mia Padilla    MR#:  [id number]  DATE OF BIRTH:  [birth year]  SUBJECTIVE:   Patient here due to abdominal pain and also noted to have a mildly elevated troponin. Patient CT scan assistive of perforated viscus but clinically patient does not have a surgical abdomen. Presently patient denies any worsening abdominal pain nausea vomiting.  REVIEW OF SYSTEMS:    Review of Systems  Constitutional: Negative for chills and fever.  HENT: Negative for congestion and tinnitus.   Eyes: Negative for blurred vision and double vision.  Respiratory: Negative for cough, shortness of breath and wheezing.   Cardiovascular: Negative for chest pain, orthopnea and PND.  Gastrointestinal: Negative for abdominal pain, diarrhea, nausea and vomiting.  Genitourinary: Negative for dysuria and hematuria.  Neurological: Negative for dizziness, sensory change and focal weakness.  All other systems reviewed and are negative.   Nutrition: NPO Tolerating Diet: No Tolerating PT: Await Eval.      DRUG ALLERGIES:   Allergies  Allergen Reactions  . Propoxyphene Other (See Comments)    Other Reaction: Severe Headache  . Advil [Ibuprofen] Other (See Comments)    Makes her heart race  . Darvocet [Propoxyphene N-Acetaminophen]   . Percocet [Oxycodone-Acetaminophen]     hallucination    VITALS:  Blood pressure (!) 114/46, pulse 94, temperature 98 F (36.7 C), temperature source Oral, resp. rate 18, height 5\' 7"  (1.702 m), weight 87.1 kg (192 lb), SpO2 96 %.  PHYSICAL EXAMINATION:   Physical Exam  GENERAL:  82 y.o.-year-old patient lying in bed in no acute distress.  EYES: Pupils equal, round, reactive to light and accommodation. No scleral icterus. Extraocular muscles intact.  HEENT: Head atraumatic, normocephalic. Oropharynx and nasopharynx clear.  NECK:  Supple, no jugular venous distention. No thyroid enlargement, no tenderness.   LUNGS: Normal breath sounds bilaterally, no wheezing, rales, rhonchi. No use of accessory muscles of respiration.  CARDIOVASCULAR: S1, S2 normal. No murmurs, rubs, or gallops.  ABDOMEN: Soft, nontender, nondistended. Bowel sounds present. No organomegaly or mass.  EXTREMITIES: No cyanosis, clubbing or edema b/l.    NEUROLOGIC: Cranial nerves II through XII are intact. No focal Motor or sensory deficits b/l.   PSYCHIATRIC: The patient is alert and oriented x 3.  SKIN: No obvious rash, lesion, or ulcer.    LABORATORY PANEL:   CBC  Recent Labs Lab 01/24/17 0156  WBC 6.1  HGB 12.4  HCT 37.3  PLT 160   ------------------------------------------------------------------------------------------------------------------  Chemistries   Recent Labs Lab 01/24/17 0156  NA 138  K 3.4*  CL 99*  CO2 29  GLUCOSE 155*  BUN 13  CREATININE 0.97  CALCIUM 8.8*  MG 1.2*  AST 26  ALT 13*  ALKPHOS 51  BILITOT 1.1   ------------------------------------------------------------------------------------------------------------------  Cardiac Enzymes  Recent Labs Lab 01/24/17 0739  TROPONINI 0.04*   ------------------------------------------------------------------------------------------------------------------  RADIOLOGY:  Ct Angio Abd/pel W And/or Wo Contrast  Result Date: 01/24/2017 CLINICAL DATA:  Generalized abdominal pain starting today. Vomiting. Loose bowel movements. History of ventricular a rib anemia. EXAM: CTA ABDOMEN AND PELVIS wITHOUT AND WITH CONTRAST TECHNIQUE: Multidetector CT imaging of the abdomen and pelvis was performed using the standard protocol during bolus administration of intravenous contrast. Multiplanar reconstructed images and MIPs were obtained and reviewed to evaluate the vascular anatomy. CONTRAST:  100 mL Isovue 370 COMPARISON:  11/22/2016 FINDINGS: VASCULAR Aorta: Normal caliber aorta without aneurysm, dissection, vasculitis or significant stenosis.  Diffuse aortic calcification.  Celiac: Calcification at the origin of the celiac axis but the celiac axis and branch vessels are patent without evidence of significant stenosis. SMA: Calcification at the origin of the superior mesenteric artery with scattered calcification throughout. There is focal narrowing at the origin of the superior mesenteric artery. This represents 50% or greater diameter reduction. The vessel remains patent through this area of stenosis. There is a focal filling defect in 1 of the second or third order right mesenteric branch vessels resulting and 80-90% diameter reduction focally. This could represent thrombus or atherosclerotic plaque. The vessel remains patent distal to this area. Renals: Single bilateral renal arteries are patent with calcification at the origins. IMA: Inferior mesenteric artery is patent. Inflow: Patent without evidence of aneurysm, dissection, vasculitis or significant stenosis. Proximal Outflow: Bilateral common femoral and visualized portions of the superficial and profunda femoral arteries are patent without evidence of aneurysm, dissection, vasculitis or significant stenosis. Veins: No obvious venous abnormality within the limitations of this arterial phase study. Review of the MIP images confirms the above findings. NON-VASCULAR Lower chest: Small bilateral pleural effusions. Mosaic attenuation pattern in the lung bases suggesting edema or diffuse pneumonia. Cardiac enlargement. Pectus excavatum. Hepatobiliary: The gallbladder is distended. There is suggestion of small stones in the dependent gallbladder. No bile duct dilatation. No focal liver lesions. Pancreas: Unremarkable. No pancreatic ductal dilatation or surrounding inflammatory changes. Spleen: Calcified granulomas in the spleen.  No splenomegaly. Adrenals/Urinary Tract: No adrenal gland nodules. Multiple intrarenal stones bilaterally. Largest is in the right upper pole and measures about 5 mm in diameter.  Bilateral renal cysts are incompletely characterized. No change since prior study. Low-attenuation zone in the upper pole right kidney is again demonstrated and incompletely characterized. This could be a focal defect due to infarct, infection, or mass lesion. As previously recommended on ultrasound 12/01/2016, MRI would be suggested in the elective setting for further evaluation. No hydronephrosis or hydroureter. The bladder wall is thickened and there is gas in the bladder. This suggests cystitis. There is a stone in the base of the bladder. Stomach/Bowel: Diffuse free intraperitoneal and a small amount of retroperitoneal air. Stomach is incompletely distended but there is evidence of gastric wall thickening. There is gas adjacent to the duodenum. This suggests the probability of a perforated duodenal ulcer. Small bowel and colon are not abnormally distended and no discrete areas of wall thickening or pneumatosis are demonstrated. Appendix is normal. Diverticulosis of the sigmoid colon without evidence of diverticulitis. Lymphatic: Aortic atherosclerosis. No enlarged abdominal or pelvic lymph nodes. Reproductive: Status post hysterectomy. No adnexal masses. Other: Surgical sutures in the anterior abdominal wall. Musculoskeletal: Degenerative changes diffusely throughout the lumbar spine. Severe degenerative changes in the left hip. No destructive bone lesions. IMPRESSION: VASCULAR 1. Diffuse aortic and vascular calcifications. 2. Stenosis at the origin of the celiac axis and superior mesenteric artery due to calcific stenosis. 3. Focal filling defect in a mid mesenteric SMA branch may represent thrombus or atherosclerotic plaque formation. NON-VASCULAR 1. Free intraperitoneal air and a small amount of retroperitoneal air. Suggestion of gastric wall thickening with tear adjacent to the duodenum. High suspicion for perforated duodenal ulcer. No definite findings to suggest bowel ischemia. 2. Small bilateral pleural  effusions. Airspace disease in the lungs probably represents edema. 3. Distended gallbladder with small stones. 4. Multiple bilateral nonobstructing intrarenal stones. 5. Hypoenhancing zone in the upper pole right kidney is unchanged since previous study. Differential diagnosis would include infarct, infection, or mass. As recommended on prior ultrasound,  elective MRI is recommended for further evaluation. 6. Bladder wall thickening with gas in the bladder suggesting cystitis unless there has been instrumentation. Small bladder stone. 7. Calcified splenic granulomas. These results were called by telephone at the time of interpretation on 01/24/2017 at 6:24 am to Dr. Hinda Kehr , who verbally acknowledged these results. Electronically Signed   By: Lucienne Capers M.D.   On: 01/24/2017 06:28     ASSESSMENT AND PLAN:   82 year old female with past medical history of hypertension, hyperlipidemia, diabetes, COPD, coronary artery stenosis with history of breast cancer presented to the hospital due to abdominal pain and noted to have a perforated viscus on CT scan. Patient was also noted to have a mildly elevated troponin.  1. Abdominal pain-secondary to a perforated viscus is seen on CT scan of the abdomen and pelvis. Patient clinically although is symptomatically with no worsening abdominal pain presently, nausea, vomiting. -Seen by general surgery and no plans for acute intervention given her comorbidities and advanced age and as patient is clinically asymptomatic. -Continue supportive care with IV antibiotics and pain control for now.  2. Elevated troponin-secondary to supply demand ischemia, no evidence of acute coronary syndrome. -Troponins did not trend upwards. Await echocardiogram results. Continue empiric heparin drip, continue atorvastatin, , metoprolol, appreciate cardiology input.  3. 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 A  vascular surgery consult obtained. They recommended continuing heparin, Plavix. No plans for any angina, further intervention. Patient is clinically asymptomatic.  4. Hypothyroidism-continue Synthroid. Next  5. Diabetes type 2 without compensation-continue Levemir, sliding scale insulin.  6. Glaucoma-continue dorzolamide, latanoprost eyedrops.  7. COPD-no acute exacerbation. Continue Dulera  8. Hx of Breast Cancer - cont. Arimidex.   Discussed plan of care with daughter over the phone.   All the records are reviewed and case discussed with Care Management/Social Worker. Management plans discussed with the patient, family and they are in agreement.  CODE STATUS: Full code  DVT Prophylaxis: Hep. gtt  TOTAL TIME TAKING CARE OF THIS PATIENT: 30 minutes.   POSSIBLE D/C IN 1-2 DAYS, DEPENDING ON CLINICAL CONDITION.   Henreitta Leber M.D on 01/24/2017 at 1:33 PM  Between 7am to 6pm - Pager - [phone]  After 6pm go to [email or web address] - Proofreader  [facility]  Office  [phone]  CC: Primary care physician; [name], MD

## 2017-01-24 NOTE — Progress Notes (Signed)
[facility] for heparin drip Indication: chest pain/ACS/NSTEMI  Allergies  Allergen Reactions  . Propoxyphene Other (See Comments)    Other Reaction: Severe Headache  . Advil [Ibuprofen] Other (See Comments)    Makes her heart race  . Darvocet [Propoxyphene N-Acetaminophen]   . Percocet [Oxycodone-Acetaminophen]     hallucination    Patient Measurements: Height: 5\' 7"  (170.2 cm) Weight: 192 lb (87.1 kg) IBW/kg (Calculated) : 61.6 Heparin Dosing Weight: 80 kg  Vital Signs: Temp: 97.8 F (36.6 C) (10/07 0623) Temp Source: Oral (10/07 0623) BP: 153/70 (10/07 0623) Pulse Rate: 103 (10/07 0623)  Labs:  Recent Labs  01/24/17 0156 01/24/17 0739  HGB 12.4  --   HCT 37.3  --   PLT 160  --   APTT 27  --   LABPROT 18.6*  --   INR 1.57  --   CREATININE 0.97  --   TROPONINI 0.05* 0.04*    Estimated Creatinine Clearance: 52.4 mL/min (by C-G formula based on SCr of 0.97 mg/dL).  Medications:  No anticoagulation in PTA meds.  Assessment: 82 yo female started on heparin drip for NSTEM was stopped, now being restarted. Possible mesenteric artery thrombus/stenosis noted on CT scan as well.  Goal of Therapy:  Heparin level 0.3-0.7 units/ml Monitor platelets by anticoagulation protocol: Yes   Plan:  Restart heparin drip at 950 units/hr (=9.5 ml/hr). First heparin level 8 hours after restart of infusion. CBC in AM  Mia Padilla L 01/24/2017,10:43 AM

## 2017-01-24 NOTE — ED Triage Notes (Signed)
Patient with complaint of generalized intermittent abdominal pain that started yesterday. Patient states that she has vomited times two today.

## 2017-01-24 NOTE — Progress Notes (Signed)
*  PRELIMINARY RESULTS* Echocardiogram 2D Echocardiogram has been performed.  Mia Padilla 01/24/2017, 2:41 PM

## 2017-01-24 NOTE — Progress Notes (Addendum)
ANTICOAGULATION CONSULT NOTE - Initial Consult  Pharmacy Consult for heparin drip Indication: chest pain/ACS/NSTEMI  Allergies  Allergen Reactions  . Propoxyphene Other (See Comments)    Other Reaction: Severe Headache  . Advil [Ibuprofen] Other (See Comments)    Makes her heart race  . Darvocet [Propoxyphene N-Acetaminophen]   . Percocet [Oxycodone-Acetaminophen]     hallucination    Patient Measurements: Height: 5\' 7"  (170.2 cm) Weight: 192 lb (87.1 kg) IBW/kg (Calculated) : 61.6 Heparin Dosing Weight: 80 kg  Vital Signs: Temp: 98 F (36.7 C) (10/07 0129) Temp Source: Oral (10/07 0129) BP: 118/67 (10/07 0327) Pulse Rate: 100 (10/07 0327)  Labs:  Recent Labs  01/24/17 0156  HGB 12.4  HCT 37.3  PLT 160  LABPROT 18.6*  INR 1.57  CREATININE 0.97  TROPONINI 0.05*    Estimated Creatinine Clearance: 52.4 mL/min (by C-G formula based on SCr of 0.97 mg/dL).   Medical History: Past Medical History:  Diagnosis Date  . Arthritis    hands  . Black head   . Bowel incontinence   . Breast cancer of upper-outer quadrant of left female breast ([place]) 2015   Left  . Carpal tunnel syndrome   . CHF (congestive heart failure) ([facility])    Pt. states dx about 5-10 years ago  . COPD (chronic obstructive pulmonary disease) ([place])   . Debilitated   . Diabetes mellitus without complication ([place])   . Glaucoma   . History of seizures   . Hyperlipidemia   . Hypertension   . Hypotension   . Obesity   . Urinary incontinence     Medications:  No anticoagulation in PTA meds.  Assessment:  Goal of Therapy:  Heparin level 0.3-0.7 units/ml Monitor platelets by anticoagulation protocol: Yes   Plan:  4000 unit bolus ordered by EDP. 950 units/hr ordered as initial rate. First heparin level 8 hours after start of infusion.  Moe Graca S 01/24/2017,4:19 AM

## 2017-01-24 NOTE — ED Notes (Signed)
Pt. States generalized abdominal pain that started yesterday.  Pt. States she vomited clear liquid twice.  Pt. States 3 loose bowel movements in the past 24 hrs. Pt. States relief after bowel movements.

## 2017-01-24 NOTE — ED Notes (Addendum)
1st call floor RN Alisa unavailable

## 2017-01-24 NOTE — H&P (Addendum)
Mia Padilla is an 82 y.o. female.   Chief Complaint: Abdominal pain HPI: The patient with past medical history of COPD: CAD, diabetes, glaucoma and CHF presents emergency department complaining of abdominal pain. This is acute on chronic in nature. The patient has been concerned that she has an umbilical hernia. She also had 1 episode of nonbloody nonbilious emesis today. The patient's physical exam was unremarkable but telemetry showed new ST depressions as well as multiple PVCs. Due to abdominal pain the patient also underwent a CTA of the abdomen which did reveal a small mesenteric clot. Also, laboratory evaluation revealed mildly elevated troponin. She denies chest pain, shortness of breath or diaphoresis. The patient was placed on a heparin drip in the hospitalist service was called for admission.  Past Medical History:  Diagnosis Date  . Arthritis    hands  . Black head   . Bowel incontinence   . Breast cancer of upper-outer quadrant of left female breast ([facility]) 2015   Left  . Carpal tunnel syndrome   . CHF (congestive heart failure) ([facility])    Pt. states dx about 5-10 years ago  . COPD (chronic obstructive pulmonary disease) ([facility])   . Debilitated   . Diabetes mellitus without complication ([facility])   . Glaucoma   . History of seizures   . Hyperlipidemia   . Hypertension   . Hypotension   . Obesity   . Urinary incontinence     Past Surgical History:  Procedure Laterality Date  . BOWEL RESECTION  2005  . BREAST SURGERY  08/03/13   left mastectomy  . CARPAL TUNNEL RELEASE Right 30 years   . MASTECTOMY     left   . PARTIAL HYSTERECTOMY    . TONSILLECTOMY    . UMBILICAL HERNIA REPAIR      Family History  Problem Relation Age of Onset  . Cervical cancer Mother   . Diabetes Mother   . Glaucoma Mother   . Cervical cancer Daughter   . Hypertension Daughter   . Hypertension Father   . Hypertension Sister   . Hypertension Brother   . Hypertension Son   . Diabetes Brother   .  HIV Brother   . Breast cancer Neg Hx    Social History:  reports that she quit smoking about 42 years ago. She has never used smokeless tobacco. She reports that she does not drink alcohol or use drugs.  Allergies:  Allergies  Allergen Reactions  . Propoxyphene Other (See Comments)    Other Reaction: Severe Headache  . Advil [Ibuprofen] Other (See Comments)    Makes her heart race  . Darvocet [Propoxyphene N-Acetaminophen]   . Percocet [Oxycodone-Acetaminophen]     hallucination    Medications Prior to Admission  Medication Sig Dispense Refill  . acetaminophen (TYLENOL) 500 MG tablet Take 500 mg by mouth every 6 (six) hours as needed.    Marland Kitchen albuterol (PROVENTIL HFA;VENTOLIN HFA) 108 (90 Base) MCG/ACT inhaler Inhale 2 puffs into the lungs every 6 (six) hours as needed for wheezing or shortness of breath. 1 Inhaler 0  . amoxicillin-clavulanate (AUGMENTIN) 500-125 MG tablet Take 1 tablet by mouth 2 (two) times daily.    Marland Kitchen anastrozole (ARIMIDEX) 1 MG tablet Take 1 tablet (1 mg total) by mouth daily. 90 tablet 4  . brimonidine (ALPHAGAN P) 0.1 % SOLN 2 (two) times daily.     . clopidogrel (PLAVIX) 75 MG tablet Take 1 tablet (75 mg total) by mouth daily. 90 tablet 4  .  cyanocobalamin (,VITAMIN B-12,) 1000 MCG/ML injection Inject 1 mL (1,000 mcg total) into the muscle every 30 (thirty) days. 1 mL 12  . digoxin (LANOXIN) 0.125 MG tablet Take 1 tablet (125 mcg total) by mouth daily. 90 tablet 4  . dorzolamide (TRUSOPT) 2 % ophthalmic solution 1 drop 2 (two) times daily.     . Fluticasone-Salmeterol (ADVAIR) 250-50 MCG/DOSE AEPB Inhale 1 puff into the lungs every 12 (twelve) hours. 180 each 4  . furosemide (LASIX) 20 MG tablet Take 1 tablet (20 mg total) by mouth daily as needed. 30 tablet 6  . latanoprost (XALATAN) 0.005 % ophthalmic solution Place 1 drop into both eyes at bedtime.    Marland Kitchen LEVEMIR FLEXTOUCH 100 UNIT/ML Pen INJECT 23 TO 30 UNITS PER DAY (Patient taking differently: Inject 26 Units  into the skin daily at 10 pm. ) 15 mL 11  . levothyroxine (SYNTHROID, LEVOTHROID) 100 MCG tablet Take 1 tablet (100 mcg total) by mouth daily. 90 tablet 4  . lisinopril (PRINIVIL,ZESTRIL) 10 MG tablet Take 1 tablet (10 mg total) by mouth daily. 90 tablet 4  . meclizine (ANTIVERT) 25 MG tablet Take 1 tablet (25 mg total) by mouth 3 (three) times daily as needed. 90 tablet 1  . metFORMIN (GLUCOPHAGE) 500 MG tablet Take 1 tablet (500 mg total) by mouth 2 (two) times daily with a meal. (Patient taking differently: Take 500 mg by mouth daily with breakfast. ) 180 tablet 4  . metoprolol succinate (TOPROL-XL) 100 MG 24 hr tablet Take 1 tablet (100 mg total) by mouth daily. 90 tablet 4  . mometasone (ELOCON) 0.1 % cream Apply 1 application topically daily. Apply 1-2 times daily for no longer than 2 weeks at a time 50 g 3  . mupirocin ointment (BACTROBAN) 2 % Apply 1 application topically 2 (two) times daily. Do not use day in and day out 22 g 2  . nystatin (MYCOSTATIN/NYSTOP) powder APPY TO THE AFFECTED AREA 4 TIMES DAILY 45 g 1  . Olopatadine HCl (PATADAY) 0.2 % SOLN Apply 1 drop to eye daily.     . solifenacin (VESICARE) 10 MG tablet Take 1 tablet (10 mg total) by mouth daily. 90 tablet 2  . Vitamin D, Ergocalciferol, (DRISDOL) 50000 units CAPS capsule TAKE 1 CAPSULE BY MOUTH EVERY 7 DAYS. 12 capsule 0  . benzonatate (TESSALON PERLES) 100 MG capsule Take 1 capsule (100 mg total) by mouth 3 (three) times daily as needed for cough. (Patient not taking: Reported on 01/24/2017) 20 capsule 2  . blood glucose meter kit and supplies Diagnosis:E11.9 Test glucose twice daily 1 each 12  . famotidine (PEPCID) 20 MG tablet Take 1 tablet (20 mg total) by mouth daily. (Patient not taking: Reported on 11/24/2016) 30 tablet 1  . fluticasone (FLONASE) 50 MCG/ACT nasal spray Place 2 sprays into both nostrils daily. (Patient not taking: Reported on 01/24/2017) 16 g 12  . glucose blood test strip Use as instructed 100 each 12     Results for orders placed or performed during the hospital encounter of 01/24/17 (from the past 48 hour(s))  Lipase, blood     Status: None   Collection Time: 01/24/17  1:56 AM  Result Value Ref Range   Lipase 20 11 - 51 U/L  Comprehensive metabolic panel     Status: Abnormal   Collection Time: 01/24/17  1:56 AM  Result Value Ref Range   Sodium 138 135 - 145 mmol/L   Potassium 3.4 (L) 3.5 - 5.1 mmol/L  Chloride 99 (L) 101 - 111 mmol/L   CO2 29 22 - 32 mmol/L   Glucose, Bld 155 (H) 65 - 99 mg/dL   BUN 13 6 - 20 mg/dL   Creatinine, Ser 8.00 0.44 - 1.00 mg/dL   Calcium 8.8 (L) 8.9 - 10.3 mg/dL   Total Protein 9.4 (H) 6.5 - 8.1 g/dL   Albumin 3.6 3.5 - 5.0 g/dL   AST 26 15 - 41 U/L   ALT 13 (L) 14 - 54 U/L   Alkaline Phosphatase 51 38 - 126 U/L   Total Bilirubin 1.1 0.3 - 1.2 mg/dL   GFR calc non Af Amer 54 (L) >60 mL/min   GFR calc Af Amer >60 >60 mL/min    Comment: (NOTE) The eGFR has been calculated using the CKD EPI equation. This calculation has not been validated in all clinical situations. eGFR's persistently <60 mL/min signify possible Chronic Kidney Disease.    Anion gap 10 5 - 15  CBC     Status: Abnormal   Collection Time: 01/24/17  1:56 AM  Result Value Ref Range   WBC 6.1 3.6 - 11.0 K/uL   RBC 3.89 3.80 - 5.20 MIL/uL   Hemoglobin 12.4 12.0 - 16.0 g/dL   HCT 12.3 93.5 - 94.0 %   MCV 95.7 80.0 - 100.0 fL   MCH 31.8 26.0 - 34.0 pg   MCHC 33.2 32.0 - 36.0 g/dL   RDW 90.5 (H) 02.5 - 61.5 %   Platelets 160 150 - 440 K/uL  Protime-INR     Status: Abnormal   Collection Time: 01/24/17  1:56 AM  Result Value Ref Range   Prothrombin Time 18.6 (H) 11.4 - 15.2 seconds   INR 1.57   Magnesium     Status: Abnormal   Collection Time: 01/24/17  1:56 AM  Result Value Ref Range   Magnesium 1.2 (L) 1.7 - 2.4 mg/dL  Troponin I     Status: Abnormal   Collection Time: 01/24/17  1:56 AM  Result Value Ref Range   Troponin I 0.05 (HH) <0.03 ng/mL    Comment: CRITICAL  RESULT CALLED TO, READ BACK BY AND VERIFIED WITH MATTHEW MARTIN AT 0238 01/24/17.PMH  APTT     Status: None   Collection Time: 01/24/17  1:56 AM  Result Value Ref Range   aPTT 27 24 - 36 seconds   Ct Angio Abd/pel W And/or Wo Contrast  Result Date: 01/24/2017 CLINICAL DATA:  Generalized abdominal pain starting today. Vomiting. Loose bowel movements. History of ventricular a rib anemia. EXAM: CTA ABDOMEN AND PELVIS wITHOUT AND WITH CONTRAST TECHNIQUE: Multidetector CT imaging of the abdomen and pelvis was performed using the standard protocol during bolus administration of intravenous contrast. Multiplanar reconstructed images and MIPs were obtained and reviewed to evaluate the vascular anatomy. CONTRAST:  100 mL Isovue 370 COMPARISON:  11/22/2016 FINDINGS: VASCULAR Aorta: Normal caliber aorta without aneurysm, dissection, vasculitis or significant stenosis. Diffuse aortic calcification. Celiac: Calcification at the origin of the celiac axis but the celiac axis and branch vessels are patent without evidence of significant stenosis. SMA: Calcification at the origin of the superior mesenteric artery with scattered calcification throughout. There is focal narrowing at the origin of the superior mesenteric artery. This represents 50% or greater diameter reduction. The vessel remains patent through this area of stenosis. There is a focal filling defect in 1 of the second or third order right mesenteric branch vessels resulting and 80-90% diameter reduction focally. This could represent thrombus or  atherosclerotic plaque. The vessel remains patent distal to this area. Renals: Single bilateral renal arteries are patent with calcification at the origins. IMA: Inferior mesenteric artery is patent. Inflow: Patent without evidence of aneurysm, dissection, vasculitis or significant stenosis. Proximal Outflow: Bilateral common femoral and visualized portions of the superficial and profunda femoral arteries are patent  without evidence of aneurysm, dissection, vasculitis or significant stenosis. Veins: No obvious venous abnormality within the limitations of this arterial phase study. Review of the MIP images confirms the above findings. NON-VASCULAR Lower chest: Small bilateral pleural effusions. Mosaic attenuation pattern in the lung bases suggesting edema or diffuse pneumonia. Cardiac enlargement. Pectus excavatum. Hepatobiliary: The gallbladder is distended. There is suggestion of small stones in the dependent gallbladder. No bile duct dilatation. No focal liver lesions. Pancreas: Unremarkable. No pancreatic ductal dilatation or surrounding inflammatory changes. Spleen: Calcified granulomas in the spleen.  No splenomegaly. Adrenals/Urinary Tract: No adrenal gland nodules. Multiple intrarenal stones bilaterally. Largest is in the right upper pole and measures about 5 mm in diameter. Bilateral renal cysts are incompletely characterized. No change since prior study. Low-attenuation zone in the upper pole right kidney is again demonstrated and incompletely characterized. This could be a focal defect due to infarct, infection, or mass lesion. As previously recommended on ultrasound 12/01/2016, MRI would be suggested in the elective setting for further evaluation. No hydronephrosis or hydroureter. The bladder wall is thickened and there is gas in the bladder. This suggests cystitis. There is a stone in the base of the bladder. Stomach/Bowel: Diffuse free intraperitoneal and a small amount of retroperitoneal air. Stomach is incompletely distended but there is evidence of gastric wall thickening. There is gas adjacent to the duodenum. This suggests the probability of a perforated duodenal ulcer. Small bowel and colon are not abnormally distended and no discrete areas of wall thickening or pneumatosis are demonstrated. Appendix is normal. Diverticulosis of the sigmoid colon without evidence of diverticulitis. Lymphatic: Aortic  atherosclerosis. No enlarged abdominal or pelvic lymph nodes. Reproductive: Status post hysterectomy. No adnexal masses. Other: Surgical sutures in the anterior abdominal wall. Musculoskeletal: Degenerative changes diffusely throughout the lumbar spine. Severe degenerative changes in the left hip. No destructive bone lesions. IMPRESSION: VASCULAR 1. Diffuse aortic and vascular calcifications. 2. Stenosis at the origin of the celiac axis and superior mesenteric artery due to calcific stenosis. 3. Focal filling defect in a mid mesenteric SMA branch may represent thrombus or atherosclerotic plaque formation. NON-VASCULAR 1. Free intraperitoneal air and a small amount of retroperitoneal air. Suggestion of gastric wall thickening with tear adjacent to the duodenum. High suspicion for perforated duodenal ulcer. No definite findings to suggest bowel ischemia. 2. Small bilateral pleural effusions. Airspace disease in the lungs probably represents edema. 3. Distended gallbladder with small stones. 4. Multiple bilateral nonobstructing intrarenal stones. 5. Hypoenhancing zone in the upper pole right kidney is unchanged since previous study. Differential diagnosis would include infarct, infection, or mass. As recommended on prior ultrasound, elective MRI is recommended for further evaluation. 6. Bladder wall thickening with gas in the bladder suggesting cystitis unless there has been instrumentation. Small bladder stone. 7. Calcified splenic granulomas. These results were called by telephone at the time of interpretation on 01/24/2017 at 6:24 am to Dr. Hinda Kehr , who verbally acknowledged these results. Electronically Signed   By: Lucienne Capers M.D.   On: 01/24/2017 06:28    Review of Systems  Constitutional: Negative for chills and fever.  HENT: Negative for sore throat and tinnitus.   Eyes:  Negative for blurred vision and redness.  Respiratory: Negative for cough and shortness of breath.   Cardiovascular:  Negative for chest pain, palpitations, orthopnea and PND.  Gastrointestinal: Positive for abdominal pain. Negative for diarrhea, nausea and vomiting.  Genitourinary: Negative for dysuria, frequency and urgency.  Musculoskeletal: Negative for joint pain and myalgias.  Skin: Negative for rash.       No lesions  Neurological: Negative for speech change, focal weakness and weakness.  Endo/Heme/Allergies: Does not bruise/bleed easily.       No temperature intolerance  Psychiatric/Behavioral: Negative for depression and suicidal ideas.    Blood pressure (!) 153/70, pulse (!) 103, temperature 97.8 F (36.6 C), temperature source Oral, resp. rate 18, height '5\' 7"'$  (1.702 m), weight 87.1 kg (192 lb), SpO2 98 %. Physical Exam  Vitals reviewed. Constitutional: She is oriented to person, place, and time. She appears well-developed and well-nourished. No distress.  HENT:  Head: Normocephalic and atraumatic.  Mouth/Throat: Oropharynx is clear and moist.  Eyes: Pupils are equal, round, and reactive to light. Conjunctivae and EOM are normal. No scleral icterus.  Neck: Normal range of motion. Neck supple. No JVD present. No tracheal deviation present. No thyromegaly present.  Cardiovascular: Normal rate, regular rhythm and normal heart sounds.  Exam reveals no gallop and no friction rub.   No murmur heard. Respiratory: Effort normal and breath sounds normal.  GI: Soft. Bowel sounds are normal. She exhibits no distension. There is no tenderness.  Genitourinary:  Genitourinary Comments: Deferred  Musculoskeletal: Normal range of motion. She exhibits edema.  Lymphadenopathy:    She has no cervical adenopathy.  Neurological: She is alert and oriented to person, place, and time. No cranial nerve deficit. She exhibits normal muscle tone.  Skin: Skin is warm and dry. No rash noted. No erythema.  Psychiatric: She has a normal mood and affect. Her behavior is normal. Judgment and thought content normal.      Assessment/Plan This is an 82 year old female admitted for NSTEMI. 1. NSTEMI: ST changes with mild elevation in troponin. The patient was started on a heparin drip in the emergency department. Consult cardiology. Continue to follow biomarkers. Monitor telemetry 2. Bowel perforation: Free air seen on CT scan as well as small clot to mesenteric vasculature and incidental solid lesion within the right kidney. Surgery has been consulted. I started the patient on Zosyn. 3. Hypertension: Stable; continue lisinopril and metoprolol 4. CHF: Presumably diastolic; chronic. Continue digoxin and Lasix 5. CAD: Stable; continue aspirin and Plavix 6. COPD: Stable; continue inhaled corticosteroid. Albuterol as needed 7. Diabetes mellitus type 2: Hold oral hypoglycemic agents. Sliding scale insulin while hospitalized. Continue basal insulin adjusted for Hospital diet 8. Glaucoma: Open angle; continue all eyedrops 7. Hypothyroidism: Check TSH; continue Synthroid 8. History of breast cancer: Continue Arimidex 9. DVT prophylaxis: SCDs for now. I have stopped heparin drip due to potential surgical intervention. 10. GI prophylaxis: Pantoprazole The patient is a full code. Time spent on admission orders and patient care approximately 45 minutes  Harrie Foreman, MD 01/24/2017, 6:32 AM

## 2017-01-24 NOTE — Progress Notes (Signed)
[facility] for heparin drip Indication: chest pain/ACS/NSTEMI  Allergies  Allergen Reactions  . Propoxyphene Other (See Comments)    Other Reaction: Severe Headache  . Advil [Ibuprofen] Other (See Comments)    Makes her heart race  . Darvocet [Propoxyphene N-Acetaminophen]   . Percocet [Oxycodone-Acetaminophen]     hallucination    Patient Measurements: Height: 5\' 7"  (170.2 cm) Weight: 192 lb (87.1 kg) IBW/kg (Calculated) : 61.6 Heparin Dosing Weight: 80 kg  Vital Signs: Temp: 97.8 F (36.6 C) (10/07 1939) Temp Source: Oral (10/07 1208) BP: 112/51 (10/07 1939) Pulse Rate: 86 (10/07 1939)  Labs:  Recent Labs  01/24/17 0156 01/24/17 0739 01/24/17 1326 01/24/17 1957  HGB 12.4  --   --   --   HCT 37.3  --   --   --   PLT 160  --   --   --   APTT 27  --   --   --   LABPROT 18.6*  --   --   --   INR 1.57  --   --   --   HEPARINUNFRC  --   --   --  0.29*  CREATININE 0.97  --   --   --   TROPONINI 0.05* 0.04* 0.05* 0.05*    Estimated Creatinine Clearance: 52.4 mL/min (by C-G formula based on SCr of 0.97 mg/dL).  Medications:  No anticoagulation in PTA meds.  Assessment: 82 yo female started on heparin drip for NSTEM was stopped, now being restarted. Possible mesenteric artery thrombus/stenosis noted on CT scan as well.  Goal of Therapy:  Heparin level 0.3-0.7 units/ml Monitor platelets by anticoagulation protocol: Yes   Plan:  Restart heparin drip at 950 units/hr (=9.5 ml/hr). First heparin level 8 hours after restart of infusion. CBC in AM  10/7 1957 HL subtherapeutic x 1. 1200 units IV x 1 bolus and increase rate to 1100 units/hr. Recheck HL in 8 hours.   Laural Benes 01/24/2017,8:42 PM

## 2017-01-24 NOTE — Consult Note (Signed)
Reason for Consult:SMA Stenosis Referring Physician: Dr. Zachery Dauer Mia Padilla is an 82 y.o. female.  HPI: Pleasant 64 yof with history of reflux and bowel resection in the remote past presented with diffuse abdominal pain, nausea, emesis. She states several months ago she was told she had reflux and was given Pepcid. She denied food fear, weight loss, diarrhea or prior abdominal pain. 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 Upon discussion with the patient her abdominal symptoms are now minimal to absent. No further nausea or emesis.  Past Medical History:  Diagnosis Date  . Arthritis    hands  . Black head   . Bowel incontinence   . Breast cancer of upper-outer quadrant of left female breast ([place]) 2015   Left  . Carpal tunnel syndrome   . CHF (congestive heart failure) ([facility])    Pt. states dx about 5-10 years ago  . COPD (chronic obstructive pulmonary disease) ([place])   . Debilitated   . Diabetes mellitus without complication ([place])   . Glaucoma   . History of seizures   . Hyperlipidemia   . Hypertension   . Hypotension   . Obesity   . Urinary incontinence     Past Surgical History:  Procedure Laterality Date  . BOWEL RESECTION  2005  . BREAST SURGERY  08/03/13   left mastectomy  . CARPAL TUNNEL RELEASE Right 30 years   . MASTECTOMY     left   . PARTIAL HYSTERECTOMY    . TONSILLECTOMY    . UMBILICAL HERNIA REPAIR      Family History  Problem Relation Age of Onset  . Cervical cancer Mother   . Diabetes Mother   . Glaucoma Mother   . Cervical cancer Daughter   . Hypertension Daughter   . Hypertension Father   . Hypertension Sister   . Hypertension Brother   . Hypertension Son   . Diabetes Brother   . HIV Brother   . Breast cancer Neg Hx     Social History:  reports that she quit smoking about 42 years ago. She has never used smokeless tobacco. She reports that she does not drink alcohol  or use drugs.  Allergies:  Allergies  Allergen Reactions  . Propoxyphene Other (See Comments)    Other Reaction: Severe Headache  . Advil [Ibuprofen] Other (See Comments)    Makes her heart race  . Darvocet [Propoxyphene N-Acetaminophen]   . Percocet [Oxycodone-Acetaminophen]     hallucination    Medications: I have reviewed the patient's current medications.  Results for orders placed or performed during the hospital encounter of 01/24/17 (from the past 48 hour(s))  Lipase, blood     Status: None   Collection Time: 01/24/17  1:56 AM  Result Value Ref Range   Lipase 20 11 - 51 U/L  Comprehensive metabolic panel     Status: Abnormal   Collection Time: 01/24/17  1:56 AM  Result Value Ref Range   Sodium 138 135 - 145 mmol/L   Potassium 3.4 (L) 3.5 - 5.1 mmol/L   Chloride 99 (L) 101 - 111 mmol/L   CO2 29 22 - 32 mmol/L   Glucose, Bld 155 (H) 65 - 99 mg/dL   BUN 13 6 - 20 mg/dL   Creatinine, Ser 0.97 0.44 - 1.00 mg/dL   Calcium 8.8 (L) 8.9 - 10.3 mg/dL   Total Protein 9.4 (H) 6.5 - 8.1  g/dL   Albumin 3.6 3.5 - 5.0 g/dL   AST 26 15 - 41 U/L   ALT 13 (L) 14 - 54 U/L   Alkaline Phosphatase 51 38 - 126 U/L   Total Bilirubin 1.1 0.3 - 1.2 mg/dL   GFR calc non Af Amer 54 (L) >60 mL/min   GFR calc Af Amer >60 >60 mL/min    Comment: (NOTE) The eGFR has been calculated using the CKD EPI equation. This calculation has not been validated in all clinical situations. eGFR's persistently <60 mL/min signify possible Chronic Kidney Disease.    Anion gap 10 5 - 15  CBC     Status: Abnormal   Collection Time: 01/24/17  1:56 AM  Result Value Ref Range   WBC 6.1 3.6 - 11.0 K/uL   RBC 3.89 3.80 - 5.20 MIL/uL   Hemoglobin 12.4 12.0 - 16.0 g/dL   HCT 37.3 35.0 - 47.0 %   MCV 95.7 80.0 - 100.0 fL   MCH 31.8 26.0 - 34.0 pg   MCHC 33.2 32.0 - 36.0 g/dL   RDW 16.2 (H) 11.5 - 14.5 %   Platelets 160 150 - 440 K/uL  Protime-INR     Status: Abnormal   Collection Time: 01/24/17  1:56 AM   Result Value Ref Range   Prothrombin Time 18.6 (H) 11.4 - 15.2 seconds   INR 1.57   Magnesium     Status: Abnormal   Collection Time: 01/24/17  1:56 AM  Result Value Ref Range   Magnesium 1.2 (L) 1.7 - 2.4 mg/dL  Troponin I     Status: Abnormal   Collection Time: 01/24/17  1:56 AM  Result Value Ref Range   Troponin I 0.05 (HH) <0.03 ng/mL    Comment: CRITICAL RESULT CALLED TO, READ BACK BY AND VERIFIED WITH MATTHEW MARTIN AT 0238 01/24/17.PMH  APTT     Status: None   Collection Time: 01/24/17  1:56 AM  Result Value Ref Range   aPTT 27 24 - 36 seconds  TSH     Status: None   Collection Time: 01/24/17  7:39 AM  Result Value Ref Range   TSH 3.197 0.350 - 4.500 uIU/mL    Comment: Performed by a 3rd Generation assay with a functional sensitivity of <=0.01 uIU/mL.  Troponin I     Status: Abnormal   Collection Time: 01/24/17  7:39 AM  Result Value Ref Range   Troponin I 0.04 (HH) <0.03 ng/mL    Comment: CRITICAL VALUE NOTED. VALUE IS CONSISTENT WITH PREVIOUSLY REPORTED/CALLED VALUE  SDR   Urinalysis, Complete w Microscopic     Status: Abnormal   Collection Time: 01/24/17  8:20 AM  Result Value Ref Range   Color, Urine YELLOW (A) YELLOW   APPearance HAZY (A) CLEAR   Specific Gravity, Urine 1.021 1.005 - 1.030   pH 6.0 5.0 - 8.0   Glucose, UA NEGATIVE NEGATIVE mg/dL   Hgb urine dipstick LARGE (A) NEGATIVE   Bilirubin Urine NEGATIVE NEGATIVE   Ketones, ur NEGATIVE NEGATIVE mg/dL   Protein, ur 30 (A) NEGATIVE mg/dL   Nitrite NEGATIVE NEGATIVE   Leukocytes, UA LARGE (A) NEGATIVE   RBC / HPF NONE SEEN 0 - 5 RBC/hpf   WBC, UA 0-5 0 - 5 WBC/hpf   Bacteria, UA NONE SEEN NONE SEEN   Squamous Epithelial / LPF NONE SEEN NONE SEEN  Glucose, capillary     Status: Abnormal   Collection Time: 01/24/17  9:15 AM  Result Value Ref Range  Glucose-Capillary 122 (H) 65 - 99 mg/dL    Ct Angio Abd/pel W And/or Wo Contrast  Result Date: 01/24/2017 CLINICAL DATA:  Generalized abdominal pain  starting today. Vomiting. Loose bowel movements. History of ventricular a rib anemia. EXAM: CTA ABDOMEN AND PELVIS wITHOUT AND WITH CONTRAST TECHNIQUE: Multidetector CT imaging of the abdomen and pelvis was performed using the standard protocol during bolus administration of intravenous contrast. Multiplanar reconstructed images and MIPs were obtained and reviewed to evaluate the vascular anatomy. CONTRAST:  100 mL Isovue 370 COMPARISON:  11/22/2016 FINDINGS: VASCULAR Aorta: Normal caliber aorta without aneurysm, dissection, vasculitis or significant stenosis. Diffuse aortic calcification. Celiac: Calcification at the origin of the celiac axis but the celiac axis and branch vessels are patent without evidence of significant stenosis. SMA: Calcification at the origin of the superior mesenteric artery with scattered calcification throughout. There is focal narrowing at the origin of the superior mesenteric artery. This represents 50% or greater diameter reduction. The vessel remains patent through this area of stenosis. There is a focal filling defect in 1 of the second or third order right mesenteric branch vessels resulting and 80-90% diameter reduction focally. This could represent thrombus or atherosclerotic plaque. The vessel remains patent distal to this area. Renals: Single bilateral renal arteries are patent with calcification at the origins. IMA: Inferior mesenteric artery is patent. Inflow: Patent without evidence of aneurysm, dissection, vasculitis or significant stenosis. Proximal Outflow: Bilateral common femoral and visualized portions of the superficial and profunda femoral arteries are patent without evidence of aneurysm, dissection, vasculitis or significant stenosis. Veins: No obvious venous abnormality within the limitations of this arterial phase study. Review of the MIP images confirms the above findings. NON-VASCULAR Lower chest: Small bilateral pleural effusions. Mosaic attenuation pattern in  the lung bases suggesting edema or diffuse pneumonia. Cardiac enlargement. Pectus excavatum. Hepatobiliary: The gallbladder is distended. There is suggestion of small stones in the dependent gallbladder. No bile duct dilatation. No focal liver lesions. Pancreas: Unremarkable. No pancreatic ductal dilatation or surrounding inflammatory changes. Spleen: Calcified granulomas in the spleen.  No splenomegaly. Adrenals/Urinary Tract: No adrenal gland nodules. Multiple intrarenal stones bilaterally. Largest is in the right upper pole and measures about 5 mm in diameter. Bilateral renal cysts are incompletely characterized. No change since prior study. Low-attenuation zone in the upper pole right kidney is again demonstrated and incompletely characterized. This could be a focal defect due to infarct, infection, or mass lesion. As previously recommended on ultrasound 12/01/2016, MRI would be suggested in the elective setting for further evaluation. No hydronephrosis or hydroureter. The bladder wall is thickened and there is gas in the bladder. This suggests cystitis. There is a stone in the base of the bladder. Stomach/Bowel: Diffuse free intraperitoneal and a small amount of retroperitoneal air. Stomach is incompletely distended but there is evidence of gastric wall thickening. There is gas adjacent to the duodenum. This suggests the probability of a perforated duodenal ulcer. Small bowel and colon are not abnormally distended and no discrete areas of wall thickening or pneumatosis are demonstrated. Appendix is normal. Diverticulosis of the sigmoid colon without evidence of diverticulitis. Lymphatic: Aortic atherosclerosis. No enlarged abdominal or pelvic lymph nodes. Reproductive: Status post hysterectomy. No adnexal masses. Other: Surgical sutures in the anterior abdominal wall. Musculoskeletal: Degenerative changes diffusely throughout the lumbar spine. Severe degenerative changes in the left hip. No destructive bone  lesions. IMPRESSION: VASCULAR 1. Diffuse aortic and vascular calcifications. 2. Stenosis at the origin of the celiac axis and superior mesenteric artery  due to calcific stenosis. 3. Focal filling defect in a mid mesenteric SMA branch may represent thrombus or atherosclerotic plaque formation. NON-VASCULAR 1. Free intraperitoneal air and a small amount of retroperitoneal air. Suggestion of gastric wall thickening with tear adjacent to the duodenum. High suspicion for perforated duodenal ulcer. No definite findings to suggest bowel ischemia. 2. Small bilateral pleural effusions. Airspace disease in the lungs probably represents edema. 3. Distended gallbladder with small stones. 4. Multiple bilateral nonobstructing intrarenal stones. 5. Hypoenhancing zone in the upper pole right kidney is unchanged since previous study. Differential diagnosis would include infarct, infection, or mass. As recommended on prior ultrasound, elective MRI is recommended for further evaluation. 6. Bladder wall thickening with gas in the bladder suggesting cystitis unless there has been instrumentation. Small bladder stone. 7. Calcified splenic granulomas. These results were called by telephone at the time of interpretation on 01/24/2017 at 6:24 am to Dr. Hinda Kehr , who verbally acknowledged these results. Electronically Signed   By: Lucienne Capers M.D.   On: 01/24/2017 06:28    Review of Systems  Constitutional: Negative for chills, fever and weight loss.  Respiratory: Negative.   Cardiovascular: Positive for chest pain and leg swelling.  Gastrointestinal: Positive for abdominal pain, heartburn and vomiting. Negative for diarrhea and melena.  Genitourinary: Negative.   Skin: Negative.    Blood pressure (!) 153/70, pulse (!) 103, temperature 97.8 F (36.6 C), temperature source Oral, resp. rate 18, height '5\' 7"'$  (1.702 m), weight 87.1 kg (192 lb), SpO2 98 %. Physical Exam  Nursing note and vitals reviewed. Constitutional:  She is oriented to person, place, and time. She appears well-developed and well-nourished.  Cardiovascular: Normal rate and regular rhythm.   Respiratory: Effort normal.  GI: Soft. She exhibits no distension. There is no tenderness. There is no rebound and no guarding.  Musculoskeletal: She exhibits no edema or tenderness.  Neurological: She is alert and oriented to person, place, and time.  Skin: Skin is warm and dry.    Assessment/Plan:  SMA stenosis.  I reviewed the CTA. The SMA has mild/moderate stenosis and a small filling defect in the distal braches but widely patent beyond this. The Celiac and IMA are widely patent. There is no evidence of mesenteric ischemia.  The etiology of her abdominal pain is not the SMA finding. But likely the perforated duodenal ulcer. Nonetheless, Continue Heparin gtt per Cardiac protocol for NSTEMI. Continue Plavix. No Vascular Surgery Intervention recommended.  General Surgery following.  Please call/reconsult for further questions/concerns.   Maple Odaniel A 01/24/2017, 10:26 AM

## 2017-01-24 NOTE — Progress Notes (Signed)
Pharmacy Antibiotic Note  Mia Padilla is a 82 y.o. female admitted on 01/24/2017 with intra-abdominal infection.  Pharmacy has been consulted for Zosyn dosing.  Plan: Zosyn 3.375g IV q8h (4 hour infusion).  Height: 5\' 7"  (170.2 cm) Weight: 192 lb (87.1 kg) IBW/kg (Calculated) : 61.6  Temp (24hrs), Avg:97.9 F (36.6 C), Min:97.8 F (36.6 C), Max:98 F (36.7 C)   Recent Labs Lab 01/24/17 0156  WBC 6.1  CREATININE 0.97    Estimated Creatinine Clearance: 52.4 mL/min (by C-G formula based on SCr of 0.97 mg/dL).    Allergies  Allergen Reactions  . Propoxyphene Other (See Comments)    Other Reaction: Severe Headache  . Advil [Ibuprofen] Other (See Comments)    Makes her heart race  . Darvocet [Propoxyphene N-Acetaminophen]   . Percocet [Oxycodone-Acetaminophen]     hallucination    Antimicrobials this admission: Zosyn 10/7  >>    >>   Dose adjustments this admission:   Microbiology results: No micro    10/7 UA: pending Thank you for allowing pharmacy to be a part of this patient's care.  Alexandros Ewan S 01/24/2017 6:41 AM

## 2017-01-24 NOTE — ED Notes (Signed)
Patient transported to CT 

## 2017-01-24 NOTE — Plan of Care (Signed)
Problem: Fluid Volume: Goal: Ability to maintain a balanced intake and output will improve Outcome: Not Progressing Patient NPO for remainder of day, diet confirmed. Possibly will be able to eat in the AM. Informed patient. Wenda Low [facility]

## 2017-01-24 NOTE — ED Provider Notes (Signed)
[facility] Emergency Department Provider Note  ____________________________________________   First MD Initiated Contact with Patient 01/24/17 [id number]     (approximate)  I have reviewed the triage vital signs and the nursing notes.   HISTORY  Chief Complaint Abdominal Pain and Emesis    HPI Mia Padilla is a 82 y.o. female who presents for evaluation of generalized abdominal pain for one to two days. she said that it began gradually and became a little bit worse, but it comes and goes.  It feels worse when she is trying to have a bowel movement, but then feels better afterwards.  She has had multiple episodes of vomiting, but she is not currently nauseated.  She denies fever/chills, chest pain, SOB, and dysuria.  Except as described previously, nothing in particular makes her symptoms better nor worse.  No blood seen in stool or emesis.  She reports taking Plavix, but does not take any other blood thinners.  Dr. Rockey Situ is her cardiologist.    Past Medical History:  Diagnosis Date  . Arthritis    hands  . Black head   . Bowel incontinence   . Breast cancer of upper-outer quadrant of left female breast ([place]) 2015   Left  . Carpal tunnel syndrome   . CHF (congestive heart failure) (HCC)    Pt. states dx about 5-10 years ago  . COPD (chronic obstructive pulmonary disease) ([place])   . Debilitated   . Diabetes mellitus without complication ([place])   . Glaucoma   . History of seizures   . Hyperlipidemia   . Hypertension   . Hypotension   . Obesity   . Urinary incontinence     Patient Active Problem List   Diagnosis Date Noted  . NSTEMI (non-ST elevated myocardial infarction) ([place]) 01/24/2017  . Advanced care planning/counseling discussion 11/19/2016  . Carotid stenosis 11/17/2016  . Lymphedema 02/14/2016  . Chronic venous insufficiency 02/14/2016  . Vitamin D deficiency 11/12/2015  . B12 deficiency 11/11/2015  . Peripheral vascular disease ([place])  04/23/2015  . Osteoarthritis 04/23/2015  . Hypothyroidism 02/07/2015  . Urinary incontinence 02/07/2015  . Breast cancer ([place]) 11/15/2013  . Malignant neoplasm of upper-outer quadrant of female breast ([place]) 06/28/2013  . Hypertension 10/15/2011  . A-fib ([place]) 06/04/2011  . COPD (chronic obstructive pulmonary disease) ([place]) 06/04/2011  . Obese 06/04/2011  . Seizures ([place]) 06/04/2011  . History of CVA (cerebrovascular accident) 06/04/2011  . Diabetes mellitus ([place]) 06/04/2011    Past Surgical History:  Procedure Laterality Date  . BOWEL RESECTION  2005  . BREAST SURGERY  08/03/13   left mastectomy  . CARPAL TUNNEL RELEASE Right 30 years   . MASTECTOMY     left   . PARTIAL HYSTERECTOMY    . TONSILLECTOMY    . UMBILICAL HERNIA REPAIR      Prior to Admission medications   Medication Sig Start Date End Date Taking? Authorizing Provider  acetaminophen (TYLENOL) 500 MG tablet Take 500 mg by mouth every 6 (six) hours as needed.   Yes [provider]  albuterol (PROVENTIL HFA;VENTOLIN HFA) 108 (90 Base) MCG/ACT inhaler Inhale 2 puffs into the lungs every 6 (six) hours as needed for wheezing or shortness of breath. 05/08/16  Yes Lisa Roca, MD  amoxicillin-clavulanate (AUGMENTIN) 500-125 MG tablet Take 1 tablet by mouth 2 (two) times daily.   Yes [provider]  anastrozole (ARIMIDEX) 1 MG tablet Take 1 tablet (1 mg total) by mouth daily. 06/08/16  Yes Sankar,  Seeplaputhur G, MD  brimonidine (ALPHAGAN P) 0.1 % SOLN 2 (two) times daily.    Yes [provider]  clopidogrel (PLAVIX) 75 MG tablet Take 1 tablet (75 mg total) by mouth daily. 11/19/16  Yes Crissman, Redge Gainer, MD  cyanocobalamin (,VITAMIN B-12,) 1000 MCG/ML injection Inject 1 mL (1,000 mcg total) into the muscle every 30 (thirty) days. 07/16/16  Yes Particia Nearing, PA-C  digoxin (LANOXIN) 0.125 MG tablet Take 1 tablet (125 mcg total) by mouth daily. 11/19/16  Yes Crissman, Redge Gainer, MD  dorzolamide  (TRUSOPT) 2 % ophthalmic solution 1 drop 2 (two) times daily.    Yes [provider]  Fluticasone-Salmeterol (ADVAIR) 250-50 MCG/DOSE AEPB Inhale 1 puff into the lungs every 12 (twelve) hours. 08/18/16  Yes Crissman, Redge Gainer, MD  furosemide (LASIX) 20 MG tablet Take 1 tablet (20 mg total) by mouth daily as needed. 05/12/16 05/12/17 Yes Gollan, Tollie Pizza, MD  latanoprost (XALATAN) 0.005 % ophthalmic solution Place 1 drop into both eyes at bedtime.   Yes [provider]  LEVEMIR FLEXTOUCH 100 UNIT/ML Pen INJECT 23 TO 30 UNITS PER DAY Patient taking differently: Inject 26 Units into the skin daily at 10 pm.  07/16/16  Yes Particia Nearing, PA-C  levothyroxine (SYNTHROID, LEVOTHROID) 100 MCG tablet Take 1 tablet (100 mcg total) by mouth daily. 11/19/16  Yes Crissman, Redge Gainer, MD  lisinopril (PRINIVIL,ZESTRIL) 10 MG tablet Take 1 tablet (10 mg total) by mouth daily. 08/18/16  Yes Crissman, Redge Gainer, MD  meclizine (ANTIVERT) 25 MG tablet Take 1 tablet (25 mg total) by mouth 3 (three) times daily as needed. 05/18/16  Yes Crissman, Redge Gainer, MD  metFORMIN (GLUCOPHAGE) 500 MG tablet Take 1 tablet (500 mg total) by mouth 2 (two) times daily with a meal. Patient taking differently: Take 500 mg by mouth daily with breakfast.  08/18/16  Yes Crissman, Redge Gainer, MD  metoprolol succinate (TOPROL-XL) 100 MG 24 hr tablet Take 1 tablet (100 mg total) by mouth daily. 08/18/16  Yes Crissman, Redge Gainer, MD  mometasone (ELOCON) 0.1 % cream Apply 1 application topically daily. Apply 1-2 times daily for no longer than 2 weeks at a time 11/24/16  Yes Maurice March, Salley Hews, PA-C  mupirocin ointment (BACTROBAN) 2 % Apply 1 application topically 2 (two) times daily. Do not use day in and day out 11/19/16  Yes Crissman, Redge Gainer, MD  nystatin (MYCOSTATIN/NYSTOP) powder APPY TO THE AFFECTED AREA 4 TIMES DAILY 02/17/16  Yes Johnson, Megan P, DO  Olopatadine HCl (PATADAY) 0.2 % SOLN Apply 1 drop to eye daily.    Yes [provider]  solifenacin (VESICARE) 10 MG tablet Take 1 tablet (10 mg total) by mouth daily. 08/18/16  Yes Crissman, Redge Gainer, MD  Vitamin D, Ergocalciferol, (DRISDOL) 50000 units CAPS capsule TAKE 1 CAPSULE BY MOUTH EVERY 7 DAYS. 02/17/16  Yes Johnson, Megan P, DO  benzonatate (TESSALON PERLES) 100 MG capsule Take 1 capsule (100 mg total) by mouth 3 (three) times daily as needed for cough. Patient not taking: Reported on 01/24/2017 05/18/16   Steele Sizer, MD  blood glucose meter kit and supplies Diagnosis:E11.9 Test glucose twice daily 11/21/14   Olevia Perches P, DO  famotidine (PEPCID) 20 MG tablet Take 1 tablet (20 mg total) by mouth daily. Patient not taking: Reported on 11/24/2016 11/22/16 11/22/17  Phineas Semen, MD  fluticasone [facility]) 50 MCG/ACT nasal spray Place 2 sprays into both nostrils daily. Patient not taking: Reported on 01/24/2017  10/14/15   Volney American, PA-C  glucose blood test strip Use as instructed 11/05/14   [name], MD    Allergies Propoxyphene; Advil [ibuprofen]; Darvocet [propoxyphene n-acetaminophen]; and Percocet [oxycodone-acetaminophen]  Family History  Problem Relation Age of Onset  . Cervical cancer Mother   . Diabetes Mother   . Glaucoma Mother   . Cervical cancer Daughter   . Hypertension Daughter   . Hypertension Father   . Hypertension Sister   . Hypertension Brother   . Hypertension Son   . Diabetes Brother   . HIV Brother   . Breast cancer Neg Hx     Social History Social History  Substance Use Topics  . Smoking status: Former Smoker    Quit date: 04/20/1974  . Smokeless tobacco: Never Used     Comment: Quit 1976  . Alcohol use No    Review of Systems Constitutional: No fever/chills Eyes: No visual changes. ENT: No sore throat. Cardiovascular: Denies chest pain. Respiratory: Denies shortness of breath. Gastrointestinal: Several days of abd pain with a few episodes of emesis.  Pain worsens then improves with bowel  movements. Genitourinary: Negative for dysuria. Musculoskeletal: Negative for neck pain.  Negative for back pain. Integumentary: Negative for rash. Neurological: Negative for headaches, focal weakness or numbness.   ____________________________________________   PHYSICAL EXAM:  VITAL SIGNS: ED Triage Vitals  Enc Vitals Group     BP 01/24/17 0129 (!) 111/45     Pulse Rate 01/24/17 0129 (!) 113     Resp 01/24/17 0129 14     Temp 01/24/17 0129 98 F (36.7 C)     Temp Source 01/24/17 0129 Oral     SpO2 01/24/17 0129 96 %     Weight 01/24/17 0130 87.1 kg (192 lb)     Height 01/24/17 0130 1.702 m ('5\' 7"'$ )     Head Circumference --      Peak Flow --      Pain Score 01/24/17 0136 6     Pain Loc --      Pain Edu? --      Excl. in [place]? --     Constitutional: Alert and oriented. Elderly, but generally well appearing and in no acute distress.  Joking and happily conversing with her family and me. Eyes: Conjunctivae are normal.  Head: Atraumatic. Nose: No congestion/rhinnorhea. Mouth/Throat: Mucous membranes are moist. Neck: No stridor.  No meningeal signs.   Cardiovascular: Tachycardia in the 110s, irregular rhythm due to frequent PVCs, often in couplets or triplets. Good peripheral circulation. Grossly normal heart sounds. Respiratory: Normal respiratory effort.  No retractions. Lungs CTAB. Gastrointestinal: Soft and non-distended.  Some tenderness to palpation primarily in the LLQ, but generally benign abd exam.  No rebound/guarding. Musculoskeletal: No lower extremity tenderness nor edema. No gross deformities of extremities. Neurologic:  Normal speech and language. No gross focal neurologic deficits are appreciated.  Skin:  Skin is warm, dry and intact. No rash noted. Psychiatric: Mood and affect are normal. Speech and behavior are normal.  ____________________________________________   LABS (all labs ordered are listed, but only abnormal results are displayed)  Labs Reviewed   COMPREHENSIVE METABOLIC PANEL - Abnormal; Notable for the following:       Result Value   Potassium 3.4 (*)    Chloride 99 (*)    Glucose, Bld 155 (*)    Calcium 8.8 (*)    Total Protein 9.4 (*)    ALT 13 (*)    GFR calc non  Af Amer 54 (*)    All other components within normal limits  CBC - Abnormal; Notable for the following:    RDW 16.2 (*)    All other components within normal limits  PROTIME-INR - Abnormal; Notable for the following:    Prothrombin Time 18.6 (*)    All other components within normal limits  MAGNESIUM - Abnormal; Notable for the following:    Magnesium 1.2 (*)    All other components within normal limits  TROPONIN I - Abnormal; Notable for the following:    Troponin I 0.05 (*)    All other components within normal limits  LIPASE, BLOOD  APTT  URINALYSIS, COMPLETE (UACMP) WITH MICROSCOPIC  HEPARIN LEVEL (UNFRACTIONATED)  TSH  TROPONIN I  TROPONIN I  TROPONIN I  HEMOGLOBIN A1C   ____________________________________________  EKG  ED ECG REPORT I, Deveon Kisiel, the attending physician, personally viewed and interpreted this ECG.  Date: 01/24/2017 EKG Time: 1:40 AM Rate: 113 Rhythm: Sinus tachycardia with frequent PVCs QRS Axis: normal Intervals: normal ST/T Wave abnormalities: ST depression in lateral leads as well as in 3 and aVF with 1 mm of ST elevation in lead aVL Narrative Interpretation: In [place] for acute ischemia and change from prior EKGs  ____________________________________________  RADIOLOGY I, Hinda Kehr, personally discussed these images and results by phone with the on-call radiologist and used this discussion as part of my medical decision making.    Ct Angio Abd/pel W And/or Wo Contrast  Result Date: 01/24/2017 CLINICAL DATA:  Generalized abdominal pain starting today. Vomiting. Loose bowel movements. History of ventricular a rib anemia. EXAM: CTA ABDOMEN AND PELVIS wITHOUT AND WITH CONTRAST TECHNIQUE: Multidetector CT  imaging of the abdomen and pelvis was performed using the standard protocol during bolus administration of intravenous contrast. Multiplanar reconstructed images and MIPs were obtained and reviewed to evaluate the vascular anatomy. CONTRAST:  100 mL Isovue 370 COMPARISON:  11/22/2016 FINDINGS: VASCULAR Aorta: Normal caliber aorta without aneurysm, dissection, vasculitis or significant stenosis. Diffuse aortic calcification. Celiac: Calcification at the origin of the celiac axis but the celiac axis and branch vessels are patent without evidence of significant stenosis. SMA: Calcification at the origin of the superior mesenteric artery with scattered calcification throughout. There is focal narrowing at the origin of the superior mesenteric artery. This represents 50% or greater diameter reduction. The vessel remains patent through this area of stenosis. There is a focal filling defect in 1 of the second or third order right mesenteric branch vessels resulting and 80-90% diameter reduction focally. This could represent thrombus or atherosclerotic plaque. The vessel remains patent distal to this area. Renals: Single bilateral renal arteries are patent with calcification at the origins. IMA: Inferior mesenteric artery is patent. Inflow: Patent without evidence of aneurysm, dissection, vasculitis or significant stenosis. Proximal Outflow: Bilateral common femoral and visualized portions of the superficial and profunda femoral arteries are patent without evidence of aneurysm, dissection, vasculitis or significant stenosis. Veins: No obvious venous abnormality within the limitations of this arterial phase study. Review of the MIP images confirms the above findings. NON-VASCULAR Lower chest: Small bilateral pleural effusions. Mosaic attenuation pattern in the lung bases suggesting edema or diffuse pneumonia. Cardiac enlargement. Pectus excavatum. Hepatobiliary: The gallbladder is distended. There is suggestion of small  stones in the dependent gallbladder. No bile duct dilatation. No focal liver lesions. Pancreas: Unremarkable. No pancreatic ductal dilatation or surrounding inflammatory changes. Spleen: Calcified granulomas in the spleen.  No splenomegaly. Adrenals/Urinary Tract: No adrenal gland nodules. Multiple  intrarenal stones bilaterally. Largest is in the right upper pole and measures about 5 mm in diameter. Bilateral renal cysts are incompletely characterized. No change since prior study. Low-attenuation zone in the upper pole right kidney is again demonstrated and incompletely characterized. This could be a focal defect due to infarct, infection, or mass lesion. As previously recommended on ultrasound 12/01/2016, MRI would be suggested in the elective setting for further evaluation. No hydronephrosis or hydroureter. The bladder wall is thickened and there is gas in the bladder. This suggests cystitis. There is a stone in the base of the bladder. Stomach/Bowel: Diffuse free intraperitoneal and a small amount of retroperitoneal air. Stomach is incompletely distended but there is evidence of gastric wall thickening. There is gas adjacent to the duodenum. This suggests the probability of a perforated duodenal ulcer. Small bowel and colon are not abnormally distended and no discrete areas of wall thickening or pneumatosis are demonstrated. Appendix is normal. Diverticulosis of the sigmoid colon without evidence of diverticulitis. Lymphatic: Aortic atherosclerosis. No enlarged abdominal or pelvic lymph nodes. Reproductive: Status post hysterectomy. No adnexal masses. Other: Surgical sutures in the anterior abdominal wall. Musculoskeletal: Degenerative changes diffusely throughout the lumbar spine. Severe degenerative changes in the left hip. No destructive bone lesions. IMPRESSION: VASCULAR 1. Diffuse aortic and vascular calcifications. 2. Stenosis at the origin of the celiac axis and superior mesenteric artery due to calcific  stenosis. 3. Focal filling defect in a mid mesenteric SMA branch may represent thrombus or atherosclerotic plaque formation. NON-VASCULAR 1. Free intraperitoneal air and a small amount of retroperitoneal air. Suggestion of gastric wall thickening with tear adjacent to the duodenum. High suspicion for perforated duodenal ulcer. No definite findings to suggest bowel ischemia. 2. Small bilateral pleural effusions. Airspace disease in the lungs probably represents edema. 3. Distended gallbladder with small stones. 4. Multiple bilateral nonobstructing intrarenal stones. 5. Hypoenhancing zone in the upper pole right kidney is unchanged since previous study. Differential diagnosis would include infarct, infection, or mass. As recommended on prior ultrasound, elective MRI is recommended for further evaluation. 6. Bladder wall thickening with gas in the bladder suggesting cystitis unless there has been instrumentation. Small bladder stone. 7. Calcified splenic granulomas. These results were called by telephone at the time of interpretation on 01/24/2017 at 6:24 am to Dr. Hinda Kehr , who verbally acknowledged these results. Electronically Signed   By: Lucienne Capers M.D.   On: 01/24/2017 06:28    ____________________________________________   PROCEDURES  Critical Care performed: Yes, see critical care procedure note(s)   Procedure(s) performed:   .Critical Care Performed by: Hinda Kehr Authorized by: Hinda Kehr   Critical care provider statement:    Critical care time (minutes):  45   Critical care time was exclusive of:  Separately billable procedures and treating other patients   Critical care was necessary to treat or prevent imminent or life-threatening deterioration of the following conditions:  Cardiac failure and circulatory failure   Critical care was time spent personally by me on the following activities:  Development of treatment plan with patient or surrogate, discussions with  consultants, evaluation of patient's response to treatment, examination of patient, obtaining history from patient or surrogate, ordering and performing treatments and interventions, ordering and review of laboratory studies, ordering and review of radiographic studies, pulse oximetry, re-evaluation of patient's condition and review of old charts      ____________________________________________   INITIAL IMPRESSION / [place] / ED COURSE  As part of my medical decision making,  I reviewed the following data within the electronic MEDICAL RECORD NUMBER History obtained from family, Nursing notes reviewed and incorporated, Labs reviewed , EKG interpreted , Old EKG reviewed, Old chart reviewed and Discussed with radiologist    Numerous ill patients in the ED made it impossible for me to document in real time.  In summary, Differential diagnosis includes, but is not limited to, ovarian cyst, ovarian torsion, acute appendicitis, diverticulitis, urinary tract infection/pyelonephritis, endometriosis, bowel obstruction, colitis, renal colic, gastroenteritis, hernia, neoplasm, etc.  However, my main concern was that this might actually be cardiac.  I was brought her ECG from triage, and it was changed for prior and suggested acute ischemia.  In addition to "GI" labs, I obtained a troponin which was slightly elevated at 0.05.  I reviewed old medical records, and Dr. Mariah Milling has diagnosed her with paroxysmal a-fib for which she is not anticoagulated given that she is a falls risk.  However, given her constellation of symptoms, I am concerned about the possibility of mild or intermittent bowel ischemia due to cardiac arrhythmia and/or thromboembolism.  I discussed the case by phone with Dr. Zonia Kief with radiology to discuss imaging modalities, and we decided upon CTA abd/pelvis to evaluate her arterial supply as well as getting a good look at her abdomen in general.  Given the ECG changes and elevated  troponin, I gave her a full dose aspirin and started her on heparin for NSTEMI.  Discussed with patient and family, and they understand and agree.  I discussed the case in person with Dr. Sheryle Hail (hospitalist) who will admit; although the CTA abd/pelvis is still pending, the patient is still medically complex, and any surgical or vascular intervention should be as consults while the medicine team manages her primarily.  Although infectious processes are still possible, I feel they are less likely given the minimal tenderness to palpation, no fever, and no leukocytosis.  I will not give antibiotics unless a source can be identified.  I gave a small fluid bolus and ordered a drip after the bolus.  Patient has not yet been able to provide a urine sample, but UA will be sent when she does so.   ----------------------------------------- 6:30 AM on 01/24/2017 -----------------------------------------  I received a call from Dr. Zonia Kief with radiology to let me know about multiple abnormalities on the patient's CTA abdomen and pelvis.  Most concerning was the presence of free air which the radiologist feels may be coming from a duodenal ulcer.  She also has a small urticarial occlusion, free air in the bladder wall most likely consistent with cystitis, and several other abnormalities as documented in his written report.  I immediately discussed these results with Dr. Sheryle Hail the admitting hospitalist in person.  In the presence of Dr. Sheryle Hail I then called the surgeon, Dr. Everlene Farrier, and informed him of the need of an urgent surgical consult and explained the case over the phone.  As I was doing that Dr. Sheryle Hail was putting in the order for Zosyn 3.375 g IV and the surgical consult.  Dr. Everlene Farrier acknowledged the situation and either he or his morning surgical colleague will see the patient as soon as possible on the floor.    ____________________________________________  FINAL CLINICAL IMPRESSION(S) / ED  DIAGNOSES  Final diagnoses:  NSTEMI (non-ST elevated myocardial infarction) (HCC)  Lower abdominal pain  Cardiac arrhythmia, unspecified cardiac arrhythmia type  Hypomagnesemia     MEDICATIONS GIVEN DURING THIS VISIT:  Medications  heparin ADULT infusion 100 units/mL (25000  units/22m sodium chloride 0.45%) (950 Units/hr Intravenous New Bag/Given 01/24/17 0441)  dorzolamide (TRUSOPT) 2 % ophthalmic solution 1 drop (not administered)  brimonidine (ALPHAGAN) 0.15 % ophthalmic solution 1 drop (not administered)  olopatadine (PATANOL) 0.1 % ophthalmic solution 1 drop (not administered)  nystatin (MYCOSTATIN/NYSTOP) topical powder (not administered)  albuterol (PROVENTIL HFA;VENTOLIN HFA) 108 (90 Base) MCG/ACT inhaler 2 puff (not administered)  furosemide (LASIX) tablet 20 mg (not administered)  meclizine (ANTIVERT) tablet 25 mg (not administered)  anastrozole (ARIMIDEX) tablet 1 mg (not administered)  cyanocobalamin ((VITAMIN B-12)) injection 1,000 mcg (not administered)  mometasone-formoterol (DULERA) 200-5 MCG/ACT inhaler 2 puff (not administered)  lisinopril (PRINIVIL,ZESTRIL) tablet 10 mg (not administered)  metoprolol succinate (TOPROL-XL) 24 hr tablet 100 mg (not administered)  digoxin (LANOXIN) tablet 125 mcg (not administered)  clopidogrel (PLAVIX) tablet 75 mg (not administered)  levothyroxine (SYNTHROID, LEVOTHROID) tablet 100 mcg (not administered)  mupirocin ointment (BACTROBAN) 2 % 1 application (not administered)  latanoprost (XALATAN) 0.005 % ophthalmic solution 1 drop (not administered)  acetaminophen (TYLENOL) tablet 650 mg (not administered)    Or  acetaminophen (TYLENOL) suppository 650 mg (not administered)  ondansetron (ZOFRAN) tablet 4 mg (not administered)    Or  ondansetron (ZOFRAN) injection 4 mg (not administered)  docusate sodium (COLACE) capsule 100 mg (not administered)  0.9 % NaCl with KCl 40 mEq / L  infusion (not administered)  insulin detemir  (LEVEMIR) injection 15 Units (not administered)  insulin aspart (novoLOG) injection 0-9 Units (not administered)  piperacillin-tazobactam (ZOSYN) IVPB 3.375 g (not administered)  magnesium sulfate IVPB 2 g 50 mL (0 g Intravenous Stopped 01/24/17 0447)  sodium chloride 0.9 % bolus 500 mL (0 mLs Intravenous Stopped 01/24/17 0450)  heparin injection 4,000 Units (4,000 Units Intravenous Given 01/24/17 0440)  aspirin chewable tablet 324 mg (324 mg Oral Given 01/24/17 0440)  0.9 %  sodium chloride infusion ( Intravenous New Bag/Given 01/24/17 0352)  iopamidol (ISOVUE-370) 76 % injection 100 mL (100 mLs Intravenous Contrast Given 01/24/17 0531)     NEW OUTPATIENT MEDICATIONS STARTED DURING THIS VISIT:  Current Discharge Medication List      Current Discharge Medication List      Current Discharge Medication List       Note:  This document was prepared using Dragon voice recognition software and may include unintentional dictation errors.    FHinda Kehr MD 01/24/17 [phone]

## 2017-01-24 NOTE — ED Notes (Signed)
Admitting Provider at bedside. 

## 2017-01-24 NOTE — ED Notes (Signed)
Pt. States eating less than normal today, but feeling hungry now.

## 2017-01-24 NOTE — ED Notes (Signed)
Second call RN not available

## 2017-01-24 NOTE — Consult Note (Signed)
Surgical Consultation  01/24/2017  Mia Padilla is an 82 y.o. female.   Referring Physician: Rosilyn Mings  CC: Perforated ulcer  HPI: This patient admitted the hospital with a non-ST elevation MI with elevated troponins. On workup for abdominal pain and a CT scan was obtained showing free air and likely perforation of a duodenal ulcer. Currently the patient is comfortable and denies any abdominal pain. She has no nausea vomiting fevers or chills and has not noticed any melena.  She has a personal history of breast cancer and a family history of breast cancer.  Past Medical History:  Diagnosis Date  . Arthritis    hands  . Black head   . Bowel incontinence   . Breast cancer of upper-outer quadrant of left female breast ([facility]) 2015   Left  . Carpal tunnel syndrome   . CHF (congestive heart failure) ([facility])    Pt. states dx about 5-10 years ago  . COPD (chronic obstructive pulmonary disease) ([place])   . Debilitated   . Diabetes mellitus without complication ([place])   . Glaucoma   . History of seizures   . Hyperlipidemia   . Hypertension   . Hypotension   . Obesity   . Urinary incontinence     Past Surgical History:  Procedure Laterality Date  . BOWEL RESECTION  2005  . BREAST SURGERY  08/03/13   left mastectomy  . CARPAL TUNNEL RELEASE Right 30 years   . MASTECTOMY     left   . PARTIAL HYSTERECTOMY    . TONSILLECTOMY    . UMBILICAL HERNIA REPAIR      Family History  Problem Relation Age of Onset  . Cervical cancer Mother   . Diabetes Mother   . Glaucoma Mother   . Cervical cancer Daughter   . Hypertension Daughter   . Hypertension Father   . Hypertension Sister   . Hypertension Brother   . Hypertension Son   . Diabetes Brother   . HIV Brother   . Breast cancer Neg Hx     Social History:  reports that she quit smoking about 42 years ago. She has never used smokeless tobacco. She reports that she does not drink alcohol or use drugs.  Allergies:  Allergies   Allergen Reactions  . Propoxyphene Other (See Comments)    Other Reaction: Severe Headache  . Advil [Ibuprofen] Other (See Comments)    Makes her heart race  . Darvocet [Propoxyphene N-Acetaminophen]   . Percocet [Oxycodone-Acetaminophen]     hallucination    Medications reviewed.   Review of Systems:   Review of Systems  Constitutional: Negative for chills and fever.  HENT: Negative.   Eyes: Negative.   Respiratory: Negative.   Cardiovascular: Positive for chest pain. Negative for palpitations and orthopnea.  Gastrointestinal: Positive for abdominal pain and heartburn. Negative for constipation, diarrhea, nausea and vomiting.  Genitourinary: Negative.   Musculoskeletal: Negative.   Skin: Negative.   Neurological: Negative.   Endo/Heme/Allergies: Negative.   Psychiatric/Behavioral: Negative.      Physical Exam:  BP (!) 153/70 (BP Location: Right Arm)   Pulse (!) 103   Temp 97.8 F (36.6 C) (Oral)   Resp 18   Ht 5' 7" (1.702 m)   Wt 192 lb (87.1 kg)   SpO2 98%   BMI 30.07 kg/m   Physical Exam  Constitutional: She is oriented to person, place, and time and well-developed, well-nourished, and in no distress. No distress.  HENT:  Head: Normocephalic and  atraumatic.  Eyes: Right eye exhibits no discharge. Left eye exhibits no discharge. No scleral icterus.  Neck: Normal range of motion.  Cardiovascular: Normal rate.   Pulmonary/Chest: Effort normal. No respiratory distress.  Abdominal: Soft. She exhibits no distension. There is no tenderness. There is no rebound and no guarding.  Midline scar noted. Abdomen is completely soft and nontender  Musculoskeletal: Normal range of motion. She exhibits edema. She exhibits no tenderness.  Lymphadenopathy:    She has no cervical adenopathy.  Neurological: She is alert and oriented to person, place, and time.  Skin: Skin is warm and dry. No rash noted. She is not diaphoretic. No erythema.  Psychiatric: Mood and affect  normal.  Vitals reviewed.     Results for orders placed or performed during the hospital encounter of 01/24/17 (from the past 48 hour(s))  Lipase, blood     Status: None   Collection Time: 01/24/17  1:56 AM  Result Value Ref Range   Lipase 20 11 - 51 U/L  Comprehensive metabolic panel     Status: Abnormal   Collection Time: 01/24/17  1:56 AM  Result Value Ref Range   Sodium 138 135 - 145 mmol/L   Potassium 3.4 (L) 3.5 - 5.1 mmol/L   Chloride 99 (L) 101 - 111 mmol/L   CO2 29 22 - 32 mmol/L   Glucose, Bld 155 (H) 65 - 99 mg/dL   BUN 13 6 - 20 mg/dL   Creatinine, Ser 0.97 0.44 - 1.00 mg/dL   Calcium 8.8 (L) 8.9 - 10.3 mg/dL   Total Protein 9.4 (H) 6.5 - 8.1 g/dL   Albumin 3.6 3.5 - 5.0 g/dL   AST 26 15 - 41 U/L   ALT 13 (L) 14 - 54 U/L   Alkaline Phosphatase 51 38 - 126 U/L   Total Bilirubin 1.1 0.3 - 1.2 mg/dL   GFR calc non Af Amer 54 (L) >60 mL/min   GFR calc Af Amer >60 >60 mL/min    Comment: (NOTE) The eGFR has been calculated using the CKD EPI equation. This calculation has not been validated in all clinical situations. eGFR's persistently <60 mL/min signify possible Chronic Kidney Disease.    Anion gap 10 5 - 15  CBC     Status: Abnormal   Collection Time: 01/24/17  1:56 AM  Result Value Ref Range   WBC 6.1 3.6 - 11.0 K/uL   RBC 3.89 3.80 - 5.20 MIL/uL   Hemoglobin 12.4 12.0 - 16.0 g/dL   HCT 37.3 35.0 - 47.0 %   MCV 95.7 80.0 - 100.0 fL   MCH 31.8 26.0 - 34.0 pg   MCHC 33.2 32.0 - 36.0 g/dL   RDW 16.2 (H) 11.5 - 14.5 %   Platelets 160 150 - 440 K/uL  Protime-INR     Status: Abnormal   Collection Time: 01/24/17  1:56 AM  Result Value Ref Range   Prothrombin Time 18.6 (H) 11.4 - 15.2 seconds   INR 1.57   Magnesium     Status: Abnormal   Collection Time: 01/24/17  1:56 AM  Result Value Ref Range   Magnesium 1.2 (L) 1.7 - 2.4 mg/dL  Troponin I     Status: Abnormal   Collection Time: 01/24/17  1:56 AM  Result Value Ref Range   Troponin I 0.05 (HH) <0.03  ng/mL    Comment: CRITICAL RESULT CALLED TO, READ BACK BY AND VERIFIED WITH MATTHEW MARTIN AT 0238 01/24/17.PMH  APTT     Status:  None   Collection Time: 01/24/17  1:56 AM  Result Value Ref Range   aPTT 27 24 - 36 seconds  TSH     Status: None   Collection Time: 01/24/17  7:39 AM  Result Value Ref Range   TSH 3.197 0.350 - 4.500 uIU/mL    Comment: Performed by a 3rd Generation assay with a functional sensitivity of <=0.01 uIU/mL.  Troponin I     Status: Abnormal   Collection Time: 01/24/17  7:39 AM  Result Value Ref Range   Troponin I 0.04 (HH) <0.03 ng/mL    Comment: CRITICAL VALUE NOTED. VALUE IS CONSISTENT WITH PREVIOUSLY REPORTED/CALLED VALUE  SDR   Urinalysis, Complete w Microscopic     Status: Abnormal   Collection Time: 01/24/17  8:20 AM  Result Value Ref Range   Color, Urine YELLOW (A) YELLOW   APPearance HAZY (A) CLEAR   Specific Gravity, Urine 1.021 1.005 - 1.030   pH 6.0 5.0 - 8.0   Glucose, UA NEGATIVE NEGATIVE mg/dL   Hgb urine dipstick LARGE (A) NEGATIVE   Bilirubin Urine NEGATIVE NEGATIVE   Ketones, ur NEGATIVE NEGATIVE mg/dL   Protein, ur 30 (A) NEGATIVE mg/dL   Nitrite NEGATIVE NEGATIVE   Leukocytes, UA LARGE (A) NEGATIVE   RBC / HPF NONE SEEN 0 - 5 RBC/hpf   WBC, UA 0-5 0 - 5 WBC/hpf   Bacteria, UA NONE SEEN NONE SEEN   Squamous Epithelial / LPF NONE SEEN NONE SEEN  Glucose, capillary     Status: Abnormal   Collection Time: 01/24/17  9:15 AM  Result Value Ref Range   Glucose-Capillary 122 (H) 65 - 99 mg/dL   Ct Angio Abd/pel W And/or Wo Contrast  Result Date: 01/24/2017 CLINICAL DATA:  Generalized abdominal pain starting today. Vomiting. Loose bowel movements. History of ventricular a rib anemia. EXAM: CTA ABDOMEN AND PELVIS wITHOUT AND WITH CONTRAST TECHNIQUE: Multidetector CT imaging of the abdomen and pelvis was performed using the standard protocol during bolus administration of intravenous contrast. Multiplanar reconstructed images and MIPs were  obtained and reviewed to evaluate the vascular anatomy. CONTRAST:  100 mL Isovue 370 COMPARISON:  11/22/2016 FINDINGS: VASCULAR Aorta: Normal caliber aorta without aneurysm, dissection, vasculitis or significant stenosis. Diffuse aortic calcification. Celiac: Calcification at the origin of the celiac axis but the celiac axis and branch vessels are patent without evidence of significant stenosis. SMA: Calcification at the origin of the superior mesenteric artery with scattered calcification throughout. There is focal narrowing at the origin of the superior mesenteric artery. This represents 50% or greater diameter reduction. The vessel remains patent through this area of stenosis. There is a focal filling defect in 1 of the second or third order right mesenteric branch vessels resulting and 80-90% diameter reduction focally. This could represent thrombus or atherosclerotic plaque. The vessel remains patent distal to this area. Renals: Single bilateral renal arteries are patent with calcification at the origins. IMA: Inferior mesenteric artery is patent. Inflow: Patent without evidence of aneurysm, dissection, vasculitis or significant stenosis. Proximal Outflow: Bilateral common femoral and visualized portions of the superficial and profunda femoral arteries are patent without evidence of aneurysm, dissection, vasculitis or significant stenosis. Veins: No obvious venous abnormality within the limitations of this arterial phase study. Review of the MIP images confirms the above findings. NON-VASCULAR Lower chest: Small bilateral pleural effusions. Mosaic attenuation pattern in the lung bases suggesting edema or diffuse pneumonia. Cardiac enlargement. Pectus excavatum. Hepatobiliary: The gallbladder is distended. There is suggestion of small  stones in the dependent gallbladder. No bile duct dilatation. No focal liver lesions. Pancreas: Unremarkable. No pancreatic ductal dilatation or surrounding inflammatory changes.  Spleen: Calcified granulomas in the spleen.  No splenomegaly. Adrenals/Urinary Tract: No adrenal gland nodules. Multiple intrarenal stones bilaterally. Largest is in the right upper pole and measures about 5 mm in diameter. Bilateral renal cysts are incompletely characterized. No change since prior study. Low-attenuation zone in the upper pole right kidney is again demonstrated and incompletely characterized. This could be a focal defect due to infarct, infection, or mass lesion. As previously recommended on ultrasound 12/01/2016, MRI would be suggested in the elective setting for further evaluation. No hydronephrosis or hydroureter. The bladder wall is thickened and there is gas in the bladder. This suggests cystitis. There is a stone in the base of the bladder. Stomach/Bowel: Diffuse free intraperitoneal and a small amount of retroperitoneal air. Stomach is incompletely distended but there is evidence of gastric wall thickening. There is gas adjacent to the duodenum. This suggests the probability of a perforated duodenal ulcer. Small bowel and colon are not abnormally distended and no discrete areas of wall thickening or pneumatosis are demonstrated. Appendix is normal. Diverticulosis of the sigmoid colon without evidence of diverticulitis. Lymphatic: Aortic atherosclerosis. No enlarged abdominal or pelvic lymph nodes. Reproductive: Status post hysterectomy. No adnexal masses. Other: Surgical sutures in the anterior abdominal wall. Musculoskeletal: Degenerative changes diffusely throughout the lumbar spine. Severe degenerative changes in the left hip. No destructive bone lesions. IMPRESSION: VASCULAR 1. Diffuse aortic and vascular calcifications. 2. Stenosis at the origin of the celiac axis and superior mesenteric artery due to calcific stenosis. 3. Focal filling defect in a mid mesenteric SMA branch may represent thrombus or atherosclerotic plaque formation. NON-VASCULAR 1. Free intraperitoneal air and a small  amount of retroperitoneal air. Suggestion of gastric wall thickening with tear adjacent to the duodenum. High suspicion for perforated duodenal ulcer. No definite findings to suggest bowel ischemia. 2. Small bilateral pleural effusions. Airspace disease in the lungs probably represents edema. 3. Distended gallbladder with small stones. 4. Multiple bilateral nonobstructing intrarenal stones. 5. Hypoenhancing zone in the upper pole right kidney is unchanged since previous study. Differential diagnosis would include infarct, infection, or mass. As recommended on prior ultrasound, elective MRI is recommended for further evaluation. 6. Bladder wall thickening with gas in the bladder suggesting cystitis unless there has been instrumentation. Small bladder stone. 7. Calcified splenic granulomas. These results were called by telephone at the time of interpretation on 01/24/2017 at 6:24 am to Dr. Hinda Kehr , who verbally acknowledged these results. Electronically Signed   By: Lucienne Capers M.D.   On: 01/24/2017 06:28    Assessment/Plan:  Patient's daughter was on speaker phone during the consultation.  This a patient with a perforated ulcer seen on CT scan but she is completely and essentially asymptomatic at this point. With her elevated troponins and an NSTEMI likelihood I would not recommend general anesthesia for repair of a perforated ulcer at this time I would treat medically with PPI and antibiotics and continued observation. Her risk of dying during anesthesias quite high with a recent MI. We'll continue to follow.  Florene Glen, MD, FACS

## 2017-01-24 NOTE — Consult Note (Signed)
Cardiology Consultation:   Patient ID: KRISTE BROMAN; [id number]; 1934/11/14   Admit date: 01/24/2017 Date of Consult: 01/24/2017  Primary Care Provider: [name], MD Primary Cardiologist: Dr. Rockey Situ Primary Electrophysiologist:  None   Patient Profile:   Mia Padilla is a 82 y.o. female with a hx of paroxysmal atrial fibrillation not on anticoagulation, chronic diastolic heart failure, hypertension, diabetes mellitus, breast cancer, carotid artery stenosis, and COPD, who is being seen today for the evaluation of elevated troponin at the request of Dr. Verdell Carmine.  History of Present Illness:   Mia Padilla has noted intermittent abdominal pain and constipation over the last 2 months. She was seen in the ED for abdominal pain in early August and was advised to begin taking famotidine and Mira-Lax. She has continued to have intermittent abdominal pain that she is unable to describe, especially when needing to have a bowel movement. Yesterday, she became acutely nauseated and had two episodes of clear emesis. She subsequently presented to the ED, where CTA of the abdomen showed free air in the abdomen with concern for perforated duodenal ulcer. Narrowing of the mesenteric arteries (unclear if atherosclerotic plaque or thrombus) was also noted.  At this time, Mia Padilla reports feeling well. She has minimal abdominal pain and no further nausea. She has not had any chest pain but notes progressive exertional dyspnea over the last few weeks to where she now gets quite out of breath when just walking to her mailbox. She has also noted some progressive leg swelling though she feels like her weight has been stable. She remains compliant with her medications, including clopidogrel and furosemide. She has not undergone prior ischemia evaluation. She is also not an anticoagulation, as warfarin was discontinued in the past by her PCP due to gait instability. Of note, Mia Padilla has not had any falls or  bleeding.  Past Medical History:  Diagnosis Date  . Arthritis    hands  . Black head   . Bowel incontinence   . Breast cancer of upper-outer quadrant of left female breast ([place]) 2015   Left  . Carotid artery stenosis   . Carpal tunnel syndrome   . CHF (congestive heart failure) ([facility])    Pt. states dx about 5-10 years ago  . COPD (chronic obstructive pulmonary disease) ([place])   . Debilitated   . Diabetes mellitus without complication ([place])   . Glaucoma   . History of seizures   . Hyperlipidemia   . Hypertension   . Hypotension   . Obesity   . Urinary incontinence     Past Surgical History:  Procedure Laterality Date  . BOWEL RESECTION  2005  . BREAST SURGERY  08/03/13   left mastectomy  . CARPAL TUNNEL RELEASE Right 30 years   . MASTECTOMY     left   . PARTIAL HYSTERECTOMY    . TONSILLECTOMY    . UMBILICAL HERNIA REPAIR       Home Medications:  Prior to Admission medications   Medication Sig Start Date Daryle [place]ington Date Taking? Authorizing Provider  acetaminophen (TYLENOL) 500 MG tablet Take 500 mg by mouth every 6 (six) hours as needed.   Yes [provider]  albuterol (PROVENTIL HFA;VENTOLIN HFA) 108 (90 Base) MCG/ACT inhaler Inhale 2 puffs into the lungs every 6 (six) hours as needed for wheezing or shortness of breath. 05/08/16  Yes Lisa Roca, MD  amoxicillin-clavulanate (AUGMENTIN) 500-125 MG tablet Take 1 tablet by mouth 2 (two) times daily.  Yes [provider]  anastrozole (ARIMIDEX) 1 MG tablet Take 1 tablet (1 mg total) by mouth daily. 06/08/16  Yes Sankar, Seeplaputhur G, MD  brimonidine (ALPHAGAN P) 0.1 % SOLN 2 (two) times daily.    Yes [provider]  clopidogrel (PLAVIX) 75 MG tablet Take 1 tablet (75 mg total) by mouth daily. 11/19/16  Yes Crissman, Jeannette How, MD  cyanocobalamin (,VITAMIN B-12,) 1000 MCG/ML injection Inject 1 mL (1,000 mcg total) into the muscle every 30 (thirty) days. 07/16/16  Yes Volney American, PA-C  digoxin  (LANOXIN) 0.125 MG tablet Take 1 tablet (125 mcg total) by mouth daily. 11/19/16  Yes Crissman, Jeannette How, MD  dorzolamide (TRUSOPT) 2 % ophthalmic solution 1 drop 2 (two) times daily.    Yes [provider]  Fluticasone-Salmeterol (ADVAIR) 250-50 MCG/DOSE AEPB Inhale 1 puff into the lungs every 12 (twelve) hours. 08/18/16  Yes Crissman, Jeannette How, MD  furosemide (LASIX) 20 MG tablet Take 1 tablet (20 mg total) by mouth daily as needed. 05/12/16 05/12/17 Yes Gollan, Kathlene November, MD  latanoprost (XALATAN) 0.005 % ophthalmic solution Place 1 drop into both eyes at bedtime.   Yes [provider]  LEVEMIR FLEXTOUCH 100 UNIT/ML Pen INJECT 23 TO 30 UNITS PER DAY Patient taking differently: Inject 26 Units into the skin daily at 10 pm.  07/16/16  Yes Volney American, PA-C  levothyroxine (SYNTHROID, LEVOTHROID) 100 MCG tablet Take 1 tablet (100 mcg total) by mouth daily. 11/19/16  Yes Crissman, Jeannette How, MD  lisinopril (PRINIVIL,ZESTRIL) 10 MG tablet Take 1 tablet (10 mg total) by mouth daily. 08/18/16  Yes Crissman, Jeannette How, MD  meclizine (ANTIVERT) 25 MG tablet Take 1 tablet (25 mg total) by mouth 3 (three) times daily as needed. 05/18/16  Yes Crissman, Jeannette How, MD  metFORMIN (GLUCOPHAGE) 500 MG tablet Take 1 tablet (500 mg total) by mouth 2 (two) times daily with a meal. Patient taking differently: Take 500 mg by mouth daily with breakfast.  08/18/16  Yes Crissman, Jeannette How, MD  metoprolol succinate (TOPROL-XL) 100 MG 24 hr tablet Take 1 tablet (100 mg total) by mouth daily. 08/18/16  Yes Crissman, Jeannette How, MD  mometasone (ELOCON) 0.1 % cream Apply 1 application topically daily. Apply 1-2 times daily for no longer than 2 weeks at a time 11/24/16  Yes Orene Desanctis, Lilia Argue, PA-C  mupirocin ointment (BACTROBAN) 2 % Apply 1 application topically 2 (two) times daily. Do not use day in and day out 11/19/16  Yes Crissman, Jeannette How, MD  nystatin (MYCOSTATIN/NYSTOP) powder APPY TO THE AFFECTED AREA 4 TIMES DAILY 02/17/16   Yes Johnson, Megan P, DO  Olopatadine HCl (PATADAY) 0.2 % SOLN Apply 1 drop to eye daily.    Yes [provider]  solifenacin (VESICARE) 10 MG tablet Take 1 tablet (10 mg total) by mouth daily. 08/18/16  Yes Crissman, Jeannette How, MD  Vitamin D, Ergocalciferol, (DRISDOL) 50000 units CAPS capsule TAKE 1 CAPSULE BY MOUTH EVERY 7 DAYS. 02/17/16  Yes Johnson, Megan P, DO  benzonatate (TESSALON PERLES) 100 MG capsule Take 1 capsule (100 mg total) by mouth 3 (three) times daily as needed for cough. Patient not taking: Reported on 01/24/2017 05/18/16   [name], MD  blood glucose meter kit and supplies Diagnosis:E11.9 Test glucose twice daily 11/21/14   Park Liter P, DO  famotidine (PEPCID) 20 MG tablet Take 1 tablet (20 mg total) by mouth daily. Patient not taking: Reported on 11/24/2016 11/22/16 11/22/17  Nance Pear, MD  fluticasone [facility]) 50 MCG/ACT nasal spray Place 2 sprays into both nostrils daily. Patient not taking: Reported on 01/24/2017 10/14/15   Volney American, PA-C  glucose blood test strip Use as instructed 11/05/14   [name], MD    Inpatient Medications: Scheduled Meds: . anastrozole  1 mg Oral Daily  . brimonidine  1 drop Both Eyes BID  . clopidogrel  75 mg Oral Daily  . cyanocobalamin  1,000 mcg Intramuscular Q30 days  . digoxin  125 mcg Oral Daily  . docusate sodium  100 mg Oral BID  . dorzolamide  1 drop Both Eyes BID  . insulin aspart  0-9 Units Subcutaneous TID WC  . insulin detemir  15 Units Subcutaneous QHS  . latanoprost  1 drop Both Eyes QHS  . levothyroxine  100 mcg Oral QAC breakfast  . lisinopril  10 mg Oral Daily  . metoprolol succinate  100 mg Oral Daily  . mometasone-formoterol  2 puff Inhalation BID  . mupirocin ointment  1 application Topical BID  . nystatin   Topical TID  . olopatadine  1 drop Both Eyes BID  . pantoprazole (PROTONIX) IV  40 mg Intravenous Q12H   Continuous Infusions: . 0.9 % NaCl with KCl 40 mEq / L 100 mL/hr  (01/24/17 0844)  . piperacillin-tazobactam (ZOSYN)  IV 3.375 g (01/24/17 0844)   PRN Meds: acetaminophen **OR** acetaminophen, albuterol, furosemide, meclizine, ondansetron **OR** ondansetron (ZOFRAN) IV  Allergies:    Allergies  Allergen Reactions  . Propoxyphene Other (See Comments)    Other Reaction: Severe Headache  . Advil [Ibuprofen] Other (See Comments)    Makes her heart race  . Darvocet [Propoxyphene N-Acetaminophen]   . Percocet [Oxycodone-Acetaminophen]     hallucination    Social History:   Social History   Social History  . Marital status: Divorced    Spouse name: N/A  . Number of children: N/A  . Years of education: N/A   Occupational History  . Not on file.   Social History Main Topics  . Smoking status: Former Smoker    Quit date: 04/20/1974  . Smokeless tobacco: Never Used     Comment: Quit 1976  . Alcohol use No  . Drug use: No  . Sexual activity: Not on file   Other Topics Concern  . Not on file   Social History Narrative  . No narrative on file    Family History:    Family History  Problem Relation Age of Onset  . Cervical cancer Mother   . Diabetes Mother   . Glaucoma Mother   . Cervical cancer Daughter   . Hypertension Daughter   . Hypertension Father   . Hypertension Sister   . Hypertension Brother   . Hypertension Son   . Diabetes Brother   . HIV Brother   . Breast cancer Neg Hx      ROS:  Review of Systems  Constitution: Positive for chills and weakness. Negative for fever, weight gain and weight loss.  HENT: Negative.   Eyes: Negative.   Cardiovascular: Positive for claudication. Negative for chest pain.  Respiratory: Positive for shortness of breath. Negative for sleep disturbances due to breathing.   Endocrine: Negative.   Hematologic/Lymphatic: Negative.   Skin: Negative.   Musculoskeletal: Negative.   Gastrointestinal: Positive for abdominal pain, constipation, nausea and vomiting. Negative for hematemesis,  hematochezia and melena.  Genitourinary: Negative.   Psychiatric/Behavioral: Negative.   Allergic/Immunologic: Negative.  Physical Exam/Data:   Vitals:   01/24/17 0130 01/24/17 0327 01/24/17 0456 01/24/17 0623  BP:  118/67 120/75 (!) 153/70  Pulse:  100 (!) 101 (!) 103  Resp:  '13 20 18  '$ Temp:    97.8 F (36.6 C)  TempSrc:    Oral  SpO2:  100% 97% 98%  Weight: 192 lb (87.1 kg)     Height: '5\' 7"'$  (1.702 m)       Intake/Output Summary (Last 24 hours) at 01/24/17 1034 Last data filed at 01/24/17 0450  Gross per 24 hour  Intake              500 ml  Output                0 ml  Net              500 ml   Filed Weights   01/24/17 0130  Weight: 192 lb (87.1 kg)   Body mass index is 30.07 kg/m.  General:  Elderly woman, lying comfortably in bed. She is accompanied by her granddaughter. HEENT: normal Lymph: no adenopathy Neck: no JVP ~8 cm without HJR. Endocrine:  No thryomegaly Vascular: 2+ radial pulses; pedal pulses not palpable. Cardiac:  RRR with occasional extrasystoles and 1/6 systolic murmur loudest at the LLSB. Lungs:  Mildly diminished breath sounds throughout without wheezes or crackles.  Abd: Hypoactive bowel sounds. Soft with diffuse tenderness, most severe in the epigastric region. No rebound or guarding. No HSM. Ext: 1+ ankle edema bilaterally Musculoskeletal:  No deformities, BUE and BLE strength normal and equal Skin: warm and dry  Neuro:  CNs 2-12 intact, no focal abnormalities noted Psych:  Normal affect   EKG:  The EKG was personally reviewed and demonstrates:  Sinus tachycardia with PVCs, LAFB, poor R-wave progression, and non-specific ST changes. Telemetry:  Telemetry was personally reviewed and demonstrates:  Normal sinus rhythm and sinus tachycardia with PACs and PVCs.  Relevant CV Studies: None  Laboratory Data:  Chemistry  Recent Labs Lab 01/24/17 0156  NA 138  K 3.4*  CL 99*  CO2 29  GLUCOSE 155*  BUN 13  CREATININE 0.97  CALCIUM  8.8*  GFRNONAA 54*  GFRAA >60  ANIONGAP 10     Recent Labs Lab 01/24/17 0156  PROT 9.4*  ALBUMIN 3.6  AST 26  ALT 13*  ALKPHOS 51  BILITOT 1.1   Hematology  Recent Labs Lab 01/24/17 0156  WBC 6.1  RBC 3.89  HGB 12.4  HCT 37.3  MCV 95.7  MCH 31.8  MCHC 33.2  RDW 16.2*  PLT 160   Cardiac Enzymes  Recent Labs Lab 01/24/17 0156 01/24/17 0739  TROPONINI 0.05* 0.04*   No results for input(s): TROPIPOC in the last 168 hours.  BNPNo results for input(s): BNP, PROBNP in the last 168 hours.  DDimer No results for input(s): DDIMER in the last 168 hours.  Radiology/Studies:  Ct Angio Abd/pel W And/or Wo Contrast  Result Date: 01/24/2017 CLINICAL DATA:  Generalized abdominal pain starting today. Vomiting. Loose bowel movements. History of ventricular a rib anemia. EXAM: CTA ABDOMEN AND PELVIS wITHOUT AND WITH CONTRAST TECHNIQUE: Multidetector CT imaging of the abdomen and pelvis was performed using the standard protocol during bolus administration of intravenous contrast. Multiplanar reconstructed images and MIPs were obtained and reviewed to evaluate the vascular anatomy. CONTRAST:  100 mL Isovue 370 COMPARISON:  11/22/2016 FINDINGS: VASCULAR Aorta: Normal caliber aorta without aneurysm, dissection, vasculitis or significant stenosis. Diffuse aortic calcification.  Celiac: Calcification at the origin of the celiac axis but the celiac axis and branch vessels are patent without evidence of significant stenosis. SMA: Calcification at the origin of the superior mesenteric artery with scattered calcification throughout. There is focal narrowing at the origin of the superior mesenteric artery. This represents 50% or greater diameter reduction. The vessel remains patent through this area of stenosis. There is a focal filling defect in 1 of the second or third order right mesenteric branch vessels resulting and 80-90% diameter reduction focally. This could represent thrombus or  atherosclerotic plaque. The vessel remains patent distal to this area. Renals: Single bilateral renal arteries are patent with calcification at the origins. IMA: Inferior mesenteric artery is patent. Inflow: Patent without evidence of aneurysm, dissection, vasculitis or significant stenosis. Proximal Outflow: Bilateral common femoral and visualized portions of the superficial and profunda femoral arteries are patent without evidence of aneurysm, dissection, vasculitis or significant stenosis. Veins: No obvious venous abnormality within the limitations of this arterial phase study. Review of the MIP images confirms the above findings. NON-VASCULAR Lower chest: Small bilateral pleural effusions. Mosaic attenuation pattern in the lung bases suggesting edema or diffuse pneumonia. Cardiac enlargement. Pectus excavatum. Hepatobiliary: The gallbladder is distended. There is suggestion of small stones in the dependent gallbladder. No bile duct dilatation. No focal liver lesions. Pancreas: Unremarkable. No pancreatic ductal dilatation or surrounding inflammatory changes. Spleen: Calcified granulomas in the spleen.  No splenomegaly. Adrenals/Urinary Tract: No adrenal gland nodules. Multiple intrarenal stones bilaterally. Largest is in the right upper pole and measures about 5 mm in diameter. Bilateral renal cysts are incompletely characterized. No change since prior study. Low-attenuation zone in the upper pole right kidney is again demonstrated and incompletely characterized. This could be a focal defect due to infarct, infection, or mass lesion. As previously recommended on ultrasound 12/01/2016, MRI would be suggested in the elective setting for further evaluation. No hydronephrosis or hydroureter. The bladder wall is thickened and there is gas in the bladder. This suggests cystitis. There is a stone in the base of the bladder. Stomach/Bowel: Diffuse free intraperitoneal and a small amount of retroperitoneal air. Stomach  is incompletely distended but there is evidence of gastric wall thickening. There is gas adjacent to the duodenum. This suggests the probability of a perforated duodenal ulcer. Small bowel and colon are not abnormally distended and no discrete areas of wall thickening or pneumatosis are demonstrated. Appendix is normal. Diverticulosis of the sigmoid colon without evidence of diverticulitis. Lymphatic: Aortic atherosclerosis. No enlarged abdominal or pelvic lymph nodes. Reproductive: Status post hysterectomy. No adnexal masses. Other: Surgical sutures in the anterior abdominal wall. Musculoskeletal: Degenerative changes diffusely throughout the lumbar spine. Severe degenerative changes in the left hip. No destructive bone lesions. IMPRESSION: VASCULAR 1. Diffuse aortic and vascular calcifications. 2. Stenosis at the origin of the celiac axis and superior mesenteric artery due to calcific stenosis. 3. Focal filling defect in a mid mesenteric SMA branch may represent thrombus or atherosclerotic plaque formation. NON-VASCULAR 1. Free intraperitoneal air and a small amount of retroperitoneal air. Suggestion of gastric wall thickening with tear adjacent to the duodenum. High suspicion for perforated duodenal ulcer. No definite findings to suggest bowel ischemia. 2. Small bilateral pleural effusions. Airspace disease in the lungs probably represents edema. 3. Distended gallbladder with small stones. 4. Multiple bilateral nonobstructing intrarenal stones. 5. Hypoenhancing zone in the upper pole right kidney is unchanged since previous study. Differential diagnosis would include infarct, infection, or mass. As recommended on prior ultrasound,  elective MRI is recommended for further evaluation. 6. Bladder wall thickening with gas in the bladder suggesting cystitis unless there has been instrumentation. Small bladder stone. 7. Calcified splenic granulomas. These results were called by telephone at the time of interpretation on  01/24/2017 at 6:24 am to Dr. Hinda Kehr , who verbally acknowledged these results. Electronically Signed   By: Lucienne Capers M.D.   On: 01/24/2017 06:28    Assessment and Plan:   Perforated duodenal ulcer Patient presenting abdominal pain and nausea/vomiting are most likely due to her perforated duodenal ulcer. I do not believe that the symptoms represent an anginal equivalent. I have spoken with Dr. Burt Knack, and we have agreed to continue with conservative management for the time being.  Continue PPI and antibiotics per surgery and internal medicine.  If the patient has worsening pain or develops signs of sepsis/peritonitis, emergent surgery should not be delayed from a cardiac standpoint.  Elevated troponin Troponin elevation is minimal in the setting of acute intraabdominal process, likely representing supply-demand mismatch. Stable troponin of 0.05->0.04 is not indicative of an acute plaque rupture (type 1) myocardial infarction. EKG findings are non-specific. However, Mia Padilla reports some progressive shortness of breath, which could be an anginal equivalent or reflect worsening heart failure.  Proceed with echocardiogram.  If echo shows relatively preserved LV function without obvious wall-motion abnormality, consider pharmacologic myocardial perfusion stress test tomorrow for further risk stratification.  Hold clopidogrel and begin heparin infusion. Defer adding aspirin in the setting of perforated duodenal ulcer.  Start atorvastatin 20 mg daily.  Acute on chronic diastolic heart failure Patient reports progressive shortness of breath and edema over the last few weeks. Her weight is up only 2 pounds since her last visit with Dr. Rockey Situ in July.  Proceed with echo, as planned.  In the setting of patient being NPO and having increased insensible losses from perforated duodenal ulcer, I think it is reasonable to hold furosemide.  Paroxysmal atrial fibrillation Patient is in  sinus rhythm today. Certainly concern for mesenteric artery thrombus raises the possibility of a cardioembolic event. Patient's CHADSVASC score is 6 (higher if embolism to SMA is consider a stroke-equivalent).  Start heparin infusion.  Recommend consideration of long-term anticoagulation as an outpatient, as patient denies having ever fallen. I suspect that the bleeding risk with clopidogrel is similar, though P2Y12 inhibitors have been shown to be much less effective in preventing emboli in the setting of atrial fibrillation.  Hold digoxin, and check digoxin level.  Continue metoprolol succinate 100 mg daily.  Mesenteric artery stenosis Atherosclerosis versus thrombus noted on CTA of the abdomen. No lactic acid has been checked. Patient currently without significant abdominal pain, though tenderness is present.  Heparin infusion, as above.  Atorvastatin.  Further management per vascular surgery.  Hypertension BP mildly elevated.  Continue metoprolol and lisinopril, though low threshold to discontinue lisinopril if BP begins to fall in the setting of perforated abdominal viscous.  Hypokalemia and hypomagnesemia PACs and PVCs noted on telemetry.  Replete K and Mg per primary service for goal greater than 4.0 and 2.0, respectively.   Signed, Nelva Bush, MD  01/24/2017 10:34 AM

## 2017-01-25 DIAGNOSIS — I248 Other forms of acute ischemic heart disease: Secondary | ICD-10-CM

## 2017-01-25 DIAGNOSIS — K261 Acute duodenal ulcer with perforation: Secondary | ICD-10-CM

## 2017-01-25 LAB — BASIC METABOLIC PANEL
ANION GAP: 8 (ref 5–15)
BUN: 11 mg/dL (ref 6–20)
CHLORIDE: 104 mmol/L (ref 101–111)
CO2: 26 mmol/L (ref 22–32)
Calcium: 8.1 mg/dL — ABNORMAL LOW (ref 8.9–10.3)
Creatinine, Ser: 0.97 mg/dL (ref 0.44–1.00)
GFR calc non Af Amer: 54 mL/min — ABNORMAL LOW (ref >60)
Glucose, Bld: 99 mg/dL (ref 65–99)
Potassium: 3.6 mmol/L (ref 3.5–5.1)
Sodium: 138 mmol/L (ref 135–145)

## 2017-01-25 LAB — CBC
HEMATOCRIT: 30.5 % — AB (ref 35.0–47.0)
HEMOGLOBIN: 10.2 g/dL — AB (ref 12.0–16.0)
MCH: 31.6 pg (ref 26.0–34.0)
MCHC: 33.5 g/dL (ref 32.0–36.0)
MCV: 94.2 fL (ref 80.0–100.0)
Platelets: 128 10*3/uL — ABNORMAL LOW (ref 150–440)
RBC: 3.23 MIL/uL — ABNORMAL LOW (ref 3.80–5.20)
RDW: 16.5 % — ABNORMAL HIGH (ref 11.5–14.5)
WBC: 3.4 10*3/uL — ABNORMAL LOW (ref 3.6–11.0)

## 2017-01-25 LAB — MAGNESIUM: MAGNESIUM: 1.6 mg/dL — AB (ref 1.7–2.4)

## 2017-01-25 LAB — HEPARIN LEVEL (UNFRACTIONATED)
HEPARIN UNFRACTIONATED: 0.41 [IU]/mL (ref 0.30–0.70)
Heparin Unfractionated: 0.49 IU/mL (ref 0.30–0.70)

## 2017-01-25 LAB — HEMOGLOBIN A1C
Hgb A1c MFr Bld: 5.8 % — ABNORMAL HIGH (ref 4.8–5.6)
Mean Plasma Glucose: 119.76 mg/dL

## 2017-01-25 LAB — GLUCOSE, CAPILLARY
Glucose-Capillary: 96 mg/dL (ref 65–99)
Glucose-Capillary: 93 mg/dL (ref 65–99)
Glucose-Capillary: 79 mg/dL (ref 65–99)
Glucose-Capillary: 75 mg/dL (ref 65–99)

## 2017-01-25 MED ORDER — FUROSEMIDE 10 MG/ML IJ SOLN
20.0000 mg | Freq: Two times a day (BID) | INTRAMUSCULAR | Status: DC
  Administered 2017-01-25 – 2017-01-27 (×5): 20 mg via INTRAVENOUS
  Filled 2017-01-25 (×5): qty 2

## 2017-01-25 MED ORDER — ATORVASTATIN CALCIUM 20 MG PO TABS
20.0000 mg | ORAL_TABLET | Freq: Every day | ORAL | Status: DC
  Administered 2017-01-25 – 2017-01-30 (×6): 20 mg via ORAL
  Filled 2017-01-25 (×7): qty 1

## 2017-01-25 MED ORDER — FLUTICASONE PROPIONATE 50 MCG/ACT NA SUSP
1.0000 | Freq: Every day | NASAL | Status: DC
  Administered 2017-01-25 – 2017-01-31 (×7): 1 via NASAL
  Filled 2017-01-25: qty 16

## 2017-01-25 MED ORDER — DEXTROSE 50 % IV SOLN
1.0000 | INTRAVENOUS | Status: DC | PRN
  Administered 2017-01-27: 50 mL via INTRAVENOUS
  Filled 2017-01-25: qty 50

## 2017-01-25 NOTE — Progress Notes (Signed)
[place] for heparin drip Indication: chest pain/ACS/NSTEMI  Allergies  Allergen Reactions  . Propoxyphene Other (See Comments)    Other Reaction: Severe Headache  . Advil [Ibuprofen] Other (See Comments)    Makes her heart race  . Darvocet [Propoxyphene N-Acetaminophen]   . Percocet [Oxycodone-Acetaminophen]     hallucination    Patient Measurements: Height: 5\' 7"  (170.2 cm) Weight: 191 lb 14.4 oz (87 kg) IBW/kg (Calculated) : 61.6 Heparin Dosing Weight: 80 kg  Vital Signs: Temp: 98.3 F (36.8 C) (10/08 0542) Temp Source: Oral (10/08 0542) BP: 107/72 (10/08 0542) Pulse Rate: 86 (10/08 0542)  Labs:  Recent Labs  01/24/17 0156 01/24/17 0739 01/24/17 1326 01/24/17 1957 01/25/17 0606  HGB 12.4  --   --   --  10.2*  HCT 37.3  --   --   --  30.5*  PLT 160  --   --   --  128*  APTT 27  --   --   --   --   LABPROT 18.6*  --   --   --   --   INR 1.57  --   --   --   --   HEPARINUNFRC  --   --   --  0.29* 0.41  CREATININE 0.97  --   --   --   --   TROPONINI 0.05* 0.04* 0.05* 0.05*  --     Estimated Creatinine Clearance: 52.4 mL/min (by C-G formula based on SCr of 0.97 mg/dL).  Medications:  No anticoagulation in PTA meds.  Assessment: 82 yo female started on heparin drip for NSTEM was stopped, now being restarted. Possible mesenteric artery thrombus/stenosis noted on CT scan as well.  Goal of Therapy:  Heparin level 0.3-0.7 units/ml Monitor platelets by anticoagulation protocol: Yes   Plan:  Restart heparin drip at 950 units/hr (=9.5 ml/hr). First heparin level 8 hours after restart of infusion. CBC in AM  10/7 1957 HL subtherapeutic x 1. 1200 units IV x 1 bolus and increase rate to 1100 units/hr. Recheck HL in 8 hours.   10/8 0606 HL Therapeutic x 1. Will continue current rate of 1100units/hr and recheck HL in 8 hours.   Pernell Dupre, PharmD, BCPS Clinical Pharmacist 01/25/2017 8:03 AM

## 2017-01-25 NOTE — Progress Notes (Signed)
SURGICAL PROGRESS NOTE (cpt [phone])  Hospital Day(s): 1.   Post op day(s):  Mia Padilla   Interval History: Patient seen and examined, no acute events or new complaints overnight. Patient reports she feels well with +flatus, denies abdominal pain, N/V, GERD, fever/chills, CP, or SOB.  Review of Systems:  Constitutional: denies fever, chills  HEENT: denies cough or congestion  Respiratory: denies any shortness of breath  Cardiovascular: denies chest pain or palpitations  Gastrointestinal: abdominal pain, N/V, and bowel function as per interval history Genitourinary: denies burning with urination or urinary frequency Musculoskeletal: denies pain, decreased motor or sensation Integumentary: denies any other rashes or skin discolorations Neurological: denies HA or vision/hearing changes   Vital signs in last 24 hours: [min-max] current  Temp:  [97.8 F (36.6 C)-98.3 F (36.8 C)] 98.3 F (36.8 C) (10/08 0542) Pulse Rate:  [86-94] 86 (10/08 0542) Resp:  [19] 19 (10/08 0542) BP: (107-114)/(46-72) 107/72 (10/08 0542) SpO2:  [93 %-96 %] 95 % (10/08 0542) Weight:  [191 lb 14.4 oz (87 kg)] 191 lb 14.4 oz (87 kg) (10/08 0542)     Height: 5\' 7"  (170.2 cm) Weight: 191 lb 14.4 oz (87 kg) BMI (Calculated): 30.05   Intake/Output this shift:  No intake/output data recorded.   Intake/Output last 2 shifts:  @IOLAST2SHIFTS @   Physical Exam:  Constitutional: alert, cooperative and no distress  HENT: normocephalic without obvious abnormality  Eyes: PERRL, EOM's grossly intact and symmetric  Neuro: CN II - XII grossly intact and symmetric without deficit  Respiratory: breathing non-labored at rest  Cardiovascular: regular rate and sinus rhythm  Gastrointestinal: soft, minimal to no epigastric tenderness to palpation, and non-distended Musculoskeletal: UE and LE FROM, motor and sensation grossly intact, NT   Labs:  CBC Latest Ref Rng & Units 01/25/2017 01/24/2017 11/22/2016  WBC 3.6 - 11.0 K/uL 3.4(L)  6.1 4.7  Hemoglobin 12.0 - 16.0 g/dL 10.2(L) 12.4 13.3  Hematocrit 35.0 - 47.0 % 30.5(L) 37.3 40.4  Platelets 150 - 440 K/uL 128(L) 160 113(L)   CMP Latest Ref Rng & Units 01/24/2017 11/22/2016 11/19/2016  Glucose 65 - 99 mg/dL 155(H) 126(H) 112(H)  BUN 6 - 20 mg/dL 13 12 13   Creatinine 0.44 - 1.00 mg/dL 0.97 0.63 0.81  Sodium 135 - 145 mmol/L 138 131(L) 136  Potassium 3.5 - 5.1 mmol/L 3.4(L) 3.7 4.6  Chloride 101 - 111 mmol/L 99(L) 93(L) 97  CO2 22 - 32 mmol/L 29 26 22   Calcium 8.9 - 10.3 mg/dL 8.8(L) 8.6(L) 8.9  Total Protein 6.5 - 8.1 g/dL 9.4(H) 8.9(H) 8.7(H)  Total Bilirubin 0.3 - 1.2 mg/dL 1.1 1.6(H) 0.8  Alkaline Phos 38 - 126 U/L 51 62 58  AST 15 - 41 U/L 26 36 25  ALT 14 - 54 U/L 13(L) 23 13   Imaging studies: No new pertinent imaging studies   Assessment/Plan: (ICD-10's: K26.1) 82 y.o. female with asymptomatic perforated duodenal ulcer being managed non-operatively due to elevated Troponin initially concerning for NSTEMI, along with therapeutic heparin infusion and Plavix for SMA embolus attributed to atrial fibrillation no longer on anticoagulation (at time of adission) due to patient fall risk, CHF with LV dysfunction, and complicated also by pertinent comorbidities including obesity (BMI >30), DM, HTN, HLD, CAD with atrial fibrillation, CHF with LV dysfunction, COPD, hypothyroidism, seizure disorder, history of breast cancer on Arimidex, advanced age, and bowel + urinary incontinence.   - NPO for now, IVF  - PPI, antibiotics, and therapeutic anticoagulation  - check upper GI / contrast  swallow study tomorrow  - consider advancement to clear liquids diet pending swallow study to assess perforated duodenal ulcer  - continue non-operative management considering asymptomatic with SMA embolus, elevated troponin, and comorbidities  - medical management of comorbidities per medical team  - outpatient cardiac stress testing as per cardiology  - monitor abdominal exam and bowel  function  All of the above findings and recommendations were discussed with the patient, and all of patient's questions were answered to her expressed satisfaction.  Thank you for the opportunity to participate in this patient's care.  -- Marilynne Drivers Rosana Hoes, MD, [place]: [facility] - Partnering for exceptional care. Office: [phone]

## 2017-01-25 NOTE — Progress Notes (Signed)
[place] for heparin drip Indication: chest pain/ACS/NSTEMI  Allergies  Allergen Reactions  . Propoxyphene Other (See Comments)    Other Reaction: Severe Headache  . Advil [Ibuprofen] Other (See Comments)    Makes her heart race  . Darvocet [Propoxyphene N-Acetaminophen]   . Percocet [Oxycodone-Acetaminophen]     hallucination    Patient Measurements: Height: 5\' 7"  (170.2 cm) Weight: 191 lb 14.4 oz (87 kg) IBW/kg (Calculated) : 61.6 Heparin Dosing Weight: 80 kg  Vital Signs: Temp: 97.9 F (36.6 C) (10/08 1234) Temp Source: Oral (10/08 1234) BP: 133/64 (10/08 1234) Pulse Rate: 85 (10/08 1234)  Labs:  Recent Labs  01/24/17 0156 01/24/17 0739 01/24/17 1326 01/24/17 1957 01/25/17 0606 01/25/17 1345  HGB 12.4  --   --   --  10.2*  --   HCT 37.3  --   --   --  30.5*  --   PLT 160  --   --   --  128*  --   APTT 27  --   --   --   --   --   LABPROT 18.6*  --   --   --   --   --   INR 1.57  --   --   --   --   --   HEPARINUNFRC  --   --   --  0.29* 0.41 0.49  CREATININE 0.97  --   --   --  0.97  --   TROPONINI 0.05* 0.04* 0.05* 0.05*  --   --     Estimated Creatinine Clearance: 52.4 mL/min (by C-G formula based on SCr of 0.97 mg/dL).  Medications:  No anticoagulation in PTA meds.  Assessment: 82 yo female started on heparin drip for NSTEM was stopped, now being restarted. Possible mesenteric artery thrombus/stenosis noted on CT scan as well.  Goal of Therapy:  Heparin level 0.3-0.7 units/ml Monitor platelets by anticoagulation protocol: Yes   Plan:  Restart heparin drip at 950 units/hr (=9.5 ml/hr). First heparin level 8 hours after restart of infusion. CBC in AM  10/7 1957 HL subtherapeutic x 1. 1200 units IV x 1 bolus and increase rate to 1100 units/hr. Recheck HL in 8 hours.   10/8 0606 HL Therapeutic x 1 at 0.41. Will continue current rate of 1100units/hr and recheck HL in 8 hours.   10/8 1345 HL Therapeutic x 2 at  0.49. Will continue current rate of 1100units/hr and recheck HL with AM labs. CBC daily while on heparin per protocol.   Pernell Dupre, PharmD, BCPS Clinical Pharmacist 01/25/2017 2:30 PM

## 2017-01-25 NOTE — Progress Notes (Signed)
[place] at [place] NAME: Everett Ricciardelli    MR#:  [id number]  DATE OF BIRTH:  Feb 25, 1935  SUBJECTIVE:   Patient denies any abdominal pain or chest pain this morning. No nausea, vomiting. No other acute events overnight.  REVIEW OF SYSTEMS:    Review of Systems  Constitutional: Negative for chills and fever.  HENT: Negative for congestion and tinnitus.   Eyes: Negative for blurred vision and double vision.  Respiratory: Negative for cough, shortness of breath and wheezing.   Cardiovascular: Negative for chest pain, orthopnea and PND.  Gastrointestinal: Negative for abdominal pain, diarrhea, nausea and vomiting.  Genitourinary: Negative for dysuria and hematuria.  Neurological: Negative for dizziness, sensory change and focal weakness.  All other systems reviewed and are negative.   Nutrition: NPO Tolerating Diet: No Tolerating PT: Await Eval.      DRUG ALLERGIES:   Allergies  Allergen Reactions  . Propoxyphene Other (See Comments)    Other Reaction: Severe Headache  . Advil [Ibuprofen] Other (See Comments)    Makes her heart race  . Darvocet [Propoxyphene N-Acetaminophen]   . Percocet [Oxycodone-Acetaminophen]     hallucination    VITALS:  Blood pressure 133/64, pulse 85, temperature 97.9 F (36.6 C), temperature source Oral, resp. rate 16, height 5\' 7"  (1.702 m), weight 87 kg (191 lb 14.4 oz), SpO2 100 %.  PHYSICAL EXAMINATION:   Physical Exam  GENERAL:  82 y.o.-year-old patient lying in bed in no acute distress.  EYES: Pupils equal, round, reactive to light and accommodation. No scleral icterus. Extraocular muscles intact.  HEENT: Head atraumatic, normocephalic. Oropharynx and nasopharynx clear.  NECK:  Supple, no jugular venous distention. No thyroid enlargement, no tenderness.  LUNGS: Normal breath sounds bilaterally, no wheezing, rales, rhonchi. No use of accessory muscles of respiration.  CARDIOVASCULAR: S1, S2  normal. No murmurs, rubs, or gallops.  ABDOMEN: Soft, nontender, nondistended. Bowel sounds present. No organomegaly or mass.  EXTREMITIES: No cyanosis, clubbing or edema b/l.    NEUROLOGIC: Cranial nerves II through XII are intact. No focal Motor or sensory deficits b/l.   PSYCHIATRIC: The patient is alert and oriented x 3.  SKIN: No obvious rash, lesion, or ulcer.    LABORATORY PANEL:   CBC  Recent Labs Lab 01/25/17 0606  WBC 3.4*  HGB 10.2*  HCT 30.5*  PLT 128*   ------------------------------------------------------------------------------------------------------------------  Chemistries   Recent Labs Lab 01/24/17 0156 01/25/17 0606  NA 138 138  K 3.4* 3.6  CL 99* 104  CO2 29 26  GLUCOSE 155* 99  BUN 13 11  CREATININE 0.97 0.97  CALCIUM 8.8* 8.1*  MG 1.2* 1.6*  AST 26  --   ALT 13*  --   ALKPHOS 51  --   BILITOT 1.1  --    ------------------------------------------------------------------------------------------------------------------  Cardiac Enzymes  Recent Labs Lab 01/24/17 1957  TROPONINI 0.05*   ------------------------------------------------------------------------------------------------------------------  RADIOLOGY:  Ct Angio Abd/pel W And/or Wo Contrast  Result Date: 01/24/2017 CLINICAL DATA:  Generalized abdominal pain starting today. Vomiting. Loose bowel movements. History of ventricular a rib anemia. EXAM: CTA ABDOMEN AND PELVIS wITHOUT AND WITH CONTRAST TECHNIQUE: Multidetector CT imaging of the abdomen and pelvis was performed using the standard protocol during bolus administration of intravenous contrast. Multiplanar reconstructed images and MIPs were obtained and reviewed to evaluate the vascular anatomy. CONTRAST:  100 mL Isovue 370 COMPARISON:  11/22/2016 FINDINGS: VASCULAR Aorta: Normal caliber aorta without aneurysm, dissection, vasculitis or significant stenosis. Diffuse aortic  calcification. Celiac: Calcification at the origin of  the celiac axis but the celiac axis and branch vessels are patent without evidence of significant stenosis. SMA: Calcification at the origin of the superior mesenteric artery with scattered calcification throughout. There is focal narrowing at the origin of the superior mesenteric artery. This represents 50% or greater diameter reduction. The vessel remains patent through this area of stenosis. There is a focal filling defect in 1 of the second or third order right mesenteric branch vessels resulting and 80-90% diameter reduction focally. This could represent thrombus or atherosclerotic plaque. The vessel remains patent distal to this area. Renals: Single bilateral renal arteries are patent with calcification at the origins. IMA: Inferior mesenteric artery is patent. Inflow: Patent without evidence of aneurysm, dissection, vasculitis or significant stenosis. Proximal Outflow: Bilateral common femoral and visualized portions of the superficial and profunda femoral arteries are patent without evidence of aneurysm, dissection, vasculitis or significant stenosis. Veins: No obvious venous abnormality within the limitations of this arterial phase study. Review of the MIP images confirms the above findings. NON-VASCULAR Lower chest: Small bilateral pleural effusions. Mosaic attenuation pattern in the lung bases suggesting edema or diffuse pneumonia. Cardiac enlargement. Pectus excavatum. Hepatobiliary: The gallbladder is distended. There is suggestion of small stones in the dependent gallbladder. No bile duct dilatation. No focal liver lesions. Pancreas: Unremarkable. No pancreatic ductal dilatation or surrounding inflammatory changes. Spleen: Calcified granulomas in the spleen.  No splenomegaly. Adrenals/Urinary Tract: No adrenal gland nodules. Multiple intrarenal stones bilaterally. Largest is in the right upper pole and measures about 5 mm in diameter. Bilateral renal cysts are incompletely characterized. No change  since prior study. Low-attenuation zone in the upper pole right kidney is again demonstrated and incompletely characterized. This could be a focal defect due to infarct, infection, or mass lesion. As previously recommended on ultrasound 12/01/2016, MRI would be suggested in the elective setting for further evaluation. No hydronephrosis or hydroureter. The bladder wall is thickened and there is gas in the bladder. This suggests cystitis. There is a stone in the base of the bladder. Stomach/Bowel: Diffuse free intraperitoneal and a small amount of retroperitoneal air. Stomach is incompletely distended but there is evidence of gastric wall thickening. There is gas adjacent to the duodenum. This suggests the probability of a perforated duodenal ulcer. Small bowel and colon are not abnormally distended and no discrete areas of wall thickening or pneumatosis are demonstrated. Appendix is normal. Diverticulosis of the sigmoid colon without evidence of diverticulitis. Lymphatic: Aortic atherosclerosis. No enlarged abdominal or pelvic lymph nodes. Reproductive: Status post hysterectomy. No adnexal masses. Other: Surgical sutures in the anterior abdominal wall. Musculoskeletal: Degenerative changes diffusely throughout the lumbar spine. Severe degenerative changes in the left hip. No destructive bone lesions. IMPRESSION: VASCULAR 1. Diffuse aortic and vascular calcifications. 2. Stenosis at the origin of the celiac axis and superior mesenteric artery due to calcific stenosis. 3. Focal filling defect in a mid mesenteric SMA branch may represent thrombus or atherosclerotic plaque formation. NON-VASCULAR 1. Free intraperitoneal air and a small amount of retroperitoneal air. Suggestion of gastric wall thickening with tear adjacent to the duodenum. High suspicion for perforated duodenal ulcer. No definite findings to suggest bowel ischemia. 2. Small bilateral pleural effusions. Airspace disease in the lungs probably represents  edema. 3. Distended gallbladder with small stones. 4. Multiple bilateral nonobstructing intrarenal stones. 5. Hypoenhancing zone in the upper pole right kidney is unchanged since previous study. Differential diagnosis would include infarct, infection, or mass. As recommended on  prior ultrasound, elective MRI is recommended for further evaluation. 6. Bladder wall thickening with gas in the bladder suggesting cystitis unless there has been instrumentation. Small bladder stone. 7. Calcified splenic granulomas. These results were called by telephone at the time of interpretation on 01/24/2017 at 6:24 am to Dr. Hinda Kehr , who verbally acknowledged these results. Electronically Signed   By: Lucienne Capers M.D.   On: 01/24/2017 06:28     ASSESSMENT AND PLAN:   82 year old female with past medical history of hypertension, hyperlipidemia, diabetes, COPD, coronary artery stenosis with history of breast cancer presented to the hospital due to abdominal pain and noted to have a perforated viscus on CT scan. Patient was also noted to have a mildly elevated troponin.  1. Abdominal pain-secondary to a perforated viscus is seen on CT scan of the abdomen and pelvis. Patient Remains clinically asymptomatic. -Discussed with general surgery who will come and evaluate patient to see if patient be started on diet. Continue supportive care with IV fluids, pain control, IV Zosyn.  2. Elevated troponin-secondary to supply demand ischemia, no evidence of acute coronary syndrome. -Troponins did not trend upwards.  -Patient's echocardiogram did show LV dysfunction and cardiology recommends outpatient stress test for risk stratification. -Continue empiric heparin drip, atorvastatin, metoprolol.  3. 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 A vascular surgery consult obtained. They recommended continuing heparin, Plavix. No plans for angiogram or any other  further intervention. Patient is clinically asymptomatic.  4. Hypothyroidism-continue Synthroid.  5. Diabetes type 2 without compensation-continue Levemir, sliding scale insulin.  6. Glaucoma-continue dorzolamide, latanoprost eyedrops.  7. COPD-no acute exacerbation. Continue Dulera  8. Hx of Breast Cancer - cont. Arimidex.   9. Hx of paroxysmal atrial fibrillation-rate controlled. Continue metoprolol. Patient currently is on heparin drip and cardiology recommends switching her to oral Eliquis tomorrow. Patient previously used to be on warfarin but was discontinued due to high fall risk and as per cardiology the benefits from anticoagulation outweigh the risks.   All the records are reviewed and case discussed with Care Management/Social Worker. Management plans discussed with the patient, family and they are in agreement.  CODE STATUS: Full code  DVT Prophylaxis: Hep. gtt  TOTAL TIME TAKING CARE OF THIS PATIENT: 30 minutes.   POSSIBLE D/C IN 1-2 DAYS, DEPENDING ON CLINICAL CONDITION.   Henreitta Leber M.D on 01/25/2017 at 2:38 PM  Between 7am to 6pm - Pager - [phone]  After 6pm go to [email or web address] - Proofreader  [facility]  Office  [phone]  CC: Primary care physician; [name], MD

## 2017-01-25 NOTE — Progress Notes (Signed)
Progress Note  Patient Name: Mia Padilla Date of Encounter: 01/25/2017  Primary Cardiologist: Johnny Bridge, MD   Subjective   Abdomen feeling much better.  Still tender but overall improved.  No chest pain.  Felt sob last night but didn't tell anyone  got better.  Inpatient Medications    Scheduled Meds: . anastrozole  1 mg Oral Daily  . brimonidine  1 drop Both Eyes BID  . cyanocobalamin  1,000 mcg Intramuscular Q30 days  . docusate sodium  100 mg Oral BID  . dorzolamide  1 drop Both Eyes BID  . insulin aspart  0-9 Units Subcutaneous TID WC  . insulin detemir  15 Units Subcutaneous QHS  . latanoprost  1 drop Both Eyes QHS  . levothyroxine  100 mcg Oral QAC breakfast  . metoprolol succinate  100 mg Oral Daily  . mometasone-formoterol  2 puff Inhalation BID  . mupirocin ointment  1 application Topical BID  . nystatin   Topical TID  . olopatadine  1 drop Both Eyes BID  . pantoprazole (PROTONIX) IV  40 mg Intravenous Q12H   Continuous Infusions: . 0.9 % NaCl with KCl 40 mEq / L 100 mL/hr (01/25/17 0725)  . heparin 1,100 Units/hr (01/24/17 2044)  . piperacillin-tazobactam (ZOSYN)  IV Stopped (01/25/17 0305)   PRN Meds: acetaminophen **OR** acetaminophen, albuterol, meclizine, ondansetron **OR** ondansetron (ZOFRAN) IV   Vital Signs    Vitals:   01/24/17 0623 01/24/17 1208 01/24/17 1939 01/25/17 0542  BP: (!) 153/70 (!) 114/46 (!) 112/51 107/72  Pulse: (!) 103 94 86 86  Resp: 18  19 19   Temp: 97.8 F (36.6 C) 98 F (36.7 C) 97.8 F (36.6 C) 98.3 F (36.8 C)  TempSrc: Oral Oral  Oral  SpO2: 98% 96% 93% 95%  Weight:    191 lb 14.4 oz (87 kg)  Height:        Intake/Output Summary (Last 24 hours) at 01/25/17 0845 Last data filed at 01/25/17 0700  Gross per 24 hour  Intake          2384.92 ml  Output             1000 ml  Net          1384.92 ml   Filed Weights   01/24/17 0130 01/25/17 0542  Weight: 192 lb (87.1 kg) 191 lb 14.4 oz (87 kg)    Physical Exam    GEN: Well nourished, well developed, in no acute distress.  HEENT: Grossly normal.  Neck: Supple, JVP ~ 12 cm, no carotid bruits, or masses. Cardiac: RRR, no murmurs, rubs, or gallops. No clubbing, cyanosis, trace bilat LE edema - tender lower legs.  Radials/DP/PT 1+ and equal bilaterally.  Respiratory:  Respirations regular and unlabored, bibasilar crackles. GI: Soft, diffusely tender, nondistended, BS + x 4. MS: no deformity or atrophy. Skin: warm and dry, no rash. Neuro:  Strength and sensation are intact. Psych: AAOx3.  Normal affect.  Labs    Chemistry  Recent Labs Lab 01/24/17 0156  NA 138  K 3.4*  CL 99*  CO2 29  GLUCOSE 155*  BUN 13  CREATININE 0.97  CALCIUM 8.8*  PROT 9.4*  ALBUMIN 3.6  AST 26  ALT 13*  ALKPHOS 51  BILITOT 1.1  GFRNONAA 54*  GFRAA >60  ANIONGAP 10     Hematology  Recent Labs Lab 01/24/17 0156 01/25/17 0606  WBC 6.1 3.4*  RBC 3.89 3.23*  HGB 12.4 10.2*  HCT 37.3  30.5*  MCV 95.7 94.2  MCH 31.8 31.6  MCHC 33.2 33.5  RDW 16.2* 16.5*  PLT 160 128*    Cardiac Enzymes  Recent Labs Lab 01/24/17 0156 01/24/17 0739 01/24/17 1326 01/24/17 1957  TROPONINI 0.05* 0.04* 0.05* 0.05*     Radiology    Ct Angio Abd/pel W And/or Wo Contrast  Result Date: 01/24/2017 CLINICAL DATA:  Generalized abdominal pain starting today. Vomiting. Loose bowel movements. History of ventricular a rib anemia. EXAM: CTA ABDOMEN AND PELVIS wITHOUT AND WITH CONTRAST TECHNIQUE: Multidetector CT imaging of the abdomen and pelvis was performed using the standard protocol during bolus administration of intravenous contrast. Multiplanar reconstructed images and MIPs were obtained and reviewed to evaluate the vascular anatomy. CONTRAST:  100 mL Isovue 370 COMPARISON:  11/22/2016 FINDINGS: VASCULAR Aorta: Normal caliber aorta without aneurysm, dissection, vasculitis or significant stenosis. Diffuse aortic calcification. Celiac: Calcification at the origin of the  celiac axis but the celiac axis and branch vessels are patent without evidence of significant stenosis. SMA: Calcification at the origin of the superior mesenteric artery with scattered calcification throughout. There is focal narrowing at the origin of the superior mesenteric artery. This represents 50% or greater diameter reduction. The vessel remains patent through this area of stenosis. There is a focal filling defect in 1 of the second or third order right mesenteric branch vessels resulting and 80-90% diameter reduction focally. This could represent thrombus or atherosclerotic plaque. The vessel remains patent distal to this area. Renals: Single bilateral renal arteries are patent with calcification at the origins. IMA: Inferior mesenteric artery is patent. Inflow: Patent without evidence of aneurysm, dissection, vasculitis or significant stenosis. Proximal Outflow: Bilateral common femoral and visualized portions of the superficial and profunda femoral arteries are patent without evidence of aneurysm, dissection, vasculitis or significant stenosis. Veins: No obvious venous abnormality within the limitations of this arterial phase study. Review of the MIP images confirms the above findings. NON-VASCULAR Lower chest: Small bilateral pleural effusions. Mosaic attenuation pattern in the lung bases suggesting edema or diffuse pneumonia. Cardiac enlargement. Pectus excavatum. Hepatobiliary: The gallbladder is distended. There is suggestion of small stones in the dependent gallbladder. No bile duct dilatation. No focal liver lesions. Pancreas: Unremarkable. No pancreatic ductal dilatation or surrounding inflammatory changes. Spleen: Calcified granulomas in the spleen.  No splenomegaly. Adrenals/Urinary Tract: No adrenal gland nodules. Multiple intrarenal stones bilaterally. Largest is in the right upper pole and measures about 5 mm in diameter. Bilateral renal cysts are incompletely characterized. No change since  prior study. Low-attenuation zone in the upper pole right kidney is again demonstrated and incompletely characterized. This could be a focal defect due to infarct, infection, or mass lesion. As previously recommended on ultrasound 12/01/2016, MRI would be suggested in the elective setting for further evaluation. No hydronephrosis or hydroureter. The bladder wall is thickened and there is gas in the bladder. This suggests cystitis. There is a stone in the base of the bladder. Stomach/Bowel: Diffuse free intraperitoneal and a small amount of retroperitoneal air. Stomach is incompletely distended but there is evidence of gastric wall thickening. There is gas adjacent to the duodenum. This suggests the probability of a perforated duodenal ulcer. Small bowel and colon are not abnormally distended and no discrete areas of wall thickening or pneumatosis are demonstrated. Appendix is normal. Diverticulosis of the sigmoid colon without evidence of diverticulitis. Lymphatic: Aortic atherosclerosis. No enlarged abdominal or pelvic lymph nodes. Reproductive: Status post hysterectomy. No adnexal masses. Other: Surgical sutures in the anterior abdominal  wall. Musculoskeletal: Degenerative changes diffusely throughout the lumbar spine. Severe degenerative changes in the left hip. No destructive bone lesions. IMPRESSION: VASCULAR 1. Diffuse aortic and vascular calcifications. 2. Stenosis at the origin of the celiac axis and superior mesenteric artery due to calcific stenosis. 3. Focal filling defect in a mid mesenteric SMA branch may represent thrombus or atherosclerotic plaque formation. NON-VASCULAR 1. Free intraperitoneal air and a small amount of retroperitoneal air. Suggestion of gastric wall thickening with tear adjacent to the duodenum. High suspicion for perforated duodenal ulcer. No definite findings to suggest bowel ischemia. 2. Small bilateral pleural effusions. Airspace disease in the lungs probably represents edema. 3.  Distended gallbladder with small stones. 4. Multiple bilateral nonobstructing intrarenal stones. 5. Hypoenhancing zone in the upper pole right kidney is unchanged since previous study. Differential diagnosis would include infarct, infection, or mass. As recommended on prior ultrasound, elective MRI is recommended for further evaluation. 6. Bladder wall thickening with gas in the bladder suggesting cystitis unless there has been instrumentation. Small bladder stone. 7. Calcified splenic granulomas. These results were called by telephone at the time of interpretation on 01/24/2017 at 6:24 am to Dr. Hinda Kehr , who verbally acknowledged these results. Electronically Signed   By: Lucienne Capers M.D.   On: 01/24/2017 06:28    Telemetry    RSR, PVCs, 3 beats NSVT - Personally Reviewed  Cardiac Studies   10.7.2018 2D Echocardiogram   Study Conclusions   - Left ventricle: The cavity size was normal. Wall thickness was   increased in a pattern of mild LVH. Systolic function was   moderately reduced. The estimated ejection fraction was 40%.   Diffuse hypokinesis. Regional wall motion abnormalities cannot be   excluded. Doppler parameters are consistent with restrictive   physiology, indicative of decreased left ventricular diastolic   compliance and/or increased left atrial pressure. Doppler   parameters are consistent with high ventricular filling pressure. - Mitral valve: Calcified annulus. Mildly thickened leaflets .   There was mild regurgitation. - Left atrium: The atrium was mildly dilated. - Right ventricle: The cavity size was normal. Systolic function   was mildly reduced. - Pulmonary arteries: Systolic pressure was at least moderately   increased; PA systolic pressure was 53-64 mmHg plus central   venous pressure.   Patient Profile     Mia Padilla is a 82 y.o. female with a hx of paroxysmal atrial fibrillation not on anticoagulation, chronic diastolic heart failure,  hypertension, diabetes mellitus, breast cancer, carotid artery stenosis, and COPD, who was admitted 10/6 w/ abdominal pain and finding of perforated duodenal ulcer (conservative Rx - abx/ppi), along with mild to moderate superior mesenteric artery stenosis  not felt to be causing mesenteric ischemia (seen by vasc surgery).  Assessment & Plan    1. Perforated duodenal ulcer: Seen by surgery.  Conservative mgmt with PPI, abx.  Abd diffusely tender this am but overall much improved.   2.  Elevated troponin: No prior h/o CAD.  In setting of above, pt noted to have mild trop elevation with flat trend (0.05  0.04  0.05  0.05).  Echo performed 10/7  EF 40% w/ diff HK (prev reported nl EF in 2012).  Pt w/o prior h/o chest pain but has had DOE recently.  Will plan on ischemic evaluation, potentially as outpt, once she has recovered from current illness/#1.  Cont  blocker.  LDL 68 in 11/2016.  Was on plavix in setting of PAF - currently on hold in case  she requires surgical intervention.  Will need to decide on resumption of plavix vs initiation of DOAC in setting of PAF.  Heparin for now.  3.  Acute on chronic diastolic CHF: Echo 77/8 w/ EF 40% and evidence of high filling pressures (PASP 50-55 mmHg). Wt variable over time.  If accurate, up ~ 5 lbs since August.  She has had more dyspnea @ home.  Lasix held in setting of perforated ulcer. I/O + 1.9L.  Short of breath last night.  Crackles on exam w/ JVD.  Will add lasix 20 IV bid.  Slow IVF.  4.  PAF: Prev on coumadin but stopped 2/2 unsteady gait in 09/2011.  Has been on plavix since.  Plavix currently on hold in setting of #1.  In sinus on tele.  She says that she occasionally notes palpitations @ home, lasting up to 15 mins.  No prolonged episodes.  She has never fallen.  CHA2DS2VASc = @ least 6.  Will need to consider DOAC going forward.  Will hold off until it is clear that invasive eval not needed.  Cont  blocker.  5.  Mesenteric artery stenosis: Seen by  vascular surgery.  Felt to have mild/mod SMA stenosis w/ small filling defect but w/o mesenteric ischemia.  Not felt to be cause of abd pain.  No plan for surgical intervention @ this time.  In setting of PAF, as above, will need to consider DOAC.  Heparin for now.  6.  Essential HTN: stable on  blocker.  7.  Hypokalemia/Hypomagnesemia: K 3.4,  Mg 1.2 10/7.  Repeat pending this AM.  Currently receiving K in IVF - need to reduce rate in setting of #3.  8.  Normocytic anemia:  H/H down since admission  12.4/37.3  10.2/30.5.  Follow on heparin and before switching to oral anticoagulant.   Signed, Murray Hodgkins, NP  01/25/2017, 8:45 AM    For questions or updates, please contact   Please consult [email or web address] for contact info under Cardiology/STEMI.

## 2017-01-26 ENCOUNTER — Ambulatory Visit: Payer: Medicare Other

## 2017-01-26 ENCOUNTER — Inpatient Hospital Stay: Payer: Medicare Other

## 2017-01-26 LAB — BASIC METABOLIC PANEL
ANION GAP: 7 (ref 5–15)
BUN: 14 mg/dL (ref 6–20)
CHLORIDE: 105 mmol/L (ref 101–111)
CO2: 26 mmol/L (ref 22–32)
Calcium: 7.7 mg/dL — ABNORMAL LOW (ref 8.9–10.3)
Creatinine, Ser: 0.96 mg/dL (ref 0.44–1.00)
GFR, EST NON AFRICAN AMERICAN: 54 mL/min — AB (ref >60)
Glucose, Bld: 89 mg/dL (ref 65–99)
POTASSIUM: 3.6 mmol/L (ref 3.5–5.1)
SODIUM: 138 mmol/L (ref 135–145)

## 2017-01-26 LAB — CBC
HEMATOCRIT: 29.1 % — AB (ref 35.0–47.0)
HEMOGLOBIN: 9.6 g/dL — AB (ref 12.0–16.0)
MCH: 31.8 pg (ref 26.0–34.0)
MCHC: 33.1 g/dL (ref 32.0–36.0)
MCV: 96.2 fL (ref 80.0–100.0)
Platelets: 130 10*3/uL — ABNORMAL LOW (ref 150–440)
RBC: 3.03 MIL/uL — ABNORMAL LOW (ref 3.80–5.20)
RDW: 16.2 % — ABNORMAL HIGH (ref 11.5–14.5)
WBC: 2.7 10*3/uL — AB (ref 3.6–11.0)

## 2017-01-26 LAB — GLUCOSE, CAPILLARY
Glucose-Capillary: 80 mg/dL (ref 65–99)
Glucose-Capillary: 86 mg/dL (ref 65–99)
Glucose-Capillary: 82 mg/dL (ref 65–99)
Glucose-Capillary: 80 mg/dL (ref 65–99)

## 2017-01-26 LAB — HEPARIN LEVEL (UNFRACTIONATED): HEPARIN UNFRACTIONATED: 0.49 [IU]/mL (ref 0.30–0.70)

## 2017-01-26 LAB — MAGNESIUM: MAGNESIUM: 1.6 mg/dL — AB (ref 1.7–2.4)

## 2017-01-26 MED ORDER — POTASSIUM CHLORIDE CRYS ER 20 MEQ PO TBCR
20.0000 meq | EXTENDED_RELEASE_TABLET | Freq: Every day | ORAL | Status: DC
  Administered 2017-01-26 – 2017-01-31 (×6): 20 meq via ORAL
  Filled 2017-01-26 (×6): qty 1

## 2017-01-26 MED ORDER — LOSARTAN POTASSIUM 25 MG PO TABS
12.5000 mg | ORAL_TABLET | Freq: Every day | ORAL | Status: DC
  Administered 2017-01-26 – 2017-01-27 (×2): 12.5 mg via ORAL
  Filled 2017-01-26 (×2): qty 1

## 2017-01-26 MED ORDER — IOPAMIDOL (ISOVUE-300) INJECTION 61%
150.0000 mL | Freq: Once | INTRAVENOUS | Status: AC | PRN
  Administered 2017-01-26: 150 mL via ORAL

## 2017-01-26 MED ORDER — MAGNESIUM SULFATE 2 GM/50ML IV SOLN
2.0000 g | Freq: Once | INTRAVENOUS | Status: AC
  Administered 2017-01-26: 2 g via INTRAVENOUS
  Filled 2017-01-26: qty 50

## 2017-01-26 MED ORDER — ENOXAPARIN SODIUM 40 MG/0.4ML ~~LOC~~ SOLN: 40.0000 mg | SUBCUTANEOUS | Status: DC

## 2017-01-26 NOTE — Progress Notes (Signed)
Progress Note  Patient Name: Mia Padilla Date of Encounter: 01/26/2017  Primary Cardiologist: Johnny Bridge, MD   Subjective   Abdomen feeling much better. No chest pain or dyspnea. Responding well to Lasix.  Inpatient Medications    Scheduled Meds: . anastrozole  1 mg Oral Daily  . atorvastatin  20 mg Oral q1800  . brimonidine  1 drop Both Eyes BID  . cyanocobalamin  1,000 mcg Intramuscular Q30 days  . docusate sodium  100 mg Oral BID  . dorzolamide  1 drop Both Eyes BID  . fluticasone  1 spray Each Nare Daily  . furosemide  20 mg Intravenous BID  . insulin aspart  0-9 Units Subcutaneous TID WC  . latanoprost  1 drop Both Eyes QHS  . levothyroxine  100 mcg Oral QAC breakfast  . metoprolol succinate  100 mg Oral Daily  . mometasone-formoterol  2 puff Inhalation BID  . mupirocin ointment  1 application Topical BID  . nystatin   Topical TID  . olopatadine  1 drop Both Eyes BID  . pantoprazole (PROTONIX) IV  40 mg Intravenous Q12H   Continuous Infusions: . 0.9 % NaCl with KCl 40 mEq / L 50 mL/hr (01/26/17 0226)  . heparin 1,100 Units/hr (01/26/17 1117)  . piperacillin-tazobactam (ZOSYN)  IV 3.375 g (01/26/17 0821)   PRN Meds: acetaminophen **OR** acetaminophen, albuterol, dextrose, meclizine, ondansetron **OR** ondansetron (ZOFRAN) IV   Vital Signs    Vitals:   01/25/17 1234 01/25/17 2007 01/26/17 0336 01/26/17 0759  BP: 133/64 (!) 115/57 (!) 127/59 (!) 141/69  Pulse: 85 78 75 81  Resp:  17 17 16   Temp: 97.9 F (36.6 C) (!) 97.5 F (36.4 C) 98 F (36.7 C) 98.6 F (37 C)  TempSrc: Oral Oral Oral Oral  SpO2: 100% 97% 97% 97%  Weight:   186 lb 6.4 oz (84.6 kg)   Height:        Intake/Output Summary (Last 24 hours) at 01/26/17 1137 Last data filed at 01/26/17 0700  Gross per 24 hour  Intake              832 ml  Output              700 ml  Net              132 ml   Filed Weights   01/24/17 0130 01/25/17 0542 01/26/17 0336  Weight: 192 lb (87.1 kg) 191 lb  14.4 oz (87 kg) 186 lb 6.4 oz (84.6 kg)    Physical Exam   GEN: Well nourished, well developed, in no acute distress.  HEENT: Grossly normal.  Neck: Supple, no JVD, carotid bruits, or masses. Cardiac: RRR, no murmurs, rubs, or gallops. No clubbing, cyanosis, edema.  Radials/DP/PT 2+ and equal bilaterally. Lower legs are tender. Respiratory:  Respirations regular and unlabored, bibasilar crackles. GI: Soft, nontender, nondistended, BS + x 4. MS: no deformity or atrophy. Skin: warm and dry, no rash. Neuro:  Strength and sensation are intact. Psych: AAOx3.  Normal affect.  Labs    Chemistry Recent Labs Lab 01/24/17 0156 01/25/17 0606 01/26/17 0332  NA 138 138 138  K 3.4* 3.6 3.6  CL 99* 104 105  CO2 29 26 26   GLUCOSE 155* 99 89  BUN 13 11 14   CREATININE 0.97 0.97 0.96  CALCIUM 8.8* 8.1* 7.7*  PROT 9.4*  --   --   ALBUMIN 3.6  --   --   AST 26  --   --  ALT 13*  --   --   ALKPHOS 51  --   --   BILITOT 1.1  --   --   GFRNONAA 54* 54* 54*  GFRAA >60 >60 >60  ANIONGAP 10 8 7      Hematology Recent Labs Lab 01/24/17 0156 01/25/17 0606 01/26/17 0332  WBC 6.1 3.4* 2.7*  RBC 3.89 3.23* 3.03*  HGB 12.4 10.2* 9.6*  HCT 37.3 30.5* 29.1*  MCV 95.7 94.2 96.2  MCH 31.8 31.6 31.8  MCHC 33.2 33.5 33.1  RDW 16.2* 16.5* 16.2*  PLT 160 128* 130*    Cardiac Enzymes Recent Labs Lab 01/24/17 0156 01/24/17 0739 01/24/17 1326 01/24/17 1957  TROPONINI 0.05* 0.04* 0.05* 0.05*     Radiology    Dg Ugi  W/kub  Result Date: 01/26/2017 CLINICAL DATA:  Clinical suspicion of duodenal ulcer perforation recently. Clinically improved. EXAM: UPPER GI SERIES WITH KUB TECHNIQUE: After obtaining a scout radiograph a routine upper GI series was performed using thick and thin barium. Effervescent crystals were not administered. FLUOROSCOPY TIME:  Fluoroscopy Time:  2 minutes Radiation Exposure Index (if provided by the fluoroscopic device): 4992 micro Gy per meters square Number of  Acquired Spot Images: 7 COMPARISON:  Abdominopelvic CT scan of January 24, 2017 FINDINGS: The patient was nauseous at the outset of the procedure. The patient ingested the Isovue-300 through a straw. Approximately 125 cc were administered. The thoracic esophagus was normal in appearance. The stomach distended reasonably well given the limited volume of contrast administered. Gastric emptying however was very delayed and never completely achieved. A tiny amount of contrast appeared to be attempting to exit the stomach into the duodenal bulb. Gastric peristalsis was normal. However, over the course of approximately 10 minutes the contrast was never able to traverse the presumably edematous duodenal bulb. IMPRESSION: Near total obstruction of gastric emptying presumably due to an edematous duodenal bulb. No anatomic information of the duodenum is available. Abdominal CT scanning may be the most useful next imaging step. Electronically Signed   By: David  Martinique M.D.   On: 01/26/2017 11:10    Telemetry    Regular sinus rhythm, PVCs - Personally Reviewed  Cardiac Studies    10.7.2018 2D Echocardiogram   Study Conclusions  - Left ventricle: The cavity size was normal. Wall thickness was increased in a pattern of mild LVH. Systolic function was moderately reduced. The estimated ejection fraction was 40%. Diffuse hypokinesis. Regional wall motion abnormalities cannot be excluded. Doppler parameters are consistent with restrictive physiology, indicative of decreased left ventricular diastolic compliance and/or increased left atrial pressure. Doppler parameters are consistent with high ventricular filling pressure. - Mitral valve: Calcified annulus. Mildly thickened leaflets . There was mild regurgitation. - Left atrium: The atrium was mildly dilated. - Right ventricle: The cavity size was normal. Systolic function was mildly reduced. - Pulmonary arteries: Systolic pressure was  at least moderately increased; PA systolic pressure was 96-29 mmHg plus central venous pressure.  Patient Profile     Mia Padilla a 82 y.o.femalewith a hx of paroxysmal atrial fibrillation not on anticoagulation, chronic diastolic heart failure, hypertension, diabetes mellitus, breast cancer, carotid artery stenosis, and COPD,who was admitted 10/6 w/ abdominal pain and finding of perforated duodenal ulcer (conservative Rx - abx/ppi), along with mild to moderate superior mesenteric artery stenosis  not felt to be causing mesenteric ischemia (seen by vasc surgery).  Assessment & Plan    1. Perforated duodenal ulcer: Conservative management with PPI and antibiotics. Follow-up upper  GI series today. Defer to surgery.  2. Elevated troponin: No prior history of CAD. In the setting of above, patient noted to have mild troponin elevation with flexion. Echo on October 7 showed moderate LV dysfunction with an EF of 40% and diffuse hypokinesis. This was previously reported as being normal in 2012. She has had dyspnea on exertion and also reports intermittent chest discomfort at home. We'll plan on ischemic evaluation as outpatient when she has recovered from current illness. Continue beta blocker. We will plan to discontinue Plavix in the setting of initiation of oral anticoagulation for paroxysmal atrial fibrillation and will DC heparin at that time. Awaiting follow-up from surgery related to upper GI series and whether or not patient needs any further invasive evaluation.  3. Acute on chronic diastolic congestive heart failure: EF 10/7 with EF 40% and evidence of high filling pressures (CPA SP 50-55 mmHg). We did add IV Lasix yesterday and backed off her IV fluids. If accurate, she was only -168, the weight is down 5 pounds. She has not had any recurrence of dyspnea. Still with crackles on exam. I'm discontinuing IV fluids. Continue IV Lasix today. Probably switch to by mouth tomorrow. Renal function  stable. Continue beta blocker. Add low-dose losartan.  4. Paroxysmal atrial fibrillation: Previous on Coumadin but stopped secondary to unsteady gait in June 2013. She's been on Plavix since. She has been maintaining sinus rhythm on telemetry. She has never fallen. CHA2DS2VASc nightly 6. H&H has been drifting down but relatively stable over the past 2 days. Plan to discontinue heparin and initiate oral anticoagulation therapy with eliquis once it is clear that she will not require any invasive evaluation.  5. Essential hypertension: Stable on beta blocker.  7. Hypokalemia/hypomagnesemia: Potassium at the low end of normal. Magnesium is low at 1.6. Supplement.  8. Normocytic anemia: H&H continues to drift some. Relatively stable since yesterday. Follow on oral anticoagulation.  Signed, Murray Hodgkins, NP  01/26/2017, 11:37 AM    For questions or updates, please contact   Please consult [email or web address] for contact info under Cardiology/STEMI.

## 2017-01-26 NOTE — Progress Notes (Signed)
SURGICAL PROGRESS NOTE (cpt [id number])  Hospital Day(s): 2.   Post op day(s):  Mia Padilla   Interval History: Patient seen and examined, no acute events or new complaints overnight. Patient reports she'd like to eat, denies abdominal pain, N/V, fever/chills, CP, or SOB.  Review of Systems:  Constitutional: denies fever, chills  HEENT: denies cough or congestion  Respiratory: denies any shortness of breath  Cardiovascular: denies chest pain or palpitations  Gastrointestinal: abdominal pain, N/V, and bowel function as per interval history Genitourinary: denies burning with urination or urinary frequency Musculoskeletal: denies pain, decreased motor or sensation Integumentary: denies any other rashes or skin discolorations Neurological: denies HA or vision/hearing changes   Vital signs in last 24 hours: [min-max] current  Temp:  [97.5 F (36.4 C)-98.6 F (37 C)] 98.6 F (37 C) (10/09 0759) Pulse Rate:  [75-92] 81 (10/09 0759) Resp:  [16-17] 16 (10/09 0759) BP: (115-141)/(57-71) 141/69 (10/09 0759) SpO2:  [94 %-100 %] 97 % (10/09 0759) Weight:  [186 lb 6.4 oz (84.6 kg)] 186 lb 6.4 oz (84.6 kg) (10/09 0336)     Height: 5\' 7"  (170.2 cm) Weight: 186 lb 6.4 oz (84.6 kg) BMI (Calculated): 29.19   Intake/Output this shift:  No intake/output data recorded.   Intake/Output last 2 shifts:  @IOLAST2SHIFTS @   Physical Exam:  Constitutional: alert, cooperative and no distress  HENT: normocephalic without obvious abnormality  Eyes: PERRL, EOM's grossly intact and symmetric  Neuro: CN II - XII grossly intact and symmetric without deficit  Respiratory: breathing non-labored at rest  Cardiovascular: regular rate and sinus rhythm  Gastrointestinal: soft, non-tender, and non-distended Musculoskeletal: UE and LE FROM, no edema or wounds, motor and sensation grossly intact, NT   Labs:  CBC Latest Ref Rng & Units 01/26/2017 01/25/2017 01/24/2017  WBC 3.6 - 11.0 K/uL 2.7(L) 3.4(L) 6.1  Hemoglobin 12.0 -  16.0 g/dL 9.6(L) 10.2(L) 12.4  Hematocrit 35.0 - 47.0 % 29.1(L) 30.5(L) 37.3  Platelets 150 - 440 K/uL 130(L) 128(L) 160   CMP Latest Ref Rng & Units 01/26/2017 01/25/2017 01/24/2017  Glucose 65 - 99 mg/dL 89 99 155(H)  BUN 6 - 20 mg/dL 14 11 13   Creatinine 0.44 - 1.00 mg/dL 0.96 0.97 0.97  Sodium 135 - 145 mmol/L 138 138 138  Potassium 3.5 - 5.1 mmol/L 3.6 3.6 3.4(L)  Chloride 101 - 111 mmol/L 105 104 99(L)  CO2 22 - 32 mmol/L 26 26 29   Calcium 8.9 - 10.3 mg/dL 7.7(L) 8.1(L) 8.8(L)  Total Protein 6.5 - 8.1 g/dL - - 9.4(H)  Total Bilirubin 0.3 - 1.2 mg/dL - - 1.1  Alkaline Phos 38 - 126 U/L - - 51  AST 15 - 41 U/L - - 26  ALT 14 - 54 U/L - - 13(L)   Imaging studies:  UGI with KUB (01/26/2017) Near total obstruction of gastric emptying presumably due to an edematous duodenal bulb. No anatomic information of the duodenum.  Assessment/Plan: (ICD-10's: K26.1) 82 y.o. female with near-obstruction of duodenal bulb secondary to duodenal inflammation/edema s/p perforated duodenal ulcer with resolution of symptoms and without contrast extravasation on UGI with KUB today, being managed non-operatively due to elevated Troponin initially concerning for NSTEMI, along with therapeutic heparin infusion and Plavix for SMA embolus attributed to atrial fibrillation no longer on anticoagulation (at time of adission) due to patient fall risk, CHF with LV dysfunction, and complicated also by pertinent comorbidities including obesity (BMI >30), DM, HTN, HLD, CAD with atrial fibrillation, CHF with LV dysfunction, COPD, hypothyroidism, seizure  disorder, history of breast cancer on Arimidex, advanced age, and bowel + urinary incontinence.              - NPO for now, IVF             - PPI, antibiotics, and therapeutic anticoagulation             - follow-up KUB tomorrow KUB to assess for slow passage of enteric contrast             - continue non-operative management considering asymptomatic with therapeutic  anticoagulation and Plavix for SMA embolus, elevated troponin, and comorbidities             - medical management of comorbidities per medical team  - may need to consider TPN if prolonged NPO status             - outpatient cardiac stress testing as per cardiology             - monitor abdominal exam and bowel function  All of the above findings and recommendations were discussed with the patient and her family, and all of patient's and her family's questions were answered to their expressed satisfaction.  Thank you for the opportunity to participate in this patient's care.  -- Marilynne Drivers Rosana Hoes, MD, [place]: [facility] - Partnering for exceptional care. Office: [phone]

## 2017-01-26 NOTE — Progress Notes (Signed)
[facility] NAME: Mia Padilla    MR#:  [id number]  DATE OF BIRTH:  1934/11/07  SUBJECTIVE:   Patient continues to deny abdominal pain or chest pain. No other acute complaints or events overnight. An upper GI series this morning but results are nondiagnostic and await further surgical input.  REVIEW OF SYSTEMS:    Review of Systems  Constitutional: Negative for chills and fever.  HENT: Negative for congestion and tinnitus.   Eyes: Negative for blurred vision and double vision.  Respiratory: Negative for cough, shortness of breath and wheezing.   Cardiovascular: Negative for chest pain, orthopnea and PND.  Gastrointestinal: Negative for abdominal pain, diarrhea, nausea and vomiting.  Genitourinary: Negative for dysuria and hematuria.  Neurological: Negative for dizziness, sensory change and focal weakness.  All other systems reviewed and are negative.   Nutrition: NPO Tolerating Diet: No Tolerating PT: Await Eval.      DRUG ALLERGIES:   Allergies  Allergen Reactions  . Propoxyphene Other (See Comments)    Other Reaction: Severe Headache  . Advil [Ibuprofen] Other (See Comments)    Makes her heart race  . Darvocet [Propoxyphene N-Acetaminophen]   . Percocet [Oxycodone-Acetaminophen]     hallucination    VITALS:  Blood pressure 134/80, pulse 91, temperature 97.6 F (36.4 C), temperature source Oral, resp. rate 18, height 5\' 7"  (1.702 m), weight 84.6 kg (186 lb 6.4 oz), SpO2 93 %.  PHYSICAL EXAMINATION:   Physical Exam  GENERAL:  82 y.o.-year-old patient lying in bed in no acute distress.  EYES: Pupils equal, round, reactive to light and accommodation. No scleral icterus. Extraocular muscles intact.  HEENT: Head atraumatic, normocephalic. Oropharynx and nasopharynx clear.  NECK:  Supple, no jugular venous distention. No thyroid enlargement, no tenderness.  LUNGS: Normal breath sounds bilaterally, no wheezing, rales,  rhonchi. No use of accessory muscles of respiration.  CARDIOVASCULAR: S1, S2 normal. No murmurs, rubs, or gallops.  ABDOMEN: Soft, nontender, nondistended. Bowel sounds present. No organomegaly or mass.  EXTREMITIES: No cyanosis, clubbing or edema b/l.    NEUROLOGIC: Cranial nerves II through XII are intact. No focal Motor or sensory deficits b/l.   PSYCHIATRIC: The patient is alert and oriented x 3.  SKIN: No obvious rash, lesion, or ulcer.    LABORATORY PANEL:   CBC  Recent Labs Lab 01/26/17 0332  WBC 2.7*  HGB 9.6*  HCT 29.1*  PLT 130*   ------------------------------------------------------------------------------------------------------------------  Chemistries   Recent Labs Lab 01/24/17 0156  01/26/17 0332  NA 138  < > 138  K 3.4*  < > 3.6  CL 99*  < > 105  CO2 29  < > 26  GLUCOSE 155*  < > 89  BUN 13  < > 14  CREATININE 0.97  < > 0.96  CALCIUM 8.8*  < > 7.7*  MG 1.2*  < > 1.6*  AST 26  --   --   ALT 13*  --   --   ALKPHOS 51  --   --   BILITOT 1.1  --   --   < > = values in this interval not displayed. ------------------------------------------------------------------------------------------------------------------  Cardiac Enzymes  Recent Labs Lab 01/24/17 1957  TROPONINI 0.05*   ------------------------------------------------------------------------------------------------------------------  RADIOLOGY:  Dg Ugi  W/kub  Result Date: 01/26/2017 CLINICAL DATA:  Clinical suspicion of duodenal ulcer perforation recently. Clinically improved. EXAM: UPPER GI SERIES WITH KUB TECHNIQUE: After obtaining a scout radiograph a routine upper GI  series was performed using thick and thin barium. Effervescent crystals were not administered. FLUOROSCOPY TIME:  Fluoroscopy Time:  2 minutes Radiation Exposure Index (if provided by the fluoroscopic device): 4992 micro Gy per meters square Number of Acquired Spot Images: 7 COMPARISON:  Abdominopelvic CT scan of January 24, 2017 FINDINGS: The patient was nauseous at the outset of the procedure. The patient ingested the Isovue-300 through a straw. Approximately 125 cc were administered. The thoracic esophagus was normal in appearance. The stomach distended reasonably well given the limited volume of contrast administered. Gastric emptying however was very delayed and never completely achieved. A tiny amount of contrast appeared to be attempting to exit the stomach into the duodenal bulb. Gastric peristalsis was normal. However, over the course of approximately 10 minutes the contrast was never able to traverse the presumably edematous duodenal bulb. IMPRESSION: Near total obstruction of gastric emptying presumably due to an edematous duodenal bulb. No anatomic information of the duodenum is available. Abdominal CT scanning may be the most useful next imaging step. Electronically Signed   By: David  Martinique M.D.   On: 01/26/2017 11:10     ASSESSMENT AND PLAN:   82 year old female with past medical history of hypertension, hyperlipidemia, diabetes, COPD, coronary artery stenosis with history of breast cancer presented to the hospital due to abdominal pain and noted to have a perforated viscus on CT scan. Patient was also noted to have a mildly elevated troponin.  1. Abdominal pain-secondary to a perforated viscus is seen on CT scan of the abdomen and pelvis. Patient Remains clinically asymptomatic. -Had an upper GI series this morning which was nondiagnostic which showed a near total obstruction of the gastric emptying. Due to edematous duodenal bulb.  Await further input from surgery regarding further studies. A KUB still pending. Patient may need a repeat CT scan of abdomen pelvis. Continue supportive care with IV antibiotics fluids and pain control for now.  2. Elevated troponin-secondary to supply demand ischemia, no evidence of acute coronary syndrome. -Troponins did not trend upwards.  -Patient's echocardiogram did show  LV dysfunction and cardiology recommends outpatient stress test for risk stratification. - cont. Heparin gtt for now.  Cont. Toprol, Losartan, Atorvastatin for now.   3. 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 A vascular surgery consult obtained. They recommended continuing heparin, Plavix. No plans for angiogram or any other further intervention. Patient is clinically asymptomatic.  4. Hypothyroidism-continue Synthroid.  5. Diabetes type 2 without compensation-continue Levemir, sliding scale insulin.  6. Glaucoma-continue dorzolamide, latanoprost eyedrops.  7. COPD-no acute exacerbation. Continue Dulera  8. Hx of Breast Cancer - cont. Arimidex.   9. Hx of paroxysmal atrial fibrillation-rate controlled. Continue metoprolol.  Cont. Heparin gtt - plan for starting Eliquis once it's clear that pt. Does not require any surgical intervention.   10. Acute on chronic systolic CHF - cont. IV lasix, Toprol, low dose losartan started.  - appreciate Cards input.  Follow I's and O's and daily weights.   All the records are reviewed and case discussed with Care Management/Social Worker. Management plans discussed with the patient, family and they are in agreement.  CODE STATUS: Full code  DVT Prophylaxis:   TOTAL TIME TAKING CARE OF THIS PATIENT: 30 minutes.   POSSIBLE D/C IN 1-2 DAYS, DEPENDING ON CLINICAL CONDITION.   Henreitta Leber M.D on 01/26/2017 at 3:30 PM  Between 7am to 6pm - Pager - [phone]  After 6pm go to [email or web address] - password  [email or web address]  [facility]  Office  [phone]  CC: Primary care physician; [name], MD

## 2017-01-26 NOTE — Progress Notes (Signed)
[place] for heparin drip Indication: chest pain/ACS/NSTEMI  Allergies  Allergen Reactions  . Propoxyphene Other (See Comments)    Other Reaction: Severe Headache  . Advil [Ibuprofen] Other (See Comments)    Makes her heart race  . Darvocet [Propoxyphene N-Acetaminophen]   . Percocet [Oxycodone-Acetaminophen]     hallucination    Patient Measurements: Height: 5\' 7"  (170.2 cm) Weight: 191 lb 14.4 oz (87 kg) IBW/kg (Calculated) : 61.6 Heparin Dosing Weight: 80 kg  Vital Signs: Temp: 98 F (36.7 C) (10/09 0336) Temp Source: Oral (10/09 0336) BP: 127/59 (10/09 0336) Pulse Rate: 75 (10/09 0336)  Labs:  Recent Labs  01/24/17 0156 01/24/17 0739 01/24/17 1326  01/24/17 1957 01/25/17 0606 01/25/17 1345 01/26/17 0332  HGB 12.4  --   --   --   --  10.2*  --  9.6*  HCT 37.3  --   --   --   --  30.5*  --  29.1*  PLT 160  --   --   --   --  128*  --  130*  APTT 27  --   --   --   --   --   --   --   LABPROT 18.6*  --   --   --   --   --   --   --   INR 1.57  --   --   --   --   --   --   --   HEPARINUNFRC  --   --   --   < > 0.29* 0.41 0.49 0.49  CREATININE 0.97  --   --   --   --  0.97  --  0.96  TROPONINI 0.05* 0.04* 0.05*  --  0.05*  --   --   --   < > = values in this interval not displayed.  Estimated Creatinine Clearance: 53 mL/min (by C-G formula based on SCr of 0.96 mg/dL).  Medications:  No anticoagulation in PTA meds.  Assessment: 82 yo female started on heparin drip for NSTEM was stopped, now being restarted. Possible mesenteric artery thrombus/stenosis noted on CT scan as well.  Goal of Therapy:  Heparin level 0.3-0.7 units/ml Monitor platelets by anticoagulation protocol: Yes   Plan:  Restart heparin drip at 950 units/hr (=9.5 ml/hr). First heparin level 8 hours after restart of infusion. CBC in AM  10/7 1957 HL subtherapeutic x 1. 1200 units IV x 1 bolus and increase rate to 1100 units/hr. Recheck HL in 8 hours.    10/8 0606 HL Therapeutic x 1 at 0.41. Will continue current rate of 1100units/hr and recheck HL in 8 hours.   10/8 1345 HL Therapeutic x 2 at 0.49. Will continue current rate of 1100units/hr and recheck HL with AM labs. CBC daily while on heparin per protocol.   10/9 @ 0330 HL 0.49 therapeutic. Will continue current rate and will recheck w/ am labs. Will monitor CBC w/ am labs.  Tobie Lords, PharmD, BCPS Clinical Pharmacist 01/26/2017 6:03 AM

## 2017-01-27 ENCOUNTER — Inpatient Hospital Stay: Payer: Medicare Other

## 2017-01-27 LAB — CBC
HCT: 34 % — ABNORMAL LOW (ref 35.0–47.0)
Hemoglobin: 11.1 g/dL — ABNORMAL LOW (ref 12.0–16.0)
MCH: 31.8 pg (ref 26.0–34.0)
MCHC: 32.7 g/dL (ref 32.0–36.0)
MCV: 97.3 fL (ref 80.0–100.0)
Platelets: 159 10*3/uL (ref 150–440)
RBC: 3.49 MIL/uL — ABNORMAL LOW (ref 3.80–5.20)
RDW: 16.4 % — ABNORMAL HIGH (ref 11.5–14.5)
WBC: 3.1 10*3/uL — ABNORMAL LOW (ref 3.6–11.0)

## 2017-01-27 LAB — BASIC METABOLIC PANEL
ANION GAP: 9 (ref 5–15)
BUN: 17 mg/dL (ref 6–20)
CO2: 27 mmol/L (ref 22–32)
Calcium: 8.1 mg/dL — ABNORMAL LOW (ref 8.9–10.3)
Chloride: 102 mmol/L (ref 101–111)
Creatinine, Ser: 1 mg/dL (ref 0.44–1.00)
GFR, EST NON AFRICAN AMERICAN: 52 mL/min — AB (ref >60)
GLUCOSE: 81 mg/dL (ref 65–99)
POTASSIUM: 3.5 mmol/L (ref 3.5–5.1)
Sodium: 138 mmol/L (ref 135–145)

## 2017-01-27 LAB — GLUCOSE, CAPILLARY
Glucose-Capillary: 66 mg/dL (ref 65–99)
Glucose-Capillary: 95 mg/dL (ref 65–99)
Glucose-Capillary: 77 mg/dL (ref 65–99)
Glucose-Capillary: 78 mg/dL (ref 65–99)
Glucose-Capillary: 76 mg/dL (ref 65–99)

## 2017-01-27 LAB — HEPARIN LEVEL (UNFRACTIONATED)
HEPARIN UNFRACTIONATED: 0.68 [IU]/mL (ref 0.30–0.70)
HEPARIN UNFRACTIONATED: 0.52 [IU]/mL (ref 0.30–0.70)

## 2017-01-27 MED ORDER — FUROSEMIDE 20 MG PO TABS
20.0000 mg | ORAL_TABLET | Freq: Every day | ORAL | Status: DC
  Administered 2017-01-28 – 2017-01-31 (×4): 20 mg via ORAL
  Filled 2017-01-27 (×4): qty 1

## 2017-01-27 MED ORDER — LOSARTAN POTASSIUM 25 MG PO TABS
25.0000 mg | ORAL_TABLET | Freq: Every day | ORAL | Status: DC
  Administered 2017-01-28 – 2017-01-31 (×4): 25 mg via ORAL
  Filled 2017-01-27 (×4): qty 1

## 2017-01-27 NOTE — Progress Notes (Signed)
Progress Note  Patient Name: Mia Padilla Date of Encounter: 01/27/2017  Primary Cardiologist: Johnny Bridge, MD   Subjective   Feels well this morning. No dyspnea or chest pain. Mild left-sided abdominal discomfort. Maintaining sinus rhythm.  Inpatient Medications    Scheduled Meds: . anastrozole  1 mg Oral Daily  . atorvastatin  20 mg Oral q1800  . brimonidine  1 drop Both Eyes BID  . cyanocobalamin  1,000 mcg Intramuscular Q30 days  . docusate sodium  100 mg Oral BID  . dorzolamide  1 drop Both Eyes BID  . fluticasone  1 spray Each Nare Daily  . furosemide  20 mg Intravenous BID  . insulin aspart  0-9 Units Subcutaneous TID WC  . latanoprost  1 drop Both Eyes QHS  . levothyroxine  100 mcg Oral QAC breakfast  . losartan  12.5 mg Oral Daily  . metoprolol succinate  100 mg Oral Daily  . mometasone-formoterol  2 puff Inhalation BID  . mupirocin ointment  1 application Topical BID  . nystatin   Topical TID  . olopatadine  1 drop Both Eyes BID  . pantoprazole (PROTONIX) IV  40 mg Intravenous Q12H  . potassium chloride  20 mEq Oral Daily   Continuous Infusions: . heparin 1,000 Units/hr (01/27/17 0726)  . piperacillin-tazobactam (ZOSYN)  IV 3.375 g (01/27/17 0803)   PRN Meds: acetaminophen **OR** acetaminophen, albuterol, dextrose, meclizine, ondansetron **OR** ondansetron (ZOFRAN) IV   Vital Signs    Vitals:   01/26/17 1212 01/26/17 2046 01/27/17 0505 01/27/17 0724  BP: 134/80 (!) 142/89 136/86 (!) 156/75  Pulse: 91 76 78 81  Resp: 18 18 18 16   Temp: 97.6 F (36.4 C) 98.3 F (36.8 C) 98.4 F (36.9 C) (!) 97.3 F (36.3 C)  TempSrc: Oral Oral Oral Oral  SpO2: 93% 95% 96% 96%  Weight:   180 lb 9.6 oz (81.9 kg)   Height:        Intake/Output Summary (Last 24 hours) at 01/27/17 1031 Last data filed at 01/27/17 0505  Gross per 24 hour  Intake              309 ml  Output             2550 ml  Net            -2241 ml   Filed Weights   01/25/17 0542 01/26/17 0336  01/27/17 0505  Weight: 191 lb 14.4 oz (87 kg) 186 lb 6.4 oz (84.6 kg) 180 lb 9.6 oz (81.9 kg)    Physical Exam   GEN: Well nourished, well developed, in no acute distress.  HEENT: Grossly normal.  Neck: Supple, no JVD, carotid bruits, or masses. Cardiac: RRR, no murmurs, rubs, or gallops. No clubbing, cyanosis, edema.  Radials/DP/PT 2+ and equal bilaterally. Lower legs remain tender. Respiratory:  Respirations regular and unlabored, few basilar crackles. GI: Soft, mild left-sided abdominal tenderness. nondistended, BS + x 4. MS: no deformity or atrophy. Skin: warm and dry, no rash. Neuro:  Strength and sensation are intact. Psych: AAOx3.  Normal affect.  Labs    Chemistry Recent Labs Lab 01/24/17 0156 01/25/17 0606 01/26/17 0332 01/27/17 0536  NA 138 138 138 138  K 3.4* 3.6 3.6 3.5  CL 99* 104 105 102  CO2 29 26 26 27   GLUCOSE 155* 99 89 81  BUN 13 11 14 17   CREATININE 0.97 0.97 0.96 1.00  CALCIUM 8.8* 8.1* 7.7* 8.1*  PROT 9.4*  --   --   --  ALBUMIN 3.6  --   --   --   AST 26  --   --   --   ALT 13*  --   --   --   ALKPHOS 51  --   --   --   BILITOT 1.1  --   --   --   GFRNONAA 54* 54* 54* 52*  GFRAA >60 >60 >60 >60  ANIONGAP 10 8 7 9      Hematology Recent Labs Lab 01/25/17 0606 01/26/17 0332 01/27/17 0536  WBC 3.4* 2.7* 3.1*  RBC 3.23* 3.03* 3.49*  HGB 10.2* 9.6* 11.1*  HCT 30.5* 29.1* 34.0*  MCV 94.2 96.2 97.3  MCH 31.6 31.8 31.8  MCHC 33.5 33.1 32.7  RDW 16.5* 16.2* 16.4*  PLT 128* 130* 159    Cardiac Enzymes Recent Labs Lab 01/24/17 0156 01/24/17 0739 01/24/17 1326 01/24/17 1957  TROPONINI 0.05* 0.04* 0.05* 0.05*     Radiology    Dg Abd 1 View  Result Date: 01/27/2017 CLINICAL DATA:  Duodenal ulcer perforation. Follow-up gastric outlet obstruction. EXAM: ABDOMEN - 1 VIEW COMPARISON:  01/26/2017 and CT from 01/24/2017 FINDINGS: The oral contrast has advanced beyond the stomach and most of the contrast is within the colon including the  rectum. Again noted is gas-filled colon in the right upper abdomen. There may be residual free air in the right upper abdomen as well. Hazy densities at the left lung base. Severe joint space narrowing and degenerative changes at the left hip joint. There appears to be contrast within the appendix. IMPRESSION: Oral contrast has moved through the stomach and most of the contrast is now in the colon. No evidence for bowel obstruction. Gas in the right upper abdomen likely representing a combination of a gas-filled colon and residual free air. Limited evaluation for free air on these portable supine images. Electronically Signed   By: Markus Daft M.D.   On: 01/27/2017 08:18   Dg Ugi  W/kub  Result Date: 01/26/2017 CLINICAL DATA:  Clinical suspicion of duodenal ulcer perforation recently. Clinically improved. EXAM: UPPER GI SERIES WITH KUB TECHNIQUE: After obtaining a scout radiograph a routine upper GI series was performed using thick and thin barium. Effervescent crystals were not administered. FLUOROSCOPY TIME:  Fluoroscopy Time:  2 minutes Radiation Exposure Index (if provided by the fluoroscopic device): 4992 micro Gy per meters square Number of Acquired Spot Images: 7 COMPARISON:  Abdominopelvic CT scan of January 24, 2017 FINDINGS: The patient was nauseous at the outset of the procedure. The patient ingested the Isovue-300 through a straw. Approximately 125 cc were administered. The thoracic esophagus was normal in appearance. The stomach distended reasonably well given the limited volume of contrast administered. Gastric emptying however was very delayed and never completely achieved. A tiny amount of contrast appeared to be attempting to exit the stomach into the duodenal bulb. Gastric peristalsis was normal. However, over the course of approximately 10 minutes the contrast was never able to traverse the presumably edematous duodenal bulb. IMPRESSION: Near total obstruction of gastric emptying presumably due  to an edematous duodenal bulb. No anatomic information of the duodenum is available. Abdominal CT scanning may be the most useful next imaging step. Electronically Signed   By: David  Martinique M.D.   On: 01/26/2017 11:10    Telemetry    Sinus rhythm, PVCs - Personally Reviewed  Cardiac Studies   10.7.2018 2D Echocardiogram  Study Conclusions  - Left ventricle: The cavity size was normal. Wall thickness was  increased in a pattern of mild LVH. Systolic function was moderately reduced. The estimated ejection fraction was 40%. Diffuse hypokinesis. Regional wall motion abnormalities cannot be excluded. Doppler parameters are consistent with restrictive physiology, indicative of decreased left ventricular diastolic compliance and/or increased left atrial pressure. Doppler parameters are consistent with high ventricular filling pressure. - Mitral valve: Calcified annulus. Mildly thickened leaflets . There was mild regurgitation. - Left atrium: The atrium was mildly dilated. - Right ventricle: The cavity size was normal. Systolic function was mildly reduced. - Pulmonary arteries: Systolic pressure was at least moderately increased; PA systolic pressure was 23-55 mmHg plus central venous pressure.  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  Assessment & Plan    1. Perforated duodenal ulcer: Patient being followed by surgery. Conservative management with PPI and antibiotics. Additional imaging pending.  2. Elevated troponin: No prior  history of CAD. Troponin elevation occurred in the setting of above. Echo shows moderate LV dysfunction with EF 40% diffuse hypokinesis. EF previously reported as normal in 2012. She does have a history of dyspnea on exertion and also intermittent chest discomfort at home. Following recovery from perforated duodenal ulcer, we'll reevaluate her as an outpatient and plan on stress testing.  3. Acute on chronic combined systolic and diastolic heart failure. EF 40% with high filling pressures on echo. Volume much improved and breathing stable. -2.1 L yesterday. Weight down from 192-180 since admission. She has also been nothing by mouth this entire admission. BUN and creatinine bumping slightly. She was previously on Lasix 20 mg as needed at home. We'll convert her from IV Lasix to Lasix 20 mg by mouth daily today.  As above, plan on outpatient ischemic testing. Continue beta blocker and losartan therapy.  4. Paroxysmal atrial fibrillation: She remains in sinus rhythm with no evidence of A. fib during this admission. She notes occasional palpitations at home. As previously noted, she had been on Coumadin but this was discontinued in June 2013 due to unsteady gait. She is no history of falls. CHA2DS2VASc equals at least 6. We would recommend initiation of eliquis 5 mg twice a day once it is clear that no further invasive evaluation will occur.  5. Essential hypertension: Losartan added yesterday. I will titrate this to 25 mg daily a setting of ongoing elevated pressures.  6.  Hypokalemia/hypomagnesemia: Potassium low normal today. Continue supplementation. Follow-up magnesium in the a.m.  7. Normocytic anemia: H&H improved in the setting of diuresis. Suspect drop was dilutional.  Signed, Murray Hodgkins, NP  01/27/2017, 10:31 AM    For questions or updates, please contact   Please consult [email or web address] for contact info under Cardiology/STEMI.

## 2017-01-27 NOTE — Care Management (Signed)
Patient presented with chest pain and troponin elevation concerning for nstemi. She is being followed by surgery for near obstruction of duodenal bulb secondary to duodenal inflammation.  She has had a recent perforation of a duodenal ulcer.  She is currently npo and on IVFs.

## 2017-01-27 NOTE — Progress Notes (Signed)
[facility] [place] NAME: Mia Padilla    MR#:  [id number]  DATE OF BIRTH:  22-Apr-1934  SUBJECTIVE:   Patient continues to deny abdominal pain or chest pain. Upper GI series yesterday was nondiagnostic suggestive of a possible gastric outlet obstruction. KUB this morning showing that contrast has gone all the way through the colon. Discussed with general surgery and plan to start just sips with meds and if continues to improve continue clear liquids tomorrow. Patient is clinically asymptomatic still.  REVIEW OF SYSTEMS:    Review of Systems  Constitutional: Negative for chills and fever.  HENT: Negative for congestion and tinnitus.   Eyes: Negative for blurred vision and double vision.  Respiratory: Negative for cough, shortness of breath and wheezing.   Cardiovascular: Negative for chest pain, orthopnea and PND.  Gastrointestinal: Negative for abdominal pain, diarrhea, nausea and vomiting.  Genitourinary: Negative for dysuria and hematuria.  Neurological: Negative for dizziness, sensory change and focal weakness.  All other systems reviewed and are negative.   Nutrition: NPO except sips with meds Tolerating Diet: No Tolerating PT: Await Eval.      DRUG ALLERGIES:   Allergies  Allergen Reactions  . Propoxyphene Other (See Comments)    Other Reaction: Severe Headache  . Advil [Ibuprofen] Other (See Comments)    Makes her heart race  . Darvocet [Propoxyphene N-Acetaminophen]   . Percocet [Oxycodone-Acetaminophen]     hallucination    VITALS:  Blood pressure (!) 120/58, pulse 69, temperature (!) 97.3 F (36.3 C), temperature source Oral, resp. rate 18, height 5\' 7"  (1.702 m), weight 81.9 kg (180 lb 9.6 oz), SpO2 92 %.  PHYSICAL EXAMINATION:   Physical Exam  GENERAL:  82 y.o.-year-old patient lying in bed in no acute distress.  EYES: Pupils equal, round, reactive to light and accommodation. No scleral icterus. Extraocular  muscles intact.  HEENT: Head atraumatic, normocephalic. Oropharynx and nasopharynx clear.  NECK:  Supple, no jugular venous distention. No thyroid enlargement, no tenderness.  LUNGS: Normal breath sounds bilaterally, no wheezing, faint rales @ bases, No rhonchi. No use of accessory muscles of respiration.  CARDIOVASCULAR: S1, S2 normal. No murmurs, rubs, or gallops.  ABDOMEN: Soft, nontender, nondistended. Bowel sounds present. No organomegaly or mass.  EXTREMITIES: No cyanosis, clubbing or edema b/l.    NEUROLOGIC: Cranial nerves II through XII are intact. No focal Motor or sensory deficits b/l.   PSYCHIATRIC: The patient is alert and oriented x 3.  SKIN: No obvious rash, lesion, or ulcer.    LABORATORY PANEL:   CBC  Recent Labs Lab 01/27/17 0536  WBC 3.1*  HGB 11.1*  HCT 34.0*  PLT 159   ------------------------------------------------------------------------------------------------------------------  Chemistries   Recent Labs Lab 01/24/17 0156  01/26/17 0332 01/27/17 0536  NA 138  < > 138 138  K 3.4*  < > 3.6 3.5  CL 99*  < > 105 102  CO2 29  < > 26 27  GLUCOSE 155*  < > 89 81  BUN 13  < > 14 17  CREATININE 0.97  < > 0.96 1.00  CALCIUM 8.8*  < > 7.7* 8.1*  MG 1.2*  < > 1.6*  --   AST 26  --   --   --   ALT 13*  --   --   --   ALKPHOS 51  --   --   --   BILITOT 1.1  --   --   --   < > =  values in this interval not displayed. ------------------------------------------------------------------------------------------------------------------  Cardiac Enzymes  Recent Labs Lab 01/24/17 1957  TROPONINI 0.05*   ------------------------------------------------------------------------------------------------------------------  RADIOLOGY:  Dg Abd 1 View  Result Date: 01/27/2017 CLINICAL DATA:  Duodenal ulcer perforation. Follow-up gastric outlet obstruction. EXAM: ABDOMEN - 1 VIEW COMPARISON:  01/26/2017 and CT from 01/24/2017 FINDINGS: The oral contrast has  advanced beyond the stomach and most of the contrast is within the colon including the rectum. Again noted is gas-filled colon in the right upper abdomen. There may be residual free air in the right upper abdomen as well. Hazy densities at the left lung base. Severe joint space narrowing and degenerative changes at the left hip joint. There appears to be contrast within the appendix. IMPRESSION: Oral contrast has moved through the stomach and most of the contrast is now in the colon. No evidence for bowel obstruction. Gas in the right upper abdomen likely representing a combination of a gas-filled colon and residual free air. Limited evaluation for free air on these portable supine images. Electronically Signed   By: Markus Daft M.D.   On: 01/27/2017 08:18   Dg Ugi  W/kub  Result Date: 01/26/2017 CLINICAL DATA:  Clinical suspicion of duodenal ulcer perforation recently. Clinically improved. EXAM: UPPER GI SERIES WITH KUB TECHNIQUE: After obtaining a scout radiograph a routine upper GI series was performed using thick and thin barium. Effervescent crystals were not administered. FLUOROSCOPY TIME:  Fluoroscopy Time:  2 minutes Radiation Exposure Index (if provided by the fluoroscopic device): 4992 micro Gy per meters square Number of Acquired Spot Images: 7 COMPARISON:  Abdominopelvic CT scan of January 24, 2017 FINDINGS: The patient was nauseous at the outset of the procedure. The patient ingested the Isovue-300 through a straw. Approximately 125 cc were administered. The thoracic esophagus was normal in appearance. The stomach distended reasonably well given the limited volume of contrast administered. Gastric emptying however was very delayed and never completely achieved. A tiny amount of contrast appeared to be attempting to exit the stomach into the duodenal bulb. Gastric peristalsis was normal. However, over the course of approximately 10 minutes the contrast was never able to traverse the presumably  edematous duodenal bulb. IMPRESSION: Near total obstruction of gastric emptying presumably due to an edematous duodenal bulb. No anatomic information of the duodenum is available. Abdominal CT scanning may be the most useful next imaging step. Electronically Signed   By: David  Martinique M.D.   On: 01/26/2017 11:10     ASSESSMENT AND PLAN:   82 year old female with past medical history of hypertension, hyperlipidemia, diabetes, COPD, coronary artery stenosis with history of breast cancer presented to the hospital due to abdominal pain and noted to have a perforated viscus on CT scan. Patient was also noted to have a mildly elevated troponin.  1. Abdominal pain-secondary to a perforated viscus is seen on CT scan of the abdomen and pelvis. Patient Remains clinically asymptomatic. -Had an upper GI series yesterday which was nondiagnostic which showed a near total obstruction of the gastric emptying Due to edematous duodenal bulb.  KUB this morning showing contrast has moved to the colon all the way through. Discussed with surgery and plan to start clear liquids by tomorrow and started on sips of liquids at meds for now. -Plan for nonsurgical intervention presently. - cont. Empiric Zosyn and supportive care.   2. Elevated troponin-secondary to supply demand ischemia, no evidence of acute coronary syndrome. -Troponins did not trend upwards.  -Patient's echocardiogram did show LV dysfunction  and cardiology recommends outpatient stress test for risk stratification. - cont. Heparin gtt for now.  Cont. Toprol, Losartan, Atorvastatin for now.   3. 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 A vascular surgery consult obtained. They recommended continuing heparin, Plavix. No plans for angiogram or any other further intervention. Patient is clinically asymptomatic.  4. Hypothyroidism-continue Synthroid.  5. Diabetes type 2 without  compensation-continue Levemir, sliding scale insulin.  6. Glaucoma-continue dorzolamide, latanoprost eyedrops.  7. COPD-no acute exacerbation. Continue Dulera  8. Hx of Breast Cancer - cont. Arimidex.   9. Hx of paroxysmal atrial fibrillation-rate controlled. Continue metoprolol.  Cont. Heparin gtt - plan for starting Eliquis once pt. Can start taking PO.  No plans for surgical intervention as per surgery.    10. Acute on chronic systolic CHF - improved w/ IV diuresis.  Switched to Oral Lasix today, cont. Toprol, low dose losartan started.  - appreciate Cards input.  Follow I's and O's and daily weights.   All the records are reviewed and case discussed with Care Management/Social Worker. Management plans discussed with the patient, family and they are in agreement.  CODE STATUS: Full code  DVT Prophylaxis:   TOTAL TIME TAKING CARE OF THIS PATIENT: 25 minutes.   POSSIBLE D/C IN 1-2 DAYS, DEPENDING ON CLINICAL CONDITION.   Henreitta Leber M.D on 01/27/2017 at 2:49 PM  Between 7am to 6pm - Pager - [phone]  After 6pm go to [email or web address] - Proofreader  [facility]  Office  [phone]  CC: Primary care physician; [name], MD

## 2017-01-27 NOTE — Progress Notes (Signed)
[place] for heparin drip Indication: chest pain/ACS/NSTEMI  Allergies  Allergen Reactions  . Propoxyphene Other (See Comments)    Other Reaction: Severe Headache  . Advil [Ibuprofen] Other (See Comments)    Makes her heart race  . Darvocet [Propoxyphene N-Acetaminophen]   . Percocet [Oxycodone-Acetaminophen]     hallucination    Patient Measurements: Height: 5\' 7"  (170.2 cm) Weight: 180 lb 9.6 oz (81.9 kg) IBW/kg (Calculated) : 61.6 Heparin Dosing Weight: 80 kg  Vital Signs: Temp: 98.4 F (36.9 C) (10/10 0505) Temp Source: Oral (10/10 0505) BP: 136/86 (10/10 0505) Pulse Rate: 78 (10/10 0505)  Labs:  Recent Labs  01/24/17 0739 01/24/17 1326  01/24/17 1957  01/25/17 0606 01/25/17 1345 01/26/17 0332 01/27/17 0536  HGB  --   --   --   --   < > 10.2*  --  9.6* 11.1*  HCT  --   --   --   --   --  30.5*  --  29.1* 34.0*  PLT  --   --   --   --   --  128*  --  130* 159  HEPARINUNFRC  --   --   < > 0.29*  --  0.41 0.49 0.49 0.68  CREATININE  --   --   --   --   --  0.97  --  0.96 1.00  TROPONINI 0.04* 0.05*  --  0.05*  --   --   --   --   --   < > = values in this interval not displayed.  Estimated Creatinine Clearance: 49.4 mL/min (by C-G formula based on SCr of 1 mg/dL).  Medications:  No anticoagulation in PTA meds.  Assessment: 82 yo female started on heparin drip for NSTEM was stopped, now being restarted. Possible mesenteric artery thrombus/stenosis noted on CT scan as well.  Goal of Therapy:  Heparin level 0.3-0.7 units/ml Monitor platelets by anticoagulation protocol: Yes   Plan:  Restart heparin drip at 950 units/hr (=9.5 ml/hr). First heparin level 8 hours after restart of infusion. CBC in AM  10/7 1957 HL subtherapeutic x 1. 1200 units IV x 1 bolus and increase rate to 1100 units/hr. Recheck HL in 8 hours.   10/8 0606 HL Therapeutic x 1 at 0.41. Will continue current rate of 1100units/hr and recheck HL in 8  hours.   10/8 1345 HL Therapeutic x 2 at 0.49. Will continue current rate of 1100units/hr and recheck HL with AM labs. CBC daily while on heparin per protocol.   10/9 @ 0330 HL 0.49 therapeutic. Will continue current rate and will recheck w/ am labs. Will monitor CBC w/ am labs.  10/10 @ 0536 HL 0.68 therapeutic, but large jump from 0.49. Will decrease rate to 1000 units/hr and recheck HL @ 1330.  Tobie Lords, PharmD, BCPS Clinical Pharmacist 01/27/2017 7:15 AM

## 2017-01-27 NOTE — Progress Notes (Signed)
Patient remains hemodynamically stable,heparin drip continues,iv antibiotics continue.

## 2017-01-27 NOTE — Progress Notes (Signed)
[place] for heparin drip Indication: chest pain/ACS/NSTEMI  Allergies  Allergen Reactions  . Propoxyphene Other (See Comments)    Other Reaction: Severe Headache  . Advil [Ibuprofen] Other (See Comments)    Makes her heart race  . Darvocet [Propoxyphene N-Acetaminophen]   . Percocet [Oxycodone-Acetaminophen]     hallucination    Patient Measurements: Height: 5\' 7"  (170.2 cm) Weight: 180 lb 9.6 oz (81.9 kg) IBW/kg (Calculated) : 61.6 Heparin Dosing Weight: 80 kg  Vital Signs: Temp: 97.3 F (36.3 C) (10/10 0724) Temp Source: Oral (10/10 0724) BP: 120/58 (10/10 1142) Pulse Rate: 69 (10/10 1142)  Labs:  Recent Labs  01/24/17 1957  01/25/17 0606  01/26/17 0332 01/27/17 0536 01/27/17 1332  HGB  --   < > 10.2*  --  9.6* 11.1*  --   HCT  --   --  30.5*  --  29.1* 34.0*  --   PLT  --   --  128*  --  130* 159  --   HEPARINUNFRC 0.29*  --  0.41  < > 0.49 0.68 0.52  CREATININE  --   --  0.97  --  0.96 1.00  --   TROPONINI 0.05*  --   --   --   --   --   --   < > = values in this interval not displayed.  Estimated Creatinine Clearance: 49.4 mL/min (by C-G formula based on SCr of 1 mg/dL).  Medications:  No anticoagulation in PTA meds.  Assessment: 82 yo female started on heparin drip for NSTEM was stopped, now being restarted. Possible mesenteric artery thrombus/stenosis noted on CT scan as well.  Goal of Therapy:  Heparin level 0.3-0.7 units/ml Monitor platelets by anticoagulation protocol: Yes   Plan:  Restart heparin drip at 950 units/hr (=9.5 ml/hr). First heparin level 8 hours after restart of infusion. CBC in AM  10/7 1957 HL subtherapeutic x 1. 1200 units IV x 1 bolus and increase rate to 1100 units/hr. Recheck HL in 8 hours.   10/8 0606 HL Therapeutic x 1 at 0.41. Will continue current rate of 1100units/hr and recheck HL in 8 hours.   10/8 1345 HL Therapeutic x 2 at 0.49. Will continue current rate of 1100units/hr and  recheck HL with AM labs. CBC daily while on heparin per protocol.   10/9 @ 0330 HL 0.49 therapeutic. Will continue current rate and will recheck w/ am labs. Will monitor CBC w/ am labs.  10/10 @ 0536 HL 0.68 therapeutic, but large jump from 0.49. Will decrease rate to 1000 units/hr and recheck HL @ 1330.  10/10 @ 1332 HL 0.52- therapeutic. Will continue current heparin rate of 1000u/hr and recheck HL with am labs.   Pernell Dupre, PharmD, BCPS Clinical Pharmacist 01/27/2017 2:39 PM

## 2017-01-27 NOTE — Progress Notes (Signed)
SURGICAL PROGRESS NOTE (cpt [phone])  Hospital Day(s): 3.   Post op day(s):  Mia Padilla   Interval History: Patient seen and examined, no acute events or new complaints overnight. Patient again reports she feels well with +flatus and tolerating ice chips, denies abdominal pain, N/V, fever/chills, CP, or SOB.  Review of Systems:  Constitutional: denies fever, chills  HEENT: denies cough or congestion  Respiratory: denies any shortness of breath  Cardiovascular: denies chest pain or palpitations  Gastrointestinal: abdominal pain, N/V, and bowel function as per interval history Genitourinary: denies burning with urination or urinary frequency Musculoskeletal: denies pain, decreased motor or sensation Integumentary: denies any other rashes or skin discolorations Neurological: denies HA or vision/hearing changes   Vital signs in last 24 hours: [min-max] current  Temp:  [97.3 F (36.3 C)-98.4 F (36.9 C)] 97.3 F (36.3 C) (10/10 0724) Pulse Rate:  [76-91] 81 (10/10 0724) Resp:  [16-18] 16 (10/10 0724) BP: (134-156)/(75-89) 156/75 (10/10 0724) SpO2:  [93 %-96 %] 96 % (10/10 0724) Weight:  [180 lb 9.6 oz (81.9 kg)] 180 lb 9.6 oz (81.9 kg) (10/10 0505)     Height: 5\' 7"  (170.2 cm) Weight: 180 lb 9.6 oz (81.9 kg) BMI (Calculated): 28.28   Intake/Output this shift:  No intake/output data recorded.   Intake/Output last 2 shifts:  @IOLAST2SHIFTS @   Physical Exam:  Constitutional: alert, cooperative and no distress  HENT: normocephalic without obvious abnormality  Eyes: PERRL, EOM's grossly intact and symmetric  Neuro: CN II - XII grossly intact and symmetric without deficit  Respiratory: breathing non-labored at rest  Cardiovascular: regular rate and sinus rhythm  Gastrointestinal: soft, non-tender, and non-distended Musculoskeletal: UE and LE FROM, no edema or wounds, motor and sensation grossly intact, NT   Labs:  CBC Latest Ref Rng & Units 01/27/2017 01/26/2017 01/25/2017  WBC 3.6 - 11.0  K/uL 3.1(L) 2.7(L) 3.4(L)  Hemoglobin 12.0 - 16.0 g/dL 11.1(L) 9.6(L) 10.2(L)  Hematocrit 35.0 - 47.0 % 34.0(L) 29.1(L) 30.5(L)  Platelets 150 - 440 K/uL 159 130(L) 128(L)   CMP Latest Ref Rng & Units 01/27/2017 01/26/2017 01/25/2017  Glucose 65 - 99 mg/dL 81 89 99  BUN 6 - 20 mg/dL 17 14 11   Creatinine 0.44 - 1.00 mg/dL 1.00 0.96 0.97  Sodium 135 - 145 mmol/L 138 138 138  Potassium 3.5 - 5.1 mmol/L 3.5 3.6 3.6  Chloride 101 - 111 mmol/L 102 105 104  CO2 22 - 32 mmol/L 27 26 26   Calcium 8.9 - 10.3 mg/dL 8.1(L) 7.7(L) 8.1(L)  Total Protein 6.5 - 8.1 g/dL - - -  Total Bilirubin 0.3 - 1.2 mg/dL - - -  Alkaline Phos 38 - 126 U/L - - -  AST 15 - 41 U/L - - -  ALT 14 - 54 U/L - - -   Imaging studies:  Abdominal X-ray (01/27/2017) - personally reviewed with patient and her family bedside The oral contrast has advanced beyond the stomach and most of the contrast is within the colon including the rectum. Again noted is gas-filled colon in the right upper abdomen. There may be residual free air in the right upper abdomen as well. Hazy densities at the left lung base. Severe joint space narrowing and degenerative changes at the left hip joint. There appears to be contrast within the appendix. No evidence for bowel obstruction.   Assessment/Plan:(ICD-10's: K26.1) 82 y.o.femalewith near-obstruction of duodenal bulb secondary to duodenal inflammation/edema s/p perforated duodenal ulcer with resolution of symptoms and without contrast extravasation on UGI with  KUB today, being managed non-operatively due to elevated Troponin initially concerning for NSTEMI, along with therapeutic heparin infusion and Plavix for SMA embolus attributed to atrial fibrillation no longer on anticoagulation (at time of adission) due to patient fall risk, CHF with LV dysfunction, and complicated also by pertinent comorbidities including obesity (BMI >30), DM, HTN, HLD, CAD with atrial fibrillation, CHF with LV  dysfunction, COPD, hypothyroidism, seizure disorder, history of breast cancer on Arimidex, advanced age, and bowel + urinary incontinence.  - ice chips and sips of clear liquids for now, IVF - PPI, antibiotics, and therapeutic anticoagulation - anticipate slow advancement to clear liquids as tolerated tomorrow - continue non-operative management considering asymptomatic with therapeutic anticoagulation and Plavix for SMA embolus, elevated troponin, and comorbidities - medical management of comorbidities per medical team             - may need to consider TPN if prolonged NPO status - outpatient cardiac stress testing as per cardiology - monitor abdominal exam and bowel function  All of the above findings and recommendations were discussed with the patient and her family, and all of patient's and her family's questions were answered to their expressed satisfaction.  Thank you for the opportunity to participate in this patient's care.  -- Marilynne Drivers Rosana Hoes, MD, [place]: [facility] - Partnering for exceptional care. Office: [phone]

## 2017-01-28 ENCOUNTER — Telehealth: Payer: Self-pay | Admitting: Family Medicine

## 2017-01-28 LAB — BASIC METABOLIC PANEL
Anion gap: 6 (ref 5–15)
BUN: 14 mg/dL (ref 6–20)
CALCIUM: 8.2 mg/dL — AB (ref 8.9–10.3)
CO2: 29 mmol/L (ref 22–32)
CREATININE: 1.01 mg/dL — AB (ref 0.44–1.00)
Chloride: 103 mmol/L (ref 101–111)
GFR calc non Af Amer: 51 mL/min — ABNORMAL LOW (ref >60)
GFR, EST AFRICAN AMERICAN: 59 mL/min — AB (ref >60)
Glucose, Bld: 87 mg/dL (ref 65–99)
Potassium: 3.4 mmol/L — ABNORMAL LOW (ref 3.5–5.1)
SODIUM: 138 mmol/L (ref 135–145)

## 2017-01-28 LAB — CBC
HEMATOCRIT: 32.3 % — AB (ref 35.0–47.0)
Hemoglobin: 10.5 g/dL — ABNORMAL LOW (ref 12.0–16.0)
MCH: 30.8 pg (ref 26.0–34.0)
MCHC: 32.6 g/dL (ref 32.0–36.0)
MCV: 94.7 fL (ref 80.0–100.0)
Platelets: 161 10*3/uL (ref 150–440)
RBC: 3.41 MIL/uL — ABNORMAL LOW (ref 3.80–5.20)
RDW: 15.9 % — AB (ref 11.5–14.5)
WBC: 2.6 10*3/uL — ABNORMAL LOW (ref 3.6–11.0)

## 2017-01-28 LAB — GLUCOSE, CAPILLARY
Glucose-Capillary: 80 mg/dL (ref 65–99)
Glucose-Capillary: 77 mg/dL (ref 65–99)
Glucose-Capillary: 128 mg/dL — ABNORMAL HIGH (ref 65–99)

## 2017-01-28 LAB — HEPARIN LEVEL (UNFRACTIONATED): HEPARIN UNFRACTIONATED: 0.5 [IU]/mL (ref 0.30–0.70)

## 2017-01-28 LAB — MAGNESIUM: MAGNESIUM: 1.6 mg/dL — AB (ref 1.7–2.4)

## 2017-01-28 MED ORDER — ENSURE ENLIVE PO LIQD
237.0000 mL | Freq: Two times a day (BID) | ORAL | Status: DC
  Administered 2017-01-28 – 2017-01-31 (×6): 237 mL via ORAL

## 2017-01-28 NOTE — Plan of Care (Signed)
Problem: Safety: Goal: Ability to remain free from injury will improve Outcome: Progressing  Patient's VSS throughout the shift. Patient denies pain. Heparin drip at 10 ml/hr. Patient resting well. RN will continue to monitor.

## 2017-01-28 NOTE — Progress Notes (Signed)
[facility] [place] NAME: Mia Padilla    MR#:  [id number]  DATE OF BIRTH:  1934-07-05  SUBJECTIVE:   Patient denies any abdominal pain or chest pain. No other acute events overnight. Plan to start clear liquids today as per general surgery.  REVIEW OF SYSTEMS:    Review of Systems  Constitutional: Negative for chills and fever.  HENT: Negative for congestion and tinnitus.   Eyes: Negative for blurred vision and double vision.  Respiratory: Negative for cough, shortness of breath and wheezing.   Cardiovascular: Negative for chest pain, orthopnea and PND.  Gastrointestinal: Negative for abdominal pain, diarrhea, nausea and vomiting.  Genitourinary: Negative for dysuria and hematuria.  Neurological: Negative for dizziness, sensory change and focal weakness.  All other systems reviewed and are negative.   Nutrition: Clear liquids Tolerating Diet: Yes Tolerating PT: Await Eval.      DRUG ALLERGIES:   Allergies  Allergen Reactions  . Propoxyphene Other (See Comments)    Other Reaction: Severe Headache  . Advil [Ibuprofen] Other (See Comments)    Makes her heart race  . Darvocet [Propoxyphene N-Acetaminophen]   . Percocet [Oxycodone-Acetaminophen]     hallucination    VITALS:  Blood pressure 136/60, pulse 68, temperature 97.8 F (36.6 C), temperature source Oral, resp. rate 18, height 5\' 7"  (1.702 m), weight 82.2 kg (181 lb 4.8 oz), SpO2 96 %.  PHYSICAL EXAMINATION:   Physical Exam  GENERAL:  82 y.o.-year-old patient lying in bed in no acute distress.  EYES: Pupils equal, round, reactive to light and accommodation. No scleral icterus. Extraocular muscles intact.  HEENT: Head atraumatic, normocephalic. Oropharynx and nasopharynx clear.  NECK:  Supple, no jugular venous distention. No thyroid enlargement, no tenderness.  LUNGS: Normal breath sounds bilaterally, no wheezing, faint rales @ bases, No rhonchi. No use of accessory  muscles of respiration.  CARDIOVASCULAR: S1, S2 normal. No murmurs, rubs, or gallops.  ABDOMEN: Soft, nontender, nondistended. Bowel sounds present. No organomegaly or mass.  EXTREMITIES: No cyanosis, clubbing or edema b/l.    NEUROLOGIC: Cranial nerves II through XII are intact. No focal Motor or sensory deficits b/l.   PSYCHIATRIC: The patient is alert and oriented x 3.  SKIN: No obvious rash, lesion, or ulcer.    LABORATORY PANEL:   CBC  Recent Labs Lab 01/28/17 0416  WBC 2.6*  HGB 10.5*  HCT 32.3*  PLT 161   ------------------------------------------------------------------------------------------------------------------  Chemistries   Recent Labs Lab 01/24/17 0156  01/28/17 0416  NA 138  < > 138  K 3.4*  < > 3.4*  CL 99*  < > 103  CO2 29  < > 29  GLUCOSE 155*  < > 87  BUN 13  < > 14  CREATININE 0.97  < > 1.01*  CALCIUM 8.8*  < > 8.2*  MG 1.2*  < > 1.6*  AST 26  --   --   ALT 13*  --   --   ALKPHOS 51  --   --   BILITOT 1.1  --   --   < > = values in this interval not displayed. ------------------------------------------------------------------------------------------------------------------  Cardiac Enzymes  Recent Labs Lab 01/24/17 1957  TROPONINI 0.05*   ------------------------------------------------------------------------------------------------------------------  RADIOLOGY:  Dg Abd 1 View  Result Date: 01/27/2017 CLINICAL DATA:  Duodenal ulcer perforation. Follow-up gastric outlet obstruction. EXAM: ABDOMEN - 1 VIEW COMPARISON:  01/26/2017 and CT from 01/24/2017 FINDINGS: The oral contrast has advanced beyond the stomach  and most of the contrast is within the colon including the rectum. Again noted is gas-filled colon in the right upper abdomen. There may be residual free air in the right upper abdomen as well. Hazy densities at the left lung base. Severe joint space narrowing and degenerative changes at the left hip joint. There appears to be  contrast within the appendix. IMPRESSION: Oral contrast has moved through the stomach and most of the contrast is now in the colon. No evidence for bowel obstruction. Gas in the right upper abdomen likely representing a combination of a gas-filled colon and residual free air. Limited evaluation for free air on these portable supine images. Electronically Signed   By: Markus Daft M.D.   On: 01/27/2017 08:18     ASSESSMENT AND PLAN:   82 year old female with past medical history of hypertension, hyperlipidemia, diabetes, COPD, coronary artery stenosis with history of breast cancer presented to the hospital due to abdominal pain and noted to have a perforated viscus on CT scan. Patient was also noted to have a mildly elevated troponin.  1. Abdominal pain-secondary to a perforated viscus is seen on CT scan of the abdomen and pelvis. Patient continues to Remains clinically asymptomatic. -Had an upper GI series which was nondiagnostic which showed a near total obstruction of the gastric emptying Due to edematous duodenal bulb.  KUB yesterday a.m. showing contrast has moved to the colon all the way through. Discussed with surgery and plan to start clear liquids today and advance as tolerated.  -Plan for nonsurgical intervention presently. - cont. Empiric Zosyn and supportive care.   2. Elevated troponin-secondary to supply demand ischemia, no evidence of acute coronary syndrome. -Troponins did not trend upwards.  -Patient's echocardiogram did show LV dysfunction and cardiology recommends outpatient stress test for risk stratification. - cont. Heparin gtt and will switch to Oral Eliquis in a.m.   Cont. Toprol, Losartan, Atorvastatin for now.   3. 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 A vascular surgery consult obtained. They recommended continuing heparin, Plavix. No plans for angiogram or any other further intervention. Patient is  clinically asymptomatic.  4. Hypothyroidism-continue Synthroid.  5. Diabetes type 2 without compensation-continue SSI, d/c Levemir as BS was on low side.  Asymptomatic.   6. Glaucoma-continue dorzolamide, latanoprost eyedrops.  7. COPD-no acute exacerbation. Continue Dulera  8. Hx of Breast Cancer - cont. Arimidex.   9. Hx of paroxysmal atrial fibrillation-rate controlled. Continue metoprolol.  Cont. Heparin gtt - plan for starting Eliquis in a.m. Tomorrow.   No plans for surgical intervention as per surgery.    10. Acute on chronic systolic CHF - improved w/ IV diuresis.  Cont. Lasix, cont. Toprol, low dose losartan started.  - appreciate Cards input.    All the records are reviewed and case discussed with Care Management/Social Worker. Management plans discussed with the patient, family and they are in agreement.  CODE STATUS: Full code  DVT Prophylaxis:   TOTAL TIME TAKING CARE OF THIS PATIENT: 30 minutes.   POSSIBLE D/C IN 1-2 DAYS, DEPENDING ON CLINICAL CONDITION.   Henreitta Leber M.D on 01/28/2017 at 2:46 PM  Between 7am to 6pm - Pager - [phone]  After 6pm go to [email or web address] - Proofreader  [facility]  Office  [phone]  CC: Primary care physician; [name], MD

## 2017-01-28 NOTE — Progress Notes (Signed)
CC: Perforated DU Subjective: This patient with perforated duodenal ulcer multiple medical problems including demand ischemia. Currently she is completely pain-free has had some sips of liquids for low glucose recently without any difficulty. Family members present  Objective: Vital signs in last 24 hours: Temp:  [97.6 F (36.4 C)-97.8 F (36.6 C)] 97.8 F (36.6 C) (10/11 1100) Pulse Rate:  [67-74] 68 (10/11 1100) Resp:  [18] 18 (10/11 1100) BP: (119-157)/(60-87) 136/60 (10/11 1100) SpO2:  [96 %-98 %] 96 % (10/11 1100) Weight:  [181 lb 4.8 oz (82.2 kg)] 181 lb 4.8 oz (82.2 kg) (10/11 0500) Last BM Date: 01/27/17  Intake/Output from previous day: 10/10 0701 - 10/11 0700 In: 180 [I.V.:80; IV Piggyback:100] Out: 300 [Urine:300] Intake/Output this shift: Total I/O In: -  Out: 200 [Urine:200]  Physical exam:  Awake and alert Vital signs stable Abdomen is soft nontender no guarding no rebound no percussion tenderness  Lab Results: CBC   Recent Labs  01/27/17 0536 01/28/17 0416  WBC 3.1* 2.6*  HGB 11.1* 10.5*  HCT 34.0* 32.3*  PLT 159 161   BMET  Recent Labs  01/27/17 0536 01/28/17 0416  NA 138 138  K 3.5 3.4*  CL 102 103  CO2 27 29  GLUCOSE 81 87  BUN 17 14  CREATININE 1.00 1.01*  CALCIUM 8.1* 8.2*   PT/INR No results for input(s): LABPROT, INR in the last 72 hours. ABG No results for input(s): PHART, HCO3 in the last 72 hours.  Invalid input(s): PCO2, PO2  Studies/Results: Dg Abd 1 View  Result Date: 01/27/2017 CLINICAL DATA:  Duodenal ulcer perforation. Follow-up gastric outlet obstruction. EXAM: ABDOMEN - 1 VIEW COMPARISON:  01/26/2017 and CT from 01/24/2017 FINDINGS: The oral contrast has advanced beyond the stomach and most of the contrast is within the colon including the rectum. Again noted is gas-filled colon in the right upper abdomen. There may be residual free air in the right upper abdomen as well. Hazy densities at the left lung base.  Severe joint space narrowing and degenerative changes at the left hip joint. There appears to be contrast within the appendix. IMPRESSION: Oral contrast has moved through the stomach and most of the contrast is now in the colon. No evidence for bowel obstruction. Gas in the right upper abdomen likely representing a combination of a gas-filled colon and residual free air. Limited evaluation for free air on these portable supine images. Electronically Signed   By: Markus Daft M.D.   On: 01/27/2017 08:18    Anti-infectives: Anti-infectives    Start     Dose/Rate Route Frequency Ordered Stop   01/24/17 1000  amoxicillin-clavulanate (AUGMENTIN) 500-125 MG per tablet 500 mg  Status:  Discontinued     1 tablet Oral 2 times daily 01/24/17 0633 01/24/17 0634   01/24/17 0800  piperacillin-tazobactam (ZOSYN) IVPB 3.375 g     3.375 g 12.5 mL/hr over 240 Minutes Intravenous Every 8 hours 01/24/17 0640        Assessment/Plan:  Discuss with prime doc. We will start clear liquids and some in sure and likely be able to advance diet tomorrow I do not think the patient needs TPN at this point.  Florene Glen, MD, FACS  01/28/2017

## 2017-01-28 NOTE — Telephone Encounter (Addendum)
Received a call from [facility] requesting that Dr Jeananne Rama refax the form to them on patient per [facility] it was signed but not dated.     Thanks  Fax # [phone]

## 2017-01-28 NOTE — Progress Notes (Signed)
[facility] for heparin drip Indication: chest pain/ACS/NSTEMI  Allergies  Allergen Reactions  . Propoxyphene Other (See Comments)    Other Reaction: Severe Headache  . Advil [Ibuprofen] Other (See Comments)    Makes her heart race  . Darvocet [Propoxyphene N-Acetaminophen]   . Percocet [Oxycodone-Acetaminophen]     hallucination    Patient Measurements: Height: 5\' 7"  (170.2 cm) Weight: 181 lb 4.8 oz (82.2 kg) IBW/kg (Calculated) : 61.6 Heparin Dosing Weight: 80 kg  Vital Signs: Temp: 97.7 F (36.5 C) (10/11 0317) Temp Source: Oral (10/11 0317) BP: 119/73 (10/11 0317) Pulse Rate: 67 (10/11 0317)  Labs:  Recent Labs  01/26/17 0332 01/27/17 0536 01/27/17 1332 01/28/17 0416  HGB 9.6* 11.1*  --  10.5*  HCT 29.1* 34.0*  --  32.3*  PLT 130* 159  --  161  HEPARINUNFRC 0.49 0.68 0.52 0.50  CREATININE 0.96 1.00  --  1.01*    Estimated Creatinine Clearance: 49 mL/min (A) (by C-G formula based on SCr of 1.01 mg/dL (H)).  Medications:  No anticoagulation in PTA meds.  Assessment: 82 yo female started on heparin drip for NSTEM was stopped, now being restarted. Possible mesenteric artery thrombus/stenosis noted on CT scan as well.  Goal of Therapy:  Heparin level 0.3-0.7 units/ml Monitor platelets by anticoagulation protocol: Yes   Plan:  Restart heparin drip at 950 units/hr (=9.5 ml/hr). First heparin level 8 hours after restart of infusion. CBC in AM  10/7 1957 HL subtherapeutic x 1. 1200 units IV x 1 bolus and increase rate to 1100 units/hr. Recheck HL in 8 hours.   10/8 0606 HL Therapeutic x 1 at 0.41. Will continue current rate of 1100units/hr and recheck HL in 8 hours.   10/8 1345 HL Therapeutic x 2 at 0.49. Will continue current rate of 1100units/hr and recheck HL with AM labs. CBC daily while on heparin per protocol.   10/9 @ 0330 HL 0.49 therapeutic. Will continue current rate and will recheck w/ am labs. Will monitor CBC  w/ am labs.  10/10 @ 0536 HL 0.68 therapeutic, but large jump from 0.49. Will decrease rate to 1000 units/hr and recheck HL @ 1330.  10/10 @ 1332 HL 0.52- therapeutic. Will continue current heparin rate of 1000u/hr and recheck HL with am labs.   10/11 @ 0411 HL 0.50 therapeutic. Will continue current rate and will recheck HL/CBC w/ am labs.  Tobie Lords, PharmD, BCPS Clinical Pharmacist 01/28/2017 6:08 AM

## 2017-01-29 LAB — CBC
HCT: 32.2 % — ABNORMAL LOW (ref 35.0–47.0)
HEMOGLOBIN: 10.6 g/dL — AB (ref 12.0–16.0)
MCH: 31 pg (ref 26.0–34.0)
MCHC: 32.8 g/dL (ref 32.0–36.0)
MCV: 94.7 fL (ref 80.0–100.0)
PLATELETS: 157 10*3/uL (ref 150–440)
RBC: 3.4 MIL/uL — AB (ref 3.80–5.20)
RDW: 15.9 % — ABNORMAL HIGH (ref 11.5–14.5)
WBC: 2.3 10*3/uL — AB (ref 3.6–11.0)

## 2017-01-29 LAB — GLUCOSE, CAPILLARY
Glucose-Capillary: 90 mg/dL (ref 65–99)
Glucose-Capillary: 128 mg/dL — ABNORMAL HIGH (ref 65–99)
Glucose-Capillary: 181 mg/dL — ABNORMAL HIGH (ref 65–99)
Glucose-Capillary: 99 mg/dL (ref 65–99)
Glucose-Capillary: 109 mg/dL — ABNORMAL HIGH (ref 65–99)

## 2017-01-29 LAB — HEPARIN LEVEL (UNFRACTIONATED): HEPARIN UNFRACTIONATED: 0.42 [IU]/mL (ref 0.30–0.70)

## 2017-01-29 MED ORDER — APIXABAN 5 MG PO TABS
5.0000 mg | ORAL_TABLET | Freq: Two times a day (BID) | ORAL | Status: DC
  Administered 2017-01-29 – 2017-01-31 (×5): 5 mg via ORAL
  Filled 2017-01-29 (×5): qty 1

## 2017-01-29 NOTE — Progress Notes (Signed)
CC: perforated duodenal ulcer Subjective: Patient feels well today she is having no abdominal pain no nausea vomiting has been tolerating a diet.  Objective: Vital signs in last 24 hours: Temp:  [97.8 F (36.6 C)-98 F (36.7 C)] 98 F (36.7 C) (10/12 0828) Pulse Rate:  [72-85] 85 (10/12 0828) BP: (113-130)/(58-70) 125/70 (10/12 0828) SpO2:  [94 %-96 %] 96 % (10/12 0828) Last BM Date: 01/27/17  Intake/Output from previous day: 10/11 0701 - 10/12 0700 In: 360 [P.O.:360] Out: 750 [Urine:750] Intake/Output this shift: Total I/O In: 750 [P.O.:750] Out: -   Physical exam:  Vital signs are reviewed and stable abdomen is soft and nontenderno icterus no jaundice  Lab Results: CBC   Recent Labs  01/28/17 0416 01/29/17 0404  WBC 2.6* 2.3*  HGB 10.5* 10.6*  HCT 32.3* 32.2*  PLT 161 157   BMET  Recent Labs  01/27/17 0536 01/28/17 0416  NA 138 138  K 3.5 3.4*  CL 102 103  CO2 27 29  GLUCOSE 81 87  BUN 17 14  CREATININE 1.00 1.01*  CALCIUM 8.1* 8.2*   PT/INR No results for input(s): LABPROT, INR in the last 72 hours. ABG No results for input(s): PHART, HCO3 in the last 72 hours.  Invalid input(s): PCO2, PO2  Studies/Results: No results found.  Anti-infectives: Anti-infectives    Start     Dose/Rate Route Frequency Ordered Stop   01/24/17 1000  amoxicillin-clavulanate (AUGMENTIN) 500-125 MG per tablet 500 mg  Status:  Discontinued     1 tablet Oral 2 times daily 01/24/17 0633 01/24/17 0634   01/24/17 0800  piperacillin-tazobactam (ZOSYN) IVPB 3.375 g     3.375 g 12.5 mL/hr over 240 Minutes Intravenous Every 8 hours 01/24/17 0640        Assessment/Plan:  Labs are reviewed. Patient doing very well no sign of sepsis. She has essentially no symptoms of her abdomen. We can advance diet. She should stay on PPIs and follow-up with her GI physician at some point as she will require an endoscopy at some point. She does not need a follow-up with general surgery  unless she has additional problems. We'll sign off and be available as needed.  Florene Glen, MD, FACS  01/29/2017

## 2017-01-29 NOTE — Care Management (Signed)
Informed that patient to discharge home on new Eliquis.  Provided daughter with 30 day trial coupon.  Updated Advanced  that patient could discharge home over the weekend.  PT consult is pending. Her clear liquid diet has been advanced to full and at present, she is tolerating this without difficulty.  Has access to a walker

## 2017-01-29 NOTE — Progress Notes (Signed)
[facility] at [place] NAME: Mia Padilla    MR#:  [id number]  DATE OF BIRTH:  03-14-1935  SUBJECTIVE:   Patient denies any abdominal pain or chest pain. No other acute events overnight. Tolerating clear liquids and advance to full liquids today. Clinically otherwise doing well.  REVIEW OF SYSTEMS:    Review of Systems  Constitutional: Negative for chills and fever.  HENT: Negative for congestion and tinnitus.   Eyes: Negative for blurred vision and double vision.  Respiratory: Negative for cough, shortness of breath and wheezing.   Cardiovascular: Negative for chest pain, orthopnea and PND.  Gastrointestinal: Negative for abdominal pain, diarrhea, nausea and vomiting.  Genitourinary: Negative for dysuria and hematuria.  Neurological: Negative for dizziness, sensory change and focal weakness.  All other systems reviewed and are negative.   Nutrition: Full Liquids Tolerating Diet: Yes Tolerating PT: Await Eval.      DRUG ALLERGIES:   Allergies  Allergen Reactions  . Propoxyphene Other (See Comments)    Other Reaction: Severe Headache  . Advil [Ibuprofen] Other (See Comments)    Makes her heart race  . Darvocet [Propoxyphene N-Acetaminophen]   . Percocet [Oxycodone-Acetaminophen]     hallucination    VITALS:  Blood pressure 106/63, pulse 75, temperature 97.6 F (36.4 C), temperature source Oral, resp. rate 18, height 5\' 7"  (1.702 m), weight 82.2 kg (181 lb 4.8 oz), SpO2 98 %.  PHYSICAL EXAMINATION:   Physical Exam  GENERAL:  82 y.o.-year-old patient sitting up in chair in no acute distress.  EYES: Pupils equal, round, reactive to light and accommodation. No scleral icterus. Extraocular muscles intact.  HEENT: Head atraumatic, normocephalic. Oropharynx and nasopharynx clear.  NECK:  Supple, no jugular venous distention. No thyroid enlargement, no tenderness.  LUNGS: Normal breath sounds bilaterally, no wheezing, faint rales @  bases, No rhonchi. No use of accessory muscles of respiration.  CARDIOVASCULAR: S1, S2 normal. No murmurs, rubs, or gallops.  ABDOMEN: Soft, nontender, nondistended. Bowel sounds present. No organomegaly or mass.  EXTREMITIES: No cyanosis, clubbing or edema b/l.    NEUROLOGIC: Cranial nerves II through XII are intact. No focal Motor or sensory deficits b/l. Globally weak.  PSYCHIATRIC: The patient is alert and oriented x 3.  SKIN: No obvious rash, lesion, or ulcer.    LABORATORY PANEL:   CBC  Recent Labs Lab 01/29/17 0404  WBC 2.3*  HGB 10.6*  HCT 32.2*  PLT 157   ------------------------------------------------------------------------------------------------------------------  Chemistries   Recent Labs Lab 01/24/17 0156  01/28/17 0416  NA 138  < > 138  K 3.4*  < > 3.4*  CL 99*  < > 103  CO2 29  < > 29  GLUCOSE 155*  < > 87  BUN 13  < > 14  CREATININE 0.97  < > 1.01*  CALCIUM 8.8*  < > 8.2*  MG 1.2*  < > 1.6*  AST 26  --   --   ALT 13*  --   --   ALKPHOS 51  --   --   BILITOT 1.1  --   --   < > = values in this interval not displayed. ------------------------------------------------------------------------------------------------------------------  Cardiac Enzymes  Recent Labs Lab 01/24/17 1957  TROPONINI 0.05*   ------------------------------------------------------------------------------------------------------------------  RADIOLOGY:  No results found.   ASSESSMENT AND PLAN:   82 year old female with past medical history of hypertension, hyperlipidemia, diabetes, COPD, coronary artery stenosis with history of breast cancer presented to the hospital due  to abdominal pain and noted to have a perforated viscus on CT scan. Patient was also noted to have a mildly elevated troponin.  1. Abdominal pain-secondary to a perforated viscus is seen on CT scan of the abdomen and pelvis. Patient continues to Remains clinically asymptomatic. - Had an upper GI series  which was nondiagnostic which showed a near total obstruction of the gastric emptying Due to edematous duodenal bulb.  KUB yesterday a.m. showing contrast has moved to the colon all the way through.  - started on clear liquid and advanced to full liquid today and then as tolerated.  -Plan for nonsurgical intervention presently. Will d/c Zosyn.    2. Elevated troponin-secondary to supply demand ischemia, no evidence of acute coronary syndrome. -Troponins did not trend upwards.  -Patient's echocardiogram did show LV dysfunction and cardiology recommends outpatient stress test for risk stratification. - cont. Heparin gtt and will switch to Oral Eliquis in a.m.   Cont. Toprol, Losartan, Atorvastatin for now.   3. 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 A vascular surgery consult obtained.  No plans for angiogram or any other further intervention. Patient is clinically asymptomatic.  4. Hypothyroidism-continue Synthroid.  5. Diabetes type 2 without compensation-continue SSI, BS stable.   6. Glaucoma-continue dorzolamide, latanoprost eyedrops.  7. COPD-no acute exacerbation. Continue Dulera  8. Hx of Breast Cancer - cont. Arimidex.   9. Hx of paroxysmal atrial fibrillation-rate controlled. Continue metoprolol.  Off Heparin gtt now and switched to Eliquis today.  - plan for starting Eliquis in a.m. Tomorrow.   No plans for surgical intervention as per surgery.    10. Acute on chronic systolic CHF - improved w/ IV diuresis.  Cont. oral Lasix, cont. Toprol, low dose losartan.  - appreciate Cards input and follow up with them in 1 week after discharge.   Await PT eval.   All the records are reviewed and case discussed with Care Management/Social Worker. Management plans discussed with the patient, family and they are in agreement.  CODE STATUS: Full code  DVT Prophylaxis:   TOTAL TIME TAKING CARE OF THIS PATIENT: 30  minutes.   POSSIBLE D/C IN 1-2 DAYS, DEPENDING ON CLINICAL CONDITION.   Henreitta Leber M.D on 01/29/2017 at 3:00 PM  Between 7am to 6pm - Pager - [phone]  After 6pm go to [email or web address] - Proofreader  [facility]  Office  [phone]  CC: Primary care physician; [name], MD

## 2017-01-29 NOTE — Progress Notes (Signed)
Pharmacy Antibiotic Note  Mia Padilla is a 82 y.o. female admitted on 01/24/2017 with intra-abdominal infection.  Pharmacy has been consulted for Zosyn dosing.  Plan: Zosyn 3.375g IV q8h (4 hour infusion).  Height: 5\' 7"  (170.2 cm) Weight: 181 lb 4.8 oz (82.2 kg) IBW/kg (Calculated) : 61.6  Temp (24hrs), Avg:97.8 F (36.6 C), Min:97.6 F (36.4 C), Max:98 F (36.7 C)   Recent Labs Lab 01/24/17 0156 01/25/17 0606 01/26/17 0332 01/27/17 0536 01/28/17 0416 01/29/17 0404  WBC 6.1 3.4* 2.7* 3.1* 2.6* 2.3*  CREATININE 0.97 0.97 0.96 1.00 1.01*  --     Estimated Creatinine Clearance: 49 mL/min (A) (by C-G formula based on SCr of 1.01 mg/dL (H)).    Allergies  Allergen Reactions  . Propoxyphene Other (See Comments)    Other Reaction: Severe Headache  . Advil [Ibuprofen] Other (See Comments)    Makes her heart race  . Darvocet [Propoxyphene N-Acetaminophen]   . Percocet [Oxycodone-Acetaminophen]     hallucination    Antimicrobials this admission: Zosyn 10/7  >>    >>   Dose adjustments this admission:   Microbiology results: No micro    10/7 UA: pending Thank you for allowing pharmacy to be a part of this patient's care.  Rocky Morel 01/29/2017 2:24 PM

## 2017-01-29 NOTE — Care Management Important Message (Signed)
Important Message  Patient Details  Name: KELYN PONCIANO MRN: [id number] Date of Birth: 1934/12/06   Medicare Important Message Given:  Yes Signed IM notice given   Katrina Stack, RN 01/29/2017, 12:04 PM

## 2017-01-29 NOTE — Progress Notes (Signed)
[place] for heparin drip Indication: chest pain/ACS/NSTEMI  Allergies  Allergen Reactions  . Propoxyphene Other (See Comments)    Other Reaction: Severe Headache  . Advil [Ibuprofen] Other (See Comments)    Makes her heart race  . Darvocet [Propoxyphene N-Acetaminophen]   . Percocet [Oxycodone-Acetaminophen]     hallucination    Patient Measurements: Height: 5\' 7"  (170.2 cm) Weight: 181 lb 4.8 oz (82.2 kg) IBW/kg (Calculated) : 61.6 Heparin Dosing Weight: 80 kg  Vital Signs: Temp: 97.8 F (36.6 C) (10/12 0414) Temp Source: Oral (10/12 0414) BP: 130/69 (10/12 0414) Pulse Rate: 72 (10/12 0414)  Labs:  Recent Labs  01/27/17 0536 01/27/17 1332 01/28/17 0416 01/29/17 0404  HGB 11.1*  --  10.5* 10.6*  HCT 34.0*  --  32.3* 32.2*  PLT 159  --  161 157  HEPARINUNFRC 0.68 0.52 0.50 0.42  CREATININE 1.00  --  1.01*  --     Estimated Creatinine Clearance: 49 mL/min (A) (by C-G formula based on SCr of 1.01 mg/dL (H)).  Medications:  No anticoagulation in PTA meds.  Assessment: 82 yo female started on heparin drip for NSTEM was stopped, now being restarted. Possible mesenteric artery thrombus/stenosis noted on CT scan as well.  Goal of Therapy:  Heparin level 0.3-0.7 units/ml Monitor platelets by anticoagulation protocol: Yes   Plan:  Restart heparin drip at 950 units/hr (=9.5 ml/hr). First heparin level 8 hours after restart of infusion. CBC in AM  10/7 1957 HL subtherapeutic x 1. 1200 units IV x 1 bolus and increase rate to 1100 units/hr. Recheck HL in 8 hours.   10/8 0606 HL Therapeutic x 1 at 0.41. Will continue current rate of 1100units/hr and recheck HL in 8 hours.   10/8 1345 HL Therapeutic x 2 at 0.49. Will continue current rate of 1100units/hr and recheck HL with AM labs. CBC daily while on heparin per protocol.   10/9 @ 0330 HL 0.49 therapeutic. Will continue current rate and will recheck w/ am labs. Will monitor CBC  w/ am labs.  10/10 @ 0536 HL 0.68 therapeutic, but large jump from 0.49. Will decrease rate to 1000 units/hr and recheck HL @ 1330.  10/10 @ 1332 HL 0.52- therapeutic. Will continue current heparin rate of 1000u/hr and recheck HL with am labs.   10/11 @ 0411 HL 0.50 therapeutic. Will continue current rate and will recheck HL/CBC w/ am labs.  10/12 @ 0404 HL 0.42 therapeutic. Will continue current rate and will recheck HL/CBC w/ am labs.  Tobie Lords, PharmD, BCPS Clinical Pharmacist 01/29/2017 5:54 AM

## 2017-01-29 NOTE — Discharge Instructions (Signed)
Information on my medicine - ELIQUIS (apixaban)  This medication education was reviewed with me or my healthcare representative as part of my discharge preparation.  The pharmacist that spoke with me during my hospital stay was:  Candelaria Stagers, [facility]  Why was Eliquis prescribed for you? Eliquis was prescribed for you to reduce the risk of forming blood clots that can cause a stroke if you have a medical condition called atrial fibrillation (a type of irregular heartbeat) OR to reduce the risk of a blood clots forming after orthopedic surgery.  What do You need to know about Eliquis ? Take your Eliquis TWICE DAILY - one tablet in the morning and one tablet in the evening with or without food.  It would be best to take the doses about the same time each day.  If you have difficulty swallowing the tablet whole please discuss with your pharmacist how to take the medication safely.  Take Eliquis exactly as prescribed by your doctor and DO NOT stop taking Eliquis without talking to the doctor who prescribed the medication.  Stopping may increase your risk of developing a new clot or stroke.  Refill your prescription before you run out.  After discharge, you should have regular check-up appointments with your healthcare provider that is prescribing your Eliquis.  In the future your dose may need to be changed if your kidney function or weight changes by a significant amount or as you get older.  What do you do if you miss a dose? If you miss a dose, take it as soon as you remember on the same day and resume taking twice daily.  Do not take more than one dose of ELIQUIS at the same time.  Important Safety Information A possible side effect of Eliquis is bleeding. You should call your healthcare provider right away if you experience any of the following: ? Bleeding from an injury or your nose that does not stop. ? Unusual colored urine (red or dark brown) or unusual colored stools (red or  black). ? Unusual bruising for unknown reasons. ? A serious fall or if you hit your head (even if there is no bleeding).  Some medicines may interact with Eliquis and might increase your risk of bleeding or clotting while on Eliquis. To help avoid this, consult your healthcare provider or pharmacist prior to using any new prescription or non-prescription medications, including herbals, vitamins, non-steroidal anti-inflammatory drugs (NSAIDs) and supplements.  This website has more information on Eliquis (apixaban): [email or web address].

## 2017-01-29 NOTE — Consult Note (Signed)
ANTICOAGULATION CONSULT NOTE - Initial Consult  Pharmacy Consult for apixaban Indication: atrial fibrillation  Allergies  Allergen Reactions  . Propoxyphene Other (See Comments)    Other Reaction: Severe Headache  . Advil [Ibuprofen] Other (See Comments)    Makes her heart race  . Darvocet [Propoxyphene N-Acetaminophen]   . Percocet [Oxycodone-Acetaminophen]     hallucination    Patient Measurements: Height: 5\' 7"  (170.2 cm) Weight: 181 lb 4.8 oz (82.2 kg) IBW/kg (Calculated) : 61.6 Heparin Dosing Weight:   Vital Signs: Temp: 98 F (36.7 C) (10/12 0828) Temp Source: Oral (10/12 0828) BP: 125/70 (10/12 0828) Pulse Rate: 85 (10/12 0828)  Labs:  Recent Labs  01/27/17 0536 01/27/17 1332 01/28/17 0416 01/29/17 0404  HGB 11.1*  --  10.5* 10.6*  HCT 34.0*  --  32.3* 32.2*  PLT 159  --  161 157  HEPARINUNFRC 0.68 0.52 0.50 0.42  CREATININE 1.00  --  1.01*  --     Estimated Creatinine Clearance: 49 mL/min (A) (by C-G formula based on SCr of 1.01 mg/dL (H)).   Medical History: Past Medical History:  Diagnosis Date  . Arthritis    hands  . Black head   . Bowel incontinence   . Breast cancer of upper-outer quadrant of left female breast ([place]) 2015   Left  . Carotid artery stenosis   . Carpal tunnel syndrome   . CHF (congestive heart failure) (H[facility])    Pt. states dx about 5-10 years ago  . COPD (chronic obstructive pulmonary disease) ([place])   . Debilitated   . Diabetes mellitus without complication ([place])   . Glaucoma   . History of seizures   . Hyperlipidemia   . Hypertension   . Hypotension   . Obesity   . Urinary incontinence     Medications:  Scheduled:  . anastrozole  1 mg Oral Daily  . apixaban  5 mg Oral BID  . atorvastatin  20 mg Oral q1800  . brimonidine  1 drop Both Eyes BID  . cyanocobalamin  1,000 mcg Intramuscular Q30 days  . docusate sodium  100 mg Oral BID  . dorzolamide  1 drop Both Eyes BID  . feeding supplement (ENSURE ENLIVE)  237 mL  Oral BID BM  . fluticasone  1 spray Each Nare Daily  . furosemide  20 mg Oral Daily  . insulin aspart  0-9 Units Subcutaneous TID WC  . latanoprost  1 drop Both Eyes QHS  . levothyroxine  100 mcg Oral QAC breakfast  . losartan  25 mg Oral Daily  . metoprolol succinate  100 mg Oral Daily  . mometasone-formoterol  2 puff Inhalation BID  . mupirocin ointment  1 application Topical BID  . nystatin   Topical TID  . olopatadine  1 drop Both Eyes BID  . pantoprazole (PROTONIX) IV  40 mg Intravenous Q12H  . potassium chloride  20 mEq Oral Daily    Assessment: Pt is a 82 year old female w/ a PMH of afib, CHA2DS2VASc equals at least 6. Pt currently on heparin drip for afib/ possible thromboembolism. Pharmacy consulted to transition to apixaban  Goal of Therapy:   Monitor platelets by anticoagulation protocol: Yes   Plan:  apixaban 5mg  BID  [place] Route D Lashauna Arpin, Pharm.D, BCPS Clinical Pharmacist  01/29/2017,10:06 AM

## 2017-01-30 LAB — CBC
HEMATOCRIT: 33 % — AB (ref 35.0–47.0)
Hemoglobin: 10.9 g/dL — ABNORMAL LOW (ref 12.0–16.0)
MCH: 31.8 pg (ref 26.0–34.0)
MCHC: 33.1 g/dL (ref 32.0–36.0)
MCV: 96.2 fL (ref 80.0–100.0)
Platelets: 167 10*3/uL (ref 150–440)
RBC: 3.43 MIL/uL — AB (ref 3.80–5.20)
RDW: 16 % — ABNORMAL HIGH (ref 11.5–14.5)
WBC: 2.3 10*3/uL — AB (ref 3.6–11.0)

## 2017-01-30 LAB — GLUCOSE, CAPILLARY
Glucose-Capillary: 125 mg/dL — ABNORMAL HIGH (ref 65–99)
Glucose-Capillary: 156 mg/dL — ABNORMAL HIGH (ref 65–99)
Glucose-Capillary: 147 mg/dL — ABNORMAL HIGH (ref 65–99)
Glucose-Capillary: 124 mg/dL — ABNORMAL HIGH (ref 65–99)

## 2017-01-30 NOTE — Evaluation (Signed)
Physical Therapy Evaluation Patient Details Name: Mia Padilla MRN: [id number] DOB: [birth year] Today's Date: 01/30/2017   History of Present Illness  82 y/o female past medical history of COPD: CAD, diabetes, glaucoma and CHF presents emergency department complaining of abdominal pain. This is acute on chronic in nature.  Pt amitted with NSTEMI.  Clinical Impression  Pt showed great motivation and willingness to participate with PT exam.  She did well with getting to standing but was very reliant on her 4WW and had poor posture with forward flexion.  She is able to ambulate 60+ ft but needed constant cuing for posture, cadence and to monitor vitals (O2 dropped to low 90s, HR increased to the 120s) pt needed standing rest break but again showed great determination to push herself.  Pt should be able to return home safely with the assist that is available, will benefit from HHPT to get back to her baseline.     Follow Up Recommendations Home health PT    Equipment Recommendations       Recommendations for Other Services       Precautions / Restrictions Precautions Precautions: Fall Restrictions Weight Bearing Restrictions: No      Mobility  Bed Mobility Overal bed mobility: Modified Independent             General bed mobility comments: Pt able to get to EOB from supine w/o direct assist, showed good effort  Transfers Overall transfer level: Modified independent Equipment used: 4-wheeled walker             General transfer comment: Pt is able to rise to standing w/o assist, did have a little issue with getting feet fully under her to shift weight forward and up but was able to rise w/o direct assist  Ambulation/Gait Ambulation/Gait assistance: Supervision Ambulation Distance (Feet): 60 Feet Assistive device: 4-wheeled walker       General Gait Details: Pt with forward flexed posture, b/l toe out and generally heavily reliant on the walker but reports being close  to her baseline.  Pt's HR did increase to the 120s with the effort and she needed standing rest break but ultimatley she showed ability to do in-home distances.  Stairs            Wheelchair Mobility    Modified Rankin (Stroke Patients Only)       Balance Overall balance assessment: Modified Independent                                           Pertinent Vitals/Pain Pain Assessment: No/denies pain    Home Living Family/patient expects to be discharged to:: Private residence Living Arrangements: Children Available Help at Discharge: Family   Home Access: Stairs to enter   Entrance Stairs-Number of Steps: [place]: Environmental consultant - 4 wheels;Walker - 2 wheels;Cane - single point      Prior Function Level of Independence: Independent with assistive device(s)         Comments: Pt goes to church weekly, otherwise is only out of the house for MD appointments. States she can manage ADLs well on her own.     Hand Dominance        Extremity/Trunk Assessment   Upper Extremity Assessment Upper Extremity Assessment: Overall WFL for tasks assessed;Generalized weakness (age appropriate limitations)    Lower Extremity Assessment Lower Extremity Assessment: Generalized weakness (age  appropriate limitatons)       Communication   Communication: No difficulties  Cognition Arousal/Alertness: Awake/alert Behavior During Therapy: WFL for tasks assessed/performed Overall Cognitive Status: Within Functional Limits for tasks assessed                                 General Comments: Pt very pleasant t/o the PT exam      General Comments      Exercises     Assessment/Plan    PT Assessment Patient needs continued PT services  PT Problem List Decreased strength;Decreased range of motion;Decreased activity tolerance;Decreased balance;Decreased mobility;Decreased cognition;Decreased knowledge of use of DME;Decreased safety  awareness;Pain;Cardiopulmonary status limiting activity       PT Treatment Interventions DME instruction;Gait training;Stair training;Functional mobility training;Therapeutic activities;Therapeutic exercise;Balance training;Neuromuscular re-education;Patient/family education    PT Goals (Current goals can be found in the Care Plan section)  Acute Rehab PT Goals Patient Stated Goal: go home PT Goal Formulation: With patient Time For Goal Achievement: 02/13/17 Potential to Achieve Goals: Fair    Frequency Min 2X/week   Barriers to discharge        Co-evaluation               AM-PAC PT "6 Clicks" Daily Activity  Outcome Measure Difficulty turning over in bed (including adjusting bedclothes, sheets and blankets)?: A Little Difficulty moving from lying on back to sitting on the side of the bed? : A Little Difficulty sitting down on and standing up from a chair with arms (e.g., wheelchair, bedside commode, etc,.)?: A Lot Help needed moving to and from a bed to chair (including a wheelchair)?: A Little Help needed walking in hospital room?: A Little Help needed climbing 3-5 steps with a railing? : A Lot 6 Click Score: 16    End of Session Equipment Utilized During Treatment: Gait belt Activity Tolerance: Patient limited by fatigue Patient left: with chair alarm set;with call bell/phone within reach   PT Visit Diagnosis: Muscle weakness (generalized) (M62.81);Difficulty in walking, not elsewhere classified (R26.2)    Time: 1040-1109 PT Time Calculation (min) (ACUTE ONLY): 29 min   Charges:   PT Evaluation $PT Eval Low Complexity: 1 Low PT Treatments $Gait Training: 8-22 mins   PT G Codes:   PT G-Codes **NOT FOR INPATIENT CLASS** Functional Assessment Tool Used: AM-PAC 6 Clicks Basic Mobility Functional Limitation: Mobility: Walking and moving around Mobility: Walking and Moving Around Current Status (W2637): At least 40 percent but less than 60 percent impaired,  limited or restricted Mobility: Walking and Moving Around Goal Status [phone]): At least 1 percent but less than 20 percent impaired, limited or restricted    Kreg Shropshire, DPT 01/30/2017, 2:08 PM

## 2017-01-30 NOTE — Progress Notes (Signed)
Patient ID: Mia Padilla, female   DOB: 1934-05-18, 82 y.o.   MRN: [id number]  [facility] PROGRESS NOTE  BRISHA MCCABE RDE:[id number] DOB: 1935-02-02 DOA: 01/24/2017 PCP: [name], MD  HPI/Subjective: Patient feeling better.  Still with some soreness in the abdomen.  Objective: Vitals:   01/30/17 1351 01/30/17 1703  BP: (!) 116/59 111/71  Pulse: 83 88  Resp: 16 16  Temp: 98.3 F (36.8 C) 97.6 F (36.4 C)  SpO2: 97% 99%    Filed Weights   01/27/17 0505 01/28/17 0500 01/30/17 0425  Weight: 81.9 kg (180 lb 9.6 oz) 82.2 kg (181 lb 4.8 oz) 83.7 kg (184 lb 9.6 oz)    ROS: Review of Systems  Constitutional: Negative for chills and fever.  Eyes: Negative for blurred vision.  Respiratory: Negative for cough and shortness of breath.   Cardiovascular: Negative for chest pain.  Gastrointestinal: Positive for abdominal pain. Negative for constipation, diarrhea, nausea and vomiting.  Genitourinary: Negative for dysuria.  Musculoskeletal: Negative for joint pain.  Neurological: Negative for dizziness and headaches.   Exam: Physical Exam  Constitutional: She is oriented to person, place, and time.  HENT:  Nose: No mucosal edema.  Mouth/Throat: No oropharyngeal exudate or posterior oropharyngeal edema.  Eyes: Pupils are equal, round, and reactive to light. Conjunctivae, EOM and lids are normal.  Neck: No JVD present. Carotid bruit is not present. No edema present. No thyroid mass and no thyromegaly present.  Cardiovascular: S1 normal and S2 normal.  Exam reveals no gallop.   No murmur heard. Pulses:      Dorsalis pedis pulses are 2+ on the right side, and 2+ on the left side.  Respiratory: No respiratory distress. She has no wheezes. She has no rhonchi. She has no rales.  GI: Soft. Bowel sounds are normal. There is tenderness in the epigastric area.  Musculoskeletal:       Right shoulder: She exhibits no swelling.  Lymphadenopathy:    She has no cervical adenopathy.   Neurological: She is alert and oriented to person, place, and time. No cranial nerve deficit.  Skin: Skin is warm. No rash noted. Nails show no clubbing.  Psychiatric: She has a normal mood and affect.      Data Reviewed: Basic Metabolic Panel:  Recent Labs Lab 01/24/17 0156 01/25/17 0606 01/26/17 0332 01/27/17 0536 01/28/17 0416  NA 138 138 138 138 138  K 3.4* 3.6 3.6 3.5 3.4*  CL 99* 104 105 102 103  CO2 29 26 26 27 29   GLUCOSE 155* 99 89 81 87  BUN 13 11 14 17 14   CREATININE 0.97 0.97 0.96 1.00 1.01*  CALCIUM 8.8* 8.1* 7.7* 8.1* 8.2*  MG 1.2* 1.6* 1.6*  --  1.6*   Liver Function Tests:  Recent Labs Lab 01/24/17 0156  AST 26  ALT 13*  ALKPHOS 51  BILITOT 1.1  PROT 9.4*  ALBUMIN 3.6    Recent Labs Lab 01/24/17 0156  LIPASE 20   CBC:  Recent Labs Lab 01/26/17 0332 01/27/17 0536 01/28/17 0416 01/29/17 0404 01/30/17 0443  WBC 2.7* 3.1* 2.6* 2.3* 2.3*  HGB 9.6* 11.1* 10.5* 10.6* 10.9*  HCT 29.1* 34.0* 32.3* 32.2* 33.0*  MCV 96.2 97.3 94.7 94.7 96.2  PLT 130* 159 161 157 167   Cardiac Enzymes:  Recent Labs Lab 01/24/17 0156 01/24/17 0739 01/24/17 1326 01/24/17 1957  TROPONINI 0.05* 0.04* 0.05* 0.05*   BNP (last 3 results)  Recent Labs  05/08/16 1035  BNP 207.0*  CBG:  Recent Labs Lab 01/29/17 1147 01/29/17 1748 01/29/17 2113 01/30/17 0746 01/30/17 1138  GLUCAP 181* 99 109* 125* 156*     Scheduled Meds: . anastrozole  1 mg Oral Daily  . apixaban  5 mg Oral BID  . atorvastatin  20 mg Oral q1800  . brimonidine  1 drop Both Eyes BID  . cyanocobalamin  1,000 mcg Intramuscular Q30 days  . docusate sodium  100 mg Oral BID  . dorzolamide  1 drop Both Eyes BID  . feeding supplement (ENSURE ENLIVE)  237 mL Oral BID BM  . fluticasone  1 spray Each Nare Daily  . furosemide  20 mg Oral Daily  . insulin aspart  0-9 Units Subcutaneous TID WC  . latanoprost  1 drop Both Eyes QHS  . levothyroxine  100 mcg Oral QAC breakfast  .  losartan  25 mg Oral Daily  . metoprolol succinate  100 mg Oral Daily  . mometasone-formoterol  2 puff Inhalation BID  . mupirocin ointment  1 application Topical BID  . nystatin   Topical TID  . olopatadine  1 drop Both Eyes BID  . pantoprazole (PROTONIX) IV  40 mg Intravenous Q12H  . potassium chloride  20 mEq Oral Daily    Assessment/Plan:  1. Perforated duodenal ulcer with abdominal pain.  Surgery recommended  Conservative management. IV Protonix. Advance diet to soft diet. I recommended GI consultation as outpatient for endoscopy. 2. Elevated troponin. Demand ischemia. Continue metoprolol. No anti-inflammatories at this time. 3. Possible SMA occlusion. Eliquis started 4. Hypothyroidism unspecified on Synthroid 5. Type 2 diabetes mellitus on diet control 6. Glaucoma on eyedrops 7. COPD continue inhalers 8. Paroxysmal atrial fibrillation on metoprolol. Also on Eliquis for anticoagulation 9. Acute on chronic systolic congestive heart faiure improved with diuresis. Patient on Toprol and low-dose losartan.  Blood pressure too low for spironolactone at this point.  Code Status:     Code Status Orders        Start     Ordered   01/24/17 0713  Full code  Continuous     01/24/17 0712    Code Status History    Date Active Date Inactive Code Status Order ID Comments User Context   This patient has a current code status but no historical code status.     Family Communication: spoke with family at bedside Disposition Plan: I was planning on discharging today but they do not have power at home and the patient uses a lift chair in order to get up. They will think about moving a lift chair to another family member's house. Hopefully be able to discharge  Tomorrow.  Consultants:  Surgery  Cardiology  Time spent: 30 minutes  [place], [place]

## 2017-01-31 LAB — GLUCOSE, CAPILLARY
Glucose-Capillary: 146 mg/dL — ABNORMAL HIGH (ref 65–99)
Glucose-Capillary: 173 mg/dL — ABNORMAL HIGH (ref 65–99)

## 2017-01-31 MED ORDER — FUROSEMIDE 20 MG PO TABS: 20.0000 mg | ORAL_TABLET | Freq: Every day | ORAL | 0 refills | Status: DC

## 2017-01-31 MED ORDER — PANTOPRAZOLE SODIUM 40 MG PO TBEC: 40.0000 mg | DELAYED_RELEASE_TABLET | Freq: Two times a day (BID) | ORAL | 0 refills | Status: DC

## 2017-01-31 MED ORDER — LOSARTAN POTASSIUM 25 MG PO TABS
25.0000 mg | ORAL_TABLET | Freq: Every day | ORAL | 0 refills | Status: DC
Start: 2017-02-01 — End: 2017-02-09

## 2017-01-31 MED ORDER — POTASSIUM CHLORIDE CRYS ER 20 MEQ PO TBCR: 20.0000 meq | EXTENDED_RELEASE_TABLET | Freq: Every day | ORAL | 0 refills | Status: DC

## 2017-01-31 MED ORDER — ENSURE ENLIVE PO LIQD: 237.0000 mL | Freq: Two times a day (BID) | ORAL | 0 refills | Status: AC

## 2017-01-31 MED ORDER — APIXABAN 5 MG PO TABS: 5.0000 mg | ORAL_TABLET | Freq: Two times a day (BID) | ORAL | 0 refills | Status: DC

## 2017-01-31 MED ORDER — LEVEMIR FLEXTOUCH 100 UNIT/ML ~~LOC~~ SOPN: 6.0000 [IU] | PEN_INJECTOR | Freq: Every day | SUBCUTANEOUS | 11 refills | Status: AC

## 2017-01-31 MED ORDER — ATORVASTATIN CALCIUM 20 MG PO TABS: 20.0000 mg | ORAL_TABLET | Freq: Every day | ORAL | 0 refills | Status: DC

## 2017-01-31 NOTE — Care Management Note (Addendum)
Case Management Note  Patient Details  Name: Mia Padilla MRN: [id number] Date of Birth: 18-Dec-1934  Subjective/Objective:   Discussed discharge planning with [name] who has no preference of home health providers. This Probation officer called Brenton Grills at [employer] with a referral for hh=PT, OT, Aide, RN. [name]'s daughter was provided with an Eliquis coupon earlier this week by the CM.                Action/Plan:   Expected Discharge Date:  01/31/17               Expected Discharge Plan:  [place]  In-House Referral:  NA  Discharge planning Services  CM Consult  Post Acute Care Choice:  Home Health Choice offered to:  Patient  DME Arranged:  N/A DME Agency:  NA  HH Arranged:  RN, OT, PT, Nurse's Aide [place] Agency:   ([place])  Status of Service:  Completed, signed off  If discussed at [place] of Stay Meetings, dates discussed:    Additional Comments:  Talmadge Ganas A, RN 01/31/2017, 1:35 PM

## 2017-01-31 NOTE — Discharge Summary (Signed)
[facility] [place] NAME: Mia Padilla    MR#:  [id number]  DATE OF BIRTH:  13-Jun-1944  DATE OF ADMISSION:  01/24/2017 ADMITTING PHYSICIAN: Harrie Foreman, MD  DATE OF DISCHARGE: 01/31/2017  PRIMARY CARE PHYSICIAN: [name], MD    ADMISSION DIAGNOSIS:  Hypomagnesemia [E83.42] Lower abdominal pain [R10.30] NSTEMI (non-ST elevated myocardial infarction) ([place]) [I21.4] Cardiac arrhythmia, unspecified cardiac arrhythmia type [I49.9]  DISCHARGE DIAGNOSIS:  Active Problems:   NSTEMI (non-ST elevated myocardial infarction) (HCC)   Duodenal ulcer perforation (HCC)   Elevated troponin   Acute on chronic diastolic heart failure (HCC)   Hypomagnesemia   Hypokalemia   Mesenteric artery stenosis ([place])   SECONDARY DIAGNOSIS:   Past Medical History:  Diagnosis Date  . Arthritis    hands  . Black head   . Bowel incontinence   . Breast cancer of upper-outer quadrant of left female breast ([place]) 2015   Left  . Carotid artery stenosis   . Carpal tunnel syndrome   . CHF (congestive heart failure) (HCC)    Pt. states dx about 5-10 years ago  . COPD (chronic obstructive pulmonary disease) ([place])   . Debilitated   . Diabetes mellitus without complication ([place])   . Glaucoma   . History of seizures   . Hyperlipidemia   . Hypertension   . Hypotension   . Obesity   . Urinary incontinence     HOSPITAL COURSE:   1. Perforated duodenal ulcer with abdominal pain. Patient was seen in consultation by general surgery and they recommended conservative management. The patient was on IV Protonix while here during the entire hospitalization and will be converted over to oral Protonix twice a day upon discharge home. The patient was nothing by mouth for a long period of time and then advanced to clear liquid diet and is now tolerating soft diet. I recommended soft diet for a few weeks. General surgery recommended outpatient endoscopy. She was given  the name of a gastroenterologist dr. Vicente Males. 2. Elevated troponin. Demand ischemia. Continue metoprolol. No aspirin at this time with perforated ulcer.  Cardiology wanted to do an outpatient stress test. 3. Paroxysmal atrial fibrillation on metoprolol. Eliquis started for anticoagulation 4. Possible SMA occlusion. Eliquis started 5. Hypothyroidism unspecified on Synthroid 6. Type 2 diabetes mellitus. The patient was on sliding scale while here in the hospital. Can go back on low-dose Lantus 6 units daily at night. Can titrate up if sugars start running higher. 7. History of COPD continue inhalers 8. Acute on chronic systolic congestive heart failure improved with diuresis. Patient on Toprol, low-dose losartan, low-dose Lasix. Blood pressure too low for spironolactone at this point. Follow up in CHF clinic and with cardiology as outpatient.  DISCHARGE CONDITIONS:   fair  CONSULTS OBTAINED:  Treatment Team:  Jules Husbands, MD Evaristo Bury, MD  DRUG ALLERGIES:   Allergies  Allergen Reactions  . Propoxyphene Other (See Comments)    Other Reaction: Severe Headache  . Advil [Ibuprofen] Other (See Comments)    Makes her heart race  . Darvocet [Propoxyphene N-Acetaminophen]   . Percocet [Oxycodone-Acetaminophen]     hallucination    DISCHARGE MEDICATIONS:   Current Discharge Medication List    START taking these medications   Details  apixaban (ELIQUIS) 5 MG TABS tablet Take 1 tablet (5 mg total) by mouth 2 (two) times daily. Qty: 60 tablet, Refills: 0    atorvastatin (LIPITOR) 20 MG tablet Take 1  tablet (20 mg total) by mouth daily at 6 PM. Qty: 30 tablet, Refills: 0    feeding supplement, ENSURE ENLIVE, (ENSURE ENLIVE) LIQD Take 237 mLs by mouth 2 (two) times daily between meals. Qty: 60 Bottle, Refills: 0    losartan (COZAAR) 25 MG tablet Take 1 tablet (25 mg total) by mouth daily. Qty: 30 tablet, Refills: 0    pantoprazole (PROTONIX) 40 MG tablet Take 1 tablet (40 mg  total) by mouth 2 (two) times daily. Qty: 60 tablet, Refills: 0    potassium chloride SA (K-DUR,KLOR-CON) 20 MEQ tablet Take 1 tablet (20 mEq total) by mouth daily. Qty: 30 tablet, Refills: 0      CONTINUE these medications which have CHANGED   Details  furosemide (LASIX) 20 MG tablet Take 1 tablet (20 mg total) by mouth daily. Qty: 30 tablet, Refills: 0    LEVEMIR FLEXTOUCH 100 UNIT/ML Pen Inject 6 Units into the skin daily at 10 pm. INJECT 6 UNITS PER DAY Qty: 15 mL, Refills: 11      CONTINUE these medications which have NOT CHANGED   Details  acetaminophen (TYLENOL) 500 MG tablet Take 500 mg by mouth every 6 (six) hours as needed.    albuterol (PROVENTIL HFA;VENTOLIN HFA) 108 (90 Base) MCG/ACT inhaler Inhale 2 puffs into the lungs every 6 (six) hours as needed for wheezing or shortness of breath. Qty: 1 Inhaler, Refills: 0    anastrozole (ARIMIDEX) 1 MG tablet Take 1 tablet (1 mg total) by mouth daily. Qty: 90 tablet, Refills: 4   Associated Diagnoses: Malignant neoplasm of left breast in female, estrogen receptor positive, unspecified site of breast (HCC)    brimonidine (ALPHAGAN P) 0.1 % SOLN 2 (two) times daily.     cyanocobalamin (,VITAMIN B-12,) 1000 MCG/ML injection Inject 1 mL (1,000 mcg total) into the muscle every 30 (thirty) days. Qty: 1 mL, Refills: 12    dorzolamide (TRUSOPT) 2 % ophthalmic solution 1 drop 2 (two) times daily.     Fluticasone-Salmeterol (ADVAIR) 250-50 MCG/DOSE AEPB Inhale 1 puff into the lungs every 12 (twelve) hours. Qty: 180 each, Refills: 4    latanoprost (XALATAN) 0.005 % ophthalmic solution Place 1 drop into both eyes at bedtime.    levothyroxine (SYNTHROID, LEVOTHROID) 100 MCG tablet Take 1 tablet (100 mcg total) by mouth daily. Qty: 90 tablet, Refills: 4   Associated Diagnoses: Hypothyroidism, unspecified type    meclizine (ANTIVERT) 25 MG tablet Take 1 tablet (25 mg total) by mouth 3 (three) times daily as needed. Qty: 90 tablet,  Refills: 1    metoprolol succinate (TOPROL-XL) 100 MG 24 hr tablet Take 1 tablet (100 mg total) by mouth daily. Qty: 90 tablet, Refills: 4   Associated Diagnoses: Essential hypertension    mometasone (ELOCON) 0.1 % cream Apply 1 application topically daily. Apply 1-2 times daily for no longer than 2 weeks at a time Qty: 50 g, Refills: 3    mupirocin ointment (BACTROBAN) 2 % Apply 1 application topically 2 (two) times daily. Do not use day in and day out Qty: 22 g, Refills: 2    nystatin (MYCOSTATIN/NYSTOP) powder APPY TO THE AFFECTED AREA 4 TIMES DAILY Qty: 45 g, Refills: 1    Olopatadine HCl (PATADAY) 0.2 % SOLN Apply 1 drop to eye daily.     solifenacin (VESICARE) 10 MG tablet Take 1 tablet (10 mg total) by mouth daily. Qty: 90 tablet, Refills: 2    Vitamin D, Ergocalciferol, (DRISDOL) 50000 units CAPS capsule TAKE 1 CAPSULE  BY MOUTH EVERY 7 DAYS. Qty: 12 capsule, Refills: 0   Associated Diagnoses: Vitamin D deficiency    blood glucose meter kit and supplies Diagnosis:E11.9 Test glucose twice daily Qty: 1 each, Refills: 12   Associated Diagnoses: Diabetes mellitus without complication (HCC)    fluticasone (FLONASE) 50 MCG/ACT nasal spray Place 2 sprays into both nostrils daily. Qty: 16 g, Refills: 12    glucose blood test strip Use as instructed Qty: 100 each, Refills: 12      STOP taking these medications     amoxicillin-clavulanate (AUGMENTIN) 500-125 MG tablet      clopidogrel (PLAVIX) 75 MG tablet      digoxin (LANOXIN) 0.125 MG tablet      lisinopril (PRINIVIL,ZESTRIL) 10 MG tablet      metFORMIN (GLUCOPHAGE) 500 MG tablet      benzonatate (TESSALON PERLES) 100 MG capsule      famotidine (PEPCID) 20 MG tablet          DISCHARGE INSTRUCTIONS:   Follow-up PMD one week Follow-up gastroenterology 2-3 weeks Follow-up CHF clinic  If you experience worsening of your admission symptoms, develop shortness of breath, life threatening emergency, suicidal or  homicidal thoughts you must seek medical attention immediately by calling 911 or calling your MD immediately  if symptoms less severe.  You Must read complete instructions/literature along with all the possible adverse reactions/side effects for all the Medicines you take and that have been prescribed to you. Take any new Medicines after you have completely understood and accept all the possible adverse reactions/side effects.   Please note  You were cared for by a hospitalist during your hospital stay. If you have any questions about your discharge medications or the care you received while you were in the hospital after you are discharged, you can call the unit and asked to speak with the hospitalist on call if the hospitalist that took care of you is not available. Once you are discharged, your primary care physician will handle any further medical issues. Please note that NO REFILLS for any discharge medications will be authorized once you are discharged, as it is imperative that you return to your primary care physician (or establish a relationship with a primary care physician if you do not have one) for your aftercare needs so that they can reassess your need for medications and monitor your lab values.    Today   CHIEF COMPLAINT:   Chief Complaint  Patient presents with  . Abdominal Pain  . Emesis    HISTORY OF PRESENT ILLNESS:  Safaa Stingley  is a 82 y.o. female presented with abdominal pain and vomiting   VITAL SIGNS:  Blood pressure 119/73, pulse 85, temperature (!) 97.5 F (36.4 C), temperature source Oral, resp. rate 17, height 5' 7" (1.702 m), weight 84.8 kg (187 lb), SpO2 96 %.    PHYSICAL EXAMINATION:  GENERAL:  82 y.o.-year-old patient lying in the bed with no acute distress.  EYES: Pupils equal, round, reactive to light and accommodation. No scleral icterus. Extraocular muscles intact.  HEENT: Head atraumatic, normocephalic. Oropharynx and nasopharynx clear.  NECK:   Supple, no jugular venous distention. No thyroid enlargement, no tenderness.  LUNGS: decreased breath sounds bilaterally, no wheezing, rales,rhonchi or crepitation. No use of accessory muscles of respiration.  CARDIOVASCULAR: S1, S2 normal. No murmurs, rubs, or gallops.  ABDOMEN: Soft, non-tender, non-distended. Bowel sounds present. No organomegaly or mass.  EXTREMITIES: 3+edema, no cyanosis, or clubbing.  NEUROLOGIC: Cranial nerves II through XII  are intact. Muscle strength 5/5 in all extremities. Sensation intact. Gait not checked.  PSYCHIATRIC: The patient is alert and oriented x 3.  SKIN: No obvious rash, lesion, or ulcer.   DATA REVIEW:   CBC  Recent Labs Lab 01/30/17 0443  WBC 2.3*  HGB 10.9*  HCT 33.0*  PLT 167    Chemistries   Recent Labs Lab 01/28/17 0416  NA 138  K 3.4*  CL 103  CO2 29  GLUCOSE 87  BUN 14  CREATININE 1.01*  CALCIUM 8.2*  MG 1.6*    Cardiac Enzymes  Recent Labs Lab 01/24/17 1957  TROPONINI 0.05*    Microbiology Results  Results for orders placed or performed in visit on 11/28/15  Microscopic Examination     Status: Abnormal   Collection Time: 11/28/15  3:45 PM  Result Value Ref Range Status   WBC, UA None seen 0 - 5 /hpf Final   RBC, UA 3-10 (A) 0 - 2 /hpf Final   Epithelial Cells (non renal) 0-10 0 - 10 /hpf Final   Bacteria, UA Few None seen/Few Final      Management plans discussed with the patient, family and they are in agreement.  CODE STATUS:     Code Status Orders        Start     Ordered   01/24/17 0713  Full code  Continuous     01/24/17 0712    Code Status History    Date Active Date Inactive Code Status Order ID Comments User Context   This patient has a current code status but no historical code status.      TOTAL TIME TAKING CARE OF THIS PATIENT: 35 minutes.    Loletha Grayer M.D on 01/31/2017 at 2:35 PM  Between 7am to 6pm - Pager - [phone]  After 6pm go to [email or web address] - password  [facility]  [phone]  CC: Primary care physician; [name], MD

## 2017-01-31 NOTE — Progress Notes (Signed)
Pt discharged home. At this time she does not have power in her home, so she will be staying with her grand-daughter, as she needs electricity to power her lift chair. Discharge instructions, education, and prescriptions were provided and reviewed with the patient and her grand-daughter. IV's and heart monitor removed. The pt was taken to the car via wheelchair.

## 2017-02-01 ENCOUNTER — Telehealth: Payer: Self-pay

## 2017-02-01 DIAGNOSIS — I214 Non-ST elevation (NSTEMI) myocardial infarction: Secondary | ICD-10-CM | POA: Diagnosis not present

## 2017-02-01 DIAGNOSIS — C50412 Malignant neoplasm of upper-outer quadrant of left female breast: Secondary | ICD-10-CM | POA: Diagnosis not present

## 2017-02-01 DIAGNOSIS — I4891 Unspecified atrial fibrillation: Secondary | ICD-10-CM | POA: Diagnosis not present

## 2017-02-01 DIAGNOSIS — E119 Type 2 diabetes mellitus without complications: Secondary | ICD-10-CM | POA: Diagnosis not present

## 2017-02-01 DIAGNOSIS — K265 Chronic or unspecified duodenal ulcer with perforation: Secondary | ICD-10-CM | POA: Diagnosis not present

## 2017-02-01 DIAGNOSIS — J449 Chronic obstructive pulmonary disease, unspecified: Secondary | ICD-10-CM | POA: Diagnosis not present

## 2017-02-01 DIAGNOSIS — I951 Orthostatic hypotension: Secondary | ICD-10-CM | POA: Diagnosis not present

## 2017-02-01 DIAGNOSIS — D510 Vitamin B12 deficiency anemia due to intrinsic factor deficiency: Secondary | ICD-10-CM | POA: Diagnosis not present

## 2017-02-01 DIAGNOSIS — K551 Chronic vascular disorders of intestine: Secondary | ICD-10-CM | POA: Diagnosis not present

## 2017-02-01 DIAGNOSIS — I5033 Acute on chronic diastolic (congestive) heart failure: Secondary | ICD-10-CM | POA: Diagnosis not present

## 2017-02-01 NOTE — Telephone Encounter (Signed)
Called for patients TOC call since discharged from the hospital, spoke with her granddaughter, patient is with her home health nurse at the time. She will call back at a later time. Gave her alternate number for today as the office is closed.   Call back- [phone]

## 2017-02-01 NOTE — Telephone Encounter (Signed)
I have made the 2nd attempt to contact the patient or family member in charge, in order to follow up from recently being discharged from the hospital. I left a message on voicemail but I will make another attempt at a different time.  

## 2017-02-01 NOTE — Telephone Encounter (Signed)
Initial appointment with the CHF clinic made for 02/09/2017 at 11:00 am. Thank you for the referral

## 2017-02-02 DIAGNOSIS — C50412 Malignant neoplasm of upper-outer quadrant of left female breast: Secondary | ICD-10-CM | POA: Diagnosis not present

## 2017-02-02 DIAGNOSIS — K551 Chronic vascular disorders of intestine: Secondary | ICD-10-CM | POA: Diagnosis not present

## 2017-02-02 DIAGNOSIS — I214 Non-ST elevation (NSTEMI) myocardial infarction: Secondary | ICD-10-CM | POA: Diagnosis not present

## 2017-02-02 DIAGNOSIS — K265 Chronic or unspecified duodenal ulcer with perforation: Secondary | ICD-10-CM | POA: Diagnosis not present

## 2017-02-02 DIAGNOSIS — D510 Vitamin B12 deficiency anemia due to intrinsic factor deficiency: Secondary | ICD-10-CM | POA: Diagnosis not present

## 2017-02-02 DIAGNOSIS — J449 Chronic obstructive pulmonary disease, unspecified: Secondary | ICD-10-CM | POA: Diagnosis not present

## 2017-02-02 DIAGNOSIS — I5033 Acute on chronic diastolic (congestive) heart failure: Secondary | ICD-10-CM | POA: Diagnosis not present

## 2017-02-02 DIAGNOSIS — I4891 Unspecified atrial fibrillation: Secondary | ICD-10-CM | POA: Diagnosis not present

## 2017-02-02 DIAGNOSIS — I951 Orthostatic hypotension: Secondary | ICD-10-CM | POA: Diagnosis not present

## 2017-02-02 DIAGNOSIS — E119 Type 2 diabetes mellitus without complications: Secondary | ICD-10-CM | POA: Diagnosis not present

## 2017-02-04 DIAGNOSIS — C50412 Malignant neoplasm of upper-outer quadrant of left female breast: Secondary | ICD-10-CM | POA: Diagnosis not present

## 2017-02-04 DIAGNOSIS — E119 Type 2 diabetes mellitus without complications: Secondary | ICD-10-CM | POA: Diagnosis not present

## 2017-02-04 DIAGNOSIS — D510 Vitamin B12 deficiency anemia due to intrinsic factor deficiency: Secondary | ICD-10-CM | POA: Diagnosis not present

## 2017-02-04 DIAGNOSIS — I4891 Unspecified atrial fibrillation: Secondary | ICD-10-CM | POA: Diagnosis not present

## 2017-02-04 DIAGNOSIS — I951 Orthostatic hypotension: Secondary | ICD-10-CM | POA: Diagnosis not present

## 2017-02-04 DIAGNOSIS — K265 Chronic or unspecified duodenal ulcer with perforation: Secondary | ICD-10-CM | POA: Diagnosis not present

## 2017-02-04 DIAGNOSIS — K551 Chronic vascular disorders of intestine: Secondary | ICD-10-CM | POA: Diagnosis not present

## 2017-02-04 DIAGNOSIS — J449 Chronic obstructive pulmonary disease, unspecified: Secondary | ICD-10-CM | POA: Diagnosis not present

## 2017-02-04 DIAGNOSIS — I214 Non-ST elevation (NSTEMI) myocardial infarction: Secondary | ICD-10-CM | POA: Diagnosis not present

## 2017-02-04 DIAGNOSIS — I5033 Acute on chronic diastolic (congestive) heart failure: Secondary | ICD-10-CM | POA: Diagnosis not present

## 2017-02-08 DIAGNOSIS — D510 Vitamin B12 deficiency anemia due to intrinsic factor deficiency: Secondary | ICD-10-CM | POA: Diagnosis not present

## 2017-02-08 DIAGNOSIS — E119 Type 2 diabetes mellitus without complications: Secondary | ICD-10-CM | POA: Diagnosis not present

## 2017-02-08 DIAGNOSIS — I951 Orthostatic hypotension: Secondary | ICD-10-CM | POA: Diagnosis not present

## 2017-02-08 DIAGNOSIS — I4891 Unspecified atrial fibrillation: Secondary | ICD-10-CM | POA: Diagnosis not present

## 2017-02-09 ENCOUNTER — Encounter: Payer: Self-pay | Admitting: Family Medicine

## 2017-02-09 ENCOUNTER — Ambulatory Visit: Payer: Medicare Other | Attending: Family | Admitting: Family

## 2017-02-09 ENCOUNTER — Encounter: Payer: Self-pay | Admitting: Family

## 2017-02-09 ENCOUNTER — Ambulatory Visit (INDEPENDENT_AMBULATORY_CARE_PROVIDER_SITE_OTHER): Payer: Medicare Other | Admitting: Family Medicine

## 2017-02-09 ENCOUNTER — Telehealth: Payer: Self-pay

## 2017-02-09 VITALS — BP 129/75 | HR 88 | Resp 20 | Ht 63.0 in | Wt 185.4 lb

## 2017-02-09 VITALS — BP 119/71 | HR 84 | Temp 97.5°F | Wt 185.0 lb

## 2017-02-09 DIAGNOSIS — Z87891 Personal history of nicotine dependence: Secondary | ICD-10-CM | POA: Diagnosis not present

## 2017-02-09 DIAGNOSIS — Z853 Personal history of malignant neoplasm of breast: Secondary | ICD-10-CM | POA: Diagnosis not present

## 2017-02-09 DIAGNOSIS — H409 Unspecified glaucoma: Secondary | ICD-10-CM | POA: Diagnosis not present

## 2017-02-09 DIAGNOSIS — I110 Hypertensive heart disease with heart failure: Secondary | ICD-10-CM | POA: Diagnosis not present

## 2017-02-09 DIAGNOSIS — G5600 Carpal tunnel syndrome, unspecified upper limb: Secondary | ICD-10-CM | POA: Insufficient documentation

## 2017-02-09 DIAGNOSIS — I214 Non-ST elevation (NSTEMI) myocardial infarction: Secondary | ICD-10-CM | POA: Diagnosis not present

## 2017-02-09 DIAGNOSIS — K265 Chronic or unspecified duodenal ulcer with perforation: Secondary | ICD-10-CM

## 2017-02-09 DIAGNOSIS — E876 Hypokalemia: Secondary | ICD-10-CM | POA: Diagnosis not present

## 2017-02-09 DIAGNOSIS — E119 Type 2 diabetes mellitus without complications: Secondary | ICD-10-CM | POA: Diagnosis not present

## 2017-02-09 DIAGNOSIS — I890 Lymphedema, not elsewhere classified: Secondary | ICD-10-CM

## 2017-02-09 DIAGNOSIS — R0602 Shortness of breath: Secondary | ICD-10-CM | POA: Diagnosis present

## 2017-02-09 DIAGNOSIS — I5032 Chronic diastolic (congestive) heart failure: Secondary | ICD-10-CM | POA: Diagnosis not present

## 2017-02-09 DIAGNOSIS — I4891 Unspecified atrial fibrillation: Secondary | ICD-10-CM | POA: Insufficient documentation

## 2017-02-09 DIAGNOSIS — I480 Paroxysmal atrial fibrillation: Secondary | ICD-10-CM

## 2017-02-09 DIAGNOSIS — I5033 Acute on chronic diastolic (congestive) heart failure: Secondary | ICD-10-CM | POA: Diagnosis not present

## 2017-02-09 DIAGNOSIS — R109 Unspecified abdominal pain: Secondary | ICD-10-CM

## 2017-02-09 DIAGNOSIS — Z7901 Long term (current) use of anticoagulants: Secondary | ICD-10-CM | POA: Insufficient documentation

## 2017-02-09 DIAGNOSIS — Z23 Encounter for immunization: Secondary | ICD-10-CM

## 2017-02-09 DIAGNOSIS — K551 Chronic vascular disorders of intestine: Secondary | ICD-10-CM

## 2017-02-09 DIAGNOSIS — Z794 Long term (current) use of insulin: Secondary | ICD-10-CM | POA: Insufficient documentation

## 2017-02-09 DIAGNOSIS — Z885 Allergy status to narcotic agent status: Secondary | ICD-10-CM | POA: Insufficient documentation

## 2017-02-09 DIAGNOSIS — E785 Hyperlipidemia, unspecified: Secondary | ICD-10-CM | POA: Diagnosis not present

## 2017-02-09 DIAGNOSIS — I10 Essential (primary) hypertension: Secondary | ICD-10-CM

## 2017-02-09 DIAGNOSIS — J449 Chronic obstructive pulmonary disease, unspecified: Secondary | ICD-10-CM | POA: Diagnosis not present

## 2017-02-09 DIAGNOSIS — Z79899 Other long term (current) drug therapy: Secondary | ICD-10-CM | POA: Diagnosis not present

## 2017-02-09 DIAGNOSIS — Z7951 Long term (current) use of inhaled steroids: Secondary | ICD-10-CM | POA: Insufficient documentation

## 2017-02-09 DIAGNOSIS — E1151 Type 2 diabetes mellitus with diabetic peripheral angiopathy without gangrene: Secondary | ICD-10-CM

## 2017-02-09 LAB — GLUCOSE, CAPILLARY: GLUCOSE-CAPILLARY: 147 mg/dL — AB (ref 65–99)

## 2017-02-09 MED ORDER — MOMETASONE FURO-FORMOTEROL FUM 200-5 MCG/ACT IN AERO: 2.0000 | INHALATION_SPRAY | Freq: Two times a day (BID) | RESPIRATORY_TRACT | 12 refills | Status: DC

## 2017-02-09 MED ORDER — LOSARTAN POTASSIUM 25 MG PO TABS: 25.0000 mg | ORAL_TABLET | Freq: Every day | ORAL | 1 refills | Status: DC

## 2017-02-09 MED ORDER — ATORVASTATIN CALCIUM 20 MG PO TABS: 20.0000 mg | ORAL_TABLET | Freq: Every day | ORAL | 1 refills | Status: DC

## 2017-02-09 MED ORDER — APIXABAN 5 MG PO TABS: 5.0000 mg | ORAL_TABLET | Freq: Two times a day (BID) | ORAL | 1 refills | Status: DC

## 2017-02-09 MED ORDER — FUROSEMIDE 20 MG PO TABS: 20.0000 mg | ORAL_TABLET | Freq: Every day | ORAL | 1 refills | Status: DC

## 2017-02-09 MED ORDER — PANTOPRAZOLE SODIUM 40 MG PO TBEC: 40.0000 mg | DELAYED_RELEASE_TABLET | Freq: Two times a day (BID) | ORAL | 1 refills | Status: DC

## 2017-02-09 MED ORDER — MOMETASONE FURO-FORMOTEROL FUM 200-5 MCG/ACT IN AERO: 2.0000 | INHALATION_SPRAY | Freq: Two times a day (BID) | RESPIRATORY_TRACT | 12 refills | Status: AC

## 2017-02-09 MED ORDER — ALBUTEROL SULFATE HFA 108 (90 BASE) MCG/ACT IN AERS: 2.0000 | INHALATION_SPRAY | Freq: Four times a day (QID) | RESPIRATORY_TRACT | 0 refills | Status: DC | PRN

## 2017-02-09 NOTE — Assessment & Plan Note (Signed)
Due for stress test- ordered today. Follow up with CHF clinic and cardiology as needed. No chest pain at this time.

## 2017-02-09 NOTE — Telephone Encounter (Signed)
Called Dr.Gallon's office.  Patient was recently diagnosed with CHF while admitted to the hospital.   Called to find out if Dr.Gallon wants to see patient sooner since she has [facility] also wants patient to have an outpatient stress test, do we need to order this or will he?   Spoke with front desk, Dr.Gallon sends his patients to the heart failure clinic, if stress test is needed they will schedule it.   Spoke with Tina's office, they do not see where she has ordered the stress test.  Dr.Johnson, please order the stress test.

## 2017-02-09 NOTE — Assessment & Plan Note (Signed)
Rechecking levels today. Will replace as needed. Call with any concerns.

## 2017-02-09 NOTE — Assessment & Plan Note (Signed)
Only a little bit of belly pain. Continue protonix and soft diet until speech evaluates her- getting home health out. Referral to GI made and appointment scheduled. Seeing them in 2 weeks. Call with any concerns.

## 2017-02-09 NOTE — Patient Instructions (Addendum)
Stomach doctor- Dr. Bonna Gains February 22, 2017 [facility] [address] [place], [place] [address]  Main: [phone]    Influenza (Flu) Vaccine (Inactivated or Recombinant): What You Need to Know 1. Why get vaccinated? Influenza ("flu") is a contagious disease that spreads around the [place] every year, usually between October and May. Flu is caused by influenza viruses, and is spread mainly by coughing, sneezing, and close contact. Anyone can get flu. Flu strikes suddenly and can last several days. Symptoms vary by age, but can include:  fever/chills  sore throat  muscle aches  fatigue  cough  headache  runny or stuffy nose  Flu can also lead to pneumonia and blood infections, and cause diarrhea and seizures in children. If you have a medical condition, such as heart or lung disease, flu can make it worse. Flu is more dangerous for some people. Infants and young children, people 25 years of age and older, pregnant women, and people with certain health conditions or a weakened immune system are at greatest risk. Each year thousands of people in the [place] die from flu, and many more are hospitalized. Flu vaccine can:  keep you from getting flu,  make flu less severe if you do get it, and  keep you from spreading flu to your family and other people. 2. Inactivated and recombinant flu vaccines A dose of flu vaccine is recommended every flu season. Children 6 months through 35 years of age may need two doses during the same flu season. Everyone else needs only one dose each flu season. Some inactivated flu vaccines contain a very small amount of a mercury-based preservative called thimerosal. Studies have not shown thimerosal in vaccines to be harmful, but flu vaccines that do not contain thimerosal are available. There is no live flu virus in flu shots. They cannot cause the flu. There are many flu viruses, and they  are always changing. Each year a new flu vaccine is made to protect against three or four viruses that are likely to cause disease in the upcoming flu season. But even when the vaccine doesn't exactly match these viruses, it may still provide some protection. Flu vaccine cannot prevent:  flu that is caused by a virus not covered by the vaccine, or  illnesses that look like flu but are not.  It takes about 2 weeks for protection to develop after vaccination, and protection lasts through the flu season. 3. Some people should not get this vaccine Tell the person who is giving you the vaccine:  If you have any severe, life-threatening allergies. If you ever had a life-threatening allergic reaction after a dose of flu vaccine, or have a severe allergy to any part of this vaccine, you may be advised not to get vaccinated. Most, but not all, types of flu vaccine contain a small amount of egg protein.  If you ever had Guillain-Barr Syndrome (also called GBS). Some people with a history of GBS should not get this vaccine. This should be discussed with your doctor.  If you are not feeling well. It is usually okay to get flu vaccine when you have a mild illness, but you might be asked to come back when you feel better.  4. Risks of a vaccine reaction With any medicine, including vaccines, there is a chance of reactions. These are usually mild and go away on their own, but serious reactions are also possible. Most people who get a flu shot do  not have any problems with it. Minor problems following a flu shot include:  soreness, redness, or swelling where the shot was given  hoarseness  sore, red or itchy eyes  cough  fever  aches  headache  itching  fatigue  If these problems occur, they usually begin soon after the shot and last 1 or 2 days. More serious problems following a flu shot can include the following:  There may be a small increased risk of Guillain-Barre Syndrome (GBS)  after inactivated flu vaccine. This risk has been estimated at 1 or 2 additional cases per million people vaccinated. This is much lower than the risk of severe complications from flu, which can be prevented by flu vaccine.  Young children who get the flu shot along with pneumococcal vaccine (PCV13) and/or DTaP vaccine at the same time might be slightly more likely to have a seizure caused by fever. Ask your doctor for more information. Tell your doctor if a child who is getting flu vaccine has ever had a seizure.  Problems that could happen after any injected vaccine:  People sometimes faint after a medical procedure, including vaccination. Sitting or lying down for about 15 minutes can help prevent fainting, and injuries caused by a fall. Tell your doctor if you feel dizzy, or have vision changes or ringing in the ears.  Some people get severe pain in the shoulder and have difficulty moving the arm where a shot was given. This happens very rarely.  Any medication can cause a severe allergic reaction. Such reactions from a vaccine are very rare, estimated at about 1 in a million doses, and would happen within a few minutes to a few hours after the vaccination. As with any medicine, there is a very remote chance of a vaccine causing a serious injury or death. The safety of vaccines is always being monitored. For more information, visit: http://www.aguilar.org/ 5. What if there is a serious reaction? What should I look for? Look for anything that concerns you, such as signs of a severe allergic reaction, very high fever, or unusual behavior. Signs of a severe allergic reaction can include hives, swelling of the face and throat, difficulty breathing, a fast heartbeat, dizziness, and weakness. These would start a few minutes to a few hours after the vaccination. What should I do?  If you think it is a severe allergic reaction or other emergency that can't wait, call 9-1-1 and get the person to the  nearest hospital. Otherwise, call your doctor.  Reactions should be reported to the Vaccine Adverse Event Reporting System (VAERS). Your doctor should file this report, or you can do it yourself through the VAERS web site at www.vaers.SamedayNews.es, or by calling 367-583-8773. ? VAERS does not give medical advice. 6. The National Vaccine Injury Compensation Program The [employer] Vaccine Injury Compensation Program (VICP) is a federal program that was created to compensate people who may have been injured by certain vaccines. Persons who believe they may have been injured by a vaccine can learn about the program and about filing a claim by calling 445-302-4736 or visiting the [place] website at GoldCloset.com.ee. There is a time limit to file a claim for compensation. 7. How can I learn more?  Ask your healthcare provider. He or she can give you the vaccine package insert or suggest other sources of information.  Call your local or state health department.  Contact the Centers for Disease Control and Prevention (CDC): ? Call 647-642-0863 (1-800-CDC-INFO) or ? Visit CDC's website  at https://gibson.com/ Vaccine Information Statement, Inactivated Influenza Vaccine (11/24/2013) This information is not intended to replace advice given to you by your health care provider. Make sure you discuss any questions you have with your health care provider. Document Released: 01/29/2006 Document Revised: 12/26/2015 Document Reviewed: 12/26/2015 Elsevier Interactive Patient Education  2017 [place] Meal Plan A soft-food meal plan includes foods that are safe and easy to swallow. This meal plan typically is used:  If you are having trouble chewing or swallowing foods.  As a transition meal plan after only having had liquid meals for a long period.  What do I need to know about the soft-food meal plan? A soft-food meal plan includes tender foods that are soft and easy to chew and  swallow. In most cases, bite-sized pieces of food are easier to swallow. A bite-sized piece is about  inch or smaller. Foods in this plan do not need to be ground or pureed. Foods that are very hard, crunchy, or sticky should be avoided. Also, breads, cereals, yogurts, and desserts with nuts, seeds, or fruits should be avoided. What foods can I eat? Grains Rice and wild rice. Moist bread, dressing, pasta, and noodles. Well-moistened dry or cooked cereals, such as farina (cooked wheat cereal), oatmeal, or grits. Biscuits, breads, muffins, pancakes, and waffles that have been well moistened. Vegetables Shredded lettuce. Cooked, tender vegetables, including potatoes without skins. Vegetable juices. Broths or creamed soups made with vegetables that are not stringy or chewy. Strained tomatoes (without seeds). Fruits Canned or well-cooked fruits. Soft (ripe), peeled fresh fruits, such as peaches, nectarines, kiwi, cantaloupe, honeydew melon, and watermelon (without seeds). Soft berries with small seeds, such as strawberries. Fruit juices (without pulp). Meats and Other Protein Sources Moist, tender, lean beef. Mutton. Lamb. Veal. Chicken. [place]. Liver. Ham. Fish without bones. Eggs. Dairy Milk, milk drinks, and cream. Plain cream cheese and cottage cheese. Plain yogurt. Sweets/Desserts Flavored gelatin desserts. Custard. Plain ice cream, frozen yogurt, sherbet, milk shakes, and malts. Plain cakes and cookies. Plain hard candy. Other Butter, margarine (without trans fat), and cooking oils. Mayonnaise. Cream sauces. Mild spices, salt, and sugar. Syrup, molasses, honey, and jelly. The items listed above may not be a complete list of recommended foods or beverages. Contact your dietitian for more options. What foods are not recommended? Grains Dry bread, toast, crackers that have not been moistened. Coarse or dry cereals, such as bran, granola, and shredded wheat. Tough or chewy crusty breads, such as  [place] bread or baguettes. Vegetables Corn. Raw vegetables except shredded lettuce. Cooked vegetables that are tough or stringy. Tough, crisp, fried potatoes and potato skins. Fruits Fresh fruits with skins or seeds or both, such as apples, pears, or grapes. Stringy, high-pulp fruits, such as papaya, pineapple, coconut, or mango. Fruit leather, fruit roll-ups, and all dried fruits. Meats and Other Protein Sources Sausages and hot dogs. Meats with gristle. Fish with bones. Nuts, seeds, and chunky peanut or other nut butters. Sweets/Desserts Cakes or cookies that are very dry or chewy. The items listed above may not be a complete list of foods and beverages to avoid. Contact your dietitian for more information. This information is not intended to replace advice given to you by your health care provider. Make sure you discuss any questions you have with your health care provider. Document Released: 07/14/2007 Document Revised: 09/12/2015 Document Reviewed: 03/03/2013 Elsevier Interactive Patient Education  2017 [employer].

## 2017-02-09 NOTE — Telephone Encounter (Signed)
Order in. Thank you for that.   She has [facility] out right now for PT- can we see if we can extend them a little bit and also get swallowing in for her to reevaluate her diet? Thanks!

## 2017-02-09 NOTE — Progress Notes (Signed)
BP 119/71 (BP Location: Right Arm, Patient Position: Sitting, Cuff Size: Large)   Pulse 84   Temp (!) 97.5 F (36.4 C)   Wt 185 lb (83.9 kg)   SpO2 99%   BMI 32.77 kg/m    Subjective:    Patient ID: Mia Padilla, female    DOB: March 21, 1935, 82 y.o.   MRN: [id number]  HPI: Mia Padilla is a 82 y.o. female  Chief Complaint  Patient presents with  . Hospitalization Follow-up   HOSPITAL FOLLOW UP Time since discharge: 9 days ago Hospital/facility: [facility] Diagnosis: hypomagnesemia, NSTEMI, arrhythmia, CHF, perforated duodenal ulcer, hypokalemia, mesenteric artery stenosis  HOSPITAL COURSE:   "1. Perforated duodenal ulcer with abdominal pain. Patient was seen in consultation by general surgery and they recommended conservative management. The patient was on IV Protonix while here during the entire hospitalization and will be converted over to oral Protonix twice a day upon discharge home. The patient was nothing by mouth for a long period of time and then advanced to clear liquid diet and is now tolerating soft diet. I recommended soft diet for a few weeks. General surgery recommended outpatient endoscopy. She was given the name of a gastroenterologist dr. Vicente Males. 2. Elevated troponin. Demand ischemia. Continue metoprolol. No aspirin at this time with perforated ulcer.  Cardiology wanted to do an outpatient stress test. 3. Paroxysmal atrial fibrillation on metoprolol. Eliquis started for anticoagulation 4. Possible SMA occlusion. Eliquis started 5. Hypothyroidism unspecified on Synthroid 6. Type 2 diabetes mellitus. The patient was on sliding scale while here in the hospital. Can go back on low-dose Lantus 6 units daily at night. Can titrate up if sugars start running higher. 7. History of COPD continue inhalers 8. Acute on chronic systolic congestive heart failure improved with diuresis. Patient on Toprol, low-dose losartan, low-dose Lasix. Blood pressure too low for spironolactone at this  point. Follow up in CHF clinic and with cardiology as outpatient.""  Consultants: Cardiology, General surgery, vascular surgery New medications: eliquis, atorvastatin, ensure, losartan, protonix, Potassium Discharge instructions:  EGD with Dr. Vicente Males, outpatient stress test, CHF clinic, follow up here Status: better- still pretty tired  Started on elequis for ?SMA occlusion- vascular did not recommend any follow up at that time.  Started on atorvastatin- needs recheck on that. Needs an outpatient stress test. Saw CHF clinic today, but no scheduled follow up with cardiology set up yet. Called to cardiology- they weren't planning on doing stress test. We will order for her today. Needs EGD for perforated ulcer- appointment set up today. Having PT at home to help with deconditioning- there from [facility]  Relevant past medical, surgical, family and social history reviewed and updated as indicated. Interim medical history since our last visit reviewed. Allergies and medications reviewed and updated.  Review of Systems  Constitutional: Positive for fatigue. Negative for activity change, appetite change, chills, diaphoresis, fever and unexpected weight change.  Respiratory: Negative.   Cardiovascular: Negative.   Gastrointestinal: Positive for abdominal pain (comes and goes). Negative for abdominal distention, anal bleeding, blood in stool, constipation, diarrhea, nausea, rectal pain and vomiting.  Musculoskeletal: Positive for arthralgias. Negative for back pain, gait problem, joint swelling, myalgias, neck pain and neck stiffness.  Psychiatric/Behavioral: Negative.     Per HPI unless specifically indicated above     Objective:    BP 119/71 (BP Location: Right Arm, Patient Position: Sitting, Cuff Size: Large)   Pulse 84   Temp (!) 97.5 F (36.4 C)  Wt 185 lb (83.9 kg)   SpO2 99%   BMI 32.77 kg/m   Wt Readings from Last 3 Encounters:  02/09/17 185 lb (83.9 kg)  02/09/17 185  lb 6 oz (84.1 kg)  01/31/17 187 lb (84.8 kg)    Physical Exam  Constitutional: She is oriented to person, place, and time. She appears well-developed and well-nourished. No distress.  HENT:  Head: Normocephalic and atraumatic.  Right Ear: Hearing normal.  Left Ear: Hearing normal.  Nose: Nose normal.  Eyes: Conjunctivae and lids are normal. Right eye exhibits no discharge. Left eye exhibits no discharge. No scleral icterus.  Cardiovascular: Normal rate, regular rhythm, normal heart sounds and intact distal pulses.  Exam reveals no gallop and no friction rub.   No murmur heard. Pulmonary/Chest: Effort normal and breath sounds normal. No respiratory distress. She has no wheezes. She has no rales. She exhibits no tenderness.  Musculoskeletal: Normal range of motion.  Neurological: She is alert and oriented to person, place, and time.  Skin: Skin is warm, dry and intact. No rash noted. She is not diaphoretic. No erythema. There is pallor.  Psychiatric: She has a normal mood and affect. Her speech is normal and behavior is normal. Judgment and thought content normal. Cognition and memory are normal.  Nursing note and vitals reviewed.   Results for orders placed or performed in visit on 02/09/17  Glucose, capillary  Result Value Ref Range   Glucose-Capillary 147 (H) 65 - 99 mg/dL      Assessment & Plan:   Problem List Items Addressed This Visit      Cardiovascular and Mediastinum   NSTEMI (non-ST elevated myocardial infarction) ([place]) - Primary    Due for stress test- ordered today. Follow up with CHF clinic and cardiology as needed. No chest pain at this time.       Relevant Medications   apixaban (ELIQUIS) 5 MG TABS tablet   atorvastatin (LIPITOR) 20 MG tablet   furosemide (LASIX) 20 MG tablet   losartan (COZAAR) 25 MG tablet   Other Relevant Orders   CBC with Differential/Platelet   Lipid Panel w/o Chol/HDL Ratio   NM Myocar Multi W/Spect W/Wall Motion / EF   Acute on chronic  diastolic heart failure (HCC)    Due for stress test- ordered today. Follow up with CHF clinic and cardiology as needed. No chest pain at this time.       Relevant Medications   apixaban (ELIQUIS) 5 MG TABS tablet   atorvastatin (LIPITOR) 20 MG tablet   furosemide (LASIX) 20 MG tablet   losartan (COZAAR) 25 MG tablet   Other Relevant Orders   CBC with Differential/Platelet   Lipid Panel w/o Chol/HDL Ratio   NM Myocar Multi W/Spect W/Wall Motion / EF   Mesenteric artery stenosis (HCC)    On eliquis, checking CBC today. Started on atorvastatin- checking levels today. Await results. No need for vascular procedures per hospital notes. She does have a follow up with Dr. Lucky Cowboy in a couple of months. Continue to monitor.        Relevant Medications   apixaban (ELIQUIS) 5 MG TABS tablet   atorvastatin (LIPITOR) 20 MG tablet   furosemide (LASIX) 20 MG tablet   losartan (COZAAR) 25 MG tablet   Other Relevant Orders   CBC with Differential/Platelet   Lipid Panel w/o Chol/HDL Ratio     Digestive   Duodenal ulcer perforation (HCC)    Only a little bit of belly pain. Continue protonix  and soft diet until speech evaluates her- getting home health out. Referral to GI made and appointment scheduled. Seeing them in 2 weeks. Call with any concerns.       Relevant Orders   Ambulatory referral to Gastroenterology   CBC with Differential/Platelet     Other   Hypomagnesemia    Rechecking levels today. Will replace as needed. Call with any concerns.       Relevant Orders   Magnesium   Hypokalemia    Rechecking levels today. Will replace as needed. Call with any concerns.       Relevant Orders   Comprehensive metabolic panel    Other Visit Diagnoses    Left flank pain       Continue protonix, seeing GI. Seeing urology again in a couple of weeks. Call with any concerns.    Immunization due       High dose flu given today.   Relevant Orders   Flu vaccine HIGH DOSE PF (Fluzone High dose)  (Completed)       Follow up plan: Return As scheduled , for with [facility].

## 2017-02-09 NOTE — Telephone Encounter (Signed)
Verbal order given to [facility],

## 2017-02-09 NOTE — Progress Notes (Signed)
Patient ID: Mia Padilla, female    DOB: 1934/09/10, 82 y.o.   MRN: [id number]  HPI  Mia Padilla is an 82 y/o female with a history of breast cancer, COPD, diabetes, glaucoma, HTN, hyperlipidemia, DVT, atrial fibrillation, remote tobacco use and chronic heart failure.   Echo report from 01/24/17 reviewed and shows an EF of 40% along with mild MR and a PA pressure of 50-55 mm Hg.  Admitted 01/24/17 due to perforated duodenal ulcer. IV protonix was given with transition to oral protonix. Surgical and cardiology consults were obtained. Elevated troponin thought to be due to demand ischemia. Discharged home after 7 days. Was in the ED 11/22/16 due to abdominal pain. She was treated and released.   She presents today for her initial visit with a chief complaint of moderate shortness of breath upon minimal exertion. She has associated fatigue, blurry vision, chest tightness, dizziness, difficulty sleeping and edema. She denies any chest pain.   Past Medical History:  Diagnosis Date  . Arthritis    hands  . Black head   . Bowel incontinence   . Breast cancer of upper-outer quadrant of left female breast ([place]) 2015   Left  . Carotid artery stenosis   . Carpal tunnel syndrome   . CHF (congestive heart failure) ([facility])    Pt. states dx about 5-10 years ago  . COPD (chronic obstructive pulmonary disease) ([place])   . Debilitated   . Diabetes mellitus without complication ([place])   . Glaucoma   . History of seizures   . Hyperlipidemia   . Hypertension   . Hypotension   . Obesity   . Urinary incontinence    Past Surgical History:  Procedure Laterality Date  . BOWEL RESECTION  2005  . BREAST SURGERY  08/03/13   left mastectomy  . CARPAL TUNNEL RELEASE Right 30 years   . MASTECTOMY     left   . PARTIAL HYSTERECTOMY    . TONSILLECTOMY    . UMBILICAL HERNIA REPAIR     Family History  Problem Relation Age of Onset  . Cervical cancer Mother   . Diabetes Mother   . Glaucoma Mother   . Cervical  cancer Daughter   . Hypertension Daughter   . Hypertension Father   . Hypertension Sister   . Hypertension Brother   . Hypertension Son   . Diabetes Brother   . HIV Brother   . Breast cancer Neg Hx    Social History  Substance Use Topics  . Smoking status: Former Smoker    Quit date: 04/20/1974  . Smokeless tobacco: Never Used     Comment: Quit 1976  . Alcohol use No   Allergies  Allergen Reactions  . Propoxyphene Other (See Comments)    Other Reaction: Severe Headache  . Advil [Ibuprofen] Other (See Comments)    Makes her heart race  . Darvocet [Propoxyphene N-Acetaminophen]   . Percocet [Oxycodone-Acetaminophen]     hallucination   Prior to Admission medications   Medication Sig Start Date End Date Taking? Authorizing Provider  acetaminophen (TYLENOL) 500 MG tablet Take 500 mg by mouth every 6 (six) hours as needed.   Yes [provider]  albuterol (PROVENTIL HFA;VENTOLIN HFA) 108 (90 Base) MCG/ACT inhaler Inhale 2 puffs into the lungs every 6 (six) hours as needed for wheezing or shortness of breath. 05/08/16  Yes Lisa Roca, MD  anastrozole (ARIMIDEX) 1 MG tablet Take 1 tablet (1 mg total) by mouth daily. 06/08/16  Yes  Kieth Brightly, MD  apixaban (ELIQUIS) 5 MG TABS tablet Take 1 tablet (5 mg total) by mouth 2 (two) times daily. 01/31/17  Yes Wieting, Richard, MD  atorvastatin (LIPITOR) 20 MG tablet Take 1 tablet (20 mg total) by mouth daily at 6 PM. 01/31/17  Yes Wieting, Richard, MD  blood glucose meter kit and supplies Diagnosis:E11.9 Test glucose twice daily 11/21/14  Yes Johnson, Megan P, DO  brimonidine (ALPHAGAN P) 0.1 % SOLN 2 (two) times daily.    Yes [provider]  cyanocobalamin (,VITAMIN B-12,) 1000 MCG/ML injection Inject 1 mL (1,000 mcg total) into the muscle every 30 (thirty) days. 07/16/16  Yes Particia Nearing, PA-C  dorzolamide (TRUSOPT) 2 % ophthalmic solution 1 drop 2 (two) times daily.    Yes [provider]   feeding supplement, ENSURE ENLIVE, (ENSURE ENLIVE) LIQD Take 237 mLs by mouth 2 (two) times daily between meals. 01/31/17  Yes Wieting, Richard, MD  fluticasone (FLONASE) 50 MCG/ACT nasal spray Place 2 sprays into both nostrils daily. 10/14/15  Yes Particia Nearing, PA-C  Fluticasone-Salmeterol (ADVAIR) 250-50 MCG/DOSE AEPB Inhale 1 puff into the lungs every 12 (twelve) hours. 08/18/16  Yes Crissman, Redge Gainer, MD  furosemide (LASIX) 20 MG tablet Take 1 tablet (20 mg total) by mouth daily. 01/31/17 01/31/18 Yes Wieting, Richard, MD  glucose blood test strip Use as instructed 11/05/14  Yes Crissman, Redge Gainer, MD  latanoprost (XALATAN) 0.005 % ophthalmic solution Place 1 drop into both eyes at bedtime.   Yes [provider]  LEVEMIR FLEXTOUCH 100 UNIT/ML Pen Inject 6 Units into the skin daily at 10 pm. INJECT 6 UNITS PER DAY 01/31/17  Yes Wieting, Richard, MD  levothyroxine (SYNTHROID, LEVOTHROID) 100 MCG tablet Take 1 tablet (100 mcg total) by mouth daily. 11/19/16  Yes Crissman, Redge Gainer, MD  losartan (COZAAR) 25 MG tablet Take 1 tablet (25 mg total) by mouth daily. 02/01/17  Yes Wieting, Richard, MD  meclizine (ANTIVERT) 25 MG tablet Take 1 tablet (25 mg total) by mouth 3 (three) times daily as needed. 05/18/16  Yes Crissman, Redge Gainer, MD  metoprolol succinate (TOPROL-XL) 100 MG 24 hr tablet Take 1 tablet (100 mg total) by mouth daily. 08/18/16  Yes Crissman, Redge Gainer, MD  mometasone (ELOCON) 0.1 % cream Apply 1 application topically daily. Apply 1-2 times daily for no longer than 2 weeks at a time 11/24/16  Yes Maurice March, Salley Hews, PA-C  mometasone-formoterol [facility]) 200-5 MCG/ACT AERO Inhale 2 puffs into the lungs 2 (two) times daily.   Yes [provider]  mupirocin ointment (BACTROBAN) 2 % Apply 1 application topically 2 (two) times daily. Do not use day in and day out 11/19/16  Yes Crissman, Redge Gainer, MD  nystatin (MYCOSTATIN/NYSTOP) powder APPY TO THE AFFECTED AREA 4 TIMES DAILY 02/17/16   Yes Johnson, Megan P, DO  Olopatadine HCl (PATADAY) 0.2 % SOLN Apply 1 drop to eye daily.    Yes [provider]  pantoprazole (PROTONIX) 40 MG tablet Take 1 tablet (40 mg total) by mouth 2 (two) times daily. 01/31/17 01/31/18 Yes Wieting, Richard, MD  potassium chloride SA (K-DUR,KLOR-CON) 20 MEQ tablet Take 1 tablet (20 mEq total) by mouth daily. 02/01/17  Yes Wieting, Richard, MD  solifenacin (VESICARE) 10 MG tablet Take 1 tablet (10 mg total) by mouth daily. 08/18/16  Yes Crissman, Redge Gainer, MD  Vitamin D, Ergocalciferol, (DRISDOL) 50000 units CAPS capsule TAKE 1 CAPSULE BY MOUTH EVERY 7 DAYS. Patient not taking: Reported on  02/09/2017 02/17/16   Mia Roys, DO    Review of Systems  Constitutional: Positive for fatigue. Negative for appetite change.  HENT: Positive for rhinorrhea. Negative for congestion and sore throat.   Eyes: Positive for visual disturbance (blurry vision).  Respiratory: Positive for chest tightness (soreness) and shortness of breath. Negative for cough.   Cardiovascular: Positive for leg swelling. Negative for chest pain and palpitations.  Gastrointestinal: Positive for abdominal pain (left side of abdomen). Negative for abdominal distention.  Endocrine: Negative.   Genitourinary: Negative.   Musculoskeletal: Positive for arthralgias (left hip). Negative for back pain.  Skin: Negative.   Allergic/Immunologic: Negative.   Neurological: Positive for dizziness. Negative for light-headedness.  Hematological: Negative for adenopathy. Does not bruise/bleed easily.  Psychiatric/Behavioral: Positive for sleep disturbance. Negative for dysphoric mood. The patient is not nervous/anxious.    Vitals:   02/09/17 1115  BP: 129/75  Pulse: 88  Resp: 20  SpO2: 100%  Weight: 185 lb 6 oz (84.1 kg)  Height: '5\' 3"'$  (1.6 m)   Wt Readings from Last 3 Encounters:  02/09/17 185 lb (83.9 kg)  02/09/17 185 lb 6 oz (84.1 kg)  01/31/17 187 lb (84.8 kg)   Lab Results   Component Value Date   CREATININE 0.77 02/09/2017   CREATININE 1.01 (H) 01/28/2017   CREATININE 1.00 01/27/2017    Physical Exam  Constitutional: She is oriented to person, place, and time. She appears well-developed and well-nourished.  HENT:  Head: Normocephalic and atraumatic.  Neck: Normal range of motion. Neck supple. No JVD present.  Cardiovascular: Normal rate.  An irregular rhythm present.  Pulmonary/Chest: Effort normal. She has no wheezes. She has no rales.  Abdominal: Soft. She exhibits no distension. There is no tenderness.  Musculoskeletal: She exhibits edema (2+ pitting edema in bilateral lower legs). She exhibits no tenderness.  Neurological: She is alert and oriented to person, place, and time.  Skin: Skin is warm and dry.  Psychiatric: She has a normal mood and affect. Her behavior is normal. Thought content normal.  Nursing note and vitals reviewed.  Assessment & Plan:  1: Chronic heart failure with preserved ejection fraction- - NYHA class III - mildly fluid overloaded today - not weighing daily as she doesn't have any scales. Set of scales was given to her and she was instructed to weigh daily and call for an overnight weight gain of >2 pounds or a weekly weight gain of >5 pounds - not adding salt and says that she lives with her son/daughter-in-law and they don't cook with salt. Discussed the importance of reading food labels so that she can keep daily sodium intake to '2000mg'$ . Written dietary information was given to her as well.  - saw cardiologist Rockey Situ) 11/10/16  2: HTN- - BP looks good today - last saw PCP Orene Desanctis) 11/24/16 - BMP from 01/28/17 reviewed and shows sodium 138, potassium 3.4 and GFR 51  3: Atrial fibrillaion- - currently rate controlled at this time - continues on apixaban & metoprolol  4: Diabetes- - glucose in clinic was 147 - continue levemir - does see nephrologist Mia Padilla)  5: Lymphedema- - does elevate her legs - says that the  TED hose are too tight on her legs; encouraged her to try wearing diabetic support socks to see if that helps - should edema continue, could consider lymphapress compression boots  Medication list was reviewed.  Return here in 1 month or sooner for any questions/problems before then.

## 2017-02-09 NOTE — Patient Instructions (Signed)
Begin weighing daily and call for an overnight weight gain of > 2 pounds or a weekly weight gain of >5 pounds. 

## 2017-02-09 NOTE — Assessment & Plan Note (Signed)
On eliquis, checking CBC today. Started on atorvastatin- checking levels today. Await results. No need for vascular procedures per hospital notes. She does have a follow up with Dr. Lucky Cowboy in a couple of months. Continue to monitor.

## 2017-02-10 ENCOUNTER — Telehealth: Payer: Self-pay | Admitting: Family Medicine

## 2017-02-10 ENCOUNTER — Telehealth: Payer: Self-pay | Admitting: *Deleted

## 2017-02-10 ENCOUNTER — Telehealth: Payer: Self-pay

## 2017-02-10 DIAGNOSIS — I951 Orthostatic hypotension: Secondary | ICD-10-CM | POA: Diagnosis not present

## 2017-02-10 DIAGNOSIS — E119 Type 2 diabetes mellitus without complications: Secondary | ICD-10-CM | POA: Diagnosis not present

## 2017-02-10 DIAGNOSIS — D510 Vitamin B12 deficiency anemia due to intrinsic factor deficiency: Secondary | ICD-10-CM | POA: Diagnosis not present

## 2017-02-10 DIAGNOSIS — I4891 Unspecified atrial fibrillation: Secondary | ICD-10-CM | POA: Diagnosis not present

## 2017-02-10 LAB — CBC WITH DIFFERENTIAL/PLATELET
BASOS ABS: 0 10*3/uL (ref 0.0–0.2)
Basos: 0 %
EOS (ABSOLUTE): 0.1 10*3/uL (ref 0.0–0.4)
Eos: 2 %
Hematocrit: 34.2 % (ref 34.0–46.6)
Hemoglobin: 10.6 g/dL — ABNORMAL LOW (ref 11.1–15.9)
IMMATURE GRANS (ABS): 0 10*3/uL (ref 0.0–0.1)
Immature Granulocytes: 0 %
LYMPHS: 18 %
Lymphocytes Absolute: 0.6 10*3/uL — ABNORMAL LOW (ref 0.7–3.1)
MCH: 30.3 pg (ref 26.6–33.0)
MCHC: 31 g/dL — ABNORMAL LOW (ref 31.5–35.7)
MCV: 98 fL — ABNORMAL HIGH (ref 79–97)
MONOS ABS: 0.2 10*3/uL (ref 0.1–0.9)
Monocytes: 7 %
NEUTROS ABS: 2.5 10*3/uL (ref 1.4–7.0)
NEUTROS PCT: 73 %
PLATELETS: 240 10*3/uL (ref 150–379)
RBC: 3.5 x10E6/uL — ABNORMAL LOW (ref 3.77–5.28)
RDW: 16.3 % — AB (ref 12.3–15.4)
WBC: 3.4 10*3/uL (ref 3.4–10.8)

## 2017-02-10 LAB — LIPID PANEL W/O CHOL/HDL RATIO
Cholesterol, Total: 94 mg/dL — ABNORMAL LOW (ref 100–199)
HDL: 37 mg/dL — AB (ref >39)
LDL Calculated: 38 mg/dL (ref 0–99)
Triglycerides: 97 mg/dL (ref 0–149)
VLDL Cholesterol Cal: 19 mg/dL (ref 5–40)

## 2017-02-10 LAB — COMPREHENSIVE METABOLIC PANEL
ALBUMIN: 3.7 g/dL (ref 3.5–4.7)
ALT: 10 IU/L (ref 0–32)
AST: 14 IU/L (ref 0–40)
Albumin/Globulin Ratio: 0.8 — ABNORMAL LOW (ref 1.2–2.2)
Alkaline Phosphatase: 47 IU/L (ref 39–117)
BUN / CREAT RATIO: 17 (ref 12–28)
BUN: 13 mg/dL (ref 8–27)
Bilirubin Total: 0.7 mg/dL (ref 0.0–1.2)
CALCIUM: 9.2 mg/dL (ref 8.7–10.3)
CHLORIDE: 99 mmol/L (ref 96–106)
CO2: 25 mmol/L (ref 20–29)
CREATININE: 0.77 mg/dL (ref 0.57–1.00)
GFR, EST AFRICAN AMERICAN: 84 mL/min/{1.73_m2} (ref >59)
GFR, EST NON AFRICAN AMERICAN: 73 mL/min/{1.73_m2} (ref >59)
GLOBULIN, TOTAL: 4.9 g/dL — AB (ref 1.5–4.5)
GLUCOSE: 162 mg/dL — AB (ref 65–99)
POTASSIUM: 4.2 mmol/L (ref 3.5–5.2)
Sodium: 137 mmol/L (ref 134–144)
TOTAL PROTEIN: 8.6 g/dL — AB (ref 6.0–8.5)

## 2017-02-10 LAB — MAGNESIUM: MAGNESIUM: 1.5 mg/dL — AB (ref 1.6–2.3)

## 2017-02-10 NOTE — Telephone Encounter (Signed)
Copied from [place]. Topic: Quick Communication - See Telephone Encounter >> Feb 10, 2017  8:48 AM Aurelio Brash B wrote: CRM for notification. See Telephone encounter for:  02/10/17. Pt wants to speak with Dr Wynetta Emery concerning [facility],  hoarseness in voice, and question about ensure

## 2017-02-10 NOTE — Telephone Encounter (Signed)
Please send proair prescription to patients pharmacy.  Ventolin is not covered

## 2017-02-10 NOTE — Telephone Encounter (Signed)
Copied from [place]. Topic: Quick Communication - See Telephone Encounter >> Feb 10, 2017  8:48 AM Aurelio Brash B wrote: CRM for notification. See Telephone encounter for:  02/10/17.

## 2017-02-10 NOTE — Telephone Encounter (Signed)
Verbal order given  

## 2017-02-10 NOTE — Telephone Encounter (Signed)
Patient notified that labs are stable. Still a bit anemic, magnesium a bit low. Otherwise normal.

## 2017-02-10 NOTE — Telephone Encounter (Signed)
Spoke with patient, she wanted to know if [facility] was coming to her home, patient informed that they will contact her and come to her home.

## 2017-02-10 NOTE — Telephone Encounter (Signed)
Pharmacy notified.

## 2017-02-10 NOTE — Telephone Encounter (Signed)
Copied from [place]. Topic: Quick Communication - See Telephone Encounter >> Feb 10, 2017  9:55 AM Ether Griffins B wrote: CRM for notification. See Telephone encounter for:  02/10/17.  Rowe Clack OT with [facility] has done eval and would like additional visits approval for ADL asking for total of 4 visit. [phone]

## 2017-02-10 NOTE — Telephone Encounter (Signed)
Please tell them to fill whatever is covered. I did not call in a brand name.

## 2017-02-11 DIAGNOSIS — I4891 Unspecified atrial fibrillation: Secondary | ICD-10-CM | POA: Diagnosis not present

## 2017-02-11 DIAGNOSIS — D510 Vitamin B12 deficiency anemia due to intrinsic factor deficiency: Secondary | ICD-10-CM | POA: Diagnosis not present

## 2017-02-11 DIAGNOSIS — I951 Orthostatic hypotension: Secondary | ICD-10-CM | POA: Diagnosis not present

## 2017-02-11 DIAGNOSIS — E119 Type 2 diabetes mellitus without complications: Secondary | ICD-10-CM | POA: Diagnosis not present

## 2017-02-12 ENCOUNTER — Telehealth: Payer: Self-pay | Admitting: Family Medicine

## 2017-02-12 DIAGNOSIS — I4891 Unspecified atrial fibrillation: Secondary | ICD-10-CM | POA: Diagnosis not present

## 2017-02-12 DIAGNOSIS — E119 Type 2 diabetes mellitus without complications: Secondary | ICD-10-CM | POA: Diagnosis not present

## 2017-02-12 DIAGNOSIS — I951 Orthostatic hypotension: Secondary | ICD-10-CM | POA: Diagnosis not present

## 2017-02-12 DIAGNOSIS — D510 Vitamin B12 deficiency anemia due to intrinsic factor deficiency: Secondary | ICD-10-CM | POA: Diagnosis not present

## 2017-02-12 NOTE — Telephone Encounter (Signed)
Verbal Orders given for 2 OT visits next week.

## 2017-02-12 NOTE — Telephone Encounter (Signed)
Copied from [place] [id number]. Topic: Quick Communication - See Telephone Encounter >> Feb 12, 2017  9:25 AM Ether Griffins B wrote: CRM for notification. See Telephone encounter for:  02/12/17. PT CANCELED ONE OT VISIT THIS WEEK NANCY WOULD LIKE OT APPROVAL FOR 2 VISITS NEXT WEEK [phone] [facility] [place])

## 2017-02-16 ENCOUNTER — Telehealth: Payer: Self-pay

## 2017-02-16 ENCOUNTER — Ambulatory Visit: Payer: Self-pay

## 2017-02-16 ENCOUNTER — Telehealth: Payer: Self-pay | Admitting: Family Medicine

## 2017-02-16 DIAGNOSIS — D510 Vitamin B12 deficiency anemia due to intrinsic factor deficiency: Secondary | ICD-10-CM | POA: Diagnosis not present

## 2017-02-16 DIAGNOSIS — E119 Type 2 diabetes mellitus without complications: Secondary | ICD-10-CM | POA: Diagnosis not present

## 2017-02-16 DIAGNOSIS — I951 Orthostatic hypotension: Secondary | ICD-10-CM | POA: Diagnosis not present

## 2017-02-16 DIAGNOSIS — I4891 Unspecified atrial fibrillation: Secondary | ICD-10-CM | POA: Diagnosis not present

## 2017-02-16 NOTE — Telephone Encounter (Signed)
Verbal order given for extra visit this week. Mia Padilla verbalized understanding.

## 2017-02-16 NOTE — Telephone Encounter (Signed)
Copied from [place] #[id number]. Topic: General - Other >> Feb 16, 2017  8:17 AM Aurelio Brash B wrote: Reason for CRM:  Rowe Clack  [phone] Asking for  Order for one more visit this week.

## 2017-02-16 NOTE — Telephone Encounter (Signed)
Discussed pt's c/o constipation and her inquiry of taking Dulcolax.  Hx of perforated duodenal ulcer.  Stated she was put on soft diet, when she was discharged from hospital about 2 wks. ago. Reviewed her bowel pattern.  (see assess. questions)   Advised to increase fluid intake, walk with walker in her home, at least 4 x / day, and suggested she can take a stool softener over the counter. Also, advised may use Prune juice, as a natural laxative.  Discouraged use of a stimulant laxative such as Dulcolax, at this time.  Also, advised that normally with constipation, high fiber would be recommended, however, with her recent hx of perforated duodenal ulcer, may not want to increase fiber in diet yet.  Encouraged to try the above recommendations, and to call office if any worsening of symptoms. Verb. Understanding. Pt. Has appt. with PCP on 11/15.         Reason for Disposition . Constipation is a chronic symptom (recurrent or ongoing AND present > 4 weeks)  Answer Assessment - Initial Assessment Questions 1. STOOL PATTERN OR FREQUENCY: "How often do you pass bowel movements (BMs)?"  (Normal range: tid to q 3 days)  "When was the last BM passed?"       Sometimes I go every other day. 2. STRAINING: "Do you have to strain to have a BM?"      Yes every once in awhile 3. RECTAL PAIN: "Does your rectum hurt when the stool comes out?" If so, ask: "Do you have hemorrhoids? How bad is the pain?"  (Scale 1-10; or mild, moderate, severe)     no 4. STOOL COMPOSITION: "Are the stools hard?"      About medium; not hard or soft 5. BLOOD ON STOOLS: "Has there been any blood on the toilet tissue or on the surface of the BM?" If so, ask: "When was the last time?"      No bleeding 6. CHRONIC CONSTIPATION: "Is this a new problem for you?"  If no, ask: How long have you had this problem?" (days, weeks, months)      Before going to hospital, I had a knot in my stomach and was oozing loose stool    7. CHANGES IN DIET: "Have  there been any recent changes in your diet?"      Discharged from hospital on a soft diet. 8. MEDICATIONS: "Have you been taking any new medications?"     no 9. LAXATIVES: "Have you been using any laxatives or enemas?"  If yes, ask "What, how often, and when was the last time?"     No; but stated took Dulcolax in the hospital 10. CAUSE: "What do you think is causing the constipation?"        unknown 11. OTHER SYMPTOMS: "Do you have any other symptoms?" (e.g., abdominal pain, fever, vomiting)       Not bloated, denies abdominal pain, denies nausea or vomiting, or fever 12. PREGNANCY: "Is there any chance you are pregnant?" "When was your last menstrual period?"       no  Protocols used: CONSTIPATION-A-AH

## 2017-02-16 NOTE — Telephone Encounter (Signed)
See Triage note 

## 2017-02-16 NOTE — Telephone Encounter (Signed)
Attempted to call pt. to discuss request for Dulcolax.  Unable to talk with pt. due to phone malfunction.

## 2017-02-16 NOTE — Telephone Encounter (Signed)
Copied from [place] #[id number]. Topic: Quick Communication - See Telephone Encounter >> Feb 16, 2017 10:33 AM Ether Griffins B wrote: CRM for notification. See Telephone encounter for:  02/16/17. Pt. Would like to know if dulcolax rx for this or if she can purchase otc

## 2017-02-17 DIAGNOSIS — I4891 Unspecified atrial fibrillation: Secondary | ICD-10-CM | POA: Diagnosis not present

## 2017-02-17 DIAGNOSIS — D510 Vitamin B12 deficiency anemia due to intrinsic factor deficiency: Secondary | ICD-10-CM | POA: Diagnosis not present

## 2017-02-17 DIAGNOSIS — I951 Orthostatic hypotension: Secondary | ICD-10-CM | POA: Diagnosis not present

## 2017-02-17 DIAGNOSIS — E119 Type 2 diabetes mellitus without complications: Secondary | ICD-10-CM | POA: Diagnosis not present

## 2017-02-18 DIAGNOSIS — I951 Orthostatic hypotension: Secondary | ICD-10-CM | POA: Diagnosis not present

## 2017-02-18 DIAGNOSIS — J449 Chronic obstructive pulmonary disease, unspecified: Secondary | ICD-10-CM | POA: Diagnosis not present

## 2017-02-18 DIAGNOSIS — D510 Vitamin B12 deficiency anemia due to intrinsic factor deficiency: Secondary | ICD-10-CM | POA: Diagnosis not present

## 2017-02-18 DIAGNOSIS — I4891 Unspecified atrial fibrillation: Secondary | ICD-10-CM | POA: Diagnosis not present

## 2017-02-18 DIAGNOSIS — I214 Non-ST elevation (NSTEMI) myocardial infarction: Secondary | ICD-10-CM | POA: Diagnosis not present

## 2017-02-18 DIAGNOSIS — K551 Chronic vascular disorders of intestine: Secondary | ICD-10-CM | POA: Diagnosis not present

## 2017-02-18 DIAGNOSIS — K265 Chronic or unspecified duodenal ulcer with perforation: Secondary | ICD-10-CM | POA: Diagnosis not present

## 2017-02-18 DIAGNOSIS — E119 Type 2 diabetes mellitus without complications: Secondary | ICD-10-CM | POA: Diagnosis not present

## 2017-02-18 DIAGNOSIS — I5033 Acute on chronic diastolic (congestive) heart failure: Secondary | ICD-10-CM | POA: Diagnosis not present

## 2017-02-18 DIAGNOSIS — C50412 Malignant neoplasm of upper-outer quadrant of left female breast: Secondary | ICD-10-CM | POA: Diagnosis not present

## 2017-02-19 ENCOUNTER — Encounter
Admission: RE | Admit: 2017-02-19 | Discharge: 2017-02-19 | Disposition: A | Discharge status: Home / Self Care | Payer: Medicare Other | Source: Ambulatory Visit | Attending: Family Medicine | Admitting: Family Medicine

## 2017-02-19 ENCOUNTER — Telehealth: Payer: Self-pay | Admitting: Family Medicine

## 2017-02-19 DIAGNOSIS — I214 Non-ST elevation (NSTEMI) myocardial infarction: Secondary | ICD-10-CM | POA: Insufficient documentation

## 2017-02-19 DIAGNOSIS — I5033 Acute on chronic diastolic (congestive) heart failure: Secondary | ICD-10-CM

## 2017-02-19 LAB — NM MYOCAR MULTI W/SPECT W/WALL MOTION / EF
CHL CUP MPHR: 140 {beats}/min
CHL CUP NUCLEAR SDS: 2
CHL CUP NUCLEAR SRS: 11
CHL CUP NUCLEAR SSS: 12
CHL CUP RESTING HR STRESS: 88 {beats}/min
CSEPEW: 1 METS
CSEPPHR: 108 {beats}/min
Exercise duration (min): 0 min
Exercise duration (sec): 0 s
LV dias vol: 79 mL (ref 46–106)
LV sys vol: 51 mL
NUC STRESS TID: 1.03
Percent HR: 77 %

## 2017-02-19 MED ORDER — TECHNETIUM TC 99M TETROFOSMIN IV KIT
13.0000 | PACK | Freq: Once | INTRAVENOUS | Status: AC | PRN
  Administered 2017-02-19: 12.65 via INTRAVENOUS

## 2017-02-19 MED ORDER — TECHNETIUM TC 99M TETROFOSMIN IV KIT
31.0590 | PACK | Freq: Once | INTRAVENOUS | Status: AC | PRN
  Administered 2017-02-19: 31.059 via INTRAVENOUS

## 2017-02-19 MED ORDER — REGADENOSON 0.4 MG/5ML IV SOLN
0.4000 mg | Freq: Once | INTRAVENOUS | Status: AC
  Administered 2017-02-19: 0.4 mg via INTRAVENOUS

## 2017-02-19 NOTE — Telephone Encounter (Signed)
Patient notified

## 2017-02-19 NOTE — Telephone Encounter (Signed)
Please let Mia Padilla know that her stress test wasn't a great test, but looked pretty normal. Thanks!

## 2017-02-22 ENCOUNTER — Emergency Department: Payer: Medicare Other

## 2017-02-22 ENCOUNTER — Emergency Department
Admission: EM | Admit: 2017-02-22 | Discharge: 2017-02-22 | Disposition: A | Discharge status: Home / Self Care | Payer: Medicare Other | Attending: Student in an Organized Health Care Education/Training Program | Admitting: Student in an Organized Health Care Education/Training Program

## 2017-02-22 ENCOUNTER — Ambulatory Visit: Payer: Medicare Other | Admitting: Gastroenterology

## 2017-02-22 ENCOUNTER — Encounter: Payer: Self-pay | Admitting: Gastroenterology

## 2017-02-22 ENCOUNTER — Encounter: Payer: Self-pay | Admitting: *Deleted

## 2017-02-22 VITALS — BP 82/51 | HR 76 | Ht 63.0 in | Wt 180.6 lb

## 2017-02-22 DIAGNOSIS — Z7901 Long term (current) use of anticoagulants: Secondary | ICD-10-CM | POA: Insufficient documentation

## 2017-02-22 DIAGNOSIS — J449 Chronic obstructive pulmonary disease, unspecified: Secondary | ICD-10-CM | POA: Diagnosis not present

## 2017-02-22 DIAGNOSIS — E039 Hypothyroidism, unspecified: Secondary | ICD-10-CM | POA: Insufficient documentation

## 2017-02-22 DIAGNOSIS — Z87891 Personal history of nicotine dependence: Secondary | ICD-10-CM | POA: Insufficient documentation

## 2017-02-22 DIAGNOSIS — I959 Hypotension, unspecified: Secondary | ICD-10-CM | POA: Diagnosis not present

## 2017-02-22 DIAGNOSIS — N200 Calculus of kidney: Secondary | ICD-10-CM | POA: Diagnosis not present

## 2017-02-22 DIAGNOSIS — K265 Chronic or unspecified duodenal ulcer with perforation: Secondary | ICD-10-CM | POA: Diagnosis not present

## 2017-02-22 DIAGNOSIS — Z794 Long term (current) use of insulin: Secondary | ICD-10-CM | POA: Diagnosis not present

## 2017-02-22 DIAGNOSIS — I517 Cardiomegaly: Secondary | ICD-10-CM | POA: Diagnosis not present

## 2017-02-22 DIAGNOSIS — K558 Other vascular disorders of intestine: Secondary | ICD-10-CM | POA: Diagnosis not present

## 2017-02-22 DIAGNOSIS — E861 Hypovolemia: Secondary | ICD-10-CM | POA: Insufficient documentation

## 2017-02-22 DIAGNOSIS — R400 Somnolence: Secondary | ICD-10-CM | POA: Insufficient documentation

## 2017-02-22 DIAGNOSIS — I5032 Chronic diastolic (congestive) heart failure: Secondary | ICD-10-CM | POA: Diagnosis not present

## 2017-02-22 DIAGNOSIS — I9589 Other hypotension: Secondary | ICD-10-CM | POA: Insufficient documentation

## 2017-02-22 DIAGNOSIS — E119 Type 2 diabetes mellitus without complications: Secondary | ICD-10-CM | POA: Insufficient documentation

## 2017-02-22 DIAGNOSIS — Z8673 Personal history of transient ischemic attack (TIA), and cerebral infarction without residual deficits: Secondary | ICD-10-CM | POA: Diagnosis not present

## 2017-02-22 DIAGNOSIS — I110 Hypertensive heart disease with heart failure: Secondary | ICD-10-CM | POA: Insufficient documentation

## 2017-02-22 LAB — BASIC METABOLIC PANEL
Anion gap: 7 (ref 5–15)
BUN: 17 mg/dL (ref 6–20)
CALCIUM: 9 mg/dL (ref 8.9–10.3)
CO2: 27 mmol/L (ref 22–32)
CREATININE: 0.92 mg/dL (ref 0.44–1.00)
Chloride: 99 mmol/L — ABNORMAL LOW (ref 101–111)
GFR, EST NON AFRICAN AMERICAN: 57 mL/min — AB (ref >60)
Glucose, Bld: 125 mg/dL — ABNORMAL HIGH (ref 65–99)
Potassium: 4.3 mmol/L (ref 3.5–5.1)
SODIUM: 133 mmol/L — AB (ref 135–145)

## 2017-02-22 LAB — CBC
HCT: 36.7 % (ref 35.0–47.0)
Hemoglobin: 12.1 g/dL (ref 12.0–16.0)
MCH: 31.6 pg (ref 26.0–34.0)
MCHC: 32.9 g/dL (ref 32.0–36.0)
MCV: 96.1 fL (ref 80.0–100.0)
PLATELETS: 128 10*3/uL — AB (ref 150–440)
RBC: 3.83 MIL/uL (ref 3.80–5.20)
RDW: 15.9 % — ABNORMAL HIGH (ref 11.5–14.5)
WBC: 3.4 10*3/uL — AB (ref 3.6–11.0)

## 2017-02-22 LAB — LACTIC ACID, PLASMA: Lactic Acid, Venous: 1.3 mmol/L (ref 0.5–1.9)

## 2017-02-22 LAB — TROPONIN I

## 2017-02-22 MED ORDER — SODIUM CHLORIDE 0.9 % IV BOLUS (SEPSIS)
500.0000 mL | Freq: Once | INTRAVENOUS | Status: AC
  Administered 2017-02-22: 500 mL via INTRAVENOUS

## 2017-02-22 NOTE — ED Notes (Signed)
NAD noted at time of D/C. Pt taken to lobby via wheelchair. Pt's daughter denies any comments/concerns regarding D/C.

## 2017-02-22 NOTE — ED Triage Notes (Signed)
Pt was at a follow up appointment with GI from an inpatient stay with a GI bleed, pt had a sudden onset of neck pain and hypotension, pt states neck pain is better during triage, blood pressure is higher from MD office, pt denies chest pain, dizziness

## 2017-02-22 NOTE — Discharge Instructions (Addendum)
It was a pleasure to take care of you today, and thank you for coming to our emergency department.  If you have any questions or concerns before leaving please ask the nurse to grab me and I'm more than happy to go through your aftercare instructions again.  If you were prescribed any opioid pain medication today such as Norco, Vicodin, Percocet, morphine, hydrocodone, or oxycodone please make sure you do not drive when you are taking this medication as it can alter your ability to drive safely.  If you have any concerns once you are home that you are not improving or are in fact getting worse before you can make it to your follow-up appointment, please do not hesitate to call 911 and come back for further evaluation.  Darel Hong, MD  Results for orders placed or performed during the hospital encounter of 14/48/18  Basic metabolic panel  Result Value Ref Range   Sodium 133 (L) 135 - 145 mmol/L   Potassium 4.3 3.5 - 5.1 mmol/L   Chloride 99 (L) 101 - 111 mmol/L   CO2 27 22 - 32 mmol/L   Glucose, Bld 125 (H) 65 - 99 mg/dL   BUN 17 6 - 20 mg/dL   Creatinine, Ser 0.92 0.44 - 1.00 mg/dL   Calcium 9.0 8.9 - 10.3 mg/dL   GFR calc non Af Amer 57 (L) >60 mL/min   GFR calc Af Amer >60 >60 mL/min   Anion gap 7 5 - 15  CBC  Result Value Ref Range   WBC 3.4 (L) 3.6 - 11.0 K/uL   RBC 3.83 3.80 - 5.20 MIL/uL   Hemoglobin 12.1 12.0 - 16.0 g/dL   HCT 36.7 35.0 - 47.0 %   MCV 96.1 80.0 - 100.0 fL   MCH 31.6 26.0 - 34.0 pg   MCHC 32.9 32.0 - 36.0 g/dL   RDW 15.9 (H) 11.5 - 14.5 %   Platelets 128 (L) 150 - 440 K/uL  Troponin I  Result Value Ref Range   Troponin I <0.03 <0.03 ng/mL  Lactic acid, plasma  Result Value Ref Range   Lactic Acid, Venous 1.3 0.5 - 1.9 mmol/L   Ct Abdomen Pelvis Wo Contrast  Result Date: 02/22/2017 CLINICAL DATA:  Recent perforated duodenal ulcer and SMA stenosis. Pre EGD evaluation. EXAM: CT ABDOMEN AND PELVIS WITHOUT CONTRAST TECHNIQUE: Multidetector CT imaging of  the abdomen and pelvis was performed following the standard protocol without IV contrast. COMPARISON:  01/24/2017 and 11/22/2016 FINDINGS: Lower chest: Lung bases are within normal. Resolution of previous seen small pleural effusions. Moderate calcified plaque over the left main, left anterior descending and lateral circumflex coronary arteries. Calcified plaque over the thoracic aorta. Hepatobiliary: Liver and biliary tree are within normal. Minimal cholelithiasis. Pancreas: Within normal. Spleen: Calcifications present. Adrenals/Urinary Tract: Adrenal glands are normal. Kidneys are normal in size with bilateral nephrolithiasis. Bilateral cysts unchanged. No hydronephrosis. Ureters are within normal. There is a 6 mm stone over the left posterior dependent aspect of the bladder. Stomach/Bowel: Stomach is within normal. Small bowel is normal in caliber. Appendix not visualized. Surgical suture line over a loop of colon in the right upper quadrant anterior to the liver in adjacent the diaphragm. There are multiple tiny air collections adjacent this loop of colon in the left upper quadrant adjacent the diaphragm as the smaller collections are difficult to distinguish the origin as cannot exclude minimal persistent free air versus intraluminal air or possible pneumatosis. More free air worse and previously and  thought to be due to perforated duodenal ulcer which was managed conservatively. Vascular/Lymphatic: Calcified plaque over the abdominal aorta and iliac arteries. No adenopathy. Reproductive: Previous hysterectomy. Other: No free fluid. Whirling of the mesentery in the mid to upper abdomen right of midline which may be postsurgical. Evidence of surgical clips over the lower anterior abdominal wall. Musculoskeletal: Moderate degenerative change of the spine with mild degenerative change of the right hip and severe degenerative change of the left hip. IMPRESSION: Tiny air collections over the right upper quadrant  adjacent a postsurgical loop of colon anterior to the liver. These ear collections may be minimal residual free peritoneal air which is improved compared to the previous exam as history states suspected perforated duodenal ulcer managed conservatively. This air could possibly be intraluminal or even due to pneumatosis as it is difficult to distinguish the exact location of these small air collections. Consider follow-up CT as clinically indicated. Bilateral nephrolithiasis and bilateral renal cysts. Cholelithiasis. Atherosclerotic coronary artery disease. Aortic Atherosclerosis (ICD10-I70.0). 6 mm bladder stone unchanged. Electronically Signed   By: Marin Olp M.D.   On: 02/22/2017 21:46   Dg Chest 2 View  Result Date: 02/22/2017 CLINICAL DATA:  Gastrointestinal bleeding with sudden onset neck pain and hypotension. EXAM: CHEST  2 VIEW COMPARISON:  11/22/2016 FINDINGS: Cardiomegaly with aortic atherosclerosis. No pneumonic consolidation, effusion or pneumothorax. Colonic interposition noted over the liver shadow. There is thoracic spondylosis with dextroscoliosis. Osteoarthritis of the [facility] and glenohumeral joints bilaterally. IMPRESSION: Stable cardiomegaly with aortic atherosclerosis. No active pulmonary disease. Electronically Signed   By: Ashley Royalty M.D.   On: 02/22/2017 17:47   Dg Abd 1 View  Result Date: 01/27/2017 CLINICAL DATA:  Duodenal ulcer perforation. Follow-up gastric outlet obstruction. EXAM: ABDOMEN - 1 VIEW COMPARISON:  01/26/2017 and CT from 01/24/2017 FINDINGS: The oral contrast has advanced beyond the stomach and most of the contrast is within the colon including the rectum. Again noted is gas-filled colon in the right upper abdomen. There may be residual free air in the right upper abdomen as well. Hazy densities at the left lung base. Severe joint space narrowing and degenerative changes at the left hip joint. There appears to be contrast within the appendix. IMPRESSION: Oral contrast  has moved through the stomach and most of the contrast is now in the colon. No evidence for bowel obstruction. Gas in the right upper abdomen likely representing a combination of a gas-filled colon and residual free air. Limited evaluation for free air on these portable supine images. Electronically Signed   By: Markus Daft M.D.   On: 01/27/2017 08:18   Nm Myocar Multi W/spect W/wall Motion / Ef  Result Date: 02/19/2017  There was no ST segment deviation noted during stress.  No T wave inversion was noted during stress.  Defect 1: There is a small defect of mild severity present in the apex location. This is likely due to breast attenuation  The study is normal.  Poor study overall due to intense extracardiac activity.  EF calculation was not accurate. Correlate with an echo    Dg Ugi  W/kub  Result Date: 01/26/2017 CLINICAL DATA:  Clinical suspicion of duodenal ulcer perforation recently. Clinically improved. EXAM: UPPER GI SERIES WITH KUB TECHNIQUE: After obtaining a scout radiograph a routine upper GI series was performed using thick and thin barium. Effervescent crystals were not administered. FLUOROSCOPY TIME:  Fluoroscopy Time:  2 minutes Radiation Exposure Index (if provided by the fluoroscopic device): 4992 micro Gy per  meters square Number of Acquired Spot Images: 7 COMPARISON:  Abdominopelvic CT scan of January 24, 2017 FINDINGS: The patient was nauseous at the outset of the procedure. The patient ingested the Isovue-300 through a straw. Approximately 125 cc were administered. The thoracic esophagus was normal in appearance. The stomach distended reasonably well given the limited volume of contrast administered. Gastric emptying however was very delayed and never completely achieved. A tiny amount of contrast appeared to be attempting to exit the stomach into the duodenal bulb. Gastric peristalsis was normal. However, over the course of approximately 10 minutes the contrast was never able to  traverse the presumably edematous duodenal bulb. IMPRESSION: Near total obstruction of gastric emptying presumably due to an edematous duodenal bulb. No anatomic information of the duodenum is available. Abdominal CT scanning may be the most useful next imaging step. Electronically Signed   By: David  Martinique M.D.   On: 01/26/2017 11:10   Ct Angio Abd/pel W And/or Wo Contrast  Result Date: 01/24/2017 CLINICAL DATA:  Generalized abdominal pain starting today. Vomiting. Loose bowel movements. History of ventricular a rib anemia. EXAM: CTA ABDOMEN AND PELVIS wITHOUT AND WITH CONTRAST TECHNIQUE: Multidetector CT imaging of the abdomen and pelvis was performed using the standard protocol during bolus administration of intravenous contrast. Multiplanar reconstructed images and MIPs were obtained and reviewed to evaluate the vascular anatomy. CONTRAST:  100 mL Isovue 370 COMPARISON:  11/22/2016 FINDINGS: VASCULAR Aorta: Normal caliber aorta without aneurysm, dissection, vasculitis or significant stenosis. Diffuse aortic calcification. Celiac: Calcification at the origin of the celiac axis but the celiac axis and branch vessels are patent without evidence of significant stenosis. SMA: Calcification at the origin of the superior mesenteric artery with scattered calcification throughout. There is focal narrowing at the origin of the superior mesenteric artery. This represents 50% or greater diameter reduction. The vessel remains patent through this area of stenosis. There is a focal filling defect in 1 of the second or third order right mesenteric branch vessels resulting and 80-90% diameter reduction focally. This could represent thrombus or atherosclerotic plaque. The vessel remains patent distal to this area. Renals: Single bilateral renal arteries are patent with calcification at the origins. IMA: Inferior mesenteric artery is patent. Inflow: Patent without evidence of aneurysm, dissection, vasculitis or significant  stenosis. Proximal Outflow: Bilateral common femoral and visualized portions of the superficial and profunda femoral arteries are patent without evidence of aneurysm, dissection, vasculitis or significant stenosis. Veins: No obvious venous abnormality within the limitations of this arterial phase study. Review of the MIP images confirms the above findings. NON-VASCULAR Lower chest: Small bilateral pleural effusions. Mosaic attenuation pattern in the lung bases suggesting edema or diffuse pneumonia. Cardiac enlargement. Pectus excavatum. Hepatobiliary: The gallbladder is distended. There is suggestion of small stones in the dependent gallbladder. No bile duct dilatation. No focal liver lesions. Pancreas: Unremarkable. No pancreatic ductal dilatation or surrounding inflammatory changes. Spleen: Calcified granulomas in the spleen.  No splenomegaly. Adrenals/Urinary Tract: No adrenal gland nodules. Multiple intrarenal stones bilaterally. Largest is in the right upper pole and measures about 5 mm in diameter. Bilateral renal cysts are incompletely characterized. No change since prior study. Low-attenuation zone in the upper pole right kidney is again demonstrated and incompletely characterized. This could be a focal defect due to infarct, infection, or mass lesion. As previously recommended on ultrasound 12/01/2016, MRI would be suggested in the elective setting for further evaluation. No hydronephrosis or hydroureter. The bladder wall is thickened and there is gas in the  bladder. This suggests cystitis. There is a stone in the base of the bladder. Stomach/Bowel: Diffuse free intraperitoneal and a small amount of retroperitoneal air. Stomach is incompletely distended but there is evidence of gastric wall thickening. There is gas adjacent to the duodenum. This suggests the probability of a perforated duodenal ulcer. Small bowel and colon are not abnormally distended and no discrete areas of wall thickening or pneumatosis  are demonstrated. Appendix is normal. Diverticulosis of the sigmoid colon without evidence of diverticulitis. Lymphatic: Aortic atherosclerosis. No enlarged abdominal or pelvic lymph nodes. Reproductive: Status post hysterectomy. No adnexal masses. Other: Surgical sutures in the anterior abdominal wall. Musculoskeletal: Degenerative changes diffusely throughout the lumbar spine. Severe degenerative changes in the left hip. No destructive bone lesions. IMPRESSION: VASCULAR 1. Diffuse aortic and vascular calcifications. 2. Stenosis at the origin of the celiac axis and superior mesenteric artery due to calcific stenosis. 3. Focal filling defect in a mid mesenteric SMA branch may represent thrombus or atherosclerotic plaque formation. NON-VASCULAR 1. Free intraperitoneal air and a small amount of retroperitoneal air. Suggestion of gastric wall thickening with tear adjacent to the duodenum. High suspicion for perforated duodenal ulcer. No definite findings to suggest bowel ischemia. 2. Small bilateral pleural effusions. Airspace disease in the lungs probably represents edema. 3. Distended gallbladder with small stones. 4. Multiple bilateral nonobstructing intrarenal stones. 5. Hypoenhancing zone in the upper pole right kidney is unchanged since previous study. Differential diagnosis would include infarct, infection, or mass. As recommended on prior ultrasound, elective MRI is recommended for further evaluation. 6. Bladder wall thickening with gas in the bladder suggesting cystitis unless there has been instrumentation. Small bladder stone. 7. Calcified splenic granulomas. These results were called by telephone at the time of interpretation on 01/24/2017 at 6:24 am to Dr. Hinda Kehr , who verbally acknowledged these results. Electronically Signed   By: Lucienne Capers M.D.   On: 01/24/2017 06:28

## 2017-02-22 NOTE — ED Provider Notes (Signed)
Care signed over from Dr. Quentin Cornwall pending results of CT scan.  The read is complicated and was read as possible free air.  I discussed the case with on-call general surgeon Dr. Rosana Hoes who reviewed the images and is very familiar with the patient.  As the patient has no new abdominal pain and is actually feeling improved he feels that this area is actually intraluminal.  The patient's blood pressure has improved with hydration.  She has been encouraged to continue oral intake and she is discharged home in improved condition.  The patient and her family verbalized understanding and agreement the plan.   Darel Hong, MD 02/22/17 [id number]

## 2017-02-22 NOTE — ED Notes (Signed)
Pt noted to have diarrhea at this time. Diarrhea noted to be light brown, no blood noted.

## 2017-02-22 NOTE — ED Notes (Signed)
Pt placed on bedpan at this time by this RN and Amy, RN.

## 2017-02-22 NOTE — ED Notes (Signed)
Pt taken to CT.

## 2017-02-22 NOTE — Progress Notes (Signed)
Mia Antigua, MD [address] [place], [place], [place], [address] [address], [place], [place], [place], [address] Phone: [phone]  Fax: [phone]  Consultation  Referring Provider:     Valerie Roys, DO Primary Care Physician:  [name], MD Primary Gastroenterologist:  Dr. Bonna Gains         Reason for Referral:     Perforated duodenal ulcer  Date of Referral:  02/22/2017         HPI:   Mia Padilla is a 82 y.o. female presents for hospital follow up. Pt was admitted from 10/7 to 10/14 when she presented with abdominal pain and was found to have a perforated duodenal ulcer and SMA stenosis. She was managed conservatively. She was also diagnosed with NSTEMI and managed conservatively. She is here for evaluation for EGD. Pt. has a low BP today in the 80s and is falling asleep during conversation. Her daughter in law states this is unlike her. She reports some weakness today. She was discharged on Eliquis and was previously on Plavix.   Past Medical History:  Diagnosis Date  . Arthritis    hands  . Black head   . Bowel incontinence   . Breast cancer of upper-outer quadrant of left female breast ([facility]) 2015   Left  . Carotid artery stenosis   . Carpal tunnel syndrome   . CHF (congestive heart failure) ([facility])    Pt. states dx about 5-10 years ago  . COPD (chronic obstructive pulmonary disease) ([facility])   . Debilitated   . Diabetes mellitus without complication ([facility])   . Glaucoma   . History of seizures   . Hyperlipidemia   . Hypertension   . Hypotension   . Obesity   . Urinary incontinence     Past Surgical History:  Procedure Laterality Date  . BOWEL RESECTION  2005  . BREAST SURGERY  08/03/13   left mastectomy  . CARPAL TUNNEL RELEASE Right 30 years   . MASTECTOMY     left   . PARTIAL HYSTERECTOMY    . TONSILLECTOMY    . UMBILICAL HERNIA REPAIR      Prior to Admission medications   Medication Sig Start Date End Date Taking? Authorizing  Provider  acetaminophen (TYLENOL) 500 MG tablet Take 500 mg by mouth every 6 (six) hours as needed.   Yes [provider]  albuterol (PROVENTIL HFA;VENTOLIN HFA) 108 (90 Base) MCG/ACT inhaler Inhale 2 puffs into the lungs every 6 (six) hours as needed for wheezing or shortness of breath. 02/09/17  Yes Johnson, Megan P, DO  anastrozole (ARIMIDEX) 1 MG tablet Take 1 tablet (1 mg total) by mouth daily. 06/08/16  Yes Sankar, Andreas Newport, MD  apixaban (ELIQUIS) 5 MG TABS tablet Take 1 tablet (5 mg total) by mouth 2 (two) times daily. 02/09/17  Yes Johnson, Megan P, DO  atorvastatin (LIPITOR) 20 MG tablet Take 1 tablet (20 mg total) by mouth daily at 6 PM. 02/09/17  Yes Wynetta Emery, Megan P, DO  blood glucose meter kit and supplies Diagnosis:E11.9 Test glucose twice daily 11/21/14  Yes Johnson, Megan P, DO  brimonidine (ALPHAGAN P) 0.1 % SOLN 2 (two) times daily.    Yes [provider]  cyanocobalamin (,VITAMIN B-12,) 1000 MCG/ML injection Inject 1 mL (1,000 mcg total) into the muscle every 30 (thirty) days. 07/16/16  Yes Volney American, PA-C  dorzolamide (TRUSOPT) 2 % ophthalmic solution 1 drop 2 (two) times daily.    Yes [provider]  feeding supplement, ENSURE ENLIVE, (ENSURE ENLIVE) LIQD Take 237 mLs by mouth 2 (two) times daily between meals. 01/31/17  Yes Wieting, Richard, MD  fluticasone (FLONASE) 50 MCG/ACT nasal spray Place 2 sprays into both nostrils daily. 10/14/15  Yes Volney American, PA-C  Fluticasone-Salmeterol (ADVAIR) 250-50 MCG/DOSE AEPB Inhale 1 puff into the lungs every 12 (twelve) hours. 08/18/16  Yes Crissman, Jeannette How, MD  furosemide (LASIX) 20 MG tablet Take 1 tablet (20 mg total) by mouth daily. 02/09/17 02/09/18 Yes Johnson, Megan P, DO  glucose blood test strip Use as instructed 11/05/14  Yes Crissman, Jeannette How, MD  latanoprost (XALATAN) 0.005 % ophthalmic solution Place 1 drop into both eyes at bedtime.   Yes [provider]  LEVEMIR  FLEXTOUCH 100 UNIT/ML Pen Inject 6 Units into the skin daily at 10 pm. INJECT 6 UNITS PER DAY 01/31/17  Yes Wieting, Richard, MD  levothyroxine (SYNTHROID, LEVOTHROID) 100 MCG tablet Take 1 tablet (100 mcg total) by mouth daily. 11/19/16  Yes Crissman, Jeannette How, MD  losartan (COZAAR) 25 MG tablet Take 1 tablet (25 mg total) by mouth daily. 02/09/17  Yes Johnson, Megan P, DO  meclizine (ANTIVERT) 25 MG tablet Take 1 tablet (25 mg total) by mouth 3 (three) times daily as needed. 05/18/16  Yes Crissman, Jeannette How, MD  metoprolol succinate (TOPROL-XL) 100 MG 24 hr tablet Take 1 tablet (100 mg total) by mouth daily. 08/18/16  Yes Crissman, Jeannette How, MD  mometasone (ELOCON) 0.1 % cream Apply 1 application topically daily. Apply 1-2 times daily for no longer than 2 weeks at a time 11/24/16  Yes Orene Desanctis, Lilia Argue, PA-C  mometasone-formoterol [facility]) 200-5 MCG/ACT AERO Inhale 2 puffs into the lungs 2 (two) times daily. 02/09/17  Yes Johnson, Megan P, DO  mupirocin ointment (BACTROBAN) 2 % Apply 1 application topically 2 (two) times daily. Do not use day in and day out 11/19/16  Yes Crissman, Jeannette How, MD  nystatin (MYCOSTATIN/NYSTOP) powder APPY TO THE AFFECTED AREA 4 TIMES DAILY 02/17/16  Yes Johnson, Megan P, DO  Olopatadine HCl (PATADAY) 0.2 % SOLN Apply 1 drop to eye daily.    Yes [provider]  pantoprazole (PROTONIX) 40 MG tablet Take 1 tablet (40 mg total) by mouth 2 (two) times daily. 02/09/17 02/09/18 Yes Johnson, Megan P, DO  potassium chloride SA (K-DUR,KLOR-CON) 20 MEQ tablet Take 1 tablet (20 mEq total) by mouth daily. 02/01/17  Yes Wieting, Richard, MD  solifenacin (VESICARE) 10 MG tablet Take 1 tablet (10 mg total) by mouth daily. 08/18/16  Yes Crissman, Jeannette How, MD  Vitamin D, Ergocalciferol, (DRISDOL) 50000 units CAPS capsule TAKE 1 CAPSULE BY MOUTH EVERY 7 DAYS. 02/17/16  Yes Johnson, Megan P, DO    Family History  Problem Relation Age of Onset  . Cervical cancer Mother   . Diabetes Mother     . Glaucoma Mother   . Cervical cancer Daughter   . Hypertension Daughter   . Hypertension Father   . Hypertension Sister   . Hypertension Brother   . Hypertension Son   . Diabetes Brother   . HIV Brother   . Breast cancer Neg Hx      Social History   Tobacco Use  . Smoking status: Former Smoker    Last attempt to quit: 04/20/1974    Years since quitting: 42.8  . Smokeless tobacco: Never Used  . Tobacco comment: Quit 1976  Substance Use Topics  . Alcohol use: No  .  Drug use: No    Allergies as of 02/22/2017 - Review Complete 02/22/2017  Allergen Reaction Noted  . Propoxyphene Other (See Comments)   . Advil [ibuprofen] Other (See Comments) 02/11/2016  . Darvocet [propoxyphene n-acetaminophen]  06/04/2011  . Percocet [oxycodone-acetaminophen]  06/04/2011    Review of Systems:    All systems reviewed and negative except where noted in HPI.   Physical Exam:  Vital signs in last 24 hours: '@VSRANGES'$ @   General:   Pleasant, cooperative in NAD Head:  Normocephalic and atraumatic. Eyes:   No icterus.   Conjunctiva pink. PERRLA. Ears:  Normal auditory acuity. Neck:  Supple; no masses or thyroidomegaly Lungs: Respirations even and unlabored. Lungs clear to auscultation bilaterally.   No wheezes, crackles, or rhonchi.  Heart:  Regular rate and rhythm;  Without murmur, clicks, rubs or gallops Abdomen:  Soft, nondistended, nontender. Normal bowel sounds. No appreciable masses or hepatomegaly.  No rebound or guarding.  Rectal:  Not performed. Msk:  Symmetrical without gross deformities.  Extremities:  Without edema, cyanosis or clubbing. Neurologic:  Alert and oriented x3;  grossly normal neurologically. Skin:  Intact without significant lesions or rashes. Cervical Nodes:  No significant cervical adenopathy. Psych:  Alert and cooperative. Normal affect.  LAB RESULTS: No results for input(s): WBC, HGB, HCT, PLT in the last 72 hours. BMET No results for input(s): NA, K, CL,  CO2, GLUCOSE, BUN, CREATININE, CALCIUM in the last 72 hours. LFT No results for input(s): PROT, ALBUMIN, AST, ALT, ALKPHOS, BILITOT, BILIDIR, IBILI in the last 72 hours. PT/INR No results for input(s): LABPROT, INR in the last 72 hours. Labs reviewed from recent admission  STUDIES: No results found.    Impression / Plan:   Mia Padilla is a 82 y.o. y/o female with recent admission for perforated duodenal ulcer, SMA stenosis, NSTEMI on Eliquis since discharge here for evaluation for EGD but with low BP in clinic today  Pt needs further evaluation in the ER for her low BP, fluid resuscitation, repeat labs along with troponin Her recent hospital admission and comorbidities make her high risk for any procedures at this time Will need to evaluate etiology of her low BP, possibly dehydration from ?decreased PO intake since discharge. Denies any abdominal pain and abdominal exam is benign at this time She may need CT imaging of her abdomen in the ER as well.  Will follow if admitted after ER evaluation  Thank you for involving me in the care of this patient.      Virgel Manifold, MD  02/22/2017, 3:02 PM

## 2017-02-22 NOTE — ED Notes (Signed)
Pt given meal tray and water with MD permission.

## 2017-02-22 NOTE — ED Provider Notes (Signed)
[facility] Emergency Department Provider Note    First MD Initiated Contact with Patient 02/22/17 1758     (approximate)  I have reviewed the triage vital signs and the nursing notes.   HISTORY  Chief Complaint Neck Pain    HPI Mia Padilla is a 82 y.o. female with multiple chronic medical conditions with recent admission to the hospital for GI bleed and STEMI presents from GI clinic for evaluation of hypotension and drowsiness.  Since being discharged the patient has had decreased oral intake.  She did take her blood pressure medications this morning.  She denies any chest pain or shortness of breath.  While in the GI clinic she did complain of neck pain but that subsided after a few moments.  She denies any neck pain.  No numbness or tingling.  Denies any melena constipation or diarrhea.   Past Medical History:  Diagnosis Date  . Arthritis    hands  . Black head   . Bowel incontinence   . Breast cancer of upper-outer quadrant of left female breast ([place]) 2015   Left  . Carotid artery stenosis   . Carpal tunnel syndrome   . CHF (congestive heart failure) (HCC)    Pt. states dx about 5-10 years ago  . COPD (chronic obstructive pulmonary disease) ([place])   . Debilitated   . Diabetes mellitus without complication ([place])   . Glaucoma   . History of seizures   . Hyperlipidemia   . Hypertension   . Hypotension   . Obesity   . Urinary incontinence    Family History  Problem Relation Age of Onset  . Cervical cancer Mother   . Diabetes Mother   . Glaucoma Mother   . Cervical cancer Daughter   . Hypertension Daughter   . Hypertension Father   . Hypertension Sister   . Hypertension Brother   . Hypertension Son   . Diabetes Brother   . HIV Brother   . Breast cancer Neg Hx    Past Surgical History:  Procedure Laterality Date  . BOWEL RESECTION  2005  . BREAST SURGERY  08/03/13   left mastectomy  . CARPAL TUNNEL RELEASE Right 30 years   .  MASTECTOMY     left   . PARTIAL HYSTERECTOMY    . TONSILLECTOMY    . UMBILICAL HERNIA REPAIR     Patient Active Problem List   Diagnosis Date Noted  . Chronic diastolic heart failure ([place]) 02/12/2017  . NSTEMI (non-ST elevated myocardial infarction) ([place]) 01/24/2017  . Hypomagnesemia   . Hypokalemia   . Mesenteric artery stenosis ([place])   . Advanced care planning/counseling discussion 11/19/2016  . Carotid stenosis 11/17/2016  . Lymphedema 02/14/2016  . Chronic venous insufficiency 02/14/2016  . Vitamin D deficiency 11/12/2015  . B12 deficiency 11/11/2015  . Peripheral vascular disease ([place]) 04/23/2015  . Osteoarthritis 04/23/2015  . Hypothyroidism 02/07/2015  . Urinary incontinence 02/07/2015  . Breast cancer ([place]) 11/15/2013  . Malignant neoplasm of upper-outer quadrant of female breast ([place]) 06/28/2013  . Hypertension 10/15/2011  . A-fib ([place]) 06/04/2011  . COPD (chronic obstructive pulmonary disease) ([place]) 06/04/2011  . Obese 06/04/2011  . Seizures ([place]) 06/04/2011  . History of CVA (cerebrovascular accident) 06/04/2011  . Diabetes mellitus ([place]) 06/04/2011      Prior to Admission medications   Medication Sig Start Date End Date Taking? Authorizing Provider  acetaminophen (TYLENOL) 500 MG tablet Take 500 mg by mouth every 6 (six) hours as  needed.   Yes [provider]  albuterol (PROVENTIL HFA;VENTOLIN HFA) 108 (90 Base) MCG/ACT inhaler Inhale 2 puffs into the lungs every 6 (six) hours as needed for wheezing or shortness of breath. 02/09/17  Yes Johnson, Megan P, DO  anastrozole (ARIMIDEX) 1 MG tablet Take 1 tablet (1 mg total) by mouth daily. 06/08/16  Yes Sankar, Andreas Newport, MD  apixaban (ELIQUIS) 5 MG TABS tablet Take 1 tablet (5 mg total) by mouth 2 (two) times daily. 02/09/17  Yes Johnson, Megan P, DO  atorvastatin (LIPITOR) 20 MG tablet Take 1 tablet (20 mg total) by mouth daily at 6 PM. 02/09/17  Yes Johnson, Megan P, DO  brimonidine (ALPHAGAN P) 0.1 %  SOLN 2 (two) times daily.    Yes [provider]  cyanocobalamin (,VITAMIN B-12,) 1000 MCG/ML injection Inject 1 mL (1,000 mcg total) into the muscle every 30 (thirty) days. 07/16/16  Yes Volney American, PA-C  dorzolamide (TRUSOPT) 2 % ophthalmic solution 1 drop 2 (two) times daily.    Yes [provider]  fluticasone (FLONASE) 50 MCG/ACT nasal spray Place 2 sprays into both nostrils daily. 10/14/15  Yes Volney American, PA-C  Fluticasone-Salmeterol (ADVAIR) 250-50 MCG/DOSE AEPB Inhale 1 puff into the lungs every 12 (twelve) hours. 08/18/16  Yes Crissman, Jeannette How, MD  furosemide (LASIX) 20 MG tablet Take 1 tablet (20 mg total) by mouth daily. 02/09/17 02/09/18 Yes Johnson, Megan P, DO  latanoprost (XALATAN) 0.005 % ophthalmic solution Place 1 drop into both eyes at bedtime.   Yes [provider]  LEVEMIR FLEXTOUCH 100 UNIT/ML Pen Inject 6 Units into the skin daily at 10 pm. INJECT 6 UNITS PER DAY 01/31/17  Yes Wieting, Richard, MD  levothyroxine (SYNTHROID, LEVOTHROID) 100 MCG tablet Take 1 tablet (100 mcg total) by mouth daily. 11/19/16  Yes Crissman, Jeannette How, MD  losartan (COZAAR) 25 MG tablet Take 1 tablet (25 mg total) by mouth daily. 02/09/17  Yes Johnson, Megan P, DO  meclizine (ANTIVERT) 25 MG tablet Take 1 tablet (25 mg total) by mouth 3 (three) times daily as needed. 05/18/16  Yes Crissman, Jeannette How, MD  metoprolol succinate (TOPROL-XL) 100 MG 24 hr tablet Take 1 tablet (100 mg total) by mouth daily. 08/18/16  Yes Crissman, Jeannette How, MD  mometasone-formoterol (DULERA) 200-5 MCG/ACT AERO Inhale 2 puffs into the lungs 2 (two) times daily. 02/09/17  Yes Johnson, Megan P, DO  Olopatadine HCl (PATADAY) 0.2 % SOLN Apply 1 drop to eye daily.    Yes [provider]  pantoprazole (PROTONIX) 40 MG tablet Take 1 tablet (40 mg total) by mouth 2 (two) times daily. 02/09/17 02/09/18 Yes Johnson, Megan P, DO  potassium chloride SA (K-DUR,KLOR-CON) 20 MEQ tablet Take 1  tablet (20 mEq total) by mouth daily. 02/01/17  Yes Wieting, Richard, MD  solifenacin (VESICARE) 10 MG tablet Take 1 tablet (10 mg total) by mouth daily. 08/18/16  Yes Crissman, Jeannette How, MD  Vitamin D, Ergocalciferol, (DRISDOL) 50000 units CAPS capsule TAKE 1 CAPSULE BY MOUTH EVERY 7 DAYS. 02/17/16  Yes Johnson, Megan P, DO  blood glucose meter kit and supplies Diagnosis:E11.9 Test glucose twice daily 11/21/14   Johnson, Megan P, DO  feeding supplement, ENSURE ENLIVE, (ENSURE ENLIVE) LIQD Take 237 mLs by mouth 2 (two) times daily between meals. 01/31/17   Loletha Grayer, MD  glucose blood test strip Use as instructed 11/05/14   [name], MD  mometasone (ELOCON) 0.1 % cream Apply 1 application topically daily.  Apply 1-2 times daily for no longer than 2 weeks at a time 11/24/16   Volney American, PA-C  mupirocin ointment (BACTROBAN) 2 % Apply 1 application topically 2 (two) times daily. Do not use day in and day out 11/19/16   [name], MD  nystatin (MYCOSTATIN/NYSTOP) powder APPY TO THE AFFECTED AREA 4 TIMES DAILY 02/17/16   Park Liter P, DO    Allergies Propoxyphene; Advil [ibuprofen]; Darvocet [propoxyphene n-acetaminophen]; and Percocet [oxycodone-acetaminophen]    Social History Social History   Tobacco Use  . Smoking status: Former Smoker    Last attempt to quit: 04/20/1974    Years since quitting: 42.8  . Smokeless tobacco: Never Used  . Tobacco comment: Quit 1976  Substance Use Topics  . Alcohol use: No  . Drug use: No    Review of Systems Patient denies headaches, rhinorrhea, blurry vision, numbness, shortness of breath, chest pain, edema, cough, abdominal pain, nausea, vomiting, diarrhea, dysuria, fevers, rashes or hallucinations unless otherwise stated above in HPI. ____________________________________________   PHYSICAL EXAM:  VITAL SIGNS: Vitals:   02/22/17 1900 02/22/17 1930  BP: 115/62 121/67  Pulse: 77 75  Resp: (!) 22 20  Temp:    SpO2: 97%  98%    Constitutional: Alert and oriented. Elderly and frail appearing, no acute distress. Eyes: Conjunctivae are normal.  Head: Atraumatic. Nose: No congestion/rhinnorhea. Mouth/Throat: Mucous membranes are moist.   Neck: No stridor. Painless ROM.  Cardiovascular: Normal rate, regular rhythm. Grossly normal heart sounds.  Good peripheral circulation. Respiratory: Normal respiratory effort.  No retractions. Lungs CTAB. Gastrointestinal: Soft and nontender. No distention. No abdominal bruits. No CVA tenderness. Genitourinary: deferred Musculoskeletal: No lower extremity tenderness, positive BLE.  No joint effusions. Neurologic:  Normal speech and language. No gross focal neurologic deficits are appreciated. No facial droop Skin:  Skin is warm, dry and intact. No rash noted. Psychiatric: Mood and affect are normal. Speech and behavior are normal.  ____________________________________________   LABS (all labs ordered are listed, but only abnormal results are displayed)  Results for orders placed or performed during the hospital encounter of 02/22/17 (from the past 24 hour(s))  Basic metabolic panel     Status: Abnormal   Collection Time: 02/22/17  4:03 PM  Result Value Ref Range   Sodium 133 (L) 135 - 145 mmol/L   Potassium 4.3 3.5 - 5.1 mmol/L   Chloride 99 (L) 101 - 111 mmol/L   CO2 27 22 - 32 mmol/L   Glucose, Bld 125 (H) 65 - 99 mg/dL   BUN 17 6 - 20 mg/dL   Creatinine, Ser 0.92 0.44 - 1.00 mg/dL   Calcium 9.0 8.9 - 10.3 mg/dL   GFR calc non Af Amer 57 (L) >60 mL/min   GFR calc Af Amer >60 >60 mL/min   Anion gap 7 5 - 15  CBC     Status: Abnormal   Collection Time: 02/22/17  4:03 PM  Result Value Ref Range   WBC 3.4 (L) 3.6 - 11.0 K/uL   RBC 3.83 3.80 - 5.20 MIL/uL   Hemoglobin 12.1 12.0 - 16.0 g/dL   HCT 36.7 35.0 - 47.0 %   MCV 96.1 80.0 - 100.0 fL   MCH 31.6 26.0 - 34.0 pg   MCHC 32.9 32.0 - 36.0 g/dL   RDW 15.9 (H) 11.5 - 14.5 %   Platelets 128 (L) 150 - 440 K/uL   Troponin I     Status: None   Collection Time: 02/22/17  4:03 PM  Result Value Ref Range   Troponin I <0.03 <0.03 ng/mL  Lactic acid, plasma     Status: None   Collection Time: 02/22/17  6:29 PM  Result Value Ref Range   Lactic Acid, Venous 1.3 0.5 - 1.9 mmol/L   ____________________________________________  EKG My review and personal interpretation at Time: 16:06   Indication: low BP  Rate: 85  Rhythm: sinus Axis: left Other: non specifc st changes, appear improved compared to previous EKG 01/24/17 ____________________________________________  RADIOLOGY  I personally reviewed all radiographic images ordered to evaluate for the above acute complaints and reviewed radiology reports and findings.  These findings were personally discussed with the patient.  Please see medical record for radiology report.  ____________________________________________   PROCEDURES  Procedure(s) performed:  Procedures    Critical Care performed: no ____________________________________________   INITIAL IMPRESSION / ASSESSMENT AND PLAN / ED COURSE  Pertinent labs & imaging results that were available during my care of the patient were reviewed by me and considered in my medical decision making (see chart for details).  DDX: dehydration, aki, acs, sepsis, uti, anemia, GIB, poor po intake  JAKAYLAH SCHLAFER is a 82 y.o. who presents to the ED with episode of hypotension seen in clinic today.  Patient is currently well-appearing blood pressure is improved.  Describes decreased oral intake of uncertain etiology but has had recent hospitalizations concerning for ileus.  Given her age and risk factors certainly be concerned about perforation.  Less consistent with small bowel obstruction though this is also a consideration.  We will give gentle IV fluid hydration given her history of heart failure.  She has no evidence of acute ischemia and her troponin is negative with negative workup for chest pain on last  admission.  Her stool is light brown no melena or hematochezia.  Patient will be signed out to Dr. Mable Paris follow-up of CT imaging.  If normal I do anticipate stable for discharge home.      ____________________________________________   FINAL CLINICAL IMPRESSION(S) / ED DIAGNOSES  Final diagnoses:  Hypotension due to hypovolemia      NEW MEDICATIONS STARTED DURING THIS VISIT:  This SmartLink is deprecated. Use AVSMEDLIST instead to display the medication list for a patient.   Note:  This document was prepared using Dragon voice recognition software and may include unintentional dictation errors.    Merlyn Lot, MD 02/22/17 2122

## 2017-02-22 NOTE — ED Notes (Signed)
Dr. Robinson at bedside at this time. 

## 2017-02-23 ENCOUNTER — Encounter: Payer: Self-pay | Admitting: Urology

## 2017-02-23 ENCOUNTER — Ambulatory Visit: Payer: Medicare Other | Admitting: Urology

## 2017-02-23 ENCOUNTER — Telehealth: Payer: Self-pay | Admitting: Gastroenterology

## 2017-02-23 VITALS — BP 81/44 | HR 99 | Ht 63.0 in | Wt 180.0 lb

## 2017-02-23 DIAGNOSIS — N200 Calculus of kidney: Secondary | ICD-10-CM | POA: Diagnosis not present

## 2017-02-23 DIAGNOSIS — N2889 Other specified disorders of kidney and ureter: Secondary | ICD-10-CM

## 2017-02-23 NOTE — Progress Notes (Signed)
02/23/2017 3:26 PM   Mia Padilla [birth year] [id number]  Referring provider: [name], MD [address] [place], [place] [address]  Chief Complaint  Patient presents with  . Renal mass    HPI: 82 year old African-American female with multiple medical comorbidities referred for further evaluation of incidental possible left upper pole renal mass.  There is an area of low attenuation which is not completely characterized on the study.  This area appeared to be stable from a scan performed in 11/2016.  It was not appreciated on renal ultrasound.  She denies personal history of flank pain or gross hematuria.  She does have incidental bladder wall thickening and a small 6 mm bladder stone appreciated on imaging.  She does have baseline urinary symptoms including urinary frequency, urgency, difficulty ambulating to the bathroom, and incontinence.  This is stable and unchanged.  PMH: Past Medical History:  Diagnosis Date  . Arthritis    hands  . Black head   . Bowel incontinence   . Breast cancer of upper-outer quadrant of left female breast ([place]) 2015   Left  . Carotid artery stenosis   . Carpal tunnel syndrome   . CHF (congestive heart failure) (HCC)    Pt. states dx about 5-10 years ago  . COPD (chronic obstructive pulmonary disease) ([place])   . Debilitated   . Diabetes mellitus without complication ([place])   . Glaucoma   . History of seizures   . Hyperlipidemia   . Hypertension   . Hypotension   . Obesity   . Urinary incontinence     Surgical History: Past Surgical History:  Procedure Laterality Date  . BOWEL RESECTION  2005  . BREAST SURGERY  08/03/13   left mastectomy  . CARPAL TUNNEL RELEASE Right 30 years   . MASTECTOMY     left   . PARTIAL HYSTERECTOMY    . TONSILLECTOMY    . UMBILICAL HERNIA REPAIR      Home Medications:  Allergies as of 02/23/2017      Reactions   Propoxyphene Other (See Comments)   Other Reaction: Severe Headache   Advil  [ibuprofen] Other (See Comments)   Makes her heart race   Darvocet [propoxyphene N-acetaminophen]    Percocet [oxycodone-acetaminophen]    hallucination      Medication List        Accurate as of 02/23/17 11:59 PM. Always use your most recent med list.          acetaminophen 500 MG tablet Commonly known as:  TYLENOL Take 500 mg by mouth every 6 (six) hours as needed.   albuterol 108 (90 Base) MCG/ACT inhaler Commonly known as:  PROVENTIL HFA;VENTOLIN HFA Inhale 2 puffs into the lungs every 6 (six) hours as needed for wheezing or shortness of breath.   anastrozole 1 MG tablet Commonly known as:  ARIMIDEX Take 1 tablet (1 mg total) by mouth daily.   apixaban 5 MG Tabs tablet Commonly known as:  ELIQUIS Take 1 tablet (5 mg total) by mouth 2 (two) times daily.   atorvastatin 20 MG tablet Commonly known as:  LIPITOR Take 1 tablet (20 mg total) by mouth daily at 6 PM.   blood glucose meter kit and supplies Diagnosis:E11.9 Test glucose twice daily   brimonidine 0.1 % Soln Commonly known as:  ALPHAGAN P 2 (two) times daily.   cyanocobalamin 1000 MCG/ML injection Commonly known as:  (VITAMIN B-12) Inject 1 mL (1,000 mcg total) into the muscle every 30 (thirty)  days.   dorzolamide 2 % ophthalmic solution Commonly known as:  TRUSOPT 1 drop 2 (two) times daily.   feeding supplement (ENSURE ENLIVE) Liqd Take 237 mLs by mouth 2 (two) times daily between meals.   fluticasone 50 MCG/ACT nasal spray Commonly known as:  FLONASE Place 2 sprays into both nostrils daily.   Fluticasone-Salmeterol 250-50 MCG/DOSE Aepb Commonly known as:  ADVAIR Inhale 1 puff into the lungs every 12 (twelve) hours.   furosemide 20 MG tablet Commonly known as:  LASIX Take 1 tablet (20 mg total) by mouth daily.   glucose blood test strip Use as instructed   latanoprost 0.005 % ophthalmic solution Commonly known as:  XALATAN Place 1 drop into both eyes at bedtime.   LEVEMIR FLEXTOUCH 100  UNIT/ML Pen Generic drug:  Insulin Detemir Inject 6 Units into the skin daily at 10 pm. INJECT 6 UNITS PER DAY   levothyroxine 100 MCG tablet Commonly known as:  SYNTHROID, LEVOTHROID Take 1 tablet (100 mcg total) by mouth daily.   losartan 25 MG tablet Commonly known as:  COZAAR Take 1 tablet (25 mg total) by mouth daily.   meclizine 25 MG tablet Commonly known as:  ANTIVERT Take 1 tablet (25 mg total) by mouth 3 (three) times daily as needed.   metoprolol succinate 100 MG 24 hr tablet Commonly known as:  TOPROL-XL Take 1 tablet (100 mg total) by mouth daily.   mometasone 0.1 % cream Commonly known as:  ELOCON Apply 1 application topically daily. Apply 1-2 times daily for no longer than 2 weeks at a time   mometasone-formoterol 200-5 MCG/ACT Aero Commonly known as:  DULERA Inhale 2 puffs into the lungs 2 (two) times daily.   mupirocin ointment 2 % Commonly known as:  BACTROBAN Apply 1 application topically 2 (two) times daily. Do not use day in and day out   nystatin powder Commonly known as:  MYCOSTATIN/NYSTOP APPY TO THE AFFECTED AREA 4 TIMES DAILY   pantoprazole 40 MG tablet Commonly known as:  PROTONIX Take 1 tablet (40 mg total) by mouth 2 (two) times daily.   PATADAY 0.2 % Soln Generic drug:  Olopatadine HCl Apply 1 drop to eye daily.   potassium chloride SA 20 MEQ tablet Commonly known as:  K-DUR,KLOR-CON Take 1 tablet (20 mEq total) by mouth daily.   solifenacin 10 MG tablet Commonly known as:  VESICARE Take 1 tablet (10 mg total) by mouth daily.   Vitamin D (Ergocalciferol) 50000 units Caps capsule Commonly known as:  DRISDOL TAKE 1 CAPSULE BY MOUTH EVERY 7 DAYS.       Allergies:  Allergies  Allergen Reactions  . Propoxyphene Other (See Comments)    Other Reaction: Severe Headache  . Advil [Ibuprofen] Other (See Comments)    Makes her heart race  . Darvocet [Propoxyphene N-Acetaminophen]   . Percocet [Oxycodone-Acetaminophen]      hallucination    Family History: Family History  Problem Relation Age of Onset  . Cervical cancer Mother   . Diabetes Mother   . Glaucoma Mother   . Cervical cancer Daughter   . Hypertension Daughter   . Hypertension Father   . Hypertension Sister   . Hypertension Brother   . Hypertension Son   . Diabetes Brother   . HIV Brother   . Breast cancer Neg Hx     Social History:  reports that she quit smoking about 42 years ago. she has never used smokeless tobacco. She reports that she does not  drink alcohol or use drugs.  ROS: UROLOGY Frequent Urination?: Yes Hard to postpone urination?: Yes Burning/pain with urination?: No Get up at night to urinate?: Yes Leakage of urine?: Yes Urine stream starts and stops?: No Trouble starting stream?: No Do you have to strain to urinate?: No Blood in urine?: No Urinary tract infection?: Yes Sexually transmitted disease?: No Injury to kidneys or bladder?: No Painful intercourse?: No Weak stream?: No Currently pregnant?: No Vaginal bleeding?: No Last menstrual period?: n  Gastrointestinal Nausea?: No Vomiting?: No Indigestion/heartburn?: No Diarrhea?: No Constipation?: No  Constitutional Fever: No Night sweats?: No Weight loss?: No Fatigue?: No  Skin Skin rash/lesions?: No Itching?: No  Eyes Blurred vision?: Yes Double vision?: Yes  Ears/Nose/Throat Sore throat?: No Sinus problems?: Yes  Hematologic/Lymphatic Swollen glands?: No Easy bruising?: Yes  Cardiovascular Leg swelling?: Yes Chest pain?: Yes  Respiratory Cough?: Yes Shortness of breath?: Yes  Endocrine Excessive thirst?: No  Musculoskeletal Back pain?: Yes Joint pain?: Yes  Neurological Headaches?: Yes Dizziness?: Yes  Psychologic Depression?: Yes Anxiety?: Yes  Physical Exam: BP (!) 81/44   Pulse 99   Ht '5\' 3"'$  (1.6 m)   Wt 180 lb (81.6 kg)   BMI 31.89 kg/m  Constitutional:  Alert and oriented, No acute distress. Elderly.   Ambulating with walker.  Complaint today by family member. HEENT: [place] AT, moist mucus membranes.  Trachea midline, no masses. Cardiovascular: Bilateral LE edema appreciated.   Respiratory: Normal respiratory effort, no increased work of breathing. GI: Abdomen is soft, nontender, nondistended, no abdominal masses GU: No CVA tenderness.  Skin: No rashes, bruises or suspicious lesions. Neurologic: Grossly intact, no focal deficits, moving all 4 extremities. Psychiatric: Normal mood and affect.  Laboratory Data: Lab Results  Component Value Date   WBC 3.4 (L) 02/22/2017   HGB 12.1 02/22/2017   HCT 36.7 02/22/2017   MCV 96.1 02/22/2017   PLT 128 (L) 02/22/2017    Lab Results  Component Value Date   CREATININE 0.92 02/22/2017    Lab Results  Component Value Date   HGBA1C 5.8 (H) 01/24/2017    Urinalysis UA from 01/24/2017 reviewed.  This is negative for RBCs or WBCs.  Pertinent Imaging: Results for orders placed during the hospital encounter of 12/01/16  US Renal   Narrative CLINICAL DATA:  Mass on CT  EXAM: RENAL / URINARY TRACT ULTRASOUND COMPLETE  COMPARISON:  CT 11/22/2016  FINDINGS: Right Kidney:  Length: 10.3 cm. Echogenicity within normal limits. 9 mm stone lower pole. No hydronephrosis. Septated cyst or 2 adjacent cystic masses in the upper pole measuring 2.2 x 2.6 x 2.2 cm. No definitive correlate seen for this CT abnormality.  Left Kidney:  Length: 5.8 cm. Echogenicity within normal limits. Negative for hydronephrosis. 7 mm stone lower pole. Cyst upper pole measuring 2.5 x 2.4 x 2.3 cm  Bladder:  Slightly thick-walled  appearance  IMPRESSION: 1. No discrete solid mass seen to correspond to the CT finding, however complex cystic area is present within the upper pole ; MRI suggested for further evaluation. Additional probable cyst in the upper pole of left kidney 2. Bilateral intrarenal calculi with no hydronephrosis 3. Slightly thick-walled  appearance of bladder, correlate to exclude cystitis   Electronically Signed   By: Donavan Foil M.D.   On: 12/01/2016 20:03     CTA angios abdomen pelvis with contrast on 01/24/2017 NON-VASCULAR  1. Free intraperitoneal air and a small amount of retroperitoneal air. Suggestion of gastric wall thickening with tear adjacent to  the duodenum. High suspicion for perforated duodenal ulcer. No definite findings to suggest bowel ischemia. 2. Small bilateral pleural effusions. Airspace disease in the lungs probably represents edema. 3. Distended gallbladder with small stones. 4. Multiple bilateral nonobstructing intrarenal stones. 5. Hypoenhancing zone in the upper pole right kidney is unchanged since previous study. Differential diagnosis would include infarct, infection, or mass. As recommended on prior ultrasound, elective MRI is recommended for further evaluation. 6. Bladder wall thickening with gas in the bladder suggesting cystitis unless there has been instrumentation. Small bladder stone. 7. Calcified splenic granulomas. These results were called by telephone at the time of interpretation on 01/24/2017 at 6:24 am to Dr. Hinda Kehr , who verbally acknowledged these results.  Renal ultrasound and CT abdomen pelvis with contrast personally reviewed today.  Assessment & Plan:    1. Right renal mass Incidental right renal mass, not completely characterized on CT scan and not visible on renal ultrasound Differential diagnosis includes cortical infarct, infection, cyst or other benign lesion, or underlying renal regnancy I recommended further evaluation with MRI She will follow-up shortly thereafter - MR Abdomen W Wo Contrast; Future  2. Nephrolithiasis Multiple bilateral stones measuring up to 5 mm Currently asymptomatic, would recommend continued observation Small stone in bladder likely recently passed kidney stone   Return in about 3 months (around 05/26/2017) for MR  abdomen.  Hollice Espy, MD  [facility] [address] [place], [place] [address] [phone]

## 2017-02-23 NOTE — Telephone Encounter (Signed)
Advised patient to stay on soft diet until PCP appt in 2 weeks.

## 2017-02-23 NOTE — Telephone Encounter (Signed)
Please call patient regarding a soft diet that the hospital put her on. Please call her

## 2017-02-23 NOTE — Patient Instructions (Signed)
Evaluation of small right renal mass  Unclear etiology- cancer, infarct, cyst  Will order MRI of abdomen/ kidney in 3 months

## 2017-02-24 DIAGNOSIS — I4891 Unspecified atrial fibrillation: Secondary | ICD-10-CM | POA: Diagnosis not present

## 2017-02-24 DIAGNOSIS — E119 Type 2 diabetes mellitus without complications: Secondary | ICD-10-CM | POA: Diagnosis not present

## 2017-02-24 DIAGNOSIS — D510 Vitamin B12 deficiency anemia due to intrinsic factor deficiency: Secondary | ICD-10-CM | POA: Diagnosis not present

## 2017-02-24 DIAGNOSIS — I951 Orthostatic hypotension: Secondary | ICD-10-CM | POA: Diagnosis not present

## 2017-02-25 DIAGNOSIS — D510 Vitamin B12 deficiency anemia due to intrinsic factor deficiency: Secondary | ICD-10-CM | POA: Diagnosis not present

## 2017-02-25 DIAGNOSIS — I4891 Unspecified atrial fibrillation: Secondary | ICD-10-CM | POA: Diagnosis not present

## 2017-02-25 DIAGNOSIS — I951 Orthostatic hypotension: Secondary | ICD-10-CM | POA: Diagnosis not present

## 2017-02-25 DIAGNOSIS — E119 Type 2 diabetes mellitus without complications: Secondary | ICD-10-CM | POA: Diagnosis not present

## 2017-03-01 ENCOUNTER — Telehealth: Payer: Self-pay | Admitting: Family Medicine

## 2017-03-01 ENCOUNTER — Ambulatory Visit: Payer: Medicare Other | Admitting: Family Medicine

## 2017-03-01 ENCOUNTER — Encounter: Payer: Self-pay | Admitting: Family Medicine

## 2017-03-01 VITALS — BP 114/62 | HR 90 | Temp 96.5°F | Wt 182.4 lb

## 2017-03-01 DIAGNOSIS — K269 Duodenal ulcer, unspecified as acute or chronic, without hemorrhage or perforation: Secondary | ICD-10-CM

## 2017-03-01 DIAGNOSIS — R5383 Other fatigue: Secondary | ICD-10-CM | POA: Diagnosis not present

## 2017-03-01 DIAGNOSIS — I10 Essential (primary) hypertension: Secondary | ICD-10-CM

## 2017-03-01 DIAGNOSIS — N390 Urinary tract infection, site not specified: Secondary | ICD-10-CM

## 2017-03-01 MED ORDER — CIPROFLOXACIN HCL 250 MG PO TABS: 250.0000 mg | ORAL_TABLET | Freq: Two times a day (BID) | ORAL | 0 refills | Status: DC

## 2017-03-01 MED ORDER — PANTOPRAZOLE SODIUM 40 MG PO TBEC: 40.0000 mg | DELAYED_RELEASE_TABLET | Freq: Two times a day (BID) | ORAL | 1 refills | Status: DC

## 2017-03-01 NOTE — Telephone Encounter (Unsigned)
Copied from [place] [id number]. Topic: Inquiry >> Mar 01, 2017  1:30 PM Neva Seat wrote: Wants to know if medicine prescribed today is for a UTI she has.  Wants all Rx;s sent to [facility] - on [address]

## 2017-03-01 NOTE — Progress Notes (Signed)
BP 114/62 (BP Location: Right Arm, Patient Position: Sitting, Cuff Size: Large)   Pulse 90   Temp (!) 96.5 F (35.8 C) (Axillary)   Wt 182 lb 6 oz (82.7 kg)   SpO2 96%   BMI 32.31 kg/m    Subjective:    Patient ID: Mia Padilla, female    DOB: 04-Nov-1934, 82 y.o.   MRN: [id number]  HPI: Mia Padilla is a 82 y.o. female  Chief Complaint  Patient presents with  . Hospitalization Follow-up  . Medication Refill    Pantoprazole, meclizine, generic fosamax   Patient presents today for hospital f/u. Recently sent to ER from the GI clinic d/t hypotension secondary to hypovolemia and some altered mental status/sedation. Replenished with IVF with improvement of BP and MS and d/c'd home. States she's been feeling some better since d/c but has periods of extreme fatigue and malaise the past few days. Denies fever, urinary symptoms, cough, N/V/D.   Patient seems unclear on a few of her medications today. Thinks she may have been told at some point the past month to stop losartan and potassium pills but not sure when or from who she was told.   Relevant past medical, surgical, family and social history reviewed and updated as indicated. Interim medical history since our last visit reviewed. Allergies and medications reviewed and updated.  Review of Systems  Constitutional: Positive for fatigue.  HENT: Negative.   Respiratory: Negative.   Cardiovascular: Negative.   Gastrointestinal: Negative.   Musculoskeletal: Negative.   Neurological: Positive for weakness.  Psychiatric/Behavioral: Negative.     Per HPI unless specifically indicated above     Objective:    BP 114/62 (BP Location: Right Arm, Patient Position: Sitting, Cuff Size: Large)   Pulse 90   Temp (!) 96.5 F (35.8 C) (Axillary)   Wt 182 lb 6 oz (82.7 kg)   SpO2 96%   BMI 32.31 kg/m   Wt Readings from Last 3 Encounters:  03/01/17 182 lb 6 oz (82.7 kg)  02/23/17 180 lb (81.6 kg)  02/22/17 180 lb (81.6 kg)      Physical Exam  Constitutional: She appears well-developed.  Falling asleep several times during interview today, has several moments of scattered thoughts and confusion that is not baseline for patient  HENT:  Head: Atraumatic.  Eyes: Conjunctivae are normal. Pupils are equal, round, and reactive to light.  Neck: Normal range of motion. Neck supple.  Cardiovascular: Normal rate.  Pulmonary/Chest: Effort normal and breath sounds normal. No respiratory distress.  Abdominal: Soft. Bowel sounds are normal. There is no tenderness.  Musculoskeletal:  Weaker than baseline today, was transferred from her walker to a wheelchair during visit as she was unable to get out of chair  Neurological:  Alert throughout most of interview   Skin: Skin is warm and dry.  Psychiatric:  Sensible speech through part of visit, but moments of disorganized speech and thoughts as well as dozing off at certain points  Nursing note and vitals reviewed.     Assessment & Plan:   Problem List Items Addressed This Visit      Cardiovascular and Mediastinum   Hypertension    BP improved from initial ER readings today. Unclear if she is to be on losartan or not, but given soft BPs the past month or so encouraged continued d/c of the medication. Restart potassium (pt unsure if she actually was taking it) alongside lasix until upcoming f/u with HF clinic for further recommendations. Last  BMP one week ago electrolytes were WNL.        Other Visit Diagnoses    Acute lower UTI    -  Primary   Await urine culture. Will start cipro in the meantime. Encouraged good water intake, full bladder voiding.    Relevant Orders   UA/M w/rflx Culture, Routine (Completed)   Duodenal ulcer       Has upcoming f/u Dec. 4th with GI, upper GI series was postponed due to hypotension. Abdominal pain tolerable currently. Advance diet as tolerated until then    Given patients slightly altered periods of mental status from baseline today, a  U/A was obtained during visit. Does have a hx of UTIs with similar behavior. U/A was positive for a UTI. Will have her f/u with PCP in 3 days for recheck given increased weakness and periods of sedation and confusion today. Home health called and asked to increase check ins and PT services.   Follow up plan: Return in about 3 days (around 03/04/2017) for recheck.

## 2017-03-02 DIAGNOSIS — I4891 Unspecified atrial fibrillation: Secondary | ICD-10-CM | POA: Diagnosis not present

## 2017-03-02 DIAGNOSIS — I214 Non-ST elevation (NSTEMI) myocardial infarction: Secondary | ICD-10-CM | POA: Diagnosis not present

## 2017-03-02 DIAGNOSIS — D510 Vitamin B12 deficiency anemia due to intrinsic factor deficiency: Secondary | ICD-10-CM | POA: Diagnosis not present

## 2017-03-02 DIAGNOSIS — B351 Tinea unguium: Secondary | ICD-10-CM | POA: Diagnosis not present

## 2017-03-02 DIAGNOSIS — J449 Chronic obstructive pulmonary disease, unspecified: Secondary | ICD-10-CM | POA: Diagnosis not present

## 2017-03-02 DIAGNOSIS — K265 Chronic or unspecified duodenal ulcer with perforation: Secondary | ICD-10-CM | POA: Diagnosis not present

## 2017-03-02 DIAGNOSIS — E119 Type 2 diabetes mellitus without complications: Secondary | ICD-10-CM | POA: Diagnosis not present

## 2017-03-02 DIAGNOSIS — I951 Orthostatic hypotension: Secondary | ICD-10-CM | POA: Diagnosis not present

## 2017-03-02 DIAGNOSIS — K551 Chronic vascular disorders of intestine: Secondary | ICD-10-CM | POA: Diagnosis not present

## 2017-03-02 DIAGNOSIS — I5033 Acute on chronic diastolic (congestive) heart failure: Secondary | ICD-10-CM | POA: Diagnosis not present

## 2017-03-02 DIAGNOSIS — C50412 Malignant neoplasm of upper-outer quadrant of left female breast: Secondary | ICD-10-CM | POA: Diagnosis not present

## 2017-03-03 DIAGNOSIS — K265 Chronic or unspecified duodenal ulcer with perforation: Secondary | ICD-10-CM | POA: Diagnosis not present

## 2017-03-03 DIAGNOSIS — I5033 Acute on chronic diastolic (congestive) heart failure: Secondary | ICD-10-CM | POA: Diagnosis not present

## 2017-03-03 DIAGNOSIS — J449 Chronic obstructive pulmonary disease, unspecified: Secondary | ICD-10-CM | POA: Diagnosis not present

## 2017-03-03 DIAGNOSIS — C50412 Malignant neoplasm of upper-outer quadrant of left female breast: Secondary | ICD-10-CM | POA: Diagnosis not present

## 2017-03-03 DIAGNOSIS — I951 Orthostatic hypotension: Secondary | ICD-10-CM | POA: Diagnosis not present

## 2017-03-03 DIAGNOSIS — E119 Type 2 diabetes mellitus without complications: Secondary | ICD-10-CM | POA: Diagnosis not present

## 2017-03-03 DIAGNOSIS — I4891 Unspecified atrial fibrillation: Secondary | ICD-10-CM | POA: Diagnosis not present

## 2017-03-03 DIAGNOSIS — D510 Vitamin B12 deficiency anemia due to intrinsic factor deficiency: Secondary | ICD-10-CM | POA: Diagnosis not present

## 2017-03-03 DIAGNOSIS — K551 Chronic vascular disorders of intestine: Secondary | ICD-10-CM | POA: Diagnosis not present

## 2017-03-03 DIAGNOSIS — I214 Non-ST elevation (NSTEMI) myocardial infarction: Secondary | ICD-10-CM | POA: Diagnosis not present

## 2017-03-03 NOTE — Assessment & Plan Note (Addendum)
BP improved from initial ER readings today. Unclear if she is to be on losartan or not, but given soft BPs the past month or so encouraged continued d/c of the medication. Restart potassium (pt unsure if she actually was taking it) alongside lasix until upcoming f/u with HF clinic for further recommendations. Last BMP one week ago electrolytes were WNL.

## 2017-03-03 NOTE — Patient Instructions (Signed)
Follow up in 3 days

## 2017-03-04 ENCOUNTER — Telehealth: Payer: Self-pay | Admitting: Family Medicine

## 2017-03-04 ENCOUNTER — Ambulatory Visit: Payer: Medicare Other | Admitting: Family Medicine

## 2017-03-04 DIAGNOSIS — I4891 Unspecified atrial fibrillation: Secondary | ICD-10-CM | POA: Diagnosis not present

## 2017-03-04 DIAGNOSIS — I5033 Acute on chronic diastolic (congestive) heart failure: Secondary | ICD-10-CM | POA: Diagnosis not present

## 2017-03-04 DIAGNOSIS — D510 Vitamin B12 deficiency anemia due to intrinsic factor deficiency: Secondary | ICD-10-CM | POA: Diagnosis not present

## 2017-03-04 DIAGNOSIS — I951 Orthostatic hypotension: Secondary | ICD-10-CM | POA: Diagnosis not present

## 2017-03-04 DIAGNOSIS — J449 Chronic obstructive pulmonary disease, unspecified: Secondary | ICD-10-CM | POA: Diagnosis not present

## 2017-03-04 DIAGNOSIS — K551 Chronic vascular disorders of intestine: Secondary | ICD-10-CM | POA: Diagnosis not present

## 2017-03-04 DIAGNOSIS — K265 Chronic or unspecified duodenal ulcer with perforation: Secondary | ICD-10-CM | POA: Diagnosis not present

## 2017-03-04 DIAGNOSIS — I214 Non-ST elevation (NSTEMI) myocardial infarction: Secondary | ICD-10-CM | POA: Diagnosis not present

## 2017-03-04 DIAGNOSIS — C50412 Malignant neoplasm of upper-outer quadrant of left female breast: Secondary | ICD-10-CM | POA: Diagnosis not present

## 2017-03-04 DIAGNOSIS — E119 Type 2 diabetes mellitus without complications: Secondary | ICD-10-CM | POA: Diagnosis not present

## 2017-03-04 MED ORDER — NITROFURANTOIN MONOHYD MACRO 100 MG PO CAPS: 100.0000 mg | ORAL_CAPSULE | Freq: Two times a day (BID) | ORAL | 0 refills | Status: DC

## 2017-03-04 NOTE — Telephone Encounter (Signed)
Copied from [place] [id number]. Topic: Quick Communication - See Telephone Encounter >> Mar 04, 2017  4:25 PM Cleaster Corin, [place] wrote: CRM for notification. See Telephone encounter for:  03/04/17.

## 2017-03-04 NOTE — Telephone Encounter (Signed)
[facility]  Advanced Heart Failure and Transplant  Heart Transplant Clinic  Follow-up Visit    Primary Care Physician: Brodsky, Mark E  Referring Provider: Brett Justin Berman  Date of Transplant: 06/09/2019  Organ(s) Transplanted: heart  Indication for transplant: Dilated Myopathy: Idiopathic  PHS increased risk donor: Yes    ID. 82 year old female with end-stage HFrEF 2/2 NICM s/p OHT 06/09/19, history of 2R, HTN, HLD and anxiety coming in for f/u of heart transplant.    Interval History:    The patient was last seen on 08/04/21. At that time issues with pain after urologic procedure.    He continues to deal with pain issue largely from prostate surgery. He still has some bleeding and some tissue come out. He tried different strategies and nothing helped. This is really impacting quality of life. He gets tired and frustrated and does not want to take it out on his family.    ROS:  A complete ROS was performed and is negative except as documented in the HPI.      Allergies:  Patient is allergic to cats [other] and dogs [other].    Past Medical History:   Diagnosis Date    Asthma     Atrial fibrillation (CMS-HCC)     Chronic HFrEF (heart failure with reduced ejection fraction) (CMS-HCC)     GERD (gastroesophageal reflux disease)     HTN (hypertension)     Insomnia     Nephrolithiasis     Sinusitis      Patient Active Problem List   Diagnosis    COPD (chronic obstructive pulmonary disease) (CMS-HCC)    Heart transplant, orthotopic, 06/09/2019    Pericardial effusion    Hypertension    Chronic back pain    At risk for infection transmitted from donor    Acute hepatitis C virus infection    Heart transplanted (CMS-HCC)    Acute UTI    Umbilical hernia without obstruction and without gangrene    COVID-19 virus detected    Acute medial meniscus tear of left knee, sequela    Localized osteoarthritis of left knee     Past Surgical History:   Procedure Laterality Date    CARDIAC DEFIBRILLATOR PLACEMENT       PB ANESTH,SHOULDER JOINT,NOS Right      Family History   Problem Relation Name Age of Onset    Hypertension Other      Other Maternal Grandmother          kidney disease needing HD     Social History     Socioeconomic History    Marital status: Single     Spouse name: Not on file    Number of children: Not on file    Years of education: Not on file    Highest education level: Not on file   Occupational History    Not on file   Tobacco Use    Smoking status: Never    Smokeless tobacco: Never    Tobacco comments:     from friends and relatives    Substance and Sexual Activity    Alcohol use: Not Currently     Comment: Prior heavier use, but completely quit in 2016    Drug use: Yes     Comment: eats edible marijuana for pain and insomnia     Sexual activity: Not on file   Other Topics Concern    Not on file   Social History Narrative      Born in [place], also lived in [place], [place], [place], no travel, worked as a carpenter, occasional cedar, no birds, no hot tubs, worked in construction + possible asbestos exposure      Social Determinants of Health     Financial Resource Strain: Not on file   Food Insecurity: Not on file   Transportation Needs: Not on file   Physical Activity: Not on file   Stress: Not on file   Social Connections: Not on file   Intimate Partner Violence: Not on file   Housing Stability: Not on file     Current Outpatient Medications   Medication Sig    albuterol 108 (90 Base) MCG/ACT inhaler Inhale 2 puffs by mouth every 4 hours as needed for Wheezing or Shortness of Breath.    aspirin 81 MG EC tablet Take 1 tablet (81 mg) by mouth daily.    baclofen (LIORESAL) 10 MG tablet Take 2 tablets (20 mg) by mouth nightly.    Blood Glucose Monitoring Suppl (TRUE METRIX METER) w/Device KIT Use as directed    budesonide-formoterol (SYMBICORT) 160-4.5 MCG/ACT inhaler Inhale 2 puffs by mouth every 12 hours.    bumetanide (BUMEX) 1 MG tablet Take 1 tablet (1 mg) by mouth daily as needed (fluid/weight  gain). Do not take unless instructed by Transplant team.    Calcium Carb-Cholecalciferol 600-10 MG-MCG TABS Take 1 tablet by mouth 2 times daily.    Cetirizine HCl (ZERVIATE) 0.24 % SOLN Place 1 drop into both eyes 2 times daily.    clindamycin (CLEOCIN T) 1 % solution Apply 1 Application. topically 2 times daily. Apply to the red bumps on your face up to two times a day.    controlled substance agreement controlled substance agreement    diclofenac (VOLTAREN) 1 % gel Apply 2 g topically 4 times daily.    docusate sodium (COLACE) 100 MG capsule Take 1 capsule (100 mg) by mouth 2 times daily.    DULoxetine (CYMBALTA) 30 MG CR capsule Take 1 capsule (30 mg) by mouth daily.    famotidine (PEPCID) 20 MG tablet Take 1 tablet (20 mg) by mouth 2 times daily.    fluticasone propionate (FLONASE) 50 MCG/ACT nasal spray Spray 1 spray into each nostril 2 times daily.    gabapentin (NEURONTIN) 300 MG capsule Take 1 capsule (300 mg) by mouth every morning AND 1 capsule (300 mg) daily AND 2 capsules (600 mg) every evening.    hydroCHLOROthiazide (HYDRODIURIL) 25 MG tablet Take 1 tablet (25 mg) by mouth daily.    ketoconazole (NIZORAL) 2 % shampoo Use shampoo daily for dandruff    lidocaine (LIDOCAINE PAIN RELIEF) 4 % patch Apply 1 patch topically every 24 hours. Leave patch on for 12 hours, then remove for 12 hours.    lisinopril (PRINIVIL, ZESTRIL) 10 MG tablet Take 2 tablets (20 mg) by mouth daily.    magnesium oxide (MAG-OX) 400 MG tablet Take 1 tablet by mouth daily    melatonin (GNP MELATONIN MAXIMUM STRENGTH) 5 MG tablet Take 2 tablets (10 mg) by mouth at bedtime.    Multiple Vitamin (MULTIVITAMIN) TABS tablet Take 1 tablet by mouth daily.    naloxone (KLOXXADO) 8 mg/0.1 mL nasal spray Call 911! Tilt head and spray intranasally into one nostril as needed for respiratory depression. If patient does not respond or responds and then relapses, repeat using a new nasal spray every 3 minutes until emergency medical assistance  arrives.    NEEDLE, DISP, 25 G 25G X   1" MISC Use to inject testosterone    NIFEdipine (ADALAT CC) 30 MG Controlled-Release tablet Take 1 tablet (30 mg) by mouth nightly.    ondansetron (ZOFRAN) 8 MG tablet Take 1 tablet (8 mg) by mouth every 8 hours as needed for Nausea/Vomiting.    oxyCODONE (ROXICODONE) 10 MG tablet Take 1 tab every 4 hours as needed for moderate pain and 2 tabs every 4 hours as needed for severe pain. Max 10 tabs per day, 28 day supply    phenazopyridine (PYRIDIUM) 100 MG tablet Take 1 tablet (100 mg) by mouth 3 times daily.    polyethylene glycol (GLYCOLAX) 17 GM/SCOOP powder Mix 17 grams in 4-8 oz of liquide and drink by mouth daily as needed (Constipation).    pravastatin (PRAVACHOL) 40 MG tablet Take 1 tablet (40 mg) by mouth every evening.    senna (SENOKOT) 8.6 MG tablet Take 1 tablet (8.6 mg) by mouth daily.    sirolimus (RAPAMUNE) 1 MG tablet Take 2 tablets (2 mg) by mouth every morning.    SYRINGE-NEEDLE, DISP, 3 ML (B-D 3CC LUER-LOK SYR 25GX1") 25G X 1" 3 ML MISC Use as directed to inject testosterone    SYRINGE-NEEDLE, DISP, 3 ML 18G X 1-1/2" 3 ML MISC Use to draw up testosterone    tacrolimus (ENVARSUS XR) 1 MG tablet STOP TAKING since 12/31/21 - remaining on chart for dose adjustments, titratable med.    tacrolimus (ENVARSUS XR) 4 MG tablet Take 1 tablet (4 mg) by mouth every morning.    tamsulosin (FLOMAX) 0.4 MG capsule Take 1 capsule (0.4 mg) by mouth daily.    tamsulosin (FLOMAX) 0.4 MG capsule Take 1 capsule (0.4 mg) by mouth daily.    testosterone cypionate (DEPO-TESTOSTERONE) 200 MG/ML SOLN Inject 1 ml into the muscle every 14 days    traZODone (DESYREL) 50 MG tablet Take 1 tablet (50 mg) by mouth nightly.     Current Facility-Administered Medications   Medication    diphenhydrAMINE (BENADRYL) injection 50 mg    diphenhydrAMINE (BENADRYL) tablet 50 mg     Immunization History   Administered Date(s) Administered    COVID-19 (Moderna) Low Dose Red Cap >= 18 Years 05/31/2020     COVID-19 (Moderna) Red Cap >= 12 Years 07/08/2019, 08/07/2019, 12/06/2019    Hep-A/Hep-B; Twinrix, Adult 07/01/2020    Influenza Vaccine (High Dose) Quadrivalent >=65 Years 02/21/2020    Influenza Vaccine (Unspecified) 12/19/2016    Influenza Vaccine >=6 Months 03/03/2010, 03/03/2011, 04/28/2012, 01/24/2014, 01/07/2018, 01/23/2019    Pneumococcal 13 Vaccine (PREVNAR-13) 02/21/2020    Pneumococcal 23 Vaccine (PNEUMOVAX-23) 03/03/2013, 07/01/2020    Tdap 04/21/2011   Deferred Date(s) Deferred    Pneumococcal 23 Vaccine (PNEUMOVAX-23) 06/22/2019     Physical Exam:  BP 102/69 (BP Location: Right arm, BP Patient Position: Sitting, BP cuff size: Large)   Pulse 98   Temp 98.5 F (36.9 C) (Temporal)   Resp 16   Ht 5' 10" (1.778 m)   Wt 96.2 kg (212 lb)   SpO2 97%   BMI 30.42 kg/m      General Appearance: ***alert, no distress, pleasant affect, cooperative.  Heart:  JVD ***, PMI ***, normal rate and regular rhythm, no murmurs, clicks, or gallops. ***  Lungs: ***clear to auscultation and percussion. No rales, rhonchi, or wheezes noted. No chest deformities noted.  Abdomen: ***BS normal.  Abdomen soft, non-tender.  No masses or organomegaly.  Extremities:  ***no cyanosis, clubbing, or edema. Has 2+ peripheral pulses.        Lab Data:  Lab Results   Component Value Date    BUN 26 (H) 12/31/2021    CREAT 1.98 (H) 12/31/2021    CL 99 12/31/2021    NA 140 12/31/2021    K 4.4 12/31/2021    CA 9.2 12/31/2021    TBILI 0.47 12/31/2021    ALB 4.1 12/31/2021    TP 7.1 12/31/2021    AST 22 12/31/2021    ALK 76 12/31/2021    BICARB 29 12/31/2021    ALT 25 12/31/2021    GLU 126 (H) 12/31/2021     Lab Results   Component Value Date    WBC 7.9 12/31/2021    RBC 5.50 12/31/2021    HGB 15.2 12/31/2021    HCT 46.5 12/31/2021    MCV 84.5 12/31/2021    MCHC 32.7 12/31/2021    RDW 12.3 12/31/2021    PLT 162 12/31/2021    MPV 11.6 12/31/2021     Lab Results   Component Value Date    A1C 5.7 05/30/2021     Lab Results   Component Value Date     TSH 1.63 05/30/2021     Lab Results   Component Value Date    CHOL 105 05/30/2021    HDL 38 05/30/2021    LDLCALC 43 05/30/2021    TRIG 121 05/30/2021     Lab Results   Component Value Date    SIROT 11.5 12/31/2021     Lab Results   Component Value Date    FKTR 6.5 12/31/2021     No results found for: CSATR  Lab Results   Component Value Date    CMVPL Not Detected 03/21/2021     Lab Results   Component Value Date    DSA ABSENT 12/31/2021       Prior Cardiovascular Studies:   Lab Results   Component Value Date    LV Ejection Fraction 59 06/26/2021          Echo 06/26/21  Summary:   1. The left ventricular size is normal. The left ventricular systolic function is normal.   2. No left ventricular hypertrophy.   3. Normal pattern of left ventricular diastolic filling.   4. EF=59%.   5. Compared to prior study EF now 59%, was 69% 07/19/20.     LHC/IVUS 06/24/21  CONCLUSION:                                                                   1. Myocardial bridging with mild systolic compression of the mid segment    of the left anterior descending coronary artery.                              2. No angiographic evidence of coronary artery disease.                      3. Intimal thickness noted in LAD/LM up to 0.5 mm (Stable to slightly       worse compare to 2022).                                                         4. Non significant FFR at apical LAD.                                        5. Left ventricular end diastolic pressure appears normal.        Assessment summary:  82 year old female with end-stage HFrEF 2/2 NICM s/p OHT 06/09/19, history of 2R, HTN, HLD and anxiety coming in for f/u of heart transplant.    Assessment/Plan:  # Hematuria  # Dysuria  # Chronic pain  Assessment: We had a long frank discussion about patient's chronic pain issues and the heart transplant team's role in this. I discussed with him that when I initially agreed to cover his chronic opiate prescription, this was the assumption that he would  have a provider versed in chronic pain after 3-4 months, but we are at 6 months and has unable to find one. Additionally, I had not put him on a pain contract at that time, but he recently used more opiates without asking and I informed him this was not appropriate, but because he had not established guidelines I was not going to stop at this time. However, going forward until he can establish with a pain physician, we will set up a pain contract and he will need to follow through like a usual pain clinic with us with goal of provider in 3-4 months or I may start tapering. I will augment adjuvant agents additionally for now and we can continue to work on this.  Plan:  -pain contract signed  -urine tox monthly  -clinic follow up month  -oxycodone 10 mg tablets PO, 1 tab every 4 hours moderate pain, 2 tabs every 4 hours for severe pain, no more than 10 tablets a day, total 280 per 28 days.   -diclofenac cream for joint pain  -lidocaine patch for back pain  -trial of pyridium  -increase gaba at night  -siro change as below  -cymbalta as below    # End-stage heart failure s/p orthotopic heart transplant  # Chronic Immunosuppression/Immunomodulation  Assessment: While we thought continuing sirolimus would help prevent recurrent scar tissue from prostate procedure, it may be exacerbating factors now with delayed wound healing. Will try mmf for 1 month.  Plan:   - continue envarsus 6 mg daily, goal trough 4-8  - HOLD sirolimus 3 mg daily, goal trough 4-8 for at least 1 month  - start mmf 1000 mg bid for one month to allow healing  - Continue to monitor for renal toxicities, infection risk and malignancy risk  - continue pravastatin 40 mg daily  - continue aspirin 81 mg daily    # Hypertension  Assessment: controlled  Plan:  -continue lisinopril 20 mg daily  -resume hctz  -nifedipine 30 mg daily    # Dyslipidemia  -continue pravastatin 40 mg daily    # Depression  Assessment: improved mood  Plan:  -increase cymbalta to 120  mg daily     RTC in 1 month       Nicholas W Wettersten, MD  Advanced Heart Failure, Mechanical Circulatory Support, Transplant  Pgr: [phone]

## 2017-03-04 NOTE — Telephone Encounter (Signed)
See 2nd telephone encounter for 03/04/17

## 2017-03-04 NOTE — Telephone Encounter (Signed)
Please call and let her know her urine culture shows that the bacteria is resistant to the cipro she is on. I've sent over macrobid for her to start instead

## 2017-03-04 NOTE — Telephone Encounter (Signed)
Patient notified.  Confirmed appointment for tomorrow with Dr. Jeananne Rama at 10:45AM

## 2017-03-04 NOTE — Telephone Encounter (Signed)
Copied from [place] [id number]. Topic: Quick Communication - See Telephone Encounter >> Mar 04, 2017  4:23 PM Corie Chiqu[place], [place] wrote: CRM for notification. See Telephone encounter for: Patient medication was sent to [facility] and would like for it to be sent to [facility] in [place]. Patient doesn't know the name of the medication but knows that its for an UTI  03/04/17.

## 2017-03-05 ENCOUNTER — Ambulatory Visit: Payer: Medicare Other | Admitting: Family Medicine

## 2017-03-05 ENCOUNTER — Encounter: Payer: Self-pay | Admitting: Family Medicine

## 2017-03-05 VITALS — BP 92/62 | HR 96 | Temp 98.0°F | Wt 183.2 lb

## 2017-03-05 DIAGNOSIS — D510 Vitamin B12 deficiency anemia due to intrinsic factor deficiency: Secondary | ICD-10-CM | POA: Diagnosis not present

## 2017-03-05 DIAGNOSIS — I951 Orthostatic hypotension: Secondary | ICD-10-CM | POA: Diagnosis not present

## 2017-03-05 DIAGNOSIS — K265 Chronic or unspecified duodenal ulcer with perforation: Secondary | ICD-10-CM | POA: Diagnosis not present

## 2017-03-05 DIAGNOSIS — E1151 Type 2 diabetes mellitus with diabetic peripheral angiopathy without gangrene: Secondary | ICD-10-CM

## 2017-03-05 DIAGNOSIS — I5033 Acute on chronic diastolic (congestive) heart failure: Secondary | ICD-10-CM | POA: Diagnosis not present

## 2017-03-05 DIAGNOSIS — C50412 Malignant neoplasm of upper-outer quadrant of left female breast: Secondary | ICD-10-CM | POA: Diagnosis not present

## 2017-03-05 DIAGNOSIS — K551 Chronic vascular disorders of intestine: Secondary | ICD-10-CM | POA: Diagnosis not present

## 2017-03-05 DIAGNOSIS — E119 Type 2 diabetes mellitus without complications: Secondary | ICD-10-CM | POA: Diagnosis not present

## 2017-03-05 DIAGNOSIS — I10 Essential (primary) hypertension: Secondary | ICD-10-CM

## 2017-03-05 DIAGNOSIS — J449 Chronic obstructive pulmonary disease, unspecified: Secondary | ICD-10-CM | POA: Diagnosis not present

## 2017-03-05 DIAGNOSIS — I214 Non-ST elevation (NSTEMI) myocardial infarction: Secondary | ICD-10-CM | POA: Diagnosis not present

## 2017-03-05 DIAGNOSIS — I4891 Unspecified atrial fibrillation: Secondary | ICD-10-CM | POA: Diagnosis not present

## 2017-03-05 LAB — MICROSCOPIC EXAMINATION: RBC, UA: >30 /hpf — AB (ref 0–?)

## 2017-03-05 LAB — URINE CULTURE, REFLEX

## 2017-03-05 LAB — UA/M W/RFLX CULTURE, ROUTINE
BILIRUBIN UA: NEGATIVE
GLUCOSE, UA: NEGATIVE
KETONES UA: NEGATIVE
Nitrite, UA: NEGATIVE
PROTEIN UA: NEGATIVE
SPEC GRAV UA: 1.01 (ref 1.005–1.030)
Urobilinogen, Ur: 0.2 mg/dL (ref 0.2–1.0)
pH, UA: 5.5 (ref 5.0–7.5)

## 2017-03-05 MED ORDER — MECLIZINE HCL 25 MG PO TABS
25.0000 mg | ORAL_TABLET | Freq: Three times a day (TID) | ORAL | 1 refills | Status: DC | PRN
Start: 2017-03-05 — End: 2017-08-19

## 2017-03-05 MED ORDER — SOLIFENACIN SUCCINATE 10 MG PO TABS: 10.0000 mg | ORAL_TABLET | Freq: Every day | ORAL | 2 refills | Status: AC

## 2017-03-05 MED ORDER — POTASSIUM CHLORIDE CRYS ER 20 MEQ PO TBCR: 20.0000 meq | EXTENDED_RELEASE_TABLET | Freq: Every day | ORAL | 6 refills | Status: DC

## 2017-03-05 NOTE — Assessment & Plan Note (Signed)
The current medical regimen is effective;  continue present plan and medications.  

## 2017-03-05 NOTE — Progress Notes (Signed)
BP 92/62   Pulse 96   Temp 98 F (36.7 C) (Oral)   Wt 183 lb 3.2 oz (83.1 kg)   SpO2 97%   BMI 32.45 kg/m    Subjective:    Patient ID: Mia Padilla, female    DOB: 1934-07-11, 81 y.o.   MRN: [id number]  HPI: Mia Padilla is a 82 y.o. female  Chief Complaint  Patient presents with  . Urinary Tract Infection    3 day f/up  patient all in all much better and brighter. Patient more interactive and aware of medications and updated in our medicine list in her list in particular taking sure losartan is not being taken which the patient has not taken and is been stopped due to her low blood pressure. Patient still hasn't heard from [facility] clinic regarding   And follow-up requested to the hospital. Patient will check with [facility] clinic again for that appointment.   Patient's blood pressures doing okay stomach still a little twinge but otherwise doing okay. Patient using walker without problems and seems to be alert and oriented  Relevant past medical, surgical, family and social history reviewed and updated as indicated. Interim medical history since our last visit reviewed. Allergies and medications reviewed and updated.  Review of Systems  Constitutional: Negative.   Respiratory: Negative.   Cardiovascular: Negative.     Per HPI unless specifically indicated above     Objective:    BP 92/62   Pulse 96   Temp 98 F (36.7 C) (Oral)   Wt 183 lb 3.2 oz (83.1 kg)   SpO2 97%   BMI 32.45 kg/m   Wt Readings from Last 3 Encounters:  03/05/17 183 lb 3.2 oz (83.1 kg)  03/01/17 182 lb 6 oz (82.7 kg)  02/23/17 180 lb (81.6 kg)    Physical Exam  Constitutional: She is oriented to person, place, and time. She appears well-developed and well-nourished.  HENT:  Head: Normocephalic and atraumatic.  Eyes: Conjunctivae and EOM are normal.  Neck: Normal range of motion.  Cardiovascular: Normal rate, regular rhythm and normal heart sounds.  Pulmonary/Chest: Effort normal and  breath sounds normal.  Musculoskeletal: Normal range of motion.  Neurological: She is alert and oriented to person, place, and time.  Skin: No erythema.  Psychiatric: She has a normal mood and affect. Her behavior is normal. Judgment and thought content normal.    Results for orders placed or performed in visit on 03/01/17  Microscopic Examination  Result Value Ref Range   WBC, UA 11-30 (A) 0 - 5 /hpf   RBC, UA >30 (A) 0 - 2 /hpf   Epithelial Cells (non renal) 0-10 0 - 10 /hpf   Bacteria, UA Many (A) None seen/Few  Urine Culture, Reflex  Result Value Ref Range   Urine Culture, Routine Preliminary report (A)    Organism ID, Bacteria Escherichia coli (A)    ORGANISM ID, BACTERIA Comment    Antimicrobial Susceptibility Comment   UA/M w/rflx Culture, Routine  Result Value Ref Range   Specific Gravity, UA 1.010 1.005 - 1.030   pH, UA 5.5 5.0 - 7.5   Color, UA Yellow Yellow   Appearance Ur Turbid (A) Clear   Leukocytes, UA 1+ (A) Negative   Protein, UA Negative Negative/Trace   Glucose, UA Negative Negative   Ketones, UA Negative Negative   RBC, UA 3+ (A) Negative   Bilirubin, UA Negative Negative   Urobilinogen, Ur 0.2 0.2 - 1.0 mg/dL  Nitrite, UA Negative Negative   Microscopic Examination See below:    Urinalysis Reflex Comment       Assessment & Plan:   Problem List Items Addressed This Visit      Cardiovascular and Mediastinum   Hypertension    Reviewed hypertension patient now hypotensive and off losartan will continue off losartan.        Endocrine   Diabetes mellitus ([place]) - Primary    The current medical regimen is effective;  continue present plan and medications.          Abdominal workup in progress with GI which was interrupted for hypertension treated and released from the ER with better oral hydration andeating stopping losartan which is all doing better. Patient has follow-up with GI to further her evaluation. Follow up plan: Return in about 2  months (around 05/05/2017) for BMP,  Lipids, ALT, AST, Hemoglobin A1c.

## 2017-03-05 NOTE — Assessment & Plan Note (Signed)
Reviewed hypertension patient now hypotensive and off losartan will continue off losartan.

## 2017-03-08 DIAGNOSIS — K265 Chronic or unspecified duodenal ulcer with perforation: Secondary | ICD-10-CM | POA: Diagnosis not present

## 2017-03-08 DIAGNOSIS — K551 Chronic vascular disorders of intestine: Secondary | ICD-10-CM | POA: Diagnosis not present

## 2017-03-08 DIAGNOSIS — I5033 Acute on chronic diastolic (congestive) heart failure: Secondary | ICD-10-CM | POA: Diagnosis not present

## 2017-03-08 DIAGNOSIS — D510 Vitamin B12 deficiency anemia due to intrinsic factor deficiency: Secondary | ICD-10-CM | POA: Diagnosis not present

## 2017-03-08 DIAGNOSIS — C50412 Malignant neoplasm of upper-outer quadrant of left female breast: Secondary | ICD-10-CM | POA: Diagnosis not present

## 2017-03-08 DIAGNOSIS — E119 Type 2 diabetes mellitus without complications: Secondary | ICD-10-CM | POA: Diagnosis not present

## 2017-03-08 DIAGNOSIS — J449 Chronic obstructive pulmonary disease, unspecified: Secondary | ICD-10-CM | POA: Diagnosis not present

## 2017-03-08 DIAGNOSIS — I214 Non-ST elevation (NSTEMI) myocardial infarction: Secondary | ICD-10-CM | POA: Diagnosis not present

## 2017-03-08 DIAGNOSIS — I4891 Unspecified atrial fibrillation: Secondary | ICD-10-CM | POA: Diagnosis not present

## 2017-03-08 DIAGNOSIS — I951 Orthostatic hypotension: Secondary | ICD-10-CM | POA: Diagnosis not present

## 2017-03-09 DIAGNOSIS — K265 Chronic or unspecified duodenal ulcer with perforation: Secondary | ICD-10-CM | POA: Diagnosis not present

## 2017-03-09 DIAGNOSIS — J449 Chronic obstructive pulmonary disease, unspecified: Secondary | ICD-10-CM | POA: Diagnosis not present

## 2017-03-09 DIAGNOSIS — I951 Orthostatic hypotension: Secondary | ICD-10-CM | POA: Diagnosis not present

## 2017-03-09 DIAGNOSIS — C50412 Malignant neoplasm of upper-outer quadrant of left female breast: Secondary | ICD-10-CM | POA: Diagnosis not present

## 2017-03-09 DIAGNOSIS — I4891 Unspecified atrial fibrillation: Secondary | ICD-10-CM | POA: Diagnosis not present

## 2017-03-09 DIAGNOSIS — E119 Type 2 diabetes mellitus without complications: Secondary | ICD-10-CM | POA: Diagnosis not present

## 2017-03-09 DIAGNOSIS — D510 Vitamin B12 deficiency anemia due to intrinsic factor deficiency: Secondary | ICD-10-CM | POA: Diagnosis not present

## 2017-03-09 DIAGNOSIS — I5033 Acute on chronic diastolic (congestive) heart failure: Secondary | ICD-10-CM | POA: Diagnosis not present

## 2017-03-09 DIAGNOSIS — I214 Non-ST elevation (NSTEMI) myocardial infarction: Secondary | ICD-10-CM | POA: Diagnosis not present

## 2017-03-09 DIAGNOSIS — K551 Chronic vascular disorders of intestine: Secondary | ICD-10-CM | POA: Diagnosis not present

## 2017-03-10 DIAGNOSIS — I951 Orthostatic hypotension: Secondary | ICD-10-CM | POA: Diagnosis not present

## 2017-03-10 DIAGNOSIS — I5033 Acute on chronic diastolic (congestive) heart failure: Secondary | ICD-10-CM | POA: Diagnosis not present

## 2017-03-10 DIAGNOSIS — K265 Chronic or unspecified duodenal ulcer with perforation: Secondary | ICD-10-CM | POA: Diagnosis not present

## 2017-03-10 DIAGNOSIS — D510 Vitamin B12 deficiency anemia due to intrinsic factor deficiency: Secondary | ICD-10-CM | POA: Diagnosis not present

## 2017-03-10 DIAGNOSIS — K551 Chronic vascular disorders of intestine: Secondary | ICD-10-CM | POA: Diagnosis not present

## 2017-03-10 DIAGNOSIS — J449 Chronic obstructive pulmonary disease, unspecified: Secondary | ICD-10-CM | POA: Diagnosis not present

## 2017-03-10 DIAGNOSIS — E119 Type 2 diabetes mellitus without complications: Secondary | ICD-10-CM | POA: Diagnosis not present

## 2017-03-10 DIAGNOSIS — C50412 Malignant neoplasm of upper-outer quadrant of left female breast: Secondary | ICD-10-CM | POA: Diagnosis not present

## 2017-03-10 DIAGNOSIS — I214 Non-ST elevation (NSTEMI) myocardial infarction: Secondary | ICD-10-CM | POA: Diagnosis not present

## 2017-03-10 DIAGNOSIS — I4891 Unspecified atrial fibrillation: Secondary | ICD-10-CM | POA: Diagnosis not present

## 2017-03-12 DIAGNOSIS — D510 Vitamin B12 deficiency anemia due to intrinsic factor deficiency: Secondary | ICD-10-CM | POA: Diagnosis not present

## 2017-03-12 DIAGNOSIS — I951 Orthostatic hypotension: Secondary | ICD-10-CM | POA: Diagnosis not present

## 2017-03-12 DIAGNOSIS — I4891 Unspecified atrial fibrillation: Secondary | ICD-10-CM | POA: Diagnosis not present

## 2017-03-12 DIAGNOSIS — I5033 Acute on chronic diastolic (congestive) heart failure: Secondary | ICD-10-CM | POA: Diagnosis not present

## 2017-03-12 DIAGNOSIS — J449 Chronic obstructive pulmonary disease, unspecified: Secondary | ICD-10-CM | POA: Diagnosis not present

## 2017-03-12 DIAGNOSIS — K265 Chronic or unspecified duodenal ulcer with perforation: Secondary | ICD-10-CM | POA: Diagnosis not present

## 2017-03-12 DIAGNOSIS — K551 Chronic vascular disorders of intestine: Secondary | ICD-10-CM | POA: Diagnosis not present

## 2017-03-12 DIAGNOSIS — E119 Type 2 diabetes mellitus without complications: Secondary | ICD-10-CM | POA: Diagnosis not present

## 2017-03-12 DIAGNOSIS — C50412 Malignant neoplasm of upper-outer quadrant of left female breast: Secondary | ICD-10-CM | POA: Diagnosis not present

## 2017-03-12 DIAGNOSIS — I214 Non-ST elevation (NSTEMI) myocardial infarction: Secondary | ICD-10-CM | POA: Diagnosis not present

## 2017-03-15 ENCOUNTER — Encounter: Payer: Self-pay | Admitting: Family

## 2017-03-15 ENCOUNTER — Ambulatory Visit: Payer: Medicare Other | Attending: Family | Admitting: Family

## 2017-03-15 ENCOUNTER — Other Ambulatory Visit: Payer: Self-pay

## 2017-03-15 VITALS — BP 139/63 | HR 86 | Resp 18 | Ht 63.0 in | Wt 181.1 lb

## 2017-03-15 DIAGNOSIS — R0789 Other chest pain: Secondary | ICD-10-CM | POA: Insufficient documentation

## 2017-03-15 DIAGNOSIS — E785 Hyperlipidemia, unspecified: Secondary | ICD-10-CM | POA: Insufficient documentation

## 2017-03-15 DIAGNOSIS — I10 Essential (primary) hypertension: Secondary | ICD-10-CM

## 2017-03-15 DIAGNOSIS — Z79811 Long term (current) use of aromatase inhibitors: Secondary | ICD-10-CM | POA: Diagnosis not present

## 2017-03-15 DIAGNOSIS — Z888 Allergy status to other drugs, medicaments and biological substances status: Secondary | ICD-10-CM | POA: Insufficient documentation

## 2017-03-15 DIAGNOSIS — Z9012 Acquired absence of left breast and nipple: Secondary | ICD-10-CM | POA: Diagnosis not present

## 2017-03-15 DIAGNOSIS — Z833 Family history of diabetes mellitus: Secondary | ICD-10-CM | POA: Diagnosis not present

## 2017-03-15 DIAGNOSIS — Z79899 Other long term (current) drug therapy: Secondary | ICD-10-CM | POA: Insufficient documentation

## 2017-03-15 DIAGNOSIS — K551 Chronic vascular disorders of intestine: Secondary | ICD-10-CM | POA: Diagnosis not present

## 2017-03-15 DIAGNOSIS — K265 Chronic or unspecified duodenal ulcer with perforation: Secondary | ICD-10-CM | POA: Diagnosis not present

## 2017-03-15 DIAGNOSIS — I4891 Unspecified atrial fibrillation: Secondary | ICD-10-CM | POA: Diagnosis not present

## 2017-03-15 DIAGNOSIS — Z8249 Family history of ischemic heart disease and other diseases of the circulatory system: Secondary | ICD-10-CM | POA: Insufficient documentation

## 2017-03-15 DIAGNOSIS — I5033 Acute on chronic diastolic (congestive) heart failure: Secondary | ICD-10-CM | POA: Diagnosis not present

## 2017-03-15 DIAGNOSIS — I890 Lymphedema, not elsewhere classified: Secondary | ICD-10-CM | POA: Diagnosis not present

## 2017-03-15 DIAGNOSIS — C50412 Malignant neoplasm of upper-outer quadrant of left female breast: Secondary | ICD-10-CM | POA: Insufficient documentation

## 2017-03-15 DIAGNOSIS — D510 Vitamin B12 deficiency anemia due to intrinsic factor deficiency: Secondary | ICD-10-CM | POA: Diagnosis not present

## 2017-03-15 DIAGNOSIS — Z7951 Long term (current) use of inhaled steroids: Secondary | ICD-10-CM | POA: Diagnosis not present

## 2017-03-15 DIAGNOSIS — Z86718 Personal history of other venous thrombosis and embolism: Secondary | ICD-10-CM | POA: Diagnosis not present

## 2017-03-15 DIAGNOSIS — Z794 Long term (current) use of insulin: Secondary | ICD-10-CM | POA: Insufficient documentation

## 2017-03-15 DIAGNOSIS — J449 Chronic obstructive pulmonary disease, unspecified: Secondary | ICD-10-CM | POA: Diagnosis not present

## 2017-03-15 DIAGNOSIS — E119 Type 2 diabetes mellitus without complications: Secondary | ICD-10-CM | POA: Diagnosis not present

## 2017-03-15 DIAGNOSIS — Z886 Allergy status to analgesic agent status: Secondary | ICD-10-CM | POA: Diagnosis not present

## 2017-03-15 DIAGNOSIS — E1151 Type 2 diabetes mellitus with diabetic peripheral angiopathy without gangrene: Secondary | ICD-10-CM

## 2017-03-15 DIAGNOSIS — Z8049 Family history of malignant neoplasm of other genital organs: Secondary | ICD-10-CM | POA: Insufficient documentation

## 2017-03-15 DIAGNOSIS — H409 Unspecified glaucoma: Secondary | ICD-10-CM | POA: Diagnosis not present

## 2017-03-15 DIAGNOSIS — I951 Orthostatic hypotension: Secondary | ICD-10-CM | POA: Diagnosis not present

## 2017-03-15 DIAGNOSIS — E669 Obesity, unspecified: Secondary | ICD-10-CM | POA: Insufficient documentation

## 2017-03-15 DIAGNOSIS — I214 Non-ST elevation (NSTEMI) myocardial infarction: Secondary | ICD-10-CM | POA: Diagnosis not present

## 2017-03-15 DIAGNOSIS — Z885 Allergy status to narcotic agent status: Secondary | ICD-10-CM | POA: Insufficient documentation

## 2017-03-15 DIAGNOSIS — M19049 Primary osteoarthritis, unspecified hand: Secondary | ICD-10-CM | POA: Diagnosis not present

## 2017-03-15 DIAGNOSIS — I110 Hypertensive heart disease with heart failure: Secondary | ICD-10-CM | POA: Insufficient documentation

## 2017-03-15 DIAGNOSIS — I5032 Chronic diastolic (congestive) heart failure: Secondary | ICD-10-CM

## 2017-03-15 DIAGNOSIS — Z6832 Body mass index (BMI) 32.0-32.9, adult: Secondary | ICD-10-CM | POA: Insufficient documentation

## 2017-03-15 DIAGNOSIS — Z87891 Personal history of nicotine dependence: Secondary | ICD-10-CM | POA: Insufficient documentation

## 2017-03-15 DIAGNOSIS — I480 Paroxysmal atrial fibrillation: Secondary | ICD-10-CM

## 2017-03-15 LAB — GLUCOSE, CAPILLARY: GLUCOSE-CAPILLARY: 88 mg/dL (ref 65–99)

## 2017-03-15 MED ORDER — APIXABAN 5 MG PO TABS: 5.0000 mg | ORAL_TABLET | Freq: Two times a day (BID) | ORAL | 3 refills | Status: DC

## 2017-03-15 NOTE — Progress Notes (Signed)
Patient ID: Mia Padilla, female    DOB: 10-27-1934, 82 y.o.   MRN: [id number]  HPI  Mia Padilla is an 82 y/o female with a history of breast cancer, COPD, diabetes, glaucoma, HTN, hyperlipidemia, DVT, atrial fibrillation, remote tobacco use and chronic heart failure.   Echo report from 01/24/17 reviewed and shows an EF of 40% along with mild MR and a PA pressure of 50-55 mm Hg.  Was in the ED 02/22/17 due to hypotension due to hypovolemia. IV fluids were given and she was released home. Admitted 01/24/17 due to perforated duodenal ulcer. IV protonix was given with transition to oral protonix. Surgical and cardiology consults were obtained. Elevated troponin thought to be due to demand ischemia. Discharged home after 7 days. Was in the ED 11/22/16 due to abdominal pain. She was treated and released.   She presents today for a follow-up visit with a chief complaint of moderate fatigue upon minimal exertion. She describes this as chronic in nature having been present for several years with varying levels of severity. She has associated fatigue, chest tightness, edema, dizziness and chronic difficulty sleeping. She denies any chest pain, palpitations or weight gain.   Past Medical History:  Diagnosis Date  . Arthritis    hands  . Black head   . Bowel incontinence   . Breast cancer of upper-outer quadrant of left female breast ([place]) 2015   Left  . Carotid artery stenosis   . Carpal tunnel syndrome   . CHF (congestive heart failure) ([facility])    Pt. states dx about 5-10 years ago  . COPD (chronic obstructive pulmonary disease) ([place])   . Debilitated   . Diabetes mellitus without complication ([place])   . Glaucoma   . History of seizures   . Hyperlipidemia   . Hypertension   . Hypotension   . Obesity   . Urinary incontinence    Past Surgical History:  Procedure Laterality Date  . BOWEL RESECTION  2005  . BREAST SURGERY  08/03/13   left mastectomy  . CARPAL TUNNEL RELEASE Right 30 years   .  MASTECTOMY     left   . PARTIAL HYSTERECTOMY    . TONSILLECTOMY    . UMBILICAL HERNIA REPAIR     Family History  Problem Relation Age of Onset  . Cervical cancer Mother   . Diabetes Mother   . Glaucoma Mother   . Cervical cancer Daughter   . Hypertension Daughter   . Hypertension Father   . Hypertension Sister   . Hypertension Brother   . Hypertension Son   . Diabetes Brother   . HIV Brother   . Breast cancer Neg Hx    Social History   Tobacco Use  . Smoking status: Former Smoker    Last attempt to quit: 04/20/1974    Years since quitting: 42.9  . Smokeless tobacco: Never Used  . Tobacco comment: Quit 1976  Substance Use Topics  . Alcohol use: No   Allergies  Allergen Reactions  . Propoxyphene Other (See Comments)    Other Reaction: Severe Headache  . Advil [Ibuprofen] Other (See Comments)    Makes her heart race  . Darvocet [Propoxyphene N-Acetaminophen]   . Percocet [Oxycodone-Acetaminophen]     hallucination   Prior to Admission medications   Medication Sig Start Date End Date Taking? Authorizing Provider  acetaminophen (TYLENOL) 500 MG tablet Take 500 mg by mouth every 6 (six) hours as needed.   Yes [provider]  albuterol (PROVENTIL HFA;VENTOLIN HFA) 108 (90 Base) MCG/ACT inhaler Inhale 2 puffs into the lungs every 6 (six) hours as needed for wheezing or shortness of breath. 02/09/17  Yes Johnson, Megan P, DO  anastrozole (ARIMIDEX) 1 MG tablet Take 1 tablet (1 mg total) by mouth daily. 06/08/16  Yes Sankar, Andreas Newport, MD  apixaban (ELIQUIS) 5 MG TABS tablet Take 1 tablet (5 mg total) by mouth 2 (two) times daily. 03/15/17  Yes Alisa Graff, FNP  atorvastatin (LIPITOR) 20 MG tablet Take 1 tablet (20 mg total) by mouth daily at 6 PM. 02/09/17  Yes Wynetta Emery, Megan P, DO  blood glucose meter kit and supplies Diagnosis:E11.9 Test glucose twice daily 11/21/14  Yes Johnson, Megan P, DO  brimonidine (ALPHAGAN P) 0.1 % SOLN 2 (two) times daily.    Yes  [provider]  cyanocobalamin (,VITAMIN B-12,) 1000 MCG/ML injection Inject 1 mL (1,000 mcg total) into the muscle every 30 (thirty) days. 07/16/16  Yes Volney American, PA-C  dorzolamide (TRUSOPT) 2 % ophthalmic solution 1 drop 2 (two) times daily.    Yes [provider]  feeding supplement, ENSURE ENLIVE, (ENSURE ENLIVE) LIQD Take 237 mLs by mouth 2 (two) times daily between meals. 01/31/17  Yes Wieting, Richard, MD  fluticasone (FLONASE) 50 MCG/ACT nasal spray Place 2 sprays into both nostrils daily. 10/14/15  Yes Volney American, PA-C  Fluticasone-Salmeterol (ADVAIR) 250-50 MCG/DOSE AEPB Inhale 1 puff into the lungs every 12 (twelve) hours. 08/18/16  Yes Crissman, Jeannette How, MD  furosemide (LASIX) 20 MG tablet Take 1 tablet (20 mg total) by mouth daily. 02/09/17 02/09/18 Yes Johnson, Megan P, DO  glucose blood test strip Use as instructed 11/05/14  Yes Crissman, Jeannette How, MD  latanoprost (XALATAN) 0.005 % ophthalmic solution Place 1 drop into both eyes at bedtime.   Yes [provider]  LEVEMIR FLEXTOUCH 100 UNIT/ML Pen Inject 6 Units into the skin daily at 10 pm. INJECT 6 UNITS PER DAY 01/31/17  Yes Wieting, Richard, MD  levothyroxine (SYNTHROID, LEVOTHROID) 100 MCG tablet Take 1 tablet (100 mcg total) by mouth daily. 11/19/16  Yes [name], MD  meclizine (ANTIVERT) 25 MG tablet Take 1 tablet (25 mg total) 3 (three) times daily as needed by mouth. 03/05/17  Yes Crissman, Jeannette How, MD  metoprolol succinate (TOPROL-XL) 100 MG 24 hr tablet Take 1 tablet (100 mg total) by mouth daily. 08/18/16  Yes Crissman, Jeannette How, MD  mometasone (ELOCON) 0.1 % cream Apply 1 application topically daily. Apply 1-2 times daily for no longer than 2 weeks at a time 11/24/16  Yes Orene Desanctis, Lilia Argue, PA-C  mometasone-formoterol [facility]) 200-5 MCG/ACT AERO Inhale 2 puffs into the lungs 2 (two) times daily. 02/09/17  Yes Johnson, Megan P, DO  mupirocin ointment (BACTROBAN) 2 % Apply 1  application topically 2 (two) times daily. Do not use day in and day out 11/19/16  Yes Crissman, Jeannette How, MD  nitrofurantoin, macrocrystal-monohydrate, (MACROBID) 100 MG capsule Take 1 capsule (100 mg total) 2 (two) times daily by mouth. 03/04/17  Yes Volney American, PA-C  nystatin (MYCOSTATIN/NYSTOP) powder APPY TO THE AFFECTED AREA 4 TIMES DAILY 02/17/16  Yes Johnson, Megan P, DO  Olopatadine HCl (PATADAY) 0.2 % SOLN Apply 1 drop to eye daily.    Yes [provider]  pantoprazole (PROTONIX) 40 MG tablet Take 1 tablet (40 mg total) 2 (two) times daily by mouth. 03/01/17 03/01/18 Yes Volney American, PA-C  potassium chloride SA (K-DUR,KLOR-CON)  20 MEQ tablet Take 1 tablet (20 mEq total) daily by mouth. 03/05/17  Yes Crissman, Jeannette How, MD  solifenacin (VESICARE) 10 MG tablet Take 1 tablet (10 mg total) daily by mouth. 03/05/17  Yes Crissman, Jeannette How, MD    Review of Systems  Constitutional: Positive for fatigue. Negative for appetite change.  HENT: Positive for rhinorrhea. Negative for congestion and sore throat.   Eyes: Positive for visual disturbance (blurry vision).  Respiratory: Positive for chest tightness (soreness) and shortness of breath. Negative for cough.   Cardiovascular: Positive for leg swelling. Negative for chest pain and palpitations.  Gastrointestinal: Negative for abdominal distention and abdominal pain.  Endocrine: Negative.   Genitourinary: Negative.   Musculoskeletal: Positive for arthralgias (both hips hurt). Negative for back pain.  Skin: Negative.   Allergic/Immunologic: Negative.   Neurological: Positive for dizziness. Negative for light-headedness.  Hematological: Negative for adenopathy. Does not bruise/bleed easily.  Psychiatric/Behavioral: Positive for sleep disturbance (sleeping more during the day). Negative for dysphoric mood. The patient is not nervous/anxious.    Vitals:   03/15/17 1110 03/15/17 1116  BP: 139/63   Pulse:  86  Resp: 18    SpO2: 100%   Weight: 181 lb 2 oz (82.2 kg)   Height: _0  (1.6 m)    Wt Readings from Last 3 Encounters:  03/15/17 181 lb 2 oz (82.2 kg)  03/05/17 183 lb 3.2 oz (83.1 kg)  03/01/17 182 lb 6 oz (82.7 kg)    Lab Results  Component Value Date   CREATININE 0.92 02/22/2017   CREATININE 0.77 02/09/2017   CREATININE 1.01 (H) 01/28/2017    Physical Exam  Constitutional: She is oriented to person, place, and time. She appears well-developed and well-nourished.  HENT:  Head: Normocephalic and atraumatic.  Neck: Normal range of motion. Neck supple. No JVD present.  Cardiovascular: Normal rate. An irregular rhythm present.  Pulmonary/Chest: Effort normal. She has no wheezes. She has no rales.  Abdominal: Soft. She exhibits no distension. There is no tenderness.  Musculoskeletal: She exhibits edema (1+ pitting edema in bilateral lower legs). She exhibits no tenderness.  Neurological: She is alert and oriented to person, place, and time.  Skin: Skin is warm and dry.  Psychiatric: She has a normal mood and affect. Her behavior is normal. Thought content normal.  Nursing note and vitals reviewed.  Assessment & Plan:  1: Chronic heart failure with preserved ejection fraction- - NYHA class III - euvolemic today - weighing daily and she was reminded to weigh daily and call for an overnight weight gain of >2 pounds or a weekly weight gain of >5 pounds - weight down 4 pounds since she was last here - not adding salt and says that she lives with her son/daughter-in-law and they don't cook with salt. Reviewed the importance of reading food labels so that she can keep daily sodium intake to <204m.  - saw cardiologist (Rockey Situ 11/10/16 - patient reports receiving her flu vaccine for the season already  2: HTN- - BP looks good today - last saw PCP (Crissman ) 03/05/17 - BMP from 02/22/17 reviewed and shows sodium 133, potassium 4.3 and GFR >60  3: Atrial fibrillaion- - currently rate  controlled at this time - continues on apixaban & metoprolol  4: Diabetes- - glucose in clinic was 88 - continue levemir - does see nephrologist (Candiss Norse  5: Lymphedema- - does elevate her legs - wearing support hose and edema has improved slightly - limited in her ability to  exercise due to vision changes and instability in walking without her walker - should edema continue, could consider lymphapress compression boots  Medication bottles were reviewed.  Return in 3 months or sooner for any questions/problems before then.

## 2017-03-15 NOTE — Patient Instructions (Signed)
Continue weighing daily and call for an overnight weight gain of > 2 pounds or a weekly weight gain of >5 pounds. 

## 2017-03-16 DIAGNOSIS — K551 Chronic vascular disorders of intestine: Secondary | ICD-10-CM | POA: Diagnosis not present

## 2017-03-16 DIAGNOSIS — I4891 Unspecified atrial fibrillation: Secondary | ICD-10-CM | POA: Diagnosis not present

## 2017-03-16 DIAGNOSIS — I5033 Acute on chronic diastolic (congestive) heart failure: Secondary | ICD-10-CM | POA: Diagnosis not present

## 2017-03-16 DIAGNOSIS — D510 Vitamin B12 deficiency anemia due to intrinsic factor deficiency: Secondary | ICD-10-CM | POA: Diagnosis not present

## 2017-03-16 DIAGNOSIS — E119 Type 2 diabetes mellitus without complications: Secondary | ICD-10-CM | POA: Diagnosis not present

## 2017-03-16 DIAGNOSIS — C50412 Malignant neoplasm of upper-outer quadrant of left female breast: Secondary | ICD-10-CM | POA: Diagnosis not present

## 2017-03-16 DIAGNOSIS — I214 Non-ST elevation (NSTEMI) myocardial infarction: Secondary | ICD-10-CM | POA: Diagnosis not present

## 2017-03-16 DIAGNOSIS — K265 Chronic or unspecified duodenal ulcer with perforation: Secondary | ICD-10-CM | POA: Diagnosis not present

## 2017-03-16 DIAGNOSIS — J449 Chronic obstructive pulmonary disease, unspecified: Secondary | ICD-10-CM | POA: Diagnosis not present

## 2017-03-16 DIAGNOSIS — I951 Orthostatic hypotension: Secondary | ICD-10-CM | POA: Diagnosis not present

## 2017-03-17 DIAGNOSIS — I214 Non-ST elevation (NSTEMI) myocardial infarction: Secondary | ICD-10-CM | POA: Diagnosis not present

## 2017-03-17 DIAGNOSIS — D510 Vitamin B12 deficiency anemia due to intrinsic factor deficiency: Secondary | ICD-10-CM | POA: Diagnosis not present

## 2017-03-17 DIAGNOSIS — K551 Chronic vascular disorders of intestine: Secondary | ICD-10-CM | POA: Diagnosis not present

## 2017-03-17 DIAGNOSIS — K265 Chronic or unspecified duodenal ulcer with perforation: Secondary | ICD-10-CM | POA: Diagnosis not present

## 2017-03-17 DIAGNOSIS — I951 Orthostatic hypotension: Secondary | ICD-10-CM | POA: Diagnosis not present

## 2017-03-17 DIAGNOSIS — E119 Type 2 diabetes mellitus without complications: Secondary | ICD-10-CM | POA: Diagnosis not present

## 2017-03-17 DIAGNOSIS — J449 Chronic obstructive pulmonary disease, unspecified: Secondary | ICD-10-CM | POA: Diagnosis not present

## 2017-03-17 DIAGNOSIS — I4891 Unspecified atrial fibrillation: Secondary | ICD-10-CM | POA: Diagnosis not present

## 2017-03-17 DIAGNOSIS — I5033 Acute on chronic diastolic (congestive) heart failure: Secondary | ICD-10-CM | POA: Diagnosis not present

## 2017-03-17 DIAGNOSIS — C50412 Malignant neoplasm of upper-outer quadrant of left female breast: Secondary | ICD-10-CM | POA: Diagnosis not present

## 2017-03-18 DIAGNOSIS — I5033 Acute on chronic diastolic (congestive) heart failure: Secondary | ICD-10-CM | POA: Diagnosis not present

## 2017-03-18 DIAGNOSIS — I214 Non-ST elevation (NSTEMI) myocardial infarction: Secondary | ICD-10-CM | POA: Diagnosis not present

## 2017-03-18 DIAGNOSIS — E119 Type 2 diabetes mellitus without complications: Secondary | ICD-10-CM | POA: Diagnosis not present

## 2017-03-18 DIAGNOSIS — I951 Orthostatic hypotension: Secondary | ICD-10-CM | POA: Diagnosis not present

## 2017-03-18 DIAGNOSIS — K265 Chronic or unspecified duodenal ulcer with perforation: Secondary | ICD-10-CM | POA: Diagnosis not present

## 2017-03-18 DIAGNOSIS — J449 Chronic obstructive pulmonary disease, unspecified: Secondary | ICD-10-CM | POA: Diagnosis not present

## 2017-03-18 DIAGNOSIS — C50412 Malignant neoplasm of upper-outer quadrant of left female breast: Secondary | ICD-10-CM | POA: Diagnosis not present

## 2017-03-18 DIAGNOSIS — D510 Vitamin B12 deficiency anemia due to intrinsic factor deficiency: Secondary | ICD-10-CM | POA: Diagnosis not present

## 2017-03-18 DIAGNOSIS — I4891 Unspecified atrial fibrillation: Secondary | ICD-10-CM | POA: Diagnosis not present

## 2017-03-18 DIAGNOSIS — K551 Chronic vascular disorders of intestine: Secondary | ICD-10-CM | POA: Diagnosis not present

## 2017-03-19 DIAGNOSIS — R600 Localized edema: Secondary | ICD-10-CM | POA: Diagnosis not present

## 2017-03-19 DIAGNOSIS — N281 Cyst of kidney, acquired: Secondary | ICD-10-CM | POA: Diagnosis not present

## 2017-03-19 DIAGNOSIS — N201 Calculus of ureter: Secondary | ICD-10-CM | POA: Diagnosis not present

## 2017-03-19 DIAGNOSIS — N2889 Other specified disorders of kidney and ureter: Secondary | ICD-10-CM | POA: Diagnosis not present

## 2017-03-22 DIAGNOSIS — E119 Type 2 diabetes mellitus without complications: Secondary | ICD-10-CM | POA: Diagnosis not present

## 2017-03-22 DIAGNOSIS — C50412 Malignant neoplasm of upper-outer quadrant of left female breast: Secondary | ICD-10-CM | POA: Diagnosis not present

## 2017-03-22 DIAGNOSIS — I4891 Unspecified atrial fibrillation: Secondary | ICD-10-CM | POA: Diagnosis not present

## 2017-03-22 DIAGNOSIS — K265 Chronic or unspecified duodenal ulcer with perforation: Secondary | ICD-10-CM | POA: Diagnosis not present

## 2017-03-22 DIAGNOSIS — I214 Non-ST elevation (NSTEMI) myocardial infarction: Secondary | ICD-10-CM | POA: Diagnosis not present

## 2017-03-22 DIAGNOSIS — I5033 Acute on chronic diastolic (congestive) heart failure: Secondary | ICD-10-CM | POA: Diagnosis not present

## 2017-03-22 DIAGNOSIS — D510 Vitamin B12 deficiency anemia due to intrinsic factor deficiency: Secondary | ICD-10-CM | POA: Diagnosis not present

## 2017-03-22 DIAGNOSIS — K551 Chronic vascular disorders of intestine: Secondary | ICD-10-CM | POA: Diagnosis not present

## 2017-03-22 DIAGNOSIS — J449 Chronic obstructive pulmonary disease, unspecified: Secondary | ICD-10-CM | POA: Diagnosis not present

## 2017-03-22 DIAGNOSIS — I951 Orthostatic hypotension: Secondary | ICD-10-CM | POA: Diagnosis not present

## 2017-03-23 ENCOUNTER — Ambulatory Visit: Payer: Medicare Other | Admitting: Gastroenterology

## 2017-03-24 DIAGNOSIS — D510 Vitamin B12 deficiency anemia due to intrinsic factor deficiency: Secondary | ICD-10-CM | POA: Diagnosis not present

## 2017-03-24 DIAGNOSIS — I951 Orthostatic hypotension: Secondary | ICD-10-CM | POA: Diagnosis not present

## 2017-03-24 DIAGNOSIS — I4891 Unspecified atrial fibrillation: Secondary | ICD-10-CM | POA: Diagnosis not present

## 2017-03-24 DIAGNOSIS — I5033 Acute on chronic diastolic (congestive) heart failure: Secondary | ICD-10-CM | POA: Diagnosis not present

## 2017-03-24 DIAGNOSIS — E119 Type 2 diabetes mellitus without complications: Secondary | ICD-10-CM | POA: Diagnosis not present

## 2017-03-24 DIAGNOSIS — I214 Non-ST elevation (NSTEMI) myocardial infarction: Secondary | ICD-10-CM | POA: Diagnosis not present

## 2017-03-24 DIAGNOSIS — J449 Chronic obstructive pulmonary disease, unspecified: Secondary | ICD-10-CM | POA: Diagnosis not present

## 2017-03-24 DIAGNOSIS — K265 Chronic or unspecified duodenal ulcer with perforation: Secondary | ICD-10-CM | POA: Diagnosis not present

## 2017-03-24 DIAGNOSIS — K551 Chronic vascular disorders of intestine: Secondary | ICD-10-CM | POA: Diagnosis not present

## 2017-03-24 DIAGNOSIS — C50412 Malignant neoplasm of upper-outer quadrant of left female breast: Secondary | ICD-10-CM | POA: Diagnosis not present

## 2017-03-25 DIAGNOSIS — K265 Chronic or unspecified duodenal ulcer with perforation: Secondary | ICD-10-CM | POA: Diagnosis not present

## 2017-03-25 DIAGNOSIS — I214 Non-ST elevation (NSTEMI) myocardial infarction: Secondary | ICD-10-CM | POA: Diagnosis not present

## 2017-03-25 DIAGNOSIS — K551 Chronic vascular disorders of intestine: Secondary | ICD-10-CM | POA: Diagnosis not present

## 2017-03-25 DIAGNOSIS — E119 Type 2 diabetes mellitus without complications: Secondary | ICD-10-CM | POA: Diagnosis not present

## 2017-03-25 DIAGNOSIS — I5033 Acute on chronic diastolic (congestive) heart failure: Secondary | ICD-10-CM | POA: Diagnosis not present

## 2017-03-25 DIAGNOSIS — D510 Vitamin B12 deficiency anemia due to intrinsic factor deficiency: Secondary | ICD-10-CM | POA: Diagnosis not present

## 2017-03-25 DIAGNOSIS — J449 Chronic obstructive pulmonary disease, unspecified: Secondary | ICD-10-CM | POA: Diagnosis not present

## 2017-03-25 DIAGNOSIS — I4891 Unspecified atrial fibrillation: Secondary | ICD-10-CM | POA: Diagnosis not present

## 2017-03-25 DIAGNOSIS — I951 Orthostatic hypotension: Secondary | ICD-10-CM | POA: Diagnosis not present

## 2017-03-25 DIAGNOSIS — C50412 Malignant neoplasm of upper-outer quadrant of left female breast: Secondary | ICD-10-CM | POA: Diagnosis not present

## 2017-03-26 ENCOUNTER — Encounter: Payer: Self-pay | Admitting: Gastroenterology

## 2017-03-26 ENCOUNTER — Ambulatory Visit: Payer: Medicare Other | Admitting: Gastroenterology

## 2017-03-26 VITALS — BP 97/58 | HR 90 | Ht 63.0 in | Wt 181.6 lb

## 2017-03-26 DIAGNOSIS — R7989 Other specified abnormal findings of blood chemistry: Principal | ICD-10-CM

## 2017-03-26 DIAGNOSIS — R748 Abnormal levels of other serum enzymes: Secondary | ICD-10-CM

## 2017-03-26 DIAGNOSIS — K265 Chronic or unspecified duodenal ulcer with perforation: Secondary | ICD-10-CM | POA: Diagnosis not present

## 2017-03-26 DIAGNOSIS — R778 Other specified abnormalities of plasma proteins: Secondary | ICD-10-CM

## 2017-03-26 NOTE — Progress Notes (Signed)
Mia Antigua, MD [address]  [place]  [place], [place] [address]  Main: [phone]  Fax: [phone]   Primary Care Physician: [name], MD  Primary Gastroenterologist:  Dr. Vonda Padilla  Chief Complaint  Patient presents with  . Follow-up    HPI: Mia Padilla is a 82 y.o. female presents for follow-up for perforated duodenal ulcer.  She is quite interactive today.  Denies any abdominal pain.  Has been eating a soft diet without problems.  Denies nausea vomiting.  Denies any blood in stool or melena.  Denies any emesis.  Is taking Protonix twice daily.    She was admitted from 10/7-10/14 when she presented for abdominal pain was found to have perforated duodenal ulcer and SMA stenosis.  She was seen by Dr. Burt Knack and Dr. Rosana Hoes from surgery.  They elected to manage her conservatively.  Her troponin was elevated at 0.05 , and due to NSTEMI, surgery elected undergo surgical intervention.  She was seen by cardiology due to N STEMI, and was started on heparin drip which was converted to Eliquis.  There are no reported moderately reduced EF and evidence of high filling pressures.  According to their note they do planned on an outpatient myocardial perfusion stress test.  She has not been seen by them outpatient yet.  She has not been seen by surgery outpatient yet either.  CT on admission also showed SMA stenosis for which vascular surgery was consulted.  Their note reported "I reviewed the CTA. The SMA has mild/moderate stenosis and a small filling defect in the distal braches but widely patent beyond this. The Celiac and IMA are widely patent. There is no evidence of mesenteric ischemia. The etiology of her abdominal pain is not the SMA finding. But likely the perforated duodenal ulcer. Nonetheless, Continue Heparin gtt per Cardiac protocol for NSTEMI. Continue Plavix. No Vascular Surgery Intervention recommended."  She saw me in clinic on November 5, however had  low blood pressures and was lethargic and was sent to the ER.  She improved with IV fluids.  CT scan during the ER visit on November 5 reported "Tiny air collections over the right upper quadrant adjacent a postsurgical loop of colon anterior to the liver. These ear collections may be minimal residual free peritoneal air which is improved compared to the previous exam as history states suspected perforated duodenal ulcer managed conservatively. This air could possibly be intraluminal or even due to pneumatosis as it is difficult to distinguish the exact location of these small air collections."  For Dr. Darel Hong, ER providers note from that day, "The read is complicated and was read as possible free air.  I discussed the case with on-call general surgeon Dr. Rosana Hoes who reviewed the images and is very familiar with the patient.  As the patient has no new abdominal pain and is actually feeling improved he feels that this area is actually intraluminal.  The patient's blood pressure has improved with hydration.  She has been encouraged to continue oral intake and she is discharged home in improved condition."  Current Outpatient Medications  Medication Sig Dispense Refill  . acetaminophen (TYLENOL) 500 MG tablet Take 500 mg by mouth every 6 (six) hours as needed.    Marland Kitchen albuterol (PROVENTIL HFA;VENTOLIN HFA) 108 (90 Base) MCG/ACT inhaler Inhale 2 puffs into the lungs every 6 (six) hours as needed for wheezing or shortness of breath. 1 Inhaler 0  . anastrozole (ARIMIDEX) 1 MG tablet Take 1  tablet (1 mg total) by mouth daily. 90 tablet 4  . apixaban (ELIQUIS) 5 MG TABS tablet Take 1 tablet (5 mg total) by mouth 2 (two) times daily. 30 tablet 3  . atorvastatin (LIPITOR) 20 MG tablet Take 1 tablet (20 mg total) by mouth daily at 6 PM. 90 tablet 1  . blood glucose meter kit and supplies Diagnosis:E11.9 Test glucose twice daily 1 each 12  . brimonidine (ALPHAGAN P) 0.1 % SOLN 2 (two) times daily.     .  dorzolamide (TRUSOPT) 2 % ophthalmic solution 1 drop 2 (two) times daily.     . feeding supplement, ENSURE ENLIVE, (ENSURE ENLIVE) LIQD Take 237 mLs by mouth 2 (two) times daily between meals. 60 Bottle 0  . fluticasone (FLONASE) 50 MCG/ACT nasal spray Place 2 sprays into both nostrils daily. 16 g 12  . Fluticasone-Salmeterol (ADVAIR) 250-50 MCG/DOSE AEPB Inhale 1 puff into the lungs every 12 (twelve) hours. 180 each 4  . furosemide (LASIX) 20 MG tablet Take 1 tablet (20 mg total) by mouth daily. 90 tablet 1  . glucose blood test strip Use as instructed 100 each 12  . latanoprost (XALATAN) 0.005 % ophthalmic solution Place 1 drop into both eyes at bedtime.    Marland Kitchen LEVEMIR FLEXTOUCH 100 UNIT/ML Pen Inject 6 Units into the skin daily at 10 pm. INJECT 6 UNITS PER DAY 15 mL 11  . levothyroxine (SYNTHROID, LEVOTHROID) 100 MCG tablet Take 1 tablet (100 mcg total) by mouth daily. 90 tablet 4  . meclizine (ANTIVERT) 25 MG tablet Take 1 tablet (25 mg total) 3 (three) times daily as needed by mouth. 90 tablet 1  . metoprolol succinate (TOPROL-XL) 100 MG 24 hr tablet Take 1 tablet (100 mg total) by mouth daily. 90 tablet 4  . mometasone (ELOCON) 0.1 % cream Apply 1 application topically daily. Apply 1-2 times daily for no longer than 2 weeks at a time 50 g 3  . mometasone-formoterol (DULERA) 200-5 MCG/ACT AERO Inhale 2 puffs into the lungs 2 (two) times daily. 13 g 12  . mupirocin ointment (BACTROBAN) 2 % Apply 1 application topically 2 (two) times daily. Do not use day in and day out 22 g 2  . nitrofurantoin, macrocrystal-monohydrate, (MACROBID) 100 MG capsule     . Olopatadine HCl (PATADAY) 0.2 % SOLN Apply 1 drop to eye daily.     . pantoprazole (PROTONIX) 40 MG tablet Take 1 tablet (40 mg total) 2 (two) times daily by mouth. 180 tablet 1  . potassium chloride SA (K-DUR,KLOR-CON) 20 MEQ tablet Take 1 tablet (20 mEq total) daily by mouth. 30 tablet 6  . solifenacin (VESICARE) 10 MG tablet Take 1 tablet (10 mg  total) daily by mouth. 90 tablet 2   No current facility-administered medications for this visit.     Allergies as of 03/26/2017 - Review Complete 03/26/2017  Allergen Reaction Noted  . Propoxyphene Other (See Comments)   . Advil [ibuprofen] Other (See Comments) 02/11/2016  . Darvocet [propoxyphene n-acetaminophen]  06/04/2011  . Percocet [oxycodone-acetaminophen]  06/04/2011    ROS:  General: Negative for anorexia, weight loss, fever, chills, fatigue, weakness. ENT: Negative for hoarseness, difficulty swallowing , nasal congestion. CV: Negative for chest pain, angina, palpitations, dyspnea on exertion, peripheral edema.  Respiratory: Negative for dyspnea at rest, dyspnea on exertion, cough, sputum, wheezing.  GI: See history of present illness. GU:  Negative for dysuria, hematuria, urinary incontinence, urinary frequency, nocturnal urination.  Endo: Negative for unusual weight change.  Physical Examination:   BP (!) 97/58 (BP Location: Right Arm, Patient Position: Sitting, Cuff Size: Large)   Pulse 90   Ht '5\' 3"'$  (1.6 m)   Wt 181 lb 9.6 oz (82.4 kg)   BMI 32.17 kg/m   General: Well-nourished, well-developed in no acute distress.  Eyes: No icterus. Conjunctivae pink. Mouth: Oropharyngeal mucosa moist and pink , no lesions erythema or exudate. Lungs: Clear to auscultation bilaterally. Non-labored. Heart: Regular rate and rhythm, no murmurs rubs or gallops.  Abdomen: Bowel sounds are normal, nontender, nondistended, no hepatosplenomegaly or masses, no abdominal bruits or hernia , no rebound or guarding.   Extremities: No lower extremity edema. No clubbing or deformities. Neuro: Alert and oriented x 3.  Grossly intact. Skin: Warm and dry, no jaundice.   Psych: Alert and cooperative, normal mood and affect.   Labs: CMP     Component Value Date/Time   NA 133 (L) 02/22/2017 1603   NA 137 02/09/2017 1444   NA 133 (L) 07/27/2013 1537   K 4.3 02/22/2017 1603   K 4.2  07/27/2013 1537   CL 99 (L) 02/22/2017 1603   CL 102 07/27/2013 1537   CO2 27 02/22/2017 1603   CO2 30 07/27/2013 1537   GLUCOSE 125 (H) 02/22/2017 1603   GLUCOSE 104 (H) 07/27/2013 1537   BUN 17 02/22/2017 1603   BUN 13 02/09/2017 1444   BUN 13 07/27/2013 1537   CREATININE 0.92 02/22/2017 1603   CREATININE 0.70 07/27/2013 1537   CALCIUM 9.0 02/22/2017 1603   CALCIUM 9.1 07/27/2013 1537   PROT 8.6 (H) 02/09/2017 1444   ALBUMIN 3.7 02/09/2017 1444   AST 14 02/09/2017 1444   AST 31 05/18/2016 1530   ALT 10 02/09/2017 1444   ALT 17 05/18/2016 1530   ALKPHOS 47 02/09/2017 1444   BILITOT 0.7 02/09/2017 1444   GFRNONAA 57 (L) 02/22/2017 1603   GFRNONAA >60 07/27/2013 1537   GFRAA >60 02/22/2017 1603   GFRAA >60 07/27/2013 1537   Lab Results  Component Value Date   WBC 3.4 (L) 02/22/2017   HGB 12.1 02/22/2017   HCT 36.7 02/22/2017   MCV 96.1 02/22/2017   PLT 128 (L) 02/22/2017    Imaging Studies: No results found.  Assessment and Plan:   Mia Padilla is a 82 y.o. y/o female with perforated duodenal ulcer in early October 2018, conservatively managed by surgery, also NSTEMI during that admission and mild to moderate SMA stenosis on CTA, and today clinically improved with no abdominal pain at this time  -Perforated duodenal ulcer No clinical signs of abdominal pain or perforation at this time Patient is tolerating oral diet without any difficulty or pain after eating Will refer to surgery for follow-up.  Patient's last CT scan was on November 5, that per ER note it was reviewed by Dr. Rosana Hoes and reported air was thought to be intraluminal.  Will defer to surgery for timing of repeat CT scan to reevaluate this area.  In the absence of any clinical signs or symptoms of perforation, will not repeat CT scan at this time as last one was just a month ago. Patient asked to call us or go to the ER, if she develops worsening abdominal pain, nausea vomiting, blood in stool, or any other  reasons for concern.  No signs of GI bleeding present, no abdominal pain present, no indication for EGD at this time given recent perforation, and recent NSTEMI.  Would need cardiac evaluation and clearance prior to  any EGDs.  Endoscopic evaluation at this time would have higher risk than benefits in this patient at this time.  -NSTEMI Will have patient follow-up with Dr. Rockey Situ, her cardiologist, as soon as possible given that outpatient stress testing was planned during the inpatient visit and patient was newly started on anticoagulation  Patient will also need cardiac clearance for endoscopies to evaluate duodenal ulcer and perforation site.  At this time, in the absence of any signs and symptoms of bleeding or abdominal pain or perforation, endoscopic procedures would have higher risks and benefits.  -SMA stenosis Patient was evaluated by vascular surgery as an inpatient  patient has follow-up as an outpatient with them as well No postprandial pain present to point to mesenteric ischemia  We will continue to follow closely  Dr Mia Padilla

## 2017-03-31 DIAGNOSIS — E119 Type 2 diabetes mellitus without complications: Secondary | ICD-10-CM | POA: Diagnosis not present

## 2017-03-31 DIAGNOSIS — C50412 Malignant neoplasm of upper-outer quadrant of left female breast: Secondary | ICD-10-CM | POA: Diagnosis not present

## 2017-03-31 DIAGNOSIS — D510 Vitamin B12 deficiency anemia due to intrinsic factor deficiency: Secondary | ICD-10-CM | POA: Diagnosis not present

## 2017-03-31 DIAGNOSIS — I951 Orthostatic hypotension: Secondary | ICD-10-CM | POA: Diagnosis not present

## 2017-03-31 DIAGNOSIS — I4891 Unspecified atrial fibrillation: Secondary | ICD-10-CM | POA: Diagnosis not present

## 2017-03-31 DIAGNOSIS — J449 Chronic obstructive pulmonary disease, unspecified: Secondary | ICD-10-CM | POA: Diagnosis not present

## 2017-03-31 DIAGNOSIS — I214 Non-ST elevation (NSTEMI) myocardial infarction: Secondary | ICD-10-CM | POA: Diagnosis not present

## 2017-03-31 DIAGNOSIS — K551 Chronic vascular disorders of intestine: Secondary | ICD-10-CM | POA: Diagnosis not present

## 2017-03-31 DIAGNOSIS — I5033 Acute on chronic diastolic (congestive) heart failure: Secondary | ICD-10-CM | POA: Diagnosis not present

## 2017-03-31 DIAGNOSIS — K265 Chronic or unspecified duodenal ulcer with perforation: Secondary | ICD-10-CM | POA: Diagnosis not present

## 2017-04-01 ENCOUNTER — Other Ambulatory Visit: Payer: Self-pay

## 2017-04-01 ENCOUNTER — Telehealth (INDEPENDENT_AMBULATORY_CARE_PROVIDER_SITE_OTHER): Payer: Self-pay | Admitting: Vascular Surgery

## 2017-04-01 DIAGNOSIS — J449 Chronic obstructive pulmonary disease, unspecified: Secondary | ICD-10-CM | POA: Diagnosis not present

## 2017-04-01 DIAGNOSIS — I4891 Unspecified atrial fibrillation: Secondary | ICD-10-CM | POA: Diagnosis not present

## 2017-04-01 DIAGNOSIS — K265 Chronic or unspecified duodenal ulcer with perforation: Secondary | ICD-10-CM | POA: Diagnosis not present

## 2017-04-01 DIAGNOSIS — C50412 Malignant neoplasm of upper-outer quadrant of left female breast: Secondary | ICD-10-CM | POA: Diagnosis not present

## 2017-04-01 DIAGNOSIS — D510 Vitamin B12 deficiency anemia due to intrinsic factor deficiency: Secondary | ICD-10-CM | POA: Diagnosis not present

## 2017-04-01 DIAGNOSIS — E119 Type 2 diabetes mellitus without complications: Secondary | ICD-10-CM | POA: Diagnosis not present

## 2017-04-01 DIAGNOSIS — K551 Chronic vascular disorders of intestine: Secondary | ICD-10-CM | POA: Diagnosis not present

## 2017-04-01 DIAGNOSIS — I214 Non-ST elevation (NSTEMI) myocardial infarction: Secondary | ICD-10-CM | POA: Diagnosis not present

## 2017-04-01 DIAGNOSIS — I5033 Acute on chronic diastolic (congestive) heart failure: Secondary | ICD-10-CM | POA: Diagnosis not present

## 2017-04-01 DIAGNOSIS — I951 Orthostatic hypotension: Secondary | ICD-10-CM | POA: Diagnosis not present

## 2017-04-01 DIAGNOSIS — Z1231 Encounter for screening mammogram for malignant neoplasm of breast: Secondary | ICD-10-CM

## 2017-04-01 NOTE — Telephone Encounter (Signed)
Advised daughter-in-law about appointment with Cardiology for 12/18 @ 2pm.   Contacted Dr. Bunnie Domino office concerning moving vascular appointment forward. Secretary is forwarding notes to Dr. Lucky Cowboy for his evaluation.   Contacted [facility] about follow-up with patient. Waiting for scheduling.

## 2017-04-01 NOTE — Telephone Encounter (Signed)
Received feedback from Dr. Bunnie Domino office concerning moving Ms. Goree's office visit date forward.   Per Dr. Lucky Cowboy the patient's ultrasound doesn't show her to be in danger necessitating an urgent office visit. Dr. Lucky Cowboy recommends contacting [name] that prescribed the patient's Eliquis.   Cardiology has been contacted and the patient is scheduled for an office visit on 12/18.

## 2017-04-02 DIAGNOSIS — I4891 Unspecified atrial fibrillation: Secondary | ICD-10-CM | POA: Diagnosis not present

## 2017-04-02 DIAGNOSIS — K265 Chronic or unspecified duodenal ulcer with perforation: Secondary | ICD-10-CM | POA: Diagnosis not present

## 2017-04-02 DIAGNOSIS — E119 Type 2 diabetes mellitus without complications: Secondary | ICD-10-CM | POA: Diagnosis not present

## 2017-04-02 DIAGNOSIS — I214 Non-ST elevation (NSTEMI) myocardial infarction: Secondary | ICD-10-CM | POA: Diagnosis not present

## 2017-04-02 DIAGNOSIS — C50412 Malignant neoplasm of upper-outer quadrant of left female breast: Secondary | ICD-10-CM | POA: Diagnosis not present

## 2017-04-02 DIAGNOSIS — I951 Orthostatic hypotension: Secondary | ICD-10-CM | POA: Diagnosis not present

## 2017-04-02 DIAGNOSIS — I5033 Acute on chronic diastolic (congestive) heart failure: Secondary | ICD-10-CM | POA: Diagnosis not present

## 2017-04-02 DIAGNOSIS — K551 Chronic vascular disorders of intestine: Secondary | ICD-10-CM | POA: Diagnosis not present

## 2017-04-02 DIAGNOSIS — J449 Chronic obstructive pulmonary disease, unspecified: Secondary | ICD-10-CM | POA: Diagnosis not present

## 2017-04-02 DIAGNOSIS — D510 Vitamin B12 deficiency anemia due to intrinsic factor deficiency: Secondary | ICD-10-CM | POA: Diagnosis not present

## 2017-04-05 ENCOUNTER — Telehealth: Payer: Self-pay | Admitting: Cardiovascular Disease

## 2017-04-05 DIAGNOSIS — I4891 Unspecified atrial fibrillation: Secondary | ICD-10-CM | POA: Diagnosis not present

## 2017-04-05 DIAGNOSIS — I5033 Acute on chronic diastolic (congestive) heart failure: Secondary | ICD-10-CM | POA: Diagnosis not present

## 2017-04-05 DIAGNOSIS — I951 Orthostatic hypotension: Secondary | ICD-10-CM | POA: Diagnosis not present

## 2017-04-05 DIAGNOSIS — K551 Chronic vascular disorders of intestine: Secondary | ICD-10-CM | POA: Diagnosis not present

## 2017-04-05 DIAGNOSIS — E119 Type 2 diabetes mellitus without complications: Secondary | ICD-10-CM | POA: Diagnosis not present

## 2017-04-05 DIAGNOSIS — D510 Vitamin B12 deficiency anemia due to intrinsic factor deficiency: Secondary | ICD-10-CM | POA: Diagnosis not present

## 2017-04-05 DIAGNOSIS — I214 Non-ST elevation (NSTEMI) myocardial infarction: Secondary | ICD-10-CM | POA: Diagnosis not present

## 2017-04-05 DIAGNOSIS — K265 Chronic or unspecified duodenal ulcer with perforation: Secondary | ICD-10-CM | POA: Diagnosis not present

## 2017-04-05 DIAGNOSIS — J449 Chronic obstructive pulmonary disease, unspecified: Secondary | ICD-10-CM | POA: Diagnosis not present

## 2017-04-05 DIAGNOSIS — C50412 Malignant neoplasm of upper-outer quadrant of left female breast: Secondary | ICD-10-CM | POA: Diagnosis not present

## 2017-04-05 NOTE — Telephone Encounter (Signed)
I left a message on Mia Padilla's identified voice mail that the patient sees [name], NP in the CHF clinic. Dx- Chronic congestive heart failure with preserved EF. Most recent EF was 40% by echo on 01/24/17. I asked if she has any further questions to please call the CHF clinic at [phone].

## 2017-04-05 NOTE — Telephone Encounter (Signed)
Whitney from [employer] is calling to confirm patient has been diagnosed with congestive heart failure and if so she would like the ejection fraction percentage Please call Whitney at [phone] ext [phone] to discuss

## 2017-04-06 ENCOUNTER — Ambulatory Visit (INDEPENDENT_AMBULATORY_CARE_PROVIDER_SITE_OTHER): Payer: Medicare Other | Admitting: Physician Assistant

## 2017-04-06 ENCOUNTER — Encounter: Payer: Self-pay | Admitting: Physician Assistant

## 2017-04-06 VITALS — BP 106/60 | HR 87 | Ht 64.0 in | Wt 183.5 lb

## 2017-04-06 DIAGNOSIS — K551 Chronic vascular disorders of intestine: Secondary | ICD-10-CM

## 2017-04-06 DIAGNOSIS — E118 Type 2 diabetes mellitus with unspecified complications: Secondary | ICD-10-CM | POA: Insufficient documentation

## 2017-04-06 DIAGNOSIS — K265 Chronic or unspecified duodenal ulcer with perforation: Secondary | ICD-10-CM

## 2017-04-06 DIAGNOSIS — I959 Hypotension, unspecified: Secondary | ICD-10-CM | POA: Diagnosis not present

## 2017-04-06 DIAGNOSIS — I480 Paroxysmal atrial fibrillation: Secondary | ICD-10-CM | POA: Diagnosis not present

## 2017-04-06 DIAGNOSIS — I10 Essential (primary) hypertension: Secondary | ICD-10-CM

## 2017-04-06 DIAGNOSIS — I248 Other forms of acute ischemic heart disease: Secondary | ICD-10-CM | POA: Diagnosis not present

## 2017-04-06 DIAGNOSIS — I771 Stricture of artery: Secondary | ICD-10-CM

## 2017-04-06 DIAGNOSIS — I5042 Chronic combined systolic (congestive) and diastolic (congestive) heart failure: Secondary | ICD-10-CM | POA: Diagnosis not present

## 2017-04-06 DIAGNOSIS — I739 Peripheral vascular disease, unspecified: Secondary | ICD-10-CM

## 2017-04-06 NOTE — Patient Instructions (Signed)
Medication Instructions:  Your physician recommends that you continue on your current medications as directed. Please refer to the Current Medication list given to you today.   Labwork: BMET, magnesium, CBC  Testing/Procedures: none  Follow-Up: Your physician recommends that you schedule a follow-up appointment in: 3 months with Dr. Rockey Situ.    Any Other Special Instructions Will Be Listed Below (If Applicable).     If you need a refill on your cardiac medications before your next appointment, please call your pharmacy.

## 2017-04-06 NOTE — Progress Notes (Signed)
Cardiology Office Note Date:  04/06/2017  Patient ID:  Mia, Padilla 25-May-1934, MRN [id number] PCP:  [name], MD  Cardiologist:  Dr. Rockey Situ, MD    Chief Complaint: Follow up combined CHF and PAF  History of Present Illness: Mia Padilla is a 82 y.o. female with history of PAF on Eliquis, chronic combined systolic and diastolic CHF, HTN, DM, breast cancer, carotid artery stenosis, and COPD who is being seen for follow up of her combined CHF and Afib.  She was previously admitted to [facility] in 01/2017 with intermittent abdominal pain x 2 months that was worse when needing to have a BM. CTA of the abdomen showed free air in the abdomen with concern for perforated duodenal ulcer along with narrowing of the mesenteric arteries (question of thrombus vs plaque). Troponin was minimally elevated and flat trending with a peak of 0.05. Echo showed an EF of 40%, diffuse hypokinesis, DD, calcified mitral annulus with mild MR, mildly dilated left atrium, normal RV cavity size and systolic function, PASP 01-09 mmHg. Her perforated duodenal ulcer was conservatively managed. She was seen by vascular surgery regarding her mesenteric artery stenosis and felt to have mild to moderate SMA stenosis without ischemia. This was not felt to be the etiology of her abdominal pain and conservative management was advised. She was started on Eliquis given her elevated CHADS2VASc of 7 (CHF, HTN, age x 2, DM, vascular disease, female) and possible SMA thrombus. She was diuresed with improvement in volume status. She followed up with her PCP on 10/23 who ordered a [place] that was performed on 11/2 that showed a small defect of mild severity present in the apex location felt to be 2/2 breast attenuation. Normal study. She followed up with GI on 11/5 and was noted to be hypotensive at 82/51. She was sent to the ED at that time where she had troponin negative x 1, lactic acid 1.3, HGB 12.1, WBC 3.4, PLT 128, Na 133,  K+ 4.3, BUN 17, SCr 0.92, CXR with stable cardiomegaly with aortic atherosclerosis without active diseae. CT abdomen/pelvis showed improved free air in the right upper quadrant (vs intraluminal), bilateral nephrolithiasis and bilateral renal cysts, cholelithiasis, atherosclerosis of the coronary arteries, aortic atherosclerosis and a 6 mm bladder stone. EKG not acute. She was noted to be hypovolemic. Blood pressure improved in the ED to 121/67 with gentle IV fluids. She was seen by Urology on 11/6 for the incidental renal mass with recommended MRI abdomen (not yet completed). Seen by PCP on 11/12 with UTI, treated with ABX. Also, her losartan was discontinued at that time. She saw PCP 11/16, continued to be hypotensive with systolic BP of 92 mmHg. She was seen by the [facility] on 11/26 with BP 139/63, weight 181 pounds (down 4 pounds from initial CHF visit on 10/23). No changes were made. Saw GI 12/7 for follow up with plans for continued conservative management of her perforated duodenal ulcer and referral to surgery for follow up imaging was made.   She comes in doing well today from a cardiac standpoint. No chest pain, SOB, palpitations, nausea, vomiting, dizziness, presyncope, or syncope. Weight has been stable at home ranging from 178-181 on her home scale. She sleeps in a lift chair 2/2 left hip pain from OA. No orthopnea, early satiety, PND, or abdominal distension. Does not apply salt to her food. Complaint with her medications. No BRBPR or melena. Much improved abdominal pain. Tolerating PO intake.  Past Medical History:  Diagnosis Date  . Arthritis    hands  . Black head   . Bowel incontinence   . Breast cancer of upper-outer quadrant of left female breast ([place]) 2015   Left  . Carotid artery stenosis    a. ultrasound 06/2438: 1-02 LICA stenosis, 72-53% RICA stenosis; b. followed by vascular  . Carpal tunnel syndrome   . Chronic combined systolic and diastolic CHF, NYHA class 3  ([place])    a. TTE 10/18: EF 40%, diffuse HK, DD, calcifief mitral annulus with mild MR, mildly dilated left atrium, normal RV cavity size and systolic function, PASP 66-44 mmHg  . COPD (chronic obstructive pulmonary disease) ([place])   . Debilitated   . Diabetes mellitus with complication ([place])   . Glaucoma   . History of seizures   . History of stress test    a. 11/18: small defect of mild severity present in the apex location felt to be 2/2 breast attenuation. Normal study  . Hyperlipidemia   . Hypertension   . Hypotension   . Obesity   . PAF (paroxysmal atrial fibrillation) (HCC)    a. on Eliquis; b. CHADS2VASc 8 (CHF, HTN, age x 2, DM, vascular disease, sex category)  . Perforated duodenal ulcer ([place]) 01/2017   a. conservatively managed  . SMA stenosis ([place])    a. noted 10/18; b. conservatively managed; c. unclear if thrombus vs plaque; d. on Eliquis given PAF and Lipitor  . Urinary incontinence     Past Surgical History:  Procedure Laterality Date  . BOWEL RESECTION  2005  . BREAST SURGERY  08/03/13   left mastectomy  . CARPAL TUNNEL RELEASE Right 30 years   . MASTECTOMY     left   . PARTIAL HYSTERECTOMY    . TONSILLECTOMY    . UMBILICAL HERNIA REPAIR      Current Meds  Medication Sig  . acetaminophen (TYLENOL) 500 MG tablet Take 500 mg by mouth every 6 (six) hours as needed.  Marland Kitchen albuterol (PROVENTIL HFA;VENTOLIN HFA) 108 (90 Base) MCG/ACT inhaler Inhale 2 puffs into the lungs every 6 (six) hours as needed for wheezing or shortness of breath.  . anastrozole (ARIMIDEX) 1 MG tablet Take 1 tablet (1 mg total) by mouth daily.  Marland Kitchen apixaban (ELIQUIS) 5 MG TABS tablet Take 1 tablet (5 mg total) by mouth 2 (two) times daily.  Marland Kitchen atorvastatin (LIPITOR) 20 MG tablet Take 1 tablet (20 mg total) by mouth daily at 6 PM.  . blood glucose meter kit and supplies Diagnosis:E11.9 Test glucose twice daily  . brimonidine (ALPHAGAN P) 0.1 % SOLN 2 (two) times daily.   . dorzolamide (TRUSOPT) 2 %  ophthalmic solution 1 drop 2 (two) times daily.   . feeding supplement, ENSURE ENLIVE, (ENSURE ENLIVE) LIQD Take 237 mLs by mouth 2 (two) times daily between meals.  . fluticasone (FLONASE) 50 MCG/ACT nasal spray Place 2 sprays into both nostrils daily.  . Fluticasone-Salmeterol (ADVAIR) 250-50 MCG/DOSE AEPB Inhale 1 puff into the lungs every 12 (twelve) hours.  . furosemide (LASIX) 20 MG tablet Take 1 tablet (20 mg total) by mouth daily.  Marland Kitchen glucose blood test strip Use as instructed  . latanoprost (XALATAN) 0.005 % ophthalmic solution Place 1 drop into both eyes at bedtime.  Marland Kitchen LEVEMIR FLEXTOUCH 100 UNIT/ML Pen Inject 6 Units into the skin daily at 10 pm. INJECT 6 UNITS PER DAY  . levothyroxine (SYNTHROID, LEVOTHROID) 100 MCG tablet Take 1 tablet (100 mcg total) by  mouth daily.  . meclizine (ANTIVERT) 25 MG tablet Take 1 tablet (25 mg total) 3 (three) times daily as needed by mouth.  . metoprolol succinate (TOPROL-XL) 100 MG 24 hr tablet Take 1 tablet (100 mg total) by mouth daily.  . mometasone (ELOCON) 0.1 % cream Apply 1 application topically daily. Apply 1-2 times daily for no longer than 2 weeks at a time  . mometasone-formoterol (DULERA) 200-5 MCG/ACT AERO Inhale 2 puffs into the lungs 2 (two) times daily.  . mupirocin ointment (BACTROBAN) 2 % Apply 1 application topically 2 (two) times daily. Do not use day in and day out  . nitrofurantoin, macrocrystal-monohydrate, (MACROBID) 100 MG capsule   . Olopatadine HCl (PATADAY) 0.2 % SOLN Apply 1 drop to eye daily.   . pantoprazole (PROTONIX) 40 MG tablet Take 1 tablet (40 mg total) 2 (two) times daily by mouth.  . potassium chloride SA (K-DUR,KLOR-CON) 20 MEQ tablet Take 1 tablet (20 mEq total) daily by mouth.  . solifenacin (VESICARE) 10 MG tablet Take 1 tablet (10 mg total) daily by mouth.    Allergies:   Propoxyphene; Advil [ibuprofen]; Darvocet [propoxyphene n-acetaminophen]; and Percocet [oxycodone-acetaminophen]   Social History:  The  patient  reports that she quit smoking about 42 years ago. she has never used smokeless tobacco. She reports that she does not drink alcohol or use drugs.   Family History:  The patient's family history includes Cervical cancer in her daughter and mother; Diabetes in her brother and mother; Glaucoma in her mother; HIV in her brother; Hypertension in her brother, daughter, father, sister, and son.  ROS:   Review of Systems  Constitutional: Positive for malaise/fatigue. Negative for chills, diaphoresis, fever and weight loss.  HENT: Negative for congestion.   Eyes: Negative for discharge and redness.  Respiratory: Positive for cough. Negative for hemoptysis, sputum production, shortness of breath and wheezing.        Rare cough  Cardiovascular: Positive for leg swelling. Negative for chest pain, palpitations, orthopnea, claudication and PND.  Gastrointestinal: Negative for abdominal pain, blood in stool, constipation, diarrhea, heartburn, melena, nausea and vomiting.  Genitourinary: Negative for hematuria.  Musculoskeletal: Negative for falls and myalgias.  Skin: Negative for rash.  Neurological: Negative for dizziness, tingling, tremors, sensory change, speech change, focal weakness, loss of consciousness and weakness.  Endo/Heme/Allergies: Does not bruise/bleed easily.  Psychiatric/Behavioral: Negative for substance abuse. The patient is not nervous/anxious.   All other systems reviewed and are negative.    PHYSICAL EXAM:  VS:  BP 106/60 (BP Location: Right Arm, Patient Position: Sitting, Cuff Size: Normal)   Pulse 87   Ht '5\' 4"'$  (1.626 m)   Wt 183 lb 8 oz (83.2 kg)   BMI 31.50 kg/m  BMI: Body mass index is 31.5 kg/m.  Physical Exam  Constitutional: She is oriented to person, place, and time. She appears well-developed and well-nourished.  Elderly appearing  HENT:  Head: Normocephalic and atraumatic.  Eyes: Right eye exhibits no discharge. Left eye exhibits no discharge.  Neck:  Normal range of motion. No JVD present.  Cardiovascular: Normal rate, regular rhythm, S1 normal and S2 normal. Exam reveals no distant heart sounds, no friction rub, no midsystolic click and no opening snap.  Murmur heard. High-pitched blowing holosystolic murmur is present with a grade of 1/6 at the apex. Pulmonary/Chest: Effort normal and breath sounds normal. No respiratory distress. She has no decreased breath sounds. She has no wheezes. She has no rales. She exhibits no tenderness.  Abdominal:  Soft. She exhibits no distension. There is no tenderness.  Musculoskeletal: She exhibits edema.  1+ nonpitting edema to the bilateral mid shins with chronic woody appearance noted.   Neurological: She is alert and oriented to person, place, and time.  Skin: Skin is warm and dry. No cyanosis. Nails show no clubbing.  Psychiatric: She has a normal mood and affect. Her speech is normal and behavior is normal. Judgment and thought content normal.     EKG:  Was ordered and interpreted by me today. Shows NSR, 87 bpm, LVH, poor R wave progression, isolated st elevation in lead V2 (unchanged from prior) - reviewed with Dr. Fletcher Anon, MD.   Recent Labs: 05/08/2016: B Natriuretic Peptide 207.0 01/24/2017: TSH 3.197 02/09/2017: ALT 10; Magnesium 1.5 02/22/2017: BUN 17; Creatinine, Ser 0.92; Hemoglobin 12.1; Platelets 128; Potassium 4.3; Sodium 133  05/18/2016: VLDL Chol Calc Piccolo,Waive 19 11/19/2016: Chol/HDL Ratio 3.0 02/09/2017: Cholesterol, Total 94; HDL 37; LDL Calculated 38; Triglycerides 97   CrCl cannot be calculated (Patient's most recent lab result is older than the maximum 21 days allowed.).   Wt Readings from Last 3 Encounters:  04/06/17 183 lb 8 oz (83.2 kg)  03/26/17 181 lb 9.6 oz (82.4 kg)  03/15/17 181 lb 2 oz (82.2 kg)     Other studies reviewed: Additional studies/records reviewed today include: summarized above  ASSESSMENT AND PLAN:  1. Chronic combined CHF: -EF 40% by TTE  01/2017 -NYHA class III -She does not appear volume overloaded at this time -Weight is down 7 pounds by our scale today (190 pounds at office visit here in 10/2016-->183 pounds today) -Continue Toprol XL 100 mg daily and Lasix 20 mg daily with KCl repletion -Has previously been on losartan, though this had to be discontinued 2/2 hypotension -BP today is stable, though on the softer side at 400 mmHg systolic. Her relative hypotension precludes addition of losartan back or addition of spironolactone. She would not likely tolerate Entresto given her issues with hypotension -Look to add ARB or spironolactone in follow up if BP allows (perhaps the [facility] Clinic can assist with this) -CHF education -Recommend close follow up with the [place] [facility]  2. History of elevated troponin/demand ischemia in the setting of perforated duodenal ulcer and acute blood loss anemia: -This was not a NSTEMI  -No chest pain -Has already had outpatient Lexiscan Myoview on 02/19/2017 that was normal (EF unable to be calcualted, though noted to be 40% by TTE in 01/2017) -No plans for further ischemic evaluation at this time given she is asymptomatic  3. PAF: -Currently in NSR with well controlled heart rate -Continue Toprol XL for rate control -Continue Eliquis given elevated CHADS2VASc of 8 (CHF, HTN, age x 2, DM, vascular disease, female)  4. Perforated duodenal ulcer: -Conservative management per GI/surgery -Has been referred to surgery by GI  5. SMA stenosis: -Previously evaluated by vascular surgery with recommendation for conservative management -Cannot rule out thrombus vs plaque -Eliquis as above -Lipitor (LDL of 38 in 01/2017) -Follow up with vascular surgery  6. HTN/hypotension: -BP stable today -Unable to titrate or add evidence based heart failure therapy 2/2 relative hypotension  7. Hypomagnesemia: -Check magnesium level with recommendation to replete to goal > 2.0 as  indicated  8. Carotid artery disease/venous insufficiency: -Followed by vascular -Elevates legs when sitting -Has compression stockings at home, not compliant with therapy    Disposition: F/u with Dr. Rockey Situ in 3 months.   Current medicines are reviewed at length  with the patient today.  The patient did not have any concerns regarding medicines.  Signed, Christell Faith, PA-C 04/06/2017 2:44 PM     [address], [place] [address] [phone]

## 2017-04-07 ENCOUNTER — Telehealth: Payer: Self-pay | Admitting: *Deleted

## 2017-04-07 DIAGNOSIS — C50412 Malignant neoplasm of upper-outer quadrant of left female breast: Secondary | ICD-10-CM | POA: Diagnosis not present

## 2017-04-07 DIAGNOSIS — I5033 Acute on chronic diastolic (congestive) heart failure: Secondary | ICD-10-CM | POA: Diagnosis not present

## 2017-04-07 DIAGNOSIS — I951 Orthostatic hypotension: Secondary | ICD-10-CM | POA: Diagnosis not present

## 2017-04-07 DIAGNOSIS — J449 Chronic obstructive pulmonary disease, unspecified: Secondary | ICD-10-CM | POA: Diagnosis not present

## 2017-04-07 DIAGNOSIS — I4891 Unspecified atrial fibrillation: Secondary | ICD-10-CM | POA: Diagnosis not present

## 2017-04-07 DIAGNOSIS — I214 Non-ST elevation (NSTEMI) myocardial infarction: Secondary | ICD-10-CM | POA: Diagnosis not present

## 2017-04-07 DIAGNOSIS — E119 Type 2 diabetes mellitus without complications: Secondary | ICD-10-CM | POA: Diagnosis not present

## 2017-04-07 DIAGNOSIS — K551 Chronic vascular disorders of intestine: Secondary | ICD-10-CM | POA: Diagnosis not present

## 2017-04-07 DIAGNOSIS — D510 Vitamin B12 deficiency anemia due to intrinsic factor deficiency: Secondary | ICD-10-CM | POA: Diagnosis not present

## 2017-04-07 DIAGNOSIS — R790 Abnormal level of blood mineral: Secondary | ICD-10-CM

## 2017-04-07 DIAGNOSIS — K265 Chronic or unspecified duodenal ulcer with perforation: Secondary | ICD-10-CM | POA: Diagnosis not present

## 2017-04-07 LAB — BASIC METABOLIC PANEL
BUN / CREAT RATIO: 14 (ref 12–28)
BUN: 11 mg/dL (ref 8–27)
CO2: 26 mmol/L (ref 20–29)
CREATININE: 0.77 mg/dL (ref 0.57–1.00)
Calcium: 8.6 mg/dL — ABNORMAL LOW (ref 8.7–10.3)
Chloride: 104 mmol/L (ref 96–106)
GFR, EST AFRICAN AMERICAN: 84 mL/min/{1.73_m2} (ref >59)
GFR, EST NON AFRICAN AMERICAN: 73 mL/min/{1.73_m2} (ref >59)
Glucose: 97 mg/dL (ref 65–99)
POTASSIUM: 4 mmol/L (ref 3.5–5.2)
SODIUM: 143 mmol/L (ref 134–144)

## 2017-04-07 LAB — CBC
HEMATOCRIT: 36 % (ref 34.0–46.6)
Hemoglobin: 11.5 g/dL (ref 11.1–15.9)
MCH: 31.7 pg (ref 26.6–33.0)
MCHC: 31.9 g/dL (ref 31.5–35.7)
MCV: 99 fL — AB (ref 79–97)
PLATELETS: 156 10*3/uL (ref 150–379)
RBC: 3.63 x10E6/uL — ABNORMAL LOW (ref 3.77–5.28)
RDW: 15.4 % (ref 12.3–15.4)
WBC: 3.1 10*3/uL — AB (ref 3.4–10.8)

## 2017-04-07 LAB — MAGNESIUM: Magnesium: 1.3 mg/dL — ABNORMAL LOW (ref 1.6–2.3)

## 2017-04-07 MED ORDER — MAGNESIUM OXIDE 400 MG PO TABS: 400.0000 mg | ORAL_TABLET | Freq: Two times a day (BID) | ORAL | 3 refills | Status: AC

## 2017-04-07 NOTE — Telephone Encounter (Signed)
Results given to daughter, who verbalized understanding of results, to take magnesium oxide and repeat Mg in 1-2 weeks at the [facility].  Orders entered.

## 2017-04-07 NOTE — Telephone Encounter (Signed)
-----   Message from Theora Gianotti, NP sent at 04/07/2017 11:14 AM EST ----- Mg is low.  This appears to be chronic but is slightly worse. She will need Mag-Ox 400 mg BID.  F/u Mg in 1-2 wks. Renal fxn, K, H/H stable.  WBC and Calcium are low, however both appear to be chronic and stable.

## 2017-04-08 ENCOUNTER — Encounter: Payer: Self-pay | Admitting: Surgery

## 2017-04-08 ENCOUNTER — Ambulatory Visit: Payer: Medicare Other | Admitting: Surgery

## 2017-04-08 VITALS — BP 111/66 | HR 101 | Temp 97.6°F | Ht 64.0 in | Wt 184.8 lb

## 2017-04-08 DIAGNOSIS — K30 Functional dyspepsia: Secondary | ICD-10-CM

## 2017-04-08 DIAGNOSIS — K319 Disease of stomach and duodenum, unspecified: Secondary | ICD-10-CM

## 2017-04-08 NOTE — Progress Notes (Signed)
Outpatient Surgical Follow Up  04/08/2017  Mia Padilla is an 82 y.o. female.   RS:[id number] ulcer  HPI: This is a patient who I had seen in the hospital in October for a perforated duodenal ulcer.  It was seen on CT scan and was deemed to be asymptomatic and therefore nonoperative.  She was treated conservatively at the time.  She had had a myocardial infarction and was anticoagulated at the time as well.  Pt has no pain, no wt loss or gain, no n/v, no f/c.  Subsequent to that she has had further workup suggesting superior mesenteric artery stenosis. Pt sees Dr Lucky Cowboy for PAD and SMA stenosis  Past Medical History:  Diagnosis Date  . Arthritis    hands  . Black head   . Bowel incontinence   . Breast cancer ([place]) 11/15/2013  . Breast cancer of upper-outer quadrant of left female breast ([place]) 2015   Left  . Carotid artery stenosis    a. ultrasound 0/9381: 8-29 LICA stenosis, 93-71% RICA stenosis; b. followed by vascular  . Carotid stenosis 11/17/2016  . Carpal tunnel syndrome   . Chronic combined systolic and diastolic CHF, NYHA class 3 ([place])    a. TTE 10/18: EF 40%, diffuse HK, DD, calcifief mitral annulus with mild MR, mildly dilated left atrium, normal RV cavity size and systolic function, PASP 69-67 mmHg  . COPD (chronic obstructive pulmonary disease) ([place])   . Debilitated   . Diabetes mellitus with complication ([place])   . Glaucoma   . History of seizures   . History of stress test    a. 11/18: small defect of mild severity present in the apex location felt to be 2/2 breast attenuation. Normal study  . Hyperlipidemia   . Hypertension   . Hypomagnesemia   . Hypotension   . Hypothyroidism 02/07/2015  . Mesenteric artery stenosis ([place])   . Obese 06/04/2011  . Obesity   . PAF (paroxysmal atrial fibrillation) (HCC)    a. on Eliquis; b. CHADS2VASc 8 (CHF, HTN, age x 2, DM, vascular disease, sex category)  . Perforated duodenal ulcer ([place]) 01/2017   a. conservatively managed  .  Peripheral vascular disease ([place]) 04/23/2015  . SMA stenosis ([place])    a. noted 10/18; b. conservatively managed; c. unclear if thrombus vs plaque; d. on Eliquis given PAF and Lipitor  . Urinary incontinence     Past Surgical History:  Procedure Laterality Date  . BOWEL RESECTION  2005  . BREAST SURGERY  08/03/13   left mastectomy  . CARPAL TUNNEL RELEASE Right 30 years   . MASTECTOMY     left   . PARTIAL HYSTERECTOMY    . TONSILLECTOMY    . UMBILICAL HERNIA REPAIR      Family History  Problem Relation Age of Onset  . Cervical cancer Mother   . Diabetes Mother   . Glaucoma Mother   . Cervical cancer Daughter   . Hypertension Daughter   . Hypertension Father   . Hypertension Sister   . Hypertension Brother   . Hypertension Son   . Diabetes Brother   . HIV Brother   . Breast cancer Neg Hx     Social History:  reports that she quit smoking about 42 years ago. she has never used smokeless tobacco. She reports that she does not drink alcohol or use drugs.  Allergies:  Allergies  Allergen Reactions  . Propoxyphene Other (See Comments)    Other Reaction: Severe Headache  .  Advil [Ibuprofen] Other (See Comments)    Makes her heart race  . Darvocet [Propoxyphene N-Acetaminophen]   . Percocet [Oxycodone-Acetaminophen]     hallucination    Medications reviewed.   Review of Systems:   Review of Systems  Constitutional: Negative for chills, fever and weight loss.  HENT: Negative.   Eyes: Negative.   Respiratory: Negative.   Cardiovascular: Negative.   Gastrointestinal: Negative for abdominal pain, blood in stool, constipation, diarrhea, heartburn, melena, nausea and vomiting.  Genitourinary: Negative.   Musculoskeletal: Negative.   Skin: Negative.   Neurological: Negative.   Endo/Heme/Allergies: Negative.   Psychiatric/Behavioral: Negative.      Physical Exam:  There were no vitals taken for this visit.  Physical Exam  Constitutional: She is oriented to  person, place, and time and well-developed, well-nourished, and in no distress. No distress.  HENT:  Head: Normocephalic and atraumatic.  Eyes: Pupils are equal, round, and reactive to light. Right eye exhibits no discharge. Left eye exhibits no discharge. No scleral icterus.  Neck: No JVD present.  Cardiovascular: Normal rate and regular rhythm.  Pulmonary/Chest: Effort normal. No respiratory distress.  Abdominal: Soft. She exhibits no distension. There is no tenderness. There is no rebound and no guarding.  Musculoskeletal: Normal range of motion. She exhibits no edema or tenderness.  Lymphadenopathy:    She has no cervical adenopathy.  Neurological: She is alert and oriented to person, place, and time.  Skin: Skin is warm and dry. No rash noted. She is not diaphoretic. No erythema.  Psychiatric: Mood and affect normal.  Vitals reviewed.     Results for orders placed or performed in visit on 04/06/17 (from the past 48 hour(s))  Basic Metabolic Panel (BMET)     Status: Abnormal   Collection Time: 04/06/17  3:00 PM  Result Value Ref Range   Glucose 97 65 - 99 mg/dL   BUN 11 8 - 27 mg/dL   Creatinine, Ser 0.77 0.57 - 1.00 mg/dL   GFR calc non Af Amer 73 >59 mL/min/1.73   GFR calc Af Amer 84 >59 mL/min/1.73   BUN/Creatinine Ratio 14 12 - 28   Sodium 143 134 - 144 mmol/L   Potassium 4.0 3.5 - 5.2 mmol/L   Chloride 104 96 - 106 mmol/L   CO2 26 20 - 29 mmol/L   Calcium 8.6 (L) 8.7 - 10.3 mg/dL  Magnesium     Status: Abnormal   Collection Time: 04/06/17  3:00 PM  Result Value Ref Range   Magnesium 1.3 (L) 1.6 - 2.3 mg/dL  CBC     Status: Abnormal   Collection Time: 04/06/17  3:00 PM  Result Value Ref Range   WBC 3.1 (L) 3.4 - 10.8 x10E3/uL   RBC 3.63 (L) 3.77 - 5.28 x10E6/uL   Hemoglobin 11.5 11.1 - 15.9 g/dL   Hematocrit 36.0 34.0 - 46.6 %   MCV 99 (H) 79 - 97 fL   MCH 31.7 26.6 - 33.0 pg   MCHC 31.9 31.5 - 35.7 g/dL   RDW 15.4 12.3 - 15.4 %   Platelets 156 150 - 379  x10E3/uL   No results found.  Assessment/Plan:  This is a patient with a multitude of medical problems who is anticoagulated with coronary artery disease SMA stenosis and a history, remote, of a perforated duodenal ulcer back in October 2018.  Reviewing her laboratory values there is also a question of pancytopenia with depressed white blood cell count hemoglobin hematocrit and even a slightly decreased platelet  count.  Pt is asymptomatic with multiple med problems. Not a very good surgical candidate and risks of ischemia preclude resection in asympt pt.  I thought that the patient would benefit from an EGD to assess healing but Dr. Marius Ditch had seen the patient and did not recommend EGD due to the medical problems.  She will follow-up with [place] on an as needed basis    Florene Glen, MD, FACS

## 2017-04-08 NOTE — Patient Instructions (Addendum)
Please call our office with any questions or concerns. 

## 2017-04-09 DIAGNOSIS — D510 Vitamin B12 deficiency anemia due to intrinsic factor deficiency: Secondary | ICD-10-CM | POA: Diagnosis not present

## 2017-04-09 DIAGNOSIS — K265 Chronic or unspecified duodenal ulcer with perforation: Secondary | ICD-10-CM | POA: Diagnosis not present

## 2017-04-09 DIAGNOSIS — C50412 Malignant neoplasm of upper-outer quadrant of left female breast: Secondary | ICD-10-CM | POA: Diagnosis not present

## 2017-04-09 DIAGNOSIS — I214 Non-ST elevation (NSTEMI) myocardial infarction: Secondary | ICD-10-CM | POA: Diagnosis not present

## 2017-04-09 DIAGNOSIS — I4891 Unspecified atrial fibrillation: Secondary | ICD-10-CM | POA: Diagnosis not present

## 2017-04-09 DIAGNOSIS — J449 Chronic obstructive pulmonary disease, unspecified: Secondary | ICD-10-CM | POA: Diagnosis not present

## 2017-04-09 DIAGNOSIS — K551 Chronic vascular disorders of intestine: Secondary | ICD-10-CM | POA: Diagnosis not present

## 2017-04-09 DIAGNOSIS — E119 Type 2 diabetes mellitus without complications: Secondary | ICD-10-CM | POA: Diagnosis not present

## 2017-04-09 DIAGNOSIS — I951 Orthostatic hypotension: Secondary | ICD-10-CM | POA: Diagnosis not present

## 2017-04-09 DIAGNOSIS — I5033 Acute on chronic diastolic (congestive) heart failure: Secondary | ICD-10-CM | POA: Diagnosis not present

## 2017-04-12 DIAGNOSIS — K265 Chronic or unspecified duodenal ulcer with perforation: Secondary | ICD-10-CM | POA: Diagnosis not present

## 2017-04-12 DIAGNOSIS — J449 Chronic obstructive pulmonary disease, unspecified: Secondary | ICD-10-CM | POA: Diagnosis not present

## 2017-04-12 DIAGNOSIS — I214 Non-ST elevation (NSTEMI) myocardial infarction: Secondary | ICD-10-CM | POA: Diagnosis not present

## 2017-04-12 DIAGNOSIS — I4891 Unspecified atrial fibrillation: Secondary | ICD-10-CM | POA: Diagnosis not present

## 2017-04-12 DIAGNOSIS — K551 Chronic vascular disorders of intestine: Secondary | ICD-10-CM | POA: Diagnosis not present

## 2017-04-12 DIAGNOSIS — D510 Vitamin B12 deficiency anemia due to intrinsic factor deficiency: Secondary | ICD-10-CM | POA: Diagnosis not present

## 2017-04-12 DIAGNOSIS — C50412 Malignant neoplasm of upper-outer quadrant of left female breast: Secondary | ICD-10-CM | POA: Diagnosis not present

## 2017-04-12 DIAGNOSIS — I5033 Acute on chronic diastolic (congestive) heart failure: Secondary | ICD-10-CM | POA: Diagnosis not present

## 2017-04-12 DIAGNOSIS — E119 Type 2 diabetes mellitus without complications: Secondary | ICD-10-CM | POA: Diagnosis not present

## 2017-04-12 DIAGNOSIS — I951 Orthostatic hypotension: Secondary | ICD-10-CM | POA: Diagnosis not present

## 2017-04-14 DIAGNOSIS — K265 Chronic or unspecified duodenal ulcer with perforation: Secondary | ICD-10-CM | POA: Diagnosis not present

## 2017-04-14 DIAGNOSIS — E119 Type 2 diabetes mellitus without complications: Secondary | ICD-10-CM | POA: Diagnosis not present

## 2017-04-14 DIAGNOSIS — I4891 Unspecified atrial fibrillation: Secondary | ICD-10-CM | POA: Diagnosis not present

## 2017-04-14 DIAGNOSIS — I5033 Acute on chronic diastolic (congestive) heart failure: Secondary | ICD-10-CM | POA: Diagnosis not present

## 2017-04-14 DIAGNOSIS — I951 Orthostatic hypotension: Secondary | ICD-10-CM | POA: Diagnosis not present

## 2017-04-14 DIAGNOSIS — I214 Non-ST elevation (NSTEMI) myocardial infarction: Secondary | ICD-10-CM | POA: Diagnosis not present

## 2017-04-14 DIAGNOSIS — C50412 Malignant neoplasm of upper-outer quadrant of left female breast: Secondary | ICD-10-CM | POA: Diagnosis not present

## 2017-04-14 DIAGNOSIS — K551 Chronic vascular disorders of intestine: Secondary | ICD-10-CM | POA: Diagnosis not present

## 2017-04-14 DIAGNOSIS — D510 Vitamin B12 deficiency anemia due to intrinsic factor deficiency: Secondary | ICD-10-CM | POA: Diagnosis not present

## 2017-04-14 DIAGNOSIS — J449 Chronic obstructive pulmonary disease, unspecified: Secondary | ICD-10-CM | POA: Diagnosis not present

## 2017-04-15 ENCOUNTER — Telehealth: Payer: Self-pay

## 2017-04-15 NOTE — Telephone Encounter (Signed)
I spoke with Dorian Pod in Radiology (MRI) and she stated that she could make a note that recent CT scan done to compare notes but could not tell him/her specifics. This has not been scheduled as of yet.

## 2017-04-15 NOTE — Telephone Encounter (Signed)
-----   Message from Virgel Manifold, MD sent at 04/09/2017  3:07 PM EST ----- Jackelyn Poling,  This patient has a future MRI that was ordered by Urology that shows up under imaging. Can you find out from MRI if they have scheduled this already and if so, can you also let them know that her CT scan showed some air around a loop of colon and it could not distinguish between extraluminal and intraluminal air. If the radiologist could pay attention to that when doing the MRI and comment if that finding is still present it would help.

## 2017-04-16 DIAGNOSIS — I951 Orthostatic hypotension: Secondary | ICD-10-CM | POA: Diagnosis not present

## 2017-04-16 DIAGNOSIS — D510 Vitamin B12 deficiency anemia due to intrinsic factor deficiency: Secondary | ICD-10-CM | POA: Diagnosis not present

## 2017-04-16 DIAGNOSIS — I4891 Unspecified atrial fibrillation: Secondary | ICD-10-CM | POA: Diagnosis not present

## 2017-04-16 DIAGNOSIS — I5033 Acute on chronic diastolic (congestive) heart failure: Secondary | ICD-10-CM | POA: Diagnosis not present

## 2017-04-16 DIAGNOSIS — K551 Chronic vascular disorders of intestine: Secondary | ICD-10-CM | POA: Diagnosis not present

## 2017-04-16 DIAGNOSIS — E119 Type 2 diabetes mellitus without complications: Secondary | ICD-10-CM | POA: Diagnosis not present

## 2017-04-16 DIAGNOSIS — I214 Non-ST elevation (NSTEMI) myocardial infarction: Secondary | ICD-10-CM | POA: Diagnosis not present

## 2017-04-16 DIAGNOSIS — C50412 Malignant neoplasm of upper-outer quadrant of left female breast: Secondary | ICD-10-CM | POA: Diagnosis not present

## 2017-04-16 DIAGNOSIS — J449 Chronic obstructive pulmonary disease, unspecified: Secondary | ICD-10-CM | POA: Diagnosis not present

## 2017-04-16 DIAGNOSIS — K265 Chronic or unspecified duodenal ulcer with perforation: Secondary | ICD-10-CM | POA: Diagnosis not present

## 2017-04-16 NOTE — Telephone Encounter (Signed)
Left message for [facility] [phone]. to contact office. (have they or do they plan to do MRI - ordered put in November 2018).

## 2017-04-19 DIAGNOSIS — E119 Type 2 diabetes mellitus without complications: Secondary | ICD-10-CM | POA: Diagnosis not present

## 2017-04-19 DIAGNOSIS — I5033 Acute on chronic diastolic (congestive) heart failure: Secondary | ICD-10-CM | POA: Diagnosis not present

## 2017-04-19 DIAGNOSIS — J449 Chronic obstructive pulmonary disease, unspecified: Secondary | ICD-10-CM | POA: Diagnosis not present

## 2017-04-19 DIAGNOSIS — I4891 Unspecified atrial fibrillation: Secondary | ICD-10-CM | POA: Diagnosis not present

## 2017-04-19 DIAGNOSIS — K265 Chronic or unspecified duodenal ulcer with perforation: Secondary | ICD-10-CM | POA: Diagnosis not present

## 2017-04-19 DIAGNOSIS — K551 Chronic vascular disorders of intestine: Secondary | ICD-10-CM | POA: Diagnosis not present

## 2017-04-19 DIAGNOSIS — D510 Vitamin B12 deficiency anemia due to intrinsic factor deficiency: Secondary | ICD-10-CM | POA: Diagnosis not present

## 2017-04-19 DIAGNOSIS — I214 Non-ST elevation (NSTEMI) myocardial infarction: Secondary | ICD-10-CM | POA: Diagnosis not present

## 2017-04-19 DIAGNOSIS — C50412 Malignant neoplasm of upper-outer quadrant of left female breast: Secondary | ICD-10-CM | POA: Diagnosis not present

## 2017-04-19 DIAGNOSIS — I951 Orthostatic hypotension: Secondary | ICD-10-CM | POA: Diagnosis not present

## 2017-04-21 DIAGNOSIS — I5033 Acute on chronic diastolic (congestive) heart failure: Secondary | ICD-10-CM | POA: Diagnosis not present

## 2017-04-21 DIAGNOSIS — K551 Chronic vascular disorders of intestine: Secondary | ICD-10-CM | POA: Diagnosis not present

## 2017-04-21 DIAGNOSIS — I214 Non-ST elevation (NSTEMI) myocardial infarction: Secondary | ICD-10-CM | POA: Diagnosis not present

## 2017-04-21 DIAGNOSIS — D510 Vitamin B12 deficiency anemia due to intrinsic factor deficiency: Secondary | ICD-10-CM | POA: Diagnosis not present

## 2017-04-21 DIAGNOSIS — K265 Chronic or unspecified duodenal ulcer with perforation: Secondary | ICD-10-CM | POA: Diagnosis not present

## 2017-04-21 DIAGNOSIS — I951 Orthostatic hypotension: Secondary | ICD-10-CM | POA: Diagnosis not present

## 2017-04-21 DIAGNOSIS — I4891 Unspecified atrial fibrillation: Secondary | ICD-10-CM | POA: Diagnosis not present

## 2017-04-21 DIAGNOSIS — J449 Chronic obstructive pulmonary disease, unspecified: Secondary | ICD-10-CM | POA: Diagnosis not present

## 2017-04-21 DIAGNOSIS — C50412 Malignant neoplasm of upper-outer quadrant of left female breast: Secondary | ICD-10-CM | POA: Diagnosis not present

## 2017-04-21 DIAGNOSIS — E119 Type 2 diabetes mellitus without complications: Secondary | ICD-10-CM | POA: Diagnosis not present

## 2017-04-22 DIAGNOSIS — H401133 Primary open-angle glaucoma, bilateral, severe stage: Secondary | ICD-10-CM | POA: Insufficient documentation

## 2017-04-23 ENCOUNTER — Telehealth: Payer: Self-pay | Admitting: Urology

## 2017-04-23 DIAGNOSIS — I5033 Acute on chronic diastolic (congestive) heart failure: Secondary | ICD-10-CM | POA: Diagnosis not present

## 2017-04-23 DIAGNOSIS — D510 Vitamin B12 deficiency anemia due to intrinsic factor deficiency: Secondary | ICD-10-CM | POA: Diagnosis not present

## 2017-04-23 DIAGNOSIS — I951 Orthostatic hypotension: Secondary | ICD-10-CM | POA: Diagnosis not present

## 2017-04-23 DIAGNOSIS — K265 Chronic or unspecified duodenal ulcer with perforation: Secondary | ICD-10-CM | POA: Diagnosis not present

## 2017-04-23 DIAGNOSIS — K551 Chronic vascular disorders of intestine: Secondary | ICD-10-CM | POA: Diagnosis not present

## 2017-04-23 DIAGNOSIS — J449 Chronic obstructive pulmonary disease, unspecified: Secondary | ICD-10-CM | POA: Diagnosis not present

## 2017-04-23 DIAGNOSIS — I214 Non-ST elevation (NSTEMI) myocardial infarction: Secondary | ICD-10-CM | POA: Diagnosis not present

## 2017-04-23 DIAGNOSIS — C50412 Malignant neoplasm of upper-outer quadrant of left female breast: Secondary | ICD-10-CM | POA: Diagnosis not present

## 2017-04-23 DIAGNOSIS — I4891 Unspecified atrial fibrillation: Secondary | ICD-10-CM | POA: Diagnosis not present

## 2017-04-23 DIAGNOSIS — E119 Type 2 diabetes mellitus without complications: Secondary | ICD-10-CM | POA: Diagnosis not present

## 2017-04-23 NOTE — Telephone Encounter (Signed)
[facility] calls and states that pt's MRI was not due until Feb. 2019. Would you like to order the CT scan?

## 2017-04-23 NOTE — Telephone Encounter (Signed)
This is the best imaging modality by far to evaluate the lesion on her kidney.  All other studies would likely be suboptimal.  We can prescribe a Valium if she can have a driver which will likely help her be more comfortable through the procedure.  Please offer her this is a compromise.  Hollice Espy, MD

## 2017-04-23 NOTE — Telephone Encounter (Signed)
Scheduling called to schedule her MRI that you ordered and she stated that they were uncomfortable and that she had tried them before and it hurt when she laid down and she didn't want to do it. Please advise   Sharyn Lull

## 2017-04-26 DIAGNOSIS — J449 Chronic obstructive pulmonary disease, unspecified: Secondary | ICD-10-CM | POA: Diagnosis not present

## 2017-04-26 DIAGNOSIS — I214 Non-ST elevation (NSTEMI) myocardial infarction: Secondary | ICD-10-CM | POA: Diagnosis not present

## 2017-04-26 DIAGNOSIS — I5033 Acute on chronic diastolic (congestive) heart failure: Secondary | ICD-10-CM | POA: Diagnosis not present

## 2017-04-26 DIAGNOSIS — K265 Chronic or unspecified duodenal ulcer with perforation: Secondary | ICD-10-CM | POA: Diagnosis not present

## 2017-04-26 DIAGNOSIS — K551 Chronic vascular disorders of intestine: Secondary | ICD-10-CM | POA: Diagnosis not present

## 2017-04-26 DIAGNOSIS — I4891 Unspecified atrial fibrillation: Secondary | ICD-10-CM | POA: Diagnosis not present

## 2017-04-26 DIAGNOSIS — E119 Type 2 diabetes mellitus without complications: Secondary | ICD-10-CM | POA: Diagnosis not present

## 2017-04-26 DIAGNOSIS — I951 Orthostatic hypotension: Secondary | ICD-10-CM | POA: Diagnosis not present

## 2017-04-26 DIAGNOSIS — C50412 Malignant neoplasm of upper-outer quadrant of left female breast: Secondary | ICD-10-CM | POA: Diagnosis not present

## 2017-04-26 DIAGNOSIS — D510 Vitamin B12 deficiency anemia due to intrinsic factor deficiency: Secondary | ICD-10-CM | POA: Diagnosis not present

## 2017-04-27 MED ORDER — DIAZEPAM 10 MG PO TABS: 10.0000 mg | ORAL_TABLET | Freq: Once | ORAL | 0 refills | Status: AC

## 2017-04-27 NOTE — Telephone Encounter (Signed)
Patient agreed to the MRI and would like the valium called in to her pharmacy if possible.  Thanks, Sharyn Lull

## 2017-04-27 NOTE — Telephone Encounter (Signed)
Valium will have to be picked up from our office, has to be a paper prescription.  She can pick this up at the front desk.  Hollice Espy, MD

## 2017-04-27 NOTE — Addendum Note (Signed)
Addended by: Hollice Espy on: 04/27/2017 12:33 PM   Modules accepted: Orders

## 2017-04-28 ENCOUNTER — Telehealth: Payer: Self-pay | Admitting: Family Medicine

## 2017-04-28 DIAGNOSIS — C50412 Malignant neoplasm of upper-outer quadrant of left female breast: Secondary | ICD-10-CM | POA: Diagnosis not present

## 2017-04-28 DIAGNOSIS — K265 Chronic or unspecified duodenal ulcer with perforation: Secondary | ICD-10-CM | POA: Diagnosis not present

## 2017-04-28 DIAGNOSIS — J449 Chronic obstructive pulmonary disease, unspecified: Secondary | ICD-10-CM | POA: Diagnosis not present

## 2017-04-28 DIAGNOSIS — I5033 Acute on chronic diastolic (congestive) heart failure: Secondary | ICD-10-CM | POA: Diagnosis not present

## 2017-04-28 DIAGNOSIS — I214 Non-ST elevation (NSTEMI) myocardial infarction: Secondary | ICD-10-CM | POA: Diagnosis not present

## 2017-04-28 DIAGNOSIS — I951 Orthostatic hypotension: Secondary | ICD-10-CM | POA: Diagnosis not present

## 2017-04-28 DIAGNOSIS — E119 Type 2 diabetes mellitus without complications: Secondary | ICD-10-CM | POA: Diagnosis not present

## 2017-04-28 DIAGNOSIS — D510 Vitamin B12 deficiency anemia due to intrinsic factor deficiency: Secondary | ICD-10-CM | POA: Diagnosis not present

## 2017-04-28 DIAGNOSIS — I4891 Unspecified atrial fibrillation: Secondary | ICD-10-CM | POA: Diagnosis not present

## 2017-04-28 DIAGNOSIS — K551 Chronic vascular disorders of intestine: Secondary | ICD-10-CM | POA: Diagnosis not present

## 2017-04-28 NOTE — Telephone Encounter (Signed)
This is okay to do. Needs to be done in place and not in the office

## 2017-04-28 NOTE — Telephone Encounter (Signed)
Verbal given 

## 2017-04-28 NOTE — Telephone Encounter (Signed)
Copied from [place] [id number]. Topic: Inquiry >> Apr 28, 2017  9:06 AM Malena Catholic I, NT wrote: Reason for CRM:pt need assessment for B 12 Shot every Other Week for 9 weeks for more [employer] @ [phone]

## 2017-04-28 NOTE — Telephone Encounter (Signed)
Lab Results  Component Value Date   VITAMINB12 805 11/11/2015    Last B12 level checked in July 2017, was normal. Please advise.

## 2017-04-29 ENCOUNTER — Encounter: Payer: Self-pay | Admitting: Family Medicine

## 2017-04-30 DIAGNOSIS — I5033 Acute on chronic diastolic (congestive) heart failure: Secondary | ICD-10-CM | POA: Diagnosis not present

## 2017-04-30 DIAGNOSIS — I951 Orthostatic hypotension: Secondary | ICD-10-CM | POA: Diagnosis not present

## 2017-04-30 DIAGNOSIS — K551 Chronic vascular disorders of intestine: Secondary | ICD-10-CM | POA: Diagnosis not present

## 2017-04-30 DIAGNOSIS — J449 Chronic obstructive pulmonary disease, unspecified: Secondary | ICD-10-CM | POA: Diagnosis not present

## 2017-04-30 DIAGNOSIS — K265 Chronic or unspecified duodenal ulcer with perforation: Secondary | ICD-10-CM | POA: Diagnosis not present

## 2017-04-30 DIAGNOSIS — I4891 Unspecified atrial fibrillation: Secondary | ICD-10-CM | POA: Diagnosis not present

## 2017-04-30 DIAGNOSIS — C50412 Malignant neoplasm of upper-outer quadrant of left female breast: Secondary | ICD-10-CM | POA: Diagnosis not present

## 2017-04-30 DIAGNOSIS — E119 Type 2 diabetes mellitus without complications: Secondary | ICD-10-CM | POA: Diagnosis not present

## 2017-04-30 DIAGNOSIS — I214 Non-ST elevation (NSTEMI) myocardial infarction: Secondary | ICD-10-CM | POA: Diagnosis not present

## 2017-04-30 DIAGNOSIS — D510 Vitamin B12 deficiency anemia due to intrinsic factor deficiency: Secondary | ICD-10-CM | POA: Diagnosis not present

## 2017-05-03 DIAGNOSIS — I4891 Unspecified atrial fibrillation: Secondary | ICD-10-CM | POA: Diagnosis not present

## 2017-05-03 DIAGNOSIS — K551 Chronic vascular disorders of intestine: Secondary | ICD-10-CM | POA: Diagnosis not present

## 2017-05-03 DIAGNOSIS — I5033 Acute on chronic diastolic (congestive) heart failure: Secondary | ICD-10-CM | POA: Diagnosis not present

## 2017-05-03 DIAGNOSIS — J449 Chronic obstructive pulmonary disease, unspecified: Secondary | ICD-10-CM | POA: Diagnosis not present

## 2017-05-03 DIAGNOSIS — D510 Vitamin B12 deficiency anemia due to intrinsic factor deficiency: Secondary | ICD-10-CM | POA: Diagnosis not present

## 2017-05-03 DIAGNOSIS — C50412 Malignant neoplasm of upper-outer quadrant of left female breast: Secondary | ICD-10-CM | POA: Diagnosis not present

## 2017-05-03 DIAGNOSIS — I214 Non-ST elevation (NSTEMI) myocardial infarction: Secondary | ICD-10-CM | POA: Diagnosis not present

## 2017-05-03 DIAGNOSIS — K265 Chronic or unspecified duodenal ulcer with perforation: Secondary | ICD-10-CM | POA: Diagnosis not present

## 2017-05-03 DIAGNOSIS — E119 Type 2 diabetes mellitus without complications: Secondary | ICD-10-CM | POA: Diagnosis not present

## 2017-05-03 DIAGNOSIS — I951 Orthostatic hypotension: Secondary | ICD-10-CM | POA: Diagnosis not present

## 2017-05-04 ENCOUNTER — Encounter: Payer: Self-pay | Admitting: Family Medicine

## 2017-05-04 ENCOUNTER — Ambulatory Visit: Payer: Medicare Other | Admitting: Family Medicine

## 2017-05-04 VITALS — BP 104/65 | HR 100

## 2017-05-04 DIAGNOSIS — I6523 Occlusion and stenosis of bilateral carotid arteries: Secondary | ICD-10-CM | POA: Diagnosis not present

## 2017-05-04 DIAGNOSIS — I10 Essential (primary) hypertension: Secondary | ICD-10-CM

## 2017-05-04 DIAGNOSIS — I739 Peripheral vascular disease, unspecified: Secondary | ICD-10-CM

## 2017-05-04 DIAGNOSIS — E118 Type 2 diabetes mellitus with unspecified complications: Secondary | ICD-10-CM | POA: Diagnosis not present

## 2017-05-04 DIAGNOSIS — I480 Paroxysmal atrial fibrillation: Secondary | ICD-10-CM

## 2017-05-04 DIAGNOSIS — C50412 Malignant neoplasm of upper-outer quadrant of left female breast: Secondary | ICD-10-CM | POA: Diagnosis not present

## 2017-05-04 DIAGNOSIS — K265 Chronic or unspecified duodenal ulcer with perforation: Secondary | ICD-10-CM | POA: Diagnosis not present

## 2017-05-04 DIAGNOSIS — J432 Centrilobular emphysema: Secondary | ICD-10-CM | POA: Diagnosis not present

## 2017-05-04 DIAGNOSIS — Z7189 Other specified counseling: Secondary | ICD-10-CM

## 2017-05-04 DIAGNOSIS — I5042 Chronic combined systolic (congestive) and diastolic (congestive) heart failure: Secondary | ICD-10-CM

## 2017-05-04 NOTE — Progress Notes (Signed)
BP 104/65   Pulse 100   SpO2 97%    Subjective:    Patient ID: Mia Padilla, female    DOB: [birth year], 83 y.o.   MRN: [id number]  HPI: Mia Padilla is a 83 y.o. female  DM check and med check Patient accompanied by her daughter who assists with history. Reviewed hospitalization notes.  Reviewed patient's home notes. Discussed reasons and indications for doing MRI.  Patient will consider this testing as she is not a surgical candidate. I discussed palliative care Relevant past medical, surgical, family and social history reviewed and updated as indicated. Interim medical history since our last visit reviewed. Allergies and medications reviewed and updated.  Review of Systems  Constitutional: Positive for activity change and fatigue.  HENT: Negative.   Respiratory: Positive for shortness of breath.   Cardiovascular: Negative.     Per HPI unless specifically indicated above     Objective:    BP 104/65   Pulse 100   SpO2 97%   Wt Readings from Last 3 Encounters:  04/08/17 184 lb 12.8 oz (83.8 kg)  04/06/17 183 lb 8 oz (83.2 kg)  03/26/17 181 lb 9.6 oz (82.4 kg)    Physical Exam  Constitutional: She is oriented to person, place, and time. She appears well-developed and well-nourished.  HENT:  Head: Normocephalic and atraumatic.  Eyes: Conjunctivae and EOM are normal.  Neck: Normal range of motion.  Cardiovascular: Normal rate, regular rhythm and normal heart sounds.  Pulmonary/Chest: Effort normal and breath sounds normal.  Musculoskeletal: Normal range of motion.  Plus dependent edema  Neurological: She is alert and oriented to person, place, and time.  Skin: No erythema.  Psychiatric: She has a normal mood and affect. Her behavior is normal. Judgment and thought content normal.    Results for orders placed or performed in visit on 63/78/58  Basic Metabolic Panel (BMET)  Result Value Ref Range   Glucose 97 65 - 99 mg/dL   BUN 11 8 - 27 mg/dL   Creatinine, Ser  0.77 0.57 - 1.00 mg/dL   GFR calc non Af Amer 73 >59 mL/min/1.73   GFR calc Af Amer 84 >59 mL/min/1.73   BUN/Creatinine Ratio 14 12 - 28   Sodium 143 134 - 144 mmol/L   Potassium 4.0 3.5 - 5.2 mmol/L   Chloride 104 96 - 106 mmol/L   CO2 26 20 - 29 mmol/L   Calcium 8.6 (L) 8.7 - 10.3 mg/dL  Magnesium  Result Value Ref Range   Magnesium 1.3 (L) 1.6 - 2.3 mg/dL  CBC  Result Value Ref Range   WBC 3.1 (L) 3.4 - 10.8 x10E3/uL   RBC 3.63 (L) 3.77 - 5.28 x10E6/uL   Hemoglobin 11.5 11.1 - 15.9 g/dL   Hematocrit 36.0 34.0 - 46.6 %   MCV 99 (H) 79 - 97 fL   MCH 31.7 26.6 - 33.0 pg   MCHC 31.9 31.5 - 35.7 g/dL   RDW 15.4 12.3 - 15.4 %   Platelets 156 150 - 379 x10E3/uL      Assessment & Plan:   Problem List Items Addressed This Visit      Cardiovascular and Mediastinum   Hypertension - Primary   Relevant Orders   Basic metabolic panel   Bayer DCA Hb A1c Waived   LP+ALT+AST Piccolo, Waived   Amb Referral to Palliative Care   Peripheral vascular disease ([place])   Relevant Orders   Amb Referral to Palliative Care   Carotid  stenosis   Relevant Orders   Basic metabolic panel   Bayer DCA Hb A1c Waived   LP+ALT+AST Piccolo, Waived   Amb Referral to Palliative Care   Chronic combined systolic and diastolic CHF, NYHA class 3 (HCC)   Relevant Orders   Amb Referral to Palliative Care   PAF (paroxysmal atrial fibrillation) (HCC)   Relevant Orders   Amb Referral to Palliative Care     Respiratory   COPD (chronic obstructive pulmonary disease) (HCC)   Relevant Orders   Amb Referral to Palliative Care     Digestive   Perforated duodenal ulcer ([place])   Relevant Orders   Amb Referral to Palliative Care     Endocrine   Diabetes mellitus with complication ([place])   Relevant Orders   Basic metabolic panel   Bayer DCA Hb A1c Waived   LP+ALT+AST Piccolo, Waived   Amb Referral to Palliative Care     Other   Malignant neoplasm of upper-outer quadrant of female breast [facility])   Relevant  Orders   Amb Referral to Palliative Care   Advanced care planning/counseling discussion    Discussed with patient and daughter, care planning again. Reviewed and discussed palliative care with patient daughter and feel is a good idea.   Will make that referral.  Discussed not doing testing without considering what will be done with the test results. For example will not test with the results of testing would be surgery as patient is not a surgical candidate.       Multiple medical conditions stable for now and will observe without any major changes discussed leg elevation higher than her nose.  Follow up plan: Return in about 3 months (around 08/02/2017).

## 2017-05-04 NOTE — Assessment & Plan Note (Addendum)
Discussed with patient and daughter, care planning again. Reviewed and discussed palliative care with patient daughter and feel is a good idea.   Will make that referral.  Discussed not doing testing without considering what will be done with the test results. For example will not test with the results of testing would be surgery as patient is not a surgical candidate.

## 2017-05-05 ENCOUNTER — Encounter: Payer: Self-pay | Admitting: Family Medicine

## 2017-05-05 DIAGNOSIS — C50412 Malignant neoplasm of upper-outer quadrant of left female breast: Secondary | ICD-10-CM | POA: Diagnosis not present

## 2017-05-05 DIAGNOSIS — J449 Chronic obstructive pulmonary disease, unspecified: Secondary | ICD-10-CM | POA: Diagnosis not present

## 2017-05-05 DIAGNOSIS — I5033 Acute on chronic diastolic (congestive) heart failure: Secondary | ICD-10-CM | POA: Diagnosis not present

## 2017-05-05 DIAGNOSIS — D510 Vitamin B12 deficiency anemia due to intrinsic factor deficiency: Secondary | ICD-10-CM | POA: Diagnosis not present

## 2017-05-05 DIAGNOSIS — I214 Non-ST elevation (NSTEMI) myocardial infarction: Secondary | ICD-10-CM | POA: Diagnosis not present

## 2017-05-05 DIAGNOSIS — I4891 Unspecified atrial fibrillation: Secondary | ICD-10-CM | POA: Diagnosis not present

## 2017-05-05 DIAGNOSIS — I951 Orthostatic hypotension: Secondary | ICD-10-CM | POA: Diagnosis not present

## 2017-05-05 DIAGNOSIS — E119 Type 2 diabetes mellitus without complications: Secondary | ICD-10-CM | POA: Diagnosis not present

## 2017-05-05 DIAGNOSIS — K551 Chronic vascular disorders of intestine: Secondary | ICD-10-CM | POA: Diagnosis not present

## 2017-05-05 DIAGNOSIS — K265 Chronic or unspecified duodenal ulcer with perforation: Secondary | ICD-10-CM | POA: Diagnosis not present

## 2017-05-05 LAB — LP+ALT+AST PICCOLO, WAIVED
ALT (SGPT) Piccolo, Waived: 17 U/L (ref 10–47)
AST (SGOT) PICCOLO, WAIVED: 22 U/L (ref 11–38)
CHOL/HDL RATIO PICCOLO,WAIVE: 2.6 mg/dL
Cholesterol Piccolo, Waived: 96 mg/dL (ref <200)
HDL CHOL PICCOLO, WAIVED: 37 mg/dL — AB (ref >59)
LDL Chol Calc Piccolo Waived: 46 mg/dL (ref <100)
TRIGLYCERIDES PICCOLO,WAIVED: 66 mg/dL (ref <150)
VLDL Chol Calc Piccolo,Waive: 13 mg/dL (ref <30)

## 2017-05-05 LAB — BASIC METABOLIC PANEL
BUN/Creatinine Ratio: 18 (ref 12–28)
BUN: 16 mg/dL (ref 8–27)
CO2: 22 mmol/L (ref 20–29)
CREATININE: 0.89 mg/dL (ref 0.57–1.00)
Calcium: 8.9 mg/dL (ref 8.7–10.3)
Chloride: 99 mmol/L (ref 96–106)
GFR, EST AFRICAN AMERICAN: 71 mL/min/{1.73_m2} (ref >59)
GFR, EST NON AFRICAN AMERICAN: 61 mL/min/{1.73_m2} (ref >59)
Glucose: 139 mg/dL — ABNORMAL HIGH (ref 65–99)
Potassium: 3.7 mmol/L (ref 3.5–5.2)
SODIUM: 140 mmol/L (ref 134–144)

## 2017-05-05 LAB — BAYER DCA HB A1C WAIVED: HB A1C (BAYER DCA - WAIVED): 6 % (ref <7.0)

## 2017-05-06 DIAGNOSIS — K551 Chronic vascular disorders of intestine: Secondary | ICD-10-CM | POA: Diagnosis not present

## 2017-05-06 DIAGNOSIS — E119 Type 2 diabetes mellitus without complications: Secondary | ICD-10-CM | POA: Diagnosis not present

## 2017-05-06 DIAGNOSIS — C50412 Malignant neoplasm of upper-outer quadrant of left female breast: Secondary | ICD-10-CM | POA: Diagnosis not present

## 2017-05-06 DIAGNOSIS — I951 Orthostatic hypotension: Secondary | ICD-10-CM | POA: Diagnosis not present

## 2017-05-06 DIAGNOSIS — D510 Vitamin B12 deficiency anemia due to intrinsic factor deficiency: Secondary | ICD-10-CM | POA: Diagnosis not present

## 2017-05-06 DIAGNOSIS — I214 Non-ST elevation (NSTEMI) myocardial infarction: Secondary | ICD-10-CM | POA: Diagnosis not present

## 2017-05-06 DIAGNOSIS — I5033 Acute on chronic diastolic (congestive) heart failure: Secondary | ICD-10-CM | POA: Diagnosis not present

## 2017-05-06 DIAGNOSIS — I4891 Unspecified atrial fibrillation: Secondary | ICD-10-CM | POA: Diagnosis not present

## 2017-05-06 DIAGNOSIS — K265 Chronic or unspecified duodenal ulcer with perforation: Secondary | ICD-10-CM | POA: Diagnosis not present

## 2017-05-06 DIAGNOSIS — J449 Chronic obstructive pulmonary disease, unspecified: Secondary | ICD-10-CM | POA: Diagnosis not present

## 2017-05-10 DIAGNOSIS — K265 Chronic or unspecified duodenal ulcer with perforation: Secondary | ICD-10-CM | POA: Diagnosis not present

## 2017-05-10 DIAGNOSIS — J449 Chronic obstructive pulmonary disease, unspecified: Secondary | ICD-10-CM | POA: Diagnosis not present

## 2017-05-10 DIAGNOSIS — C50412 Malignant neoplasm of upper-outer quadrant of left female breast: Secondary | ICD-10-CM | POA: Diagnosis not present

## 2017-05-10 DIAGNOSIS — I951 Orthostatic hypotension: Secondary | ICD-10-CM | POA: Diagnosis not present

## 2017-05-10 DIAGNOSIS — I4891 Unspecified atrial fibrillation: Secondary | ICD-10-CM | POA: Diagnosis not present

## 2017-05-10 DIAGNOSIS — I5033 Acute on chronic diastolic (congestive) heart failure: Secondary | ICD-10-CM | POA: Diagnosis not present

## 2017-05-10 DIAGNOSIS — D510 Vitamin B12 deficiency anemia due to intrinsic factor deficiency: Secondary | ICD-10-CM | POA: Diagnosis not present

## 2017-05-10 DIAGNOSIS — I214 Non-ST elevation (NSTEMI) myocardial infarction: Secondary | ICD-10-CM | POA: Diagnosis not present

## 2017-05-10 DIAGNOSIS — E119 Type 2 diabetes mellitus without complications: Secondary | ICD-10-CM | POA: Diagnosis not present

## 2017-05-10 DIAGNOSIS — K551 Chronic vascular disorders of intestine: Secondary | ICD-10-CM | POA: Diagnosis not present

## 2017-05-12 ENCOUNTER — Telehealth: Payer: Self-pay | Admitting: Cardiovascular Disease

## 2017-05-12 DIAGNOSIS — K265 Chronic or unspecified duodenal ulcer with perforation: Secondary | ICD-10-CM | POA: Diagnosis not present

## 2017-05-12 DIAGNOSIS — I214 Non-ST elevation (NSTEMI) myocardial infarction: Secondary | ICD-10-CM | POA: Diagnosis not present

## 2017-05-12 DIAGNOSIS — C50412 Malignant neoplasm of upper-outer quadrant of left female breast: Secondary | ICD-10-CM | POA: Diagnosis not present

## 2017-05-12 DIAGNOSIS — I951 Orthostatic hypotension: Secondary | ICD-10-CM | POA: Diagnosis not present

## 2017-05-12 DIAGNOSIS — I5033 Acute on chronic diastolic (congestive) heart failure: Secondary | ICD-10-CM | POA: Diagnosis not present

## 2017-05-12 DIAGNOSIS — I4891 Unspecified atrial fibrillation: Secondary | ICD-10-CM | POA: Diagnosis not present

## 2017-05-12 DIAGNOSIS — E119 Type 2 diabetes mellitus without complications: Secondary | ICD-10-CM | POA: Diagnosis not present

## 2017-05-12 DIAGNOSIS — D510 Vitamin B12 deficiency anemia due to intrinsic factor deficiency: Secondary | ICD-10-CM | POA: Diagnosis not present

## 2017-05-12 DIAGNOSIS — K551 Chronic vascular disorders of intestine: Secondary | ICD-10-CM | POA: Diagnosis not present

## 2017-05-12 DIAGNOSIS — J449 Chronic obstructive pulmonary disease, unspecified: Secondary | ICD-10-CM | POA: Diagnosis not present

## 2017-05-12 NOTE — Telephone Encounter (Signed)
Mardene Celeste with [facility] calling for Dr. Gwenyth Ober nurse re patient weight gain and advise prior to leaving patient

## 2017-05-12 NOTE — Telephone Encounter (Signed)
Spoke with Mardene Celeste RN with [facility] and she sees patient once every other week. She reports that the patient has been signed up with [facility] program that will be monitoring her weights with digital scale that uploads readings to them. She reports that patient does not have any signs and symptoms at this time but she is more weak with only walking to and from the door in her home. She reports that she does have some shortness of breath but not any more than normal. She does not see any changes from her baseline at this time. She states that [facility] sent her a new scale and the new one showed increased weight but she does not see any visible changes in patients swelling or breathing. She reports that [facility] will contact us if there are any increases and she will also be measuring her calves to see if there is a increase. She just wanted [place] to know new scales show a different weight for her. She verbalized understanding of our conversation, agreement with plan, and had no further questions at this time.

## 2017-05-12 NOTE — Telephone Encounter (Signed)
Pt c/o swelling: STAT is pt has developed SOB within 24 hours  1) How much weight have you gained and in what time span? today New scale 21  Yesterday old scale 187   2) If swelling, where is the swelling located? Calves tighter than before   3) Are you currently taking a fluid pill? Yes   4) Are you currently SOB? O2 sat 96%  HR 83 sob on exertion   5) Do you have a log of your daily weights (if so, list)?   No list does weigh everyday      6) Have you gained 3 pounds in a day or 5 pounds in a week? Yes not sure if scales or actual gain   7) Have you traveled recently? No

## 2017-05-14 DIAGNOSIS — J449 Chronic obstructive pulmonary disease, unspecified: Secondary | ICD-10-CM | POA: Diagnosis not present

## 2017-05-14 DIAGNOSIS — C50412 Malignant neoplasm of upper-outer quadrant of left female breast: Secondary | ICD-10-CM | POA: Diagnosis not present

## 2017-05-14 DIAGNOSIS — D510 Vitamin B12 deficiency anemia due to intrinsic factor deficiency: Secondary | ICD-10-CM | POA: Diagnosis not present

## 2017-05-14 DIAGNOSIS — I214 Non-ST elevation (NSTEMI) myocardial infarction: Secondary | ICD-10-CM | POA: Diagnosis not present

## 2017-05-14 DIAGNOSIS — E119 Type 2 diabetes mellitus without complications: Secondary | ICD-10-CM | POA: Diagnosis not present

## 2017-05-14 DIAGNOSIS — I951 Orthostatic hypotension: Secondary | ICD-10-CM | POA: Diagnosis not present

## 2017-05-14 DIAGNOSIS — I5033 Acute on chronic diastolic (congestive) heart failure: Secondary | ICD-10-CM | POA: Diagnosis not present

## 2017-05-14 DIAGNOSIS — K265 Chronic or unspecified duodenal ulcer with perforation: Secondary | ICD-10-CM | POA: Diagnosis not present

## 2017-05-14 DIAGNOSIS — I4891 Unspecified atrial fibrillation: Secondary | ICD-10-CM | POA: Diagnosis not present

## 2017-05-14 DIAGNOSIS — K551 Chronic vascular disorders of intestine: Secondary | ICD-10-CM | POA: Diagnosis not present

## 2017-05-17 DIAGNOSIS — D510 Vitamin B12 deficiency anemia due to intrinsic factor deficiency: Secondary | ICD-10-CM | POA: Diagnosis not present

## 2017-05-17 DIAGNOSIS — I5033 Acute on chronic diastolic (congestive) heart failure: Secondary | ICD-10-CM | POA: Diagnosis not present

## 2017-05-17 DIAGNOSIS — I951 Orthostatic hypotension: Secondary | ICD-10-CM | POA: Diagnosis not present

## 2017-05-17 DIAGNOSIS — I4891 Unspecified atrial fibrillation: Secondary | ICD-10-CM | POA: Diagnosis not present

## 2017-05-17 DIAGNOSIS — K265 Chronic or unspecified duodenal ulcer with perforation: Secondary | ICD-10-CM | POA: Diagnosis not present

## 2017-05-17 DIAGNOSIS — I214 Non-ST elevation (NSTEMI) myocardial infarction: Secondary | ICD-10-CM | POA: Diagnosis not present

## 2017-05-17 DIAGNOSIS — K551 Chronic vascular disorders of intestine: Secondary | ICD-10-CM | POA: Diagnosis not present

## 2017-05-17 DIAGNOSIS — C50412 Malignant neoplasm of upper-outer quadrant of left female breast: Secondary | ICD-10-CM | POA: Diagnosis not present

## 2017-05-17 DIAGNOSIS — J449 Chronic obstructive pulmonary disease, unspecified: Secondary | ICD-10-CM | POA: Diagnosis not present

## 2017-05-17 DIAGNOSIS — E119 Type 2 diabetes mellitus without complications: Secondary | ICD-10-CM | POA: Diagnosis not present

## 2017-05-19 DIAGNOSIS — I951 Orthostatic hypotension: Secondary | ICD-10-CM | POA: Diagnosis not present

## 2017-05-19 DIAGNOSIS — J449 Chronic obstructive pulmonary disease, unspecified: Secondary | ICD-10-CM | POA: Diagnosis not present

## 2017-05-19 DIAGNOSIS — I214 Non-ST elevation (NSTEMI) myocardial infarction: Secondary | ICD-10-CM | POA: Diagnosis not present

## 2017-05-19 DIAGNOSIS — C50412 Malignant neoplasm of upper-outer quadrant of left female breast: Secondary | ICD-10-CM | POA: Diagnosis not present

## 2017-05-19 DIAGNOSIS — K551 Chronic vascular disorders of intestine: Secondary | ICD-10-CM | POA: Diagnosis not present

## 2017-05-19 DIAGNOSIS — I4891 Unspecified atrial fibrillation: Secondary | ICD-10-CM | POA: Diagnosis not present

## 2017-05-19 DIAGNOSIS — E119 Type 2 diabetes mellitus without complications: Secondary | ICD-10-CM | POA: Diagnosis not present

## 2017-05-19 DIAGNOSIS — K265 Chronic or unspecified duodenal ulcer with perforation: Secondary | ICD-10-CM | POA: Diagnosis not present

## 2017-05-19 DIAGNOSIS — I5033 Acute on chronic diastolic (congestive) heart failure: Secondary | ICD-10-CM | POA: Diagnosis not present

## 2017-05-19 DIAGNOSIS — D510 Vitamin B12 deficiency anemia due to intrinsic factor deficiency: Secondary | ICD-10-CM | POA: Diagnosis not present

## 2017-05-21 DIAGNOSIS — J449 Chronic obstructive pulmonary disease, unspecified: Secondary | ICD-10-CM | POA: Diagnosis not present

## 2017-05-21 DIAGNOSIS — I214 Non-ST elevation (NSTEMI) myocardial infarction: Secondary | ICD-10-CM | POA: Diagnosis not present

## 2017-05-21 DIAGNOSIS — I4891 Unspecified atrial fibrillation: Secondary | ICD-10-CM | POA: Diagnosis not present

## 2017-05-21 DIAGNOSIS — I5033 Acute on chronic diastolic (congestive) heart failure: Secondary | ICD-10-CM | POA: Diagnosis not present

## 2017-05-21 DIAGNOSIS — C50412 Malignant neoplasm of upper-outer quadrant of left female breast: Secondary | ICD-10-CM | POA: Diagnosis not present

## 2017-05-21 DIAGNOSIS — K265 Chronic or unspecified duodenal ulcer with perforation: Secondary | ICD-10-CM | POA: Diagnosis not present

## 2017-05-21 DIAGNOSIS — K551 Chronic vascular disorders of intestine: Secondary | ICD-10-CM | POA: Diagnosis not present

## 2017-05-21 DIAGNOSIS — I951 Orthostatic hypotension: Secondary | ICD-10-CM | POA: Diagnosis not present

## 2017-05-21 DIAGNOSIS — D510 Vitamin B12 deficiency anemia due to intrinsic factor deficiency: Secondary | ICD-10-CM | POA: Diagnosis not present

## 2017-05-21 DIAGNOSIS — E119 Type 2 diabetes mellitus without complications: Secondary | ICD-10-CM | POA: Diagnosis not present

## 2017-05-24 DIAGNOSIS — D510 Vitamin B12 deficiency anemia due to intrinsic factor deficiency: Secondary | ICD-10-CM | POA: Diagnosis not present

## 2017-05-24 DIAGNOSIS — I5033 Acute on chronic diastolic (congestive) heart failure: Secondary | ICD-10-CM | POA: Diagnosis not present

## 2017-05-24 DIAGNOSIS — C50412 Malignant neoplasm of upper-outer quadrant of left female breast: Secondary | ICD-10-CM | POA: Diagnosis not present

## 2017-05-24 DIAGNOSIS — I4891 Unspecified atrial fibrillation: Secondary | ICD-10-CM | POA: Diagnosis not present

## 2017-05-24 DIAGNOSIS — I214 Non-ST elevation (NSTEMI) myocardial infarction: Secondary | ICD-10-CM | POA: Diagnosis not present

## 2017-05-24 DIAGNOSIS — K551 Chronic vascular disorders of intestine: Secondary | ICD-10-CM | POA: Diagnosis not present

## 2017-05-24 DIAGNOSIS — K265 Chronic or unspecified duodenal ulcer with perforation: Secondary | ICD-10-CM | POA: Diagnosis not present

## 2017-05-24 DIAGNOSIS — I951 Orthostatic hypotension: Secondary | ICD-10-CM | POA: Diagnosis not present

## 2017-05-24 DIAGNOSIS — E119 Type 2 diabetes mellitus without complications: Secondary | ICD-10-CM | POA: Diagnosis not present

## 2017-05-24 DIAGNOSIS — J449 Chronic obstructive pulmonary disease, unspecified: Secondary | ICD-10-CM | POA: Diagnosis not present

## 2017-05-25 ENCOUNTER — Other Ambulatory Visit: Payer: Self-pay

## 2017-05-25 ENCOUNTER — Ambulatory Visit: Payer: Medicare Other | Admitting: Urology

## 2017-05-25 DIAGNOSIS — N2889 Other specified disorders of kidney and ureter: Secondary | ICD-10-CM

## 2017-05-25 DIAGNOSIS — K265 Chronic or unspecified duodenal ulcer with perforation: Secondary | ICD-10-CM | POA: Diagnosis not present

## 2017-05-25 DIAGNOSIS — I951 Orthostatic hypotension: Secondary | ICD-10-CM | POA: Diagnosis not present

## 2017-05-25 DIAGNOSIS — J449 Chronic obstructive pulmonary disease, unspecified: Secondary | ICD-10-CM | POA: Diagnosis not present

## 2017-05-25 DIAGNOSIS — I214 Non-ST elevation (NSTEMI) myocardial infarction: Secondary | ICD-10-CM | POA: Diagnosis not present

## 2017-05-25 DIAGNOSIS — C50412 Malignant neoplasm of upper-outer quadrant of left female breast: Secondary | ICD-10-CM | POA: Diagnosis not present

## 2017-05-25 DIAGNOSIS — I4891 Unspecified atrial fibrillation: Secondary | ICD-10-CM | POA: Diagnosis not present

## 2017-05-25 DIAGNOSIS — K551 Chronic vascular disorders of intestine: Secondary | ICD-10-CM | POA: Diagnosis not present

## 2017-05-25 DIAGNOSIS — E119 Type 2 diabetes mellitus without complications: Secondary | ICD-10-CM | POA: Diagnosis not present

## 2017-05-25 DIAGNOSIS — D510 Vitamin B12 deficiency anemia due to intrinsic factor deficiency: Secondary | ICD-10-CM | POA: Diagnosis not present

## 2017-05-25 DIAGNOSIS — I5033 Acute on chronic diastolic (congestive) heart failure: Secondary | ICD-10-CM | POA: Diagnosis not present

## 2017-05-26 DIAGNOSIS — I214 Non-ST elevation (NSTEMI) myocardial infarction: Secondary | ICD-10-CM | POA: Diagnosis not present

## 2017-05-26 DIAGNOSIS — K551 Chronic vascular disorders of intestine: Secondary | ICD-10-CM | POA: Diagnosis not present

## 2017-05-26 DIAGNOSIS — K265 Chronic or unspecified duodenal ulcer with perforation: Secondary | ICD-10-CM | POA: Diagnosis not present

## 2017-05-26 DIAGNOSIS — C50412 Malignant neoplasm of upper-outer quadrant of left female breast: Secondary | ICD-10-CM | POA: Diagnosis not present

## 2017-05-26 DIAGNOSIS — E119 Type 2 diabetes mellitus without complications: Secondary | ICD-10-CM | POA: Diagnosis not present

## 2017-05-26 DIAGNOSIS — D510 Vitamin B12 deficiency anemia due to intrinsic factor deficiency: Secondary | ICD-10-CM | POA: Diagnosis not present

## 2017-05-26 DIAGNOSIS — I4891 Unspecified atrial fibrillation: Secondary | ICD-10-CM | POA: Diagnosis not present

## 2017-05-26 DIAGNOSIS — I951 Orthostatic hypotension: Secondary | ICD-10-CM | POA: Diagnosis not present

## 2017-05-26 DIAGNOSIS — I5033 Acute on chronic diastolic (congestive) heart failure: Secondary | ICD-10-CM | POA: Diagnosis not present

## 2017-05-26 DIAGNOSIS — J449 Chronic obstructive pulmonary disease, unspecified: Secondary | ICD-10-CM | POA: Diagnosis not present

## 2017-05-28 DIAGNOSIS — C50412 Malignant neoplasm of upper-outer quadrant of left female breast: Secondary | ICD-10-CM | POA: Diagnosis not present

## 2017-05-28 DIAGNOSIS — I5033 Acute on chronic diastolic (congestive) heart failure: Secondary | ICD-10-CM | POA: Diagnosis not present

## 2017-05-28 DIAGNOSIS — I4891 Unspecified atrial fibrillation: Secondary | ICD-10-CM | POA: Diagnosis not present

## 2017-05-28 DIAGNOSIS — K265 Chronic or unspecified duodenal ulcer with perforation: Secondary | ICD-10-CM | POA: Diagnosis not present

## 2017-05-28 DIAGNOSIS — K551 Chronic vascular disorders of intestine: Secondary | ICD-10-CM | POA: Diagnosis not present

## 2017-05-28 DIAGNOSIS — D510 Vitamin B12 deficiency anemia due to intrinsic factor deficiency: Secondary | ICD-10-CM | POA: Diagnosis not present

## 2017-05-28 DIAGNOSIS — J449 Chronic obstructive pulmonary disease, unspecified: Secondary | ICD-10-CM | POA: Diagnosis not present

## 2017-05-28 DIAGNOSIS — E119 Type 2 diabetes mellitus without complications: Secondary | ICD-10-CM | POA: Diagnosis not present

## 2017-05-28 DIAGNOSIS — I214 Non-ST elevation (NSTEMI) myocardial infarction: Secondary | ICD-10-CM | POA: Diagnosis not present

## 2017-05-28 DIAGNOSIS — I951 Orthostatic hypotension: Secondary | ICD-10-CM | POA: Diagnosis not present

## 2017-05-31 DIAGNOSIS — E119 Type 2 diabetes mellitus without complications: Secondary | ICD-10-CM | POA: Diagnosis not present

## 2017-05-31 DIAGNOSIS — K551 Chronic vascular disorders of intestine: Secondary | ICD-10-CM | POA: Diagnosis not present

## 2017-05-31 DIAGNOSIS — K265 Chronic or unspecified duodenal ulcer with perforation: Secondary | ICD-10-CM | POA: Diagnosis not present

## 2017-05-31 DIAGNOSIS — I4891 Unspecified atrial fibrillation: Secondary | ICD-10-CM | POA: Diagnosis not present

## 2017-05-31 DIAGNOSIS — J449 Chronic obstructive pulmonary disease, unspecified: Secondary | ICD-10-CM | POA: Diagnosis not present

## 2017-05-31 DIAGNOSIS — C50412 Malignant neoplasm of upper-outer quadrant of left female breast: Secondary | ICD-10-CM | POA: Diagnosis not present

## 2017-05-31 DIAGNOSIS — I5033 Acute on chronic diastolic (congestive) heart failure: Secondary | ICD-10-CM | POA: Diagnosis not present

## 2017-05-31 DIAGNOSIS — D510 Vitamin B12 deficiency anemia due to intrinsic factor deficiency: Secondary | ICD-10-CM | POA: Diagnosis not present

## 2017-05-31 DIAGNOSIS — I951 Orthostatic hypotension: Secondary | ICD-10-CM | POA: Diagnosis not present

## 2017-05-31 DIAGNOSIS — I214 Non-ST elevation (NSTEMI) myocardial infarction: Secondary | ICD-10-CM | POA: Diagnosis not present

## 2017-06-01 NOTE — Telephone Encounter (Signed)
Spoke with pt's daughter and as there discussion with PCP, pt not a candidate for surgery, pallative care that CT, EGD testing is not desired. Encouraged daughter to contact office if there is anything we can do.  (pt has MRI scheduled 06/10/17.

## 2017-06-01 NOTE — Telephone Encounter (Signed)
MRI scheduled for 06/10/17.

## 2017-06-02 ENCOUNTER — Emergency Department
Admission: EM | Admit: 2017-06-02 | Discharge: 2017-06-02 | Disposition: A | Discharge status: Home / Self Care | Payer: Medicare Other | Attending: Emergency Medicine | Admitting: Emergency Medicine

## 2017-06-02 ENCOUNTER — Emergency Department: Payer: Medicare Other

## 2017-06-02 DIAGNOSIS — I110 Hypertensive heart disease with heart failure: Secondary | ICD-10-CM | POA: Diagnosis not present

## 2017-06-02 DIAGNOSIS — Z853 Personal history of malignant neoplasm of breast: Secondary | ICD-10-CM | POA: Insufficient documentation

## 2017-06-02 DIAGNOSIS — N390 Urinary tract infection, site not specified: Secondary | ICD-10-CM | POA: Diagnosis not present

## 2017-06-02 DIAGNOSIS — R319 Hematuria, unspecified: Secondary | ICD-10-CM | POA: Insufficient documentation

## 2017-06-02 DIAGNOSIS — Z87891 Personal history of nicotine dependence: Secondary | ICD-10-CM | POA: Insufficient documentation

## 2017-06-02 DIAGNOSIS — J449 Chronic obstructive pulmonary disease, unspecified: Secondary | ICD-10-CM | POA: Diagnosis not present

## 2017-06-02 DIAGNOSIS — E119 Type 2 diabetes mellitus without complications: Secondary | ICD-10-CM | POA: Insufficient documentation

## 2017-06-02 DIAGNOSIS — I509 Heart failure, unspecified: Secondary | ICD-10-CM | POA: Diagnosis not present

## 2017-06-02 DIAGNOSIS — K265 Chronic or unspecified duodenal ulcer with perforation: Secondary | ICD-10-CM | POA: Diagnosis not present

## 2017-06-02 DIAGNOSIS — I951 Orthostatic hypotension: Secondary | ICD-10-CM | POA: Diagnosis not present

## 2017-06-02 DIAGNOSIS — Z79899 Other long term (current) drug therapy: Secondary | ICD-10-CM | POA: Insufficient documentation

## 2017-06-02 DIAGNOSIS — Z7901 Long term (current) use of anticoagulants: Secondary | ICD-10-CM | POA: Insufficient documentation

## 2017-06-02 DIAGNOSIS — E039 Hypothyroidism, unspecified: Secondary | ICD-10-CM | POA: Insufficient documentation

## 2017-06-02 DIAGNOSIS — I4891 Unspecified atrial fibrillation: Secondary | ICD-10-CM | POA: Diagnosis not present

## 2017-06-02 DIAGNOSIS — E876 Hypokalemia: Secondary | ICD-10-CM | POA: Diagnosis not present

## 2017-06-02 DIAGNOSIS — I5043 Acute on chronic combined systolic (congestive) and diastolic (congestive) heart failure: Secondary | ICD-10-CM | POA: Diagnosis not present

## 2017-06-02 DIAGNOSIS — I5033 Acute on chronic diastolic (congestive) heart failure: Secondary | ICD-10-CM | POA: Diagnosis not present

## 2017-06-02 DIAGNOSIS — K551 Chronic vascular disorders of intestine: Secondary | ICD-10-CM | POA: Diagnosis not present

## 2017-06-02 DIAGNOSIS — I214 Non-ST elevation (NSTEMI) myocardial infarction: Secondary | ICD-10-CM | POA: Diagnosis not present

## 2017-06-02 DIAGNOSIS — R0602 Shortness of breath: Secondary | ICD-10-CM | POA: Diagnosis not present

## 2017-06-02 DIAGNOSIS — C50412 Malignant neoplasm of upper-outer quadrant of left female breast: Secondary | ICD-10-CM | POA: Diagnosis not present

## 2017-06-02 DIAGNOSIS — D510 Vitamin B12 deficiency anemia due to intrinsic factor deficiency: Secondary | ICD-10-CM | POA: Diagnosis not present

## 2017-06-02 LAB — COMPREHENSIVE METABOLIC PANEL
ALT: 10 U/L — AB (ref 14–54)
ANION GAP: 9 (ref 5–15)
AST: 21 U/L (ref 15–41)
Albumin: 3.5 g/dL (ref 3.5–5.0)
Alkaline Phosphatase: 49 U/L (ref 38–126)
BUN: 14 mg/dL (ref 6–20)
CALCIUM: 8.3 mg/dL — AB (ref 8.9–10.3)
CO2: 30 mmol/L (ref 22–32)
Chloride: 101 mmol/L (ref 101–111)
Creatinine, Ser: 0.94 mg/dL (ref 0.44–1.00)
GFR calc Af Amer: >60 mL/min (ref >60)
GFR, EST NON AFRICAN AMERICAN: 56 mL/min — AB (ref >60)
Glucose, Bld: 132 mg/dL — ABNORMAL HIGH (ref 65–99)
POTASSIUM: 3.2 mmol/L — AB (ref 3.5–5.1)
SODIUM: 140 mmol/L (ref 135–145)
Total Bilirubin: 0.9 mg/dL (ref 0.3–1.2)
Total Protein: 8.2 g/dL — ABNORMAL HIGH (ref 6.5–8.1)

## 2017-06-02 LAB — CBC
HCT: 34.2 % — ABNORMAL LOW (ref 35.0–47.0)
HEMOGLOBIN: 11.3 g/dL — AB (ref 12.0–16.0)
MCH: 33.7 pg (ref 26.0–34.0)
MCHC: 32.9 g/dL (ref 32.0–36.0)
MCV: 102.4 fL — ABNORMAL HIGH (ref 80.0–100.0)
PLATELETS: 170 10*3/uL (ref 150–440)
RBC: 3.34 MIL/uL — AB (ref 3.80–5.20)
RDW: 17 % — ABNORMAL HIGH (ref 11.5–14.5)
WBC: 2.8 10*3/uL — AB (ref 3.6–11.0)

## 2017-06-02 LAB — INFLUENZA PANEL BY PCR (TYPE A & B)
INFLBPCR: NEGATIVE
Influenza A By PCR: NEGATIVE

## 2017-06-02 LAB — URINALYSIS, COMPLETE (UACMP) WITH MICROSCOPIC
Bilirubin Urine: NEGATIVE
GLUCOSE, UA: NEGATIVE mg/dL
Ketones, ur: NEGATIVE mg/dL
Nitrite: NEGATIVE
PH: 5 (ref 5.0–8.0)
Protein, ur: NEGATIVE mg/dL
Specific Gravity, Urine: 1.011 (ref 1.005–1.030)

## 2017-06-02 LAB — TROPONIN I: Troponin I: <0.03 ng/mL (ref <0.03)

## 2017-06-02 LAB — BRAIN NATRIURETIC PEPTIDE: B NATRIURETIC PEPTIDE 5: 870 pg/mL — AB (ref 0.0–100.0)

## 2017-06-02 MED ORDER — POTASSIUM CHLORIDE CRYS ER 20 MEQ PO TBCR
40.0000 meq | EXTENDED_RELEASE_TABLET | Freq: Once | ORAL | Status: AC
  Administered 2017-06-02: 40 meq via ORAL
  Filled 2017-06-02: qty 2

## 2017-06-02 MED ORDER — SODIUM CHLORIDE 0.9 % IV SOLN
1.0000 g | Freq: Once | INTRAVENOUS | Status: AC
  Administered 2017-06-02: 1 g via INTRAVENOUS
  Filled 2017-06-02: qty 10

## 2017-06-02 MED ORDER — CEPHALEXIN 500 MG PO CAPS: 500.0000 mg | ORAL_CAPSULE | Freq: Two times a day (BID) | ORAL | 0 refills | Status: DC

## 2017-06-02 NOTE — ED Provider Notes (Addendum)
[facility] Emergency Department Provider Note ____________________________________________   I have reviewed the triage vital signs and the triage nursing note.  HISTORY  Chief Complaint Shortness of Breath and Recurrent UTI   Historian Patient  HPI Mia Padilla is a 83 y.o. female with history of CHF and peripheral edema, as well as COPD, does not wear home O2, presents complaining of shortness of breath worsening over just a couple of days.  Worse when she walks short distances.  No chest pain.  No fevers.  No coughing no productive cough.  Symptoms are mild or nonexistent at rest, moderate with exertion.   Past Medical History:  Diagnosis Date  . Arthritis    hands  . Black head   . Bowel incontinence   . Breast cancer ([place]) 11/15/2013  . Breast cancer of upper-outer quadrant of left female breast ([place]) 2015   Left  . Carotid artery stenosis    a. ultrasound 10/7410: 8-78 LICA stenosis, 67-67% RICA stenosis; b. followed by vascular  . Carotid stenosis 11/17/2016  . Carpal tunnel syndrome   . Chronic combined systolic and diastolic CHF, NYHA class 3 ([place])    a. TTE 10/18: EF 40%, diffuse HK, DD, calcifief mitral annulus with mild MR, mildly dilated left atrium, normal RV cavity size and systolic function, PASP 20-94 mmHg  . COPD (chronic obstructive pulmonary disease) ([place])   . Debilitated   . Diabetes mellitus with complication ([place])   . Glaucoma   . History of seizures   . History of stress test    a. 11/18: small defect of mild severity present in the apex location felt to be 2/2 breast attenuation. Normal study  . Hyperlipidemia   . Hypertension   . Hypomagnesemia   . Hypotension   . Hypothyroidism 02/07/2015  . Mesenteric artery stenosis ([place])   . Obese 06/04/2011  . Obesity   . PAF (paroxysmal atrial fibrillation) (HCC)    a. on Eliquis; b. CHADS2VASc 8 (CHF, HTN, age x 2, DM, vascular disease, sex category)  . Perforated duodenal ulcer  ([place]) 01/2017   a. conservatively managed  . Peripheral vascular disease ([place]) 04/23/2015  . SMA stenosis ([place])    a. noted 10/18; b. conservatively managed; c. unclear if thrombus vs plaque; d. on Eliquis given PAF and Lipitor  . Urinary incontinence     Patient Active Problem List   Diagnosis Date Noted  . Primary open angle glaucoma of both eyes, severe stage 04/22/2017  . Chronic combined systolic and diastolic CHF, NYHA class 3 ([place])   . PAF (paroxysmal atrial fibrillation) ([place])   . Diabetes mellitus with complication ([place])   . Hypomagnesemia   . Hypokalemia   . Mesenteric artery stenosis ([place])   . Perforated duodenal ulcer ([place]) 01/18/2017  . Advanced care planning/counseling discussion 11/19/2016  . Carotid stenosis 11/17/2016  . Lymphedema 02/14/2016  . Vitamin D deficiency 11/12/2015  . B12 deficiency 11/11/2015  . Peripheral vascular disease ([place]) 04/23/2015  . Osteoarthritis 04/23/2015  . Hypothyroidism 02/07/2015  . Urinary incontinence 02/07/2015  . Malignant neoplasm of upper-outer quadrant of female breast ([place]) 06/28/2013  . Hypertension 10/15/2011  . COPD (chronic obstructive pulmonary disease) ([place]) 06/04/2011  . Seizures ([place]) 06/04/2011    Past Surgical History:  Procedure Laterality Date  . BOWEL RESECTION  2005  . BREAST SURGERY  08/03/13   left mastectomy  . CARPAL TUNNEL RELEASE Right 30 years   . MASTECTOMY     left   .  PARTIAL HYSTERECTOMY    . TONSILLECTOMY    . UMBILICAL HERNIA REPAIR      Prior to Admission medications   Medication Sig Start Date End Date Taking? Authorizing Provider  albuterol (PROVENTIL HFA;VENTOLIN HFA) 108 (90 Base) MCG/ACT inhaler Inhale 2 puffs into the lungs every 6 (six) hours as needed for wheezing or shortness of breath. 02/09/17  Yes Johnson, Megan P, DO  blood glucose meter kit and supplies Diagnosis:E11.9 Test glucose twice daily 11/21/14  Yes Johnson, Megan P, DO  feeding supplement, ENSURE ENLIVE, (ENSURE  ENLIVE) LIQD Take 237 mLs by mouth 2 (two) times daily between meals. 01/31/17  Yes Wieting, Richard, MD  glucose blood test strip Use as instructed 11/05/14  Yes Crissman, Jeannette How, MD  acetaminophen (TYLENOL) 500 MG tablet Take 500 mg by mouth every 6 (six) hours as needed.    [provider]  anastrozole (ARIMIDEX) 1 MG tablet Take 1 tablet (1 mg total) by mouth daily. 06/08/16   Christene Lye, MD  apixaban (ELIQUIS) 5 MG TABS tablet Take 1 tablet (5 mg total) by mouth 2 (two) times daily. 03/15/17   Alisa Graff, FNP  atorvastatin (LIPITOR) 20 MG tablet Take 1 tablet (20 mg total) by mouth daily at 6 PM. 02/09/17   Johnson, Megan P, DO  brimonidine (ALPHAGAN P) 0.1 % SOLN 2 (two) times daily.     [provider]  cephALEXin (KEFLEX) 500 MG capsule Take 1 capsule (500 mg total) by mouth 2 (two) times daily. 06/02/17   Lisa Roca, MD  dorzolamide (TRUSOPT) 2 % ophthalmic solution 1 drop 2 (two) times daily.     [provider]  fluticasone (FLONASE) 50 MCG/ACT nasal spray Place 2 sprays into both nostrils daily. 10/14/15   Volney American, PA-C  Fluticasone-Salmeterol (ADVAIR) 250-50 MCG/DOSE AEPB Inhale 1 puff into the lungs every 12 (twelve) hours. 08/18/16   [name], MD  furosemide (LASIX) 20 MG tablet Take 1 tablet (20 mg total) by mouth daily. 02/09/17 02/09/18  Johnson, Megan P, DO  latanoprost (XALATAN) 0.005 % ophthalmic solution Place 1 drop into both eyes at bedtime.    [provider]  LEVEMIR FLEXTOUCH 100 UNIT/ML Pen Inject 6 Units into the skin daily at 10 pm. INJECT 6 UNITS PER DAY 01/31/17   Wieting, Richard, MD  levothyroxine (SYNTHROID, LEVOTHROID) 100 MCG tablet Take 1 tablet (100 mcg total) by mouth daily. 11/19/16   [name], MD  magnesium oxide (MAG-OX) 400 MG tablet Take 1 tablet (400 mg total) by mouth 2 (two) times daily. 04/07/17   Theora Gianotti, NP  meclizine (ANTIVERT) 25 MG tablet Take 1 tablet  (25 mg total) 3 (three) times daily as needed by mouth. 03/05/17   [name], MD  metoprolol succinate (TOPROL-XL) 100 MG 24 hr tablet Take 1 tablet (100 mg total) by mouth daily. 08/18/16   [name], MD  mometasone (ELOCON) 0.1 % cream Apply 1 application topically daily. Apply 1-2 times daily for no longer than 2 weeks at a time 11/24/16   Volney American, PA-C  mometasone-formoterol [facility]) 200-5 MCG/ACT AERO Inhale 2 puffs into the lungs 2 (two) times daily. 02/09/17   Johnson, Megan P, DO  mupirocin ointment (BACTROBAN) 2 % Apply 1 application topically 2 (two) times daily. Do not use day in and day out 11/19/16   [name], MD  nitrofurantoin, macrocrystal-monohydrate, (MACROBID) 100 MG capsule  03/09/17   [provider]  Olopatadine HCl (PATADAY) 0.2 % SOLN Apply 1 drop to eye daily.     [provider]  pantoprazole (PROTONIX) 40 MG tablet Take 1 tablet (40 mg total) 2 (two) times daily by mouth. 03/01/17 03/01/18  Volney American, PA-C  potassium chloride SA (K-DUR,KLOR-CON) 20 MEQ tablet Take 1 tablet (20 mEq total) daily by mouth. 03/05/17   [name], MD  solifenacin (VESICARE) 10 MG tablet Take 1 tablet (10 mg total) daily by mouth. 03/05/17   [name], MD    Allergies  Allergen Reactions  . Propoxyphene Other (See Comments)    Other Reaction: Severe Headache  . Advil [Ibuprofen] Other (See Comments)    Makes her heart race  . Darvocet [Propoxyphene N-Acetaminophen]   . Percocet [Oxycodone-Acetaminophen]     hallucination    Family History  Problem Relation Age of Onset  . Cervical cancer Mother   . Diabetes Mother   . Glaucoma Mother   . Cervical cancer Daughter   . Hypertension Daughter   . Hypertension Father   . Hypertension Sister   . Hypertension Brother   . Hypertension Son   . Diabetes Brother   . HIV Brother   . Breast cancer Neg Hx     Social History Social History   Tobacco Use  .  Smoking status: Former Smoker    Last attempt to quit: 04/20/1974    Years since quitting: 43.1  . Smokeless tobacco: Never Used  . Tobacco comment: Quit 1976  Substance Use Topics  . Alcohol use: No  . Drug use: No    Review of Systems  Constitutional: Negative for fever. Eyes: Negative for visual changes. ENT: Negative for sore throat. Cardiovascular: Negative for chest pain. Respiratory: Positive for shortness of breath. Gastrointestinal: Negative for abdominal pain, vomiting and diarrhea. Genitourinary: Negative for dysuria. Musculoskeletal: Negative for back pain. Skin: Negative for rash. Neurological: Negative for headache.  ____________________________________________   PHYSICAL EXAM:  VITAL SIGNS: ED Triage Vitals [06/02/17 1223]  Enc Vitals Group     BP      Pulse      Resp      Temp      Temp src      SpO2      Weight 184 lb (83.5 kg)     Height      Head Circumference      Peak Flow      Pain Score      Pain Loc      Pain Edu?      Excl. in [place]?      Constitutional: Alert and oriented. Well appearing and in no distress. HEENT   Head: Normocephalic and atraumatic.      Eyes: Conjunctivae are normal. Pupils equal and round.       Ears:         Nose: No congestion/rhinnorhea.   Mouth/Throat: Mucous membranes are moist.   Neck: No stridor. Cardiovascular/Chest: Normal rate, regular rhythm.  No murmurs, rubs, or gallops. Respiratory: Normal respiratory effort without tachypnea nor retractions.  Mild decreased breath sounds throughout, no wheezing, mild rhonchi. Gastrointestinal: Soft. No distention, no guarding, no rebound. Nontender.    Genitourinary/rectal:Deferred Musculoskeletal: Nontender with normal range of motion in all extremities. No joint effusions.  No lower extremity tenderness.  No edema. Neurologic:  Normal speech and language. No gross or focal neurologic deficits are appreciated. Skin:  Skin is warm, dry and intact. No rash  noted. Psychiatric: Mood and affect  are normal. Speech and behavior are normal. Patient exhibits appropriate insight and judgment.   ____________________________________________  LABS (pertinent positives/negatives) I, Lisa Roca, MD the attending physician have reviewed the labs noted below.  Labs Reviewed  CBC - Abnormal; Notable for the following components:      Result Value   WBC 2.8 (*)    RBC 3.34 (*)    Hemoglobin 11.3 (*)    HCT 34.2 (*)    MCV 102.4 (*)    RDW 17.0 (*)    All other components within normal limits  URINALYSIS, COMPLETE (UACMP) WITH MICROSCOPIC - Abnormal; Notable for the following components:   Color, Urine YELLOW (*)    APPearance CLOUDY (*)    Hgb urine dipstick MODERATE (*)    Leukocytes, UA SMALL (*)    Bacteria, UA FEW (*)    Squamous Epithelial / LPF 0-5 (*)    All other components within normal limits  COMPREHENSIVE METABOLIC PANEL - Abnormal; Notable for the following components:   Potassium 3.2 (*)    Glucose, Bld 132 (*)    Calcium 8.3 (*)    Total Protein 8.2 (*)    ALT 10 (*)    GFR calc non Af Amer 56 (*)    All other components within normal limits  BRAIN NATRIURETIC PEPTIDE - Abnormal; Notable for the following components:   B Natriuretic Peptide 870.0 (*)    All other components within normal limits  URINE CULTURE  TROPONIN I  INFLUENZA PANEL BY PCR (TYPE A & B)    ____________________________________________    EKG I, Lisa Roca, MD, the attending physician have personally viewed and interpreted all ECGs.  81 bpm.  Normal sinus rhythm.  Left axis deviation.  Nonspecific ST and T wave ____________________________________________  RADIOLOGY All Xrays were viewed by me.  Imaging interpreted by Radiologist, and I, Lisa Roca, MD the attending physician have reviewed the radiologist interpretation noted below.  Chest x-ray two-view: No acute findings  Radiologist report:  IMPRESSION: No active cardiopulmonary  disease. __________________________________________  PROCEDURES  Procedure(s) performed: None  Critical Care performed: None   ____________________________________________  ED COURSE / ASSESSMENT AND PLAN  Pertinent labs & imaging results that were available during my care of the patient were reviewed by me and considered in my medical decision making (see chart for details).   Patient here for shortness of breath, she does have lower extremity edema but states that does not necessarily seem worse than usual.  Lungs sound clear without wheezing.  No chest pain.  Chest x-ray without infiltrate or obvious pulmonary edema, but BNP is elevated 800.  I think most likely cause here is with CHF exacerbation.  Without hypoxia, she is not in any distress, I think is okay for outpatient management.  She takes 1 tablet of Lasix in the morning which is reported to be 20 mg tablet.  I am going to have her double this for about 4 days and follow-up with her primary doctor next week.  She does have mild hypokalemia here, so make sure that she is taking her potassium supplementation.  No pneumonia symptoms.  Not suspicious for PE.  Not suspicious for ACS.  Patient okay for outpatient management.  Addended, patient and family wanted to check the urinalysis, and positive for urine tract infection.  Patient was given 1 dose of Rocephin.  Urine culture was sent.  Patient was at home with Keflex.  DIFFERENTIAL DIAGNOSIS: Differential includes, but is not limited to, viral syndrome,  bronchitis including COPD exacerbation, pneumonia, reactive airway disease including asthma, CHF including exacerbation with or without pulmonary/interstitial edema, pneumothorax, ACS, thoracic trauma, and pulmonary embolism.  CONSULTATIONS:  None   Patient / Family / Caregiver informed of clinical course, medical decision-making process, and agree with plan.   I discussed return precautions, follow-up instructions, and  discharge instructions with patient and/or family.  Discharge Instructions : You are evaluated for shortness of breath, and I suspect the cause is congestive heart failure exacerbation.  Please increase your Lasix to 1 pill twice daily, doubling her dose for 4 days.  You are also being treated with Keflex for urinary tract infection, start tomorrow evening.  Your potassium level is low despite supplement, given the increased dose of Lasix, please also double your dose, take 2 tablets for a total of 40 equivalents of potassium supplementation for the next 4 days with the increased dosing of Lasix.  Return to emerge department immediately for any worsening condition including any fever or cough or congestion, no worsening trouble breathing or shortness of breath, chest pain, dizziness or palpitations, or any other symptoms concerning to you.    ___________________________________________   FINAL CLINICAL IMPRESSION(S) / ED DIAGNOSES   Final diagnoses:  Acute on chronic congestive heart failure, unspecified heart failure type ([place])  Hypokalemia  Urinary tract infection with hematuria, site unspecified      ___________________________________________        Note: This dictation was prepared with Dragon dictation. Any transcriptional errors that result from this process are unintentional    Lisa Roca, MD 06/02/17 1600    Lisa Roca, MD 06/02/17 [phone]

## 2017-06-02 NOTE — ED Notes (Signed)
Per Ena in lab, pt's light green and red tops are both "grossly hemolyzed" so they are unable to run CMP on her at this time.  Lab requires a recollect on these specimens at this time.

## 2017-06-02 NOTE — ED Notes (Signed)
Pt sent to Xray at this time.

## 2017-06-02 NOTE — ED Notes (Signed)
Spoke with lab again.  Recollect of light green top is hemolyzed as well.  Notified lab that phlebotomist would need to come to collect blood as this is 2nd collection on patient and patient is a difficult stick.  Difficult collection tag was included in 2nd collection of blood.

## 2017-06-02 NOTE — Discharge Instructions (Addendum)
You are evaluated for shortness of breath, and I suspect the cause is congestive heart failure exacerbation.  Please increase your Lasix to 1 pill twice daily, doubling her dose for 4 days.  You are also being treated with Keflex for urinary tract infection, start tomorrow evening.  Your potassium level is low despite supplement, given the increased dose of Lasix, please also double your dose, take 2 tablets for a total of 40 equivalents of potassium supplementation for the next 4 days with the increased dosing of Lasix.  Return to emerge department immediately for any worsening condition including any fever or cough or congestion, no worsening trouble breathing or shortness of breath, chest pain, dizziness or palpitations, or any other symptoms concerning to you.

## 2017-06-02 NOTE — ED Triage Notes (Signed)
Pt brought in by [facility] from home.  Per EMS, pt is Shob on exertion x2 weeks with hx of UTI's and COPD.  Pt lives at home with adult son.  Pt is A&Ox4, states she has eaten this AM and that she took all over her home meds.  Pt breathing even and nonlabored.  Pt in NAD at this time.

## 2017-06-04 DIAGNOSIS — C50412 Malignant neoplasm of upper-outer quadrant of left female breast: Secondary | ICD-10-CM | POA: Diagnosis not present

## 2017-06-04 DIAGNOSIS — D510 Vitamin B12 deficiency anemia due to intrinsic factor deficiency: Secondary | ICD-10-CM | POA: Diagnosis not present

## 2017-06-04 DIAGNOSIS — I951 Orthostatic hypotension: Secondary | ICD-10-CM | POA: Diagnosis not present

## 2017-06-04 DIAGNOSIS — I5033 Acute on chronic diastolic (congestive) heart failure: Secondary | ICD-10-CM | POA: Diagnosis not present

## 2017-06-04 DIAGNOSIS — E119 Type 2 diabetes mellitus without complications: Secondary | ICD-10-CM | POA: Diagnosis not present

## 2017-06-04 DIAGNOSIS — J449 Chronic obstructive pulmonary disease, unspecified: Secondary | ICD-10-CM | POA: Diagnosis not present

## 2017-06-04 DIAGNOSIS — I214 Non-ST elevation (NSTEMI) myocardial infarction: Secondary | ICD-10-CM | POA: Diagnosis not present

## 2017-06-04 DIAGNOSIS — K551 Chronic vascular disorders of intestine: Secondary | ICD-10-CM | POA: Diagnosis not present

## 2017-06-04 DIAGNOSIS — K265 Chronic or unspecified duodenal ulcer with perforation: Secondary | ICD-10-CM | POA: Diagnosis not present

## 2017-06-04 DIAGNOSIS — I4891 Unspecified atrial fibrillation: Secondary | ICD-10-CM | POA: Diagnosis not present

## 2017-06-05 LAB — URINE CULTURE

## 2017-06-07 DIAGNOSIS — I4891 Unspecified atrial fibrillation: Secondary | ICD-10-CM | POA: Diagnosis not present

## 2017-06-07 DIAGNOSIS — D510 Vitamin B12 deficiency anemia due to intrinsic factor deficiency: Secondary | ICD-10-CM | POA: Diagnosis not present

## 2017-06-07 DIAGNOSIS — K265 Chronic or unspecified duodenal ulcer with perforation: Secondary | ICD-10-CM | POA: Diagnosis not present

## 2017-06-07 DIAGNOSIS — C50412 Malignant neoplasm of upper-outer quadrant of left female breast: Secondary | ICD-10-CM | POA: Diagnosis not present

## 2017-06-07 DIAGNOSIS — I214 Non-ST elevation (NSTEMI) myocardial infarction: Secondary | ICD-10-CM | POA: Diagnosis not present

## 2017-06-07 DIAGNOSIS — I951 Orthostatic hypotension: Secondary | ICD-10-CM | POA: Diagnosis not present

## 2017-06-07 DIAGNOSIS — J449 Chronic obstructive pulmonary disease, unspecified: Secondary | ICD-10-CM | POA: Diagnosis not present

## 2017-06-07 DIAGNOSIS — E119 Type 2 diabetes mellitus without complications: Secondary | ICD-10-CM | POA: Diagnosis not present

## 2017-06-07 DIAGNOSIS — K551 Chronic vascular disorders of intestine: Secondary | ICD-10-CM | POA: Diagnosis not present

## 2017-06-07 DIAGNOSIS — I5033 Acute on chronic diastolic (congestive) heart failure: Secondary | ICD-10-CM | POA: Diagnosis not present

## 2017-06-09 ENCOUNTER — Telehealth: Payer: Self-pay | Admitting: Urology

## 2017-06-09 DIAGNOSIS — J449 Chronic obstructive pulmonary disease, unspecified: Secondary | ICD-10-CM | POA: Diagnosis not present

## 2017-06-09 DIAGNOSIS — I5033 Acute on chronic diastolic (congestive) heart failure: Secondary | ICD-10-CM | POA: Diagnosis not present

## 2017-06-09 DIAGNOSIS — C50412 Malignant neoplasm of upper-outer quadrant of left female breast: Secondary | ICD-10-CM | POA: Diagnosis not present

## 2017-06-09 DIAGNOSIS — K551 Chronic vascular disorders of intestine: Secondary | ICD-10-CM | POA: Diagnosis not present

## 2017-06-09 DIAGNOSIS — K265 Chronic or unspecified duodenal ulcer with perforation: Secondary | ICD-10-CM | POA: Diagnosis not present

## 2017-06-09 DIAGNOSIS — I214 Non-ST elevation (NSTEMI) myocardial infarction: Secondary | ICD-10-CM | POA: Diagnosis not present

## 2017-06-09 DIAGNOSIS — I4891 Unspecified atrial fibrillation: Secondary | ICD-10-CM | POA: Diagnosis not present

## 2017-06-09 DIAGNOSIS — I951 Orthostatic hypotension: Secondary | ICD-10-CM | POA: Diagnosis not present

## 2017-06-09 DIAGNOSIS — D510 Vitamin B12 deficiency anemia due to intrinsic factor deficiency: Secondary | ICD-10-CM | POA: Diagnosis not present

## 2017-06-09 DIAGNOSIS — E119 Type 2 diabetes mellitus without complications: Secondary | ICD-10-CM | POA: Diagnosis not present

## 2017-06-09 NOTE — Telephone Encounter (Signed)
Patient is scheduled for an MRI abdomen on 2/21.  She called the office this morning asking for a valium.  She does not have transportation to come to the office to pick up the prescription.  She is asking if we can call into the [facility] on [address].   After speaking to Dr. Erlene Quan, she said that the patient would have to come to the office to pick up the prescription.  We are not able to cal lin a valium into the pharmacy.  She is not comfortable providing this patient with a valium due to her co morbidities.  I provided this information to the patient.  She expressed understanding.

## 2017-06-10 ENCOUNTER — Ambulatory Visit
Admission: RE | Admit: 2017-06-10 | Discharge: 2017-06-10 | Disposition: A | Discharge status: Home / Self Care | Payer: Medicare Other | Source: Ambulatory Visit | Attending: Urology | Admitting: Urology

## 2017-06-10 ENCOUNTER — Ambulatory Visit: Payer: Medicare Other | Admitting: Urology

## 2017-06-10 DIAGNOSIS — N2889 Other specified disorders of kidney and ureter: Secondary | ICD-10-CM | POA: Diagnosis not present

## 2017-06-10 DIAGNOSIS — I700 Atherosclerosis of aorta: Secondary | ICD-10-CM | POA: Diagnosis not present

## 2017-06-10 DIAGNOSIS — N281 Cyst of kidney, acquired: Secondary | ICD-10-CM | POA: Insufficient documentation

## 2017-06-10 DIAGNOSIS — K7689 Other specified diseases of liver: Secondary | ICD-10-CM | POA: Diagnosis not present

## 2017-06-10 MED ORDER — GADOBENATE DIMEGLUMINE 529 MG/ML IV SOLN
20.0000 mL | Freq: Once | INTRAVENOUS | Status: AC | PRN
  Administered 2017-06-10: 17 mL via INTRAVENOUS

## 2017-06-11 ENCOUNTER — Telehealth: Payer: Self-pay | Admitting: Cardiovascular Disease

## 2017-06-11 DIAGNOSIS — I4891 Unspecified atrial fibrillation: Secondary | ICD-10-CM | POA: Diagnosis not present

## 2017-06-11 DIAGNOSIS — J449 Chronic obstructive pulmonary disease, unspecified: Secondary | ICD-10-CM | POA: Diagnosis not present

## 2017-06-11 DIAGNOSIS — C50412 Malignant neoplasm of upper-outer quadrant of left female breast: Secondary | ICD-10-CM | POA: Diagnosis not present

## 2017-06-11 DIAGNOSIS — K265 Chronic or unspecified duodenal ulcer with perforation: Secondary | ICD-10-CM | POA: Diagnosis not present

## 2017-06-11 DIAGNOSIS — K551 Chronic vascular disorders of intestine: Secondary | ICD-10-CM | POA: Diagnosis not present

## 2017-06-11 DIAGNOSIS — I214 Non-ST elevation (NSTEMI) myocardial infarction: Secondary | ICD-10-CM | POA: Diagnosis not present

## 2017-06-11 DIAGNOSIS — I5033 Acute on chronic diastolic (congestive) heart failure: Secondary | ICD-10-CM | POA: Diagnosis not present

## 2017-06-11 DIAGNOSIS — I951 Orthostatic hypotension: Secondary | ICD-10-CM | POA: Diagnosis not present

## 2017-06-11 DIAGNOSIS — D510 Vitamin B12 deficiency anemia due to intrinsic factor deficiency: Secondary | ICD-10-CM | POA: Diagnosis not present

## 2017-06-11 DIAGNOSIS — E119 Type 2 diabetes mellitus without complications: Secondary | ICD-10-CM | POA: Diagnosis not present

## 2017-06-11 NOTE — Telephone Encounter (Signed)
Pt called  She is coming on 06/15/17 at 2 pm to see Dr Rockey Situ  Nothing else needed

## 2017-06-11 NOTE — Progress Notes (Deleted)
Patient ID: Mia Padilla, female    DOB: [birth year], 83 y.o.   MRN: [id number]  HPI  Ms Bilello is an 83 y/o female with a history of breast cancer, COPD, diabetes, glaucoma, HTN, hyperlipidemia, DVT, atrial fibrillation, remote tobacco use and chronic heart failure.   Echo report from 01/24/17 reviewed and shows an EF of 40% along with mild MR and a PA pressure of 50-55 mm Hg.  Was in the ED 06/02/17 due to HF exacerbation. Diuretic dose adjusted and she was released. Was in the ED 02/22/17 due to hypotension due to hypovolemia. IV fluids were given and she was released home. Admitted 01/24/17 due to perforated duodenal ulcer. IV protonix was given with transition to oral protonix. Surgical and cardiology consults were obtained. Elevated troponin thought to be due to demand ischemia. Discharged home after 7 days.    She presents today for a follow-up visit with a chief complaint of   Past Medical History:  Diagnosis Date  . Arthritis    hands  . Black head   . Bowel incontinence   . Breast cancer ([place]) 11/15/2013  . Breast cancer of upper-outer quadrant of left female breast ([place]) 2015   Left  . Carotid artery stenosis    a. ultrasound 11/4130: 4-40 LICA stenosis, 10-27% RICA stenosis; b. followed by vascular  . Carotid stenosis 11/17/2016  . Carpal tunnel syndrome   . Chronic combined systolic and diastolic CHF, NYHA class 3 ([place])    a. TTE 10/18: EF 40%, diffuse HK, DD, calcifief mitral annulus with mild MR, mildly dilated left atrium, normal RV cavity size and systolic function, PASP 25-36 mmHg  . COPD (chronic obstructive pulmonary disease) ([place])   . Debilitated   . Diabetes mellitus with complication ([place])   . Glaucoma   . History of seizures   . History of stress test    a. 11/18: small defect of mild severity present in the apex location felt to be 2/2 breast attenuation. Normal study  . Hyperlipidemia   . Hypertension   . Hypomagnesemia   . Hypotension   . Hypothyroidism  02/07/2015  . Mesenteric artery stenosis ([place])   . Obese 06/04/2011  . Obesity   . PAF (paroxysmal atrial fibrillation) (HCC)    a. on Eliquis; b. CHADS2VASc 8 (CHF, HTN, age x 2, DM, vascular disease, sex category)  . Perforated duodenal ulcer ([place]) 01/2017   a. conservatively managed  . Peripheral vascular disease ([place]) 04/23/2015  . SMA stenosis ([place])    a. noted 10/18; b. conservatively managed; c. unclear if thrombus vs plaque; d. on Eliquis given PAF and Lipitor  . Urinary incontinence    Past Surgical History:  Procedure Laterality Date  . BOWEL RESECTION  2005  . BREAST SURGERY  08/03/13   left mastectomy  . CARPAL TUNNEL RELEASE Right 30 years   . MASTECTOMY     left   . PARTIAL HYSTERECTOMY    . TONSILLECTOMY    . UMBILICAL HERNIA REPAIR     Family History  Problem Relation Age of Onset  . Cervical cancer Mother   . Diabetes Mother   . Glaucoma Mother   . Cervical cancer Daughter   . Hypertension Daughter   . Hypertension Father   . Hypertension Sister   . Hypertension Brother   . Hypertension Son   . Diabetes Brother   . HIV Brother   . Breast cancer Neg Hx    Social History   Tobacco Use  .  Smoking status: Former Smoker    Last attempt to quit: 04/20/1974    Years since quitting: 43.1  . Smokeless tobacco: Never Used  . Tobacco comment: Quit 1976  Substance Use Topics  . Alcohol use: No   Allergies  Allergen Reactions  . Propoxyphene Other (See Comments)    Other Reaction: Severe Headache  . Advil [Ibuprofen] Other (See Comments)    Makes her heart race  . Darvocet [Propoxyphene N-Acetaminophen]   . Percocet [Oxycodone-Acetaminophen]     hallucination     Review of Systems  Constitutional: Positive for fatigue. Negative for appetite change.  HENT: Positive for rhinorrhea. Negative for congestion and sore throat.   Eyes: Positive for visual disturbance (blurry vision).  Respiratory: Positive for chest tightness (soreness) and shortness of  breath. Negative for cough.   Cardiovascular: Positive for leg swelling. Negative for chest pain and palpitations.  Gastrointestinal: Negative for abdominal distention and abdominal pain.  Endocrine: Negative.   Genitourinary: Negative.   Musculoskeletal: Positive for arthralgias (both hips hurt). Negative for back pain.  Skin: Negative.   Allergic/Immunologic: Negative.   Neurological: Positive for dizziness. Negative for light-headedness.  Hematological: Negative for adenopathy. Does not bruise/bleed easily.  Psychiatric/Behavioral: Positive for sleep disturbance (sleeping more during the day). Negative for dysphoric mood. The patient is not nervous/anxious.      Physical Exam  Constitutional: She is oriented to person, place, and time. She appears well-developed and well-nourished.  HENT:  Head: Normocephalic and atraumatic.  Neck: Normal range of motion. Neck supple. No JVD present.  Cardiovascular: Normal rate. An irregular rhythm present.  Pulmonary/Chest: Effort normal. She has no wheezes. She has no rales.  Abdominal: Soft. She exhibits no distension. There is no tenderness.  Musculoskeletal: She exhibits edema (1+ pitting edema in bilateral lower legs). She exhibits no tenderness.  Neurological: She is alert and oriented to person, place, and time.  Skin: Skin is warm and dry.  Psychiatric: She has a normal mood and affect. Her behavior is normal. Thought content normal.  Nursing note and vitals reviewed.  Assessment & Plan:  1: Chronic heart failure with preserved ejection fraction- - NYHA class III - euvolemic today - weighing daily and she was reminded to weigh daily and call for an overnight weight gain of >2 pounds or a weekly weight gain of >5 pounds - weight down 4 pounds since she was last here - not adding salt and says that she lives with her son/daughter-in-law and they don't cook with salt. Reviewed the importance of reading food labels so that she can keep  daily sodium intake to 2000mg .  - saw cardiology ([facility]) 04/06/17 - patient reports receiving her flu vaccine for the season already  2: HTN- - BP looks good today - last saw PCP (Crissman ) 03/05/17 - BMP from 06/02/17 reviewed and shows sodium 140, potassium 3.2 and GFR >60  3: Atrial fibrillaion- - currently rate controlled at this time - continues on apixaban & metoprolol  4: Diabetes- - glucose in clinic was  - continue levemir - does see nephrologist Candiss Norse)  5: Lymphedema- - does elevate her legs - wearing support hose and edema has improved slightly - limited in her ability to exercise due to vision changes and instability in walking without her walker - should edema continue, could consider lymphapress compression boots  Medication bottles were reviewed.

## 2017-06-11 NOTE — Telephone Encounter (Signed)
-----   Message from Valora Corporal, RN sent at 06/10/2017  5:45 PM EST ----- Regarding: Needs sooner appointment. Patient needs sooner appointment due to increased swelling. Could we put her in a 7 day slot or if something comes open.   Thanks, Lesleigh Noe.

## 2017-06-13 NOTE — Progress Notes (Signed)
Cardiology Office Note  Date:  06/15/2017   ID:  BRANDIS WIXTED, DOB [birth year], MRN [id number]  PCP:  [name], MD   Chief Complaint  Patient presents with  . Other    3 month follow up. Meds reviewed by the pt.'s bottles. Pt. c/o LE edema and shortness of breath with little to no exertion.     HPI:  Ms. Kopf is a 83 year old woman with history of  stroke,  seizure disorder,  hypertension,  COPD,  smoking for 20 years (1958 to 1978), on inhalers diabetes,  hypothyroidism,  obesity,  atrial fibrillation  hospital admission April 04 2011 for mental status changes.   echocardiogram showing normal LV function,  carotid ultrasound showing no significant disease,  head CT and MRI showing no significant stroke.   possible TIA.   started on warfarin for atrial fibrillation , Stopped since that time by primary care secondary to unsteady gait and risk of falls Lives with her son and his wife.  She presents today for follow up of her atrial fibrillation.   Increasing SOB Went to the ER Hospital records reviewed with the patient in detail Was doing lasix 40 BID Weight up 10 pounds? Now lasix 60 mg am and 40 mg pm  High water intake Followed by advanced home Severe leg swelling, can't walk SOB with walking  Labs reviewed HCT 34 Urine ecoli CR 0.9 BNP 870 LDL 46  EKG personally reviewed by myself on todays visit Normal sinus rhythm with rate 87 bpm no significant ST or T-wave changes, Poor R wave progression through the anterior precordial leads, left axis deviation  Other past medical history reviewed  Urgent care around 1/15/ 2018: With symptoms of cough On 05/08/2016: She went to the emergency room for similar symptoms with Cough and SOB BNP 207, tnt 0.03  Occasional vertigo for which she takes meclizine when necessary  Significant gait instability, uses a walker significant osteoarthritis of her knees. H/o mastectomy 08/03/2013 for breast cancer by Dr.  Jamal Collin. No radiation treatment or chemotherapy needed, she is on Arimidex.   Coumadin was stopped in the past secondary to unsteady gait by Dr. Jeananne Rama. She was started on Plavix 10/13/2011. She denies any recent falls.  Periods of tachycardia in the past, better on metoprolol Notes indicate that she has had a history of seizures, possibly with low glucose levels. Previous diagnosis of complex partial seizures and she was started on antiseizure medications. Weaned off Keppra   PMH:   has a past medical history of Arthritis, Black head, Bowel incontinence, Breast cancer ([place]) (11/15/2013), Breast cancer of upper-outer quadrant of left female breast ([place]) (2015), Carotid artery stenosis, Carotid stenosis (11/17/2016), Carpal tunnel syndrome, Chronic combined systolic and diastolic CHF, NYHA class 3 ([place]), COPD (chronic obstructive pulmonary disease) ([place]), Debilitated, Diabetes mellitus with complication ([place]), Glaucoma, History of seizures, History of stress test, Hyperlipidemia, Hypertension, Hypomagnesemia, Hypotension, Hypothyroidism (02/07/2015), Mesenteric artery stenosis ([place]), Obese (06/04/2011), Obesity, PAF (paroxysmal atrial fibrillation) ([place]), Perforated duodenal ulcer ([place]) (01/2017), Peripheral vascular disease ([place]) (04/23/2015), SMA stenosis ([place]), and Urinary incontinence.  PSH:    Past Surgical History:  Procedure Laterality Date  . BOWEL RESECTION  2005  . BREAST SURGERY  08/03/13   left mastectomy  . CARPAL TUNNEL RELEASE Right 30 years   . MASTECTOMY     left   . PARTIAL HYSTERECTOMY    . TONSILLECTOMY    . UMBILICAL HERNIA REPAIR      Current Outpatient Medications  Medication  Sig Dispense Refill  . acetaminophen (TYLENOL) 500 MG tablet Take 500 mg by mouth every 6 (six) hours as needed.    Marland Kitchen albuterol (PROVENTIL HFA;VENTOLIN HFA) 108 (90 Base) MCG/ACT inhaler Inhale 2 puffs into the lungs every 6 (six) hours as needed for wheezing or shortness of breath. 1 Inhaler 0  .  anastrozole (ARIMIDEX) 1 MG tablet Take 1 tablet (1 mg total) by mouth daily. 90 tablet 4  . apixaban (ELIQUIS) 5 MG TABS tablet Take 1 tablet (5 mg total) by mouth 2 (two) times daily. 30 tablet 3  . atorvastatin (LIPITOR) 20 MG tablet Take 1 tablet (20 mg total) by mouth daily at 6 PM. 90 tablet 1  . blood glucose meter kit and supplies Diagnosis:E11.9 Test glucose twice daily 1 each 12  . brimonidine (ALPHAGAN P) 0.1 % SOLN 2 (two) times daily.     . dorzolamide (TRUSOPT) 2 % ophthalmic solution 1 drop 2 (two) times daily.     . feeding supplement, ENSURE ENLIVE, (ENSURE ENLIVE) LIQD Take 237 mLs by mouth 2 (two) times daily between meals. 60 Bottle 0  . fluticasone (FLONASE) 50 MCG/ACT nasal spray Place 2 sprays into both nostrils daily. 16 g 12  . Fluticasone-Salmeterol (ADVAIR) 250-50 MCG/DOSE AEPB Inhale 1 puff into the lungs every 12 (twelve) hours. 180 each 4  . furosemide (LASIX) 20 MG tablet Take 20 mg (3) tablets in the am & 20 mg (2) tablets in the pm.    . glucose blood test strip Use as instructed 100 each 12  . latanoprost (XALATAN) 0.005 % ophthalmic solution Place 1 drop into both eyes at bedtime.    Marland Kitchen LEVEMIR FLEXTOUCH 100 UNIT/ML Pen Inject 6 Units into the skin daily at 10 pm. INJECT 6 UNITS PER DAY 15 mL 11  . levothyroxine (SYNTHROID, LEVOTHROID) 100 MCG tablet Take 1 tablet (100 mcg total) by mouth daily. 90 tablet 4  . magnesium oxide (MAG-OX) 400 MG tablet Take 1 tablet (400 mg total) by mouth 2 (two) times daily. 60 tablet 3  . meclizine (ANTIVERT) 25 MG tablet Take 1 tablet (25 mg total) 3 (three) times daily as needed by mouth. 90 tablet 1  . metoprolol succinate (TOPROL-XL) 100 MG 24 hr tablet Take 1 tablet (100 mg total) by mouth daily. 90 tablet 4  . mometasone (ELOCON) 0.1 % cream Apply 1 application topically daily. Apply 1-2 times daily for no longer than 2 weeks at a time 50 g 3  . mometasone-formoterol (DULERA) 200-5 MCG/ACT AERO Inhale 2 puffs into the lungs 2  (two) times daily. 13 g 12  . mupirocin ointment (BACTROBAN) 2 % Apply 1 application topically 2 (two) times daily. Do not use day in and day out 22 g 2  . Olopatadine HCl (PATADAY) 0.2 % SOLN Apply 1 drop to eye daily.     . pantoprazole (PROTONIX) 40 MG tablet Take 1 tablet (40 mg total) 2 (two) times daily by mouth. 180 tablet 1  . potassium chloride SA (K-DUR,KLOR-CON) 20 MEQ tablet Take 1 tablet (20 mEq total) daily by mouth. 30 tablet 6  . solifenacin (VESICARE) 10 MG tablet Take 1 tablet (10 mg total) daily by mouth. 90 tablet 2   No current facility-administered medications for this visit.      Allergies:   Propoxyphene; Advil [ibuprofen]; Darvocet [propoxyphene n-acetaminophen]; and Percocet [oxycodone-acetaminophen]   Social History:  The patient  reports that she quit smoking about 43 years ago. she has never used smokeless  tobacco. She reports that she does not drink alcohol or use drugs.   Family History:   family history includes Cervical cancer in her daughter and mother; Diabetes in her brother and mother; Glaucoma in her mother; HIV in her brother; Hypertension in her brother, daughter, father, sister, and son.    Review of Systems: Review of Systems  Constitutional: Negative.        Weight gain  Respiratory: Positive for shortness of breath.   Cardiovascular: Positive for leg swelling.  Gastrointestinal: Negative.   Musculoskeletal: Negative.   Neurological: Negative.   Psychiatric/Behavioral: Negative.   All other systems reviewed and are negative.    PHYSICAL EXAM: VS:  BP 110/60 (BP Location: Right Arm, Patient Position: Sitting, Cuff Size: Normal)   Pulse 87   Ht '5\' 3"'$  (1.6 m)   Wt 195 lb (88.5 kg) Comment: check at home on 06/14/2017  BMI 34.54 kg/m  , BMI Body mass index is 34.54 kg/m.  Constitutional:  oriented to person, place, and time. No distress. Presenting in a wheelchair HENT:  Head: Normocephalic and atraumatic.  Eyes:  no discharge. No  scleral icterus.  Neck: Normal range of motion. Neck supple. No JVD present.  Cardiovascular: Normal rate, regular rhythm, normal heart sounds and intact distal pulses. Exam reveals no gallop and no friction rub.  1-2+ pitting edema to the thighs No murmur heard. Pulmonary/Chest: Effort normal and breath sounds normal. No stridor. No respiratory distress.  no wheezes.  no rales.  no tenderness.  Abdominal: Soft.  no distension.  no tenderness.  Musculoskeletal: Normal range of motion.  no  tenderness or deformity.  Neurological:  normal muscle tone. Coordination normal. No atrophy Skin: Skin is warm and dry. No rash noted. not diaphoretic.  Psychiatric:  normal mood and affect. behavior is normal. Thought content normal.       Recent Labs: 01/24/2017: TSH 3.197 04/06/2017: Magnesium 1.3 06/02/2017: ALT 10; B Natriuretic Peptide 870.0; BUN 14; Creatinine, Ser 0.94; Hemoglobin 11.3; Platelets 170; Potassium 3.2; Sodium 140    Lipid Panel Lab Results  Component Value Date   CHOL 96 05/04/2017   HDL 37 (L) 02/09/2017   LDLCALC 38 02/09/2017   TRIG 66 05/04/2017      Wt Readings from Last 3 Encounters:  06/15/17 195 lb (88.5 kg)  06/02/17 184 lb (83.5 kg)  04/08/17 184 lb 12.8 oz (83.8 kg)       ASSESSMENT AND PLAN:   Paroxysmal atrial fibrillation (HCC) - Plan: EKG 12-Lead Maintaining normal sinus rhythm on today's visit Not on anticoagulation, high fall risk, stopped by primary care Stable  Essential hypertension We will change her diuretic otherwise no medication changes made Blood pressure stable  Centrilobular emphysema (HCC) Previous COPD exacerbation Treated with Augmentin, prednisone.  None recently  Type 2 diabetes mellitus with diabetic peripheral angiopathy without gangrene, without long-term current use of insulin (HCC) Unable to exercise. Recommended low carbohydrate diet Weight continues to be an issue  Chronic diastolic congestive heart failure  (HCC) Severe weight gain 10 pounds Likely Lasix not working well, high fluid intake Stable renal function, low potassium Recommend we change Lasix to torsemide 40 twice daily CHF clinic, advanced home follow-up   Total encounter time more than 25 minutes  Greater than 50% was spent in counseling and coordination of care with the patient   Disposition:   F/U  1 month   Orders Placed This Encounter  Procedures  . EKG 12-Lead     Signed, Octavia Bruckner  Rockey Situ, M.D., Ph.D. 06/15/2017  [place], [place]

## 2017-06-14 ENCOUNTER — Ambulatory Visit: Payer: Medicare Other | Admitting: Family

## 2017-06-14 DIAGNOSIS — D510 Vitamin B12 deficiency anemia due to intrinsic factor deficiency: Secondary | ICD-10-CM | POA: Diagnosis not present

## 2017-06-14 DIAGNOSIS — J449 Chronic obstructive pulmonary disease, unspecified: Secondary | ICD-10-CM | POA: Diagnosis not present

## 2017-06-14 DIAGNOSIS — E119 Type 2 diabetes mellitus without complications: Secondary | ICD-10-CM | POA: Diagnosis not present

## 2017-06-14 DIAGNOSIS — I4891 Unspecified atrial fibrillation: Secondary | ICD-10-CM | POA: Diagnosis not present

## 2017-06-14 DIAGNOSIS — C50412 Malignant neoplasm of upper-outer quadrant of left female breast: Secondary | ICD-10-CM | POA: Diagnosis not present

## 2017-06-14 DIAGNOSIS — K265 Chronic or unspecified duodenal ulcer with perforation: Secondary | ICD-10-CM | POA: Diagnosis not present

## 2017-06-14 DIAGNOSIS — I214 Non-ST elevation (NSTEMI) myocardial infarction: Secondary | ICD-10-CM | POA: Diagnosis not present

## 2017-06-14 DIAGNOSIS — I951 Orthostatic hypotension: Secondary | ICD-10-CM | POA: Diagnosis not present

## 2017-06-14 DIAGNOSIS — K551 Chronic vascular disorders of intestine: Secondary | ICD-10-CM | POA: Diagnosis not present

## 2017-06-14 DIAGNOSIS — I5033 Acute on chronic diastolic (congestive) heart failure: Secondary | ICD-10-CM | POA: Diagnosis not present

## 2017-06-15 ENCOUNTER — Ambulatory Visit: Payer: Medicare Other | Admitting: General Surgery

## 2017-06-15 ENCOUNTER — Ambulatory Visit: Payer: Medicare Other | Admitting: Cardiovascular Disease

## 2017-06-15 ENCOUNTER — Encounter: Payer: Self-pay | Admitting: Cardiovascular Disease

## 2017-06-15 VITALS — BP 110/60 | HR 87 | Ht 63.0 in | Wt 195.0 lb

## 2017-06-15 DIAGNOSIS — I771 Stricture of artery: Secondary | ICD-10-CM | POA: Diagnosis not present

## 2017-06-15 DIAGNOSIS — I10 Essential (primary) hypertension: Secondary | ICD-10-CM

## 2017-06-15 DIAGNOSIS — K551 Chronic vascular disorders of intestine: Secondary | ICD-10-CM

## 2017-06-15 DIAGNOSIS — I5032 Chronic diastolic (congestive) heart failure: Secondary | ICD-10-CM

## 2017-06-15 DIAGNOSIS — I739 Peripheral vascular disease, unspecified: Secondary | ICD-10-CM | POA: Diagnosis not present

## 2017-06-15 DIAGNOSIS — I5042 Chronic combined systolic (congestive) and diastolic (congestive) heart failure: Secondary | ICD-10-CM | POA: Diagnosis not present

## 2017-06-15 DIAGNOSIS — I480 Paroxysmal atrial fibrillation: Secondary | ICD-10-CM

## 2017-06-15 MED ORDER — PANTOPRAZOLE SODIUM 40 MG PO TBEC: 40.0000 mg | DELAYED_RELEASE_TABLET | Freq: Every day | ORAL | 3 refills | Status: AC

## 2017-06-15 MED ORDER — POTASSIUM CHLORIDE CRYS ER 20 MEQ PO TBCR: 20.0000 meq | EXTENDED_RELEASE_TABLET | Freq: Two times a day (BID) | ORAL | 6 refills | Status: AC

## 2017-06-15 MED ORDER — TORSEMIDE 20 MG PO TABS: 40.0000 mg | ORAL_TABLET | Freq: Two times a day (BID) | ORAL | 3 refills | Status: AC

## 2017-06-15 NOTE — Patient Instructions (Addendum)
Try to cut back on your water/fluid intake Drink when thirsty, but not extra Cut back on ALL beverages Try to cut everything in 1/2   Medication Instructions:   1. STOP Furosemide (Lasix) 2. INCREASE Potassium 20 meq Twice Daily 3. START Torsemide 20 mg 2 tablets in the AM and 2 tablets in the PM  We will call [facility] to assist with monitoring weight and assessments.  Labwork:  No new labs needed  Testing/Procedures:  No further testing at this time   Follow-Up: You have been referred to CHF Clinic upstairs.   It was a pleasure seeing you in the office today. Please call us if you have new issues that need to be addressed before your next appt.  [phone]  Your physician wants you to follow-up in: 1 month.  You will receive a reminder letter in the mail two months in advance. If you don't receive a letter, please call our office to schedule the follow-up appointment.  If you need a refill on your cardiac medications before your next appointment, please call your pharmacy.  For educational health videos Log in to : [email or web address] Or : [email or web address], password : [email or web address]

## 2017-06-16 ENCOUNTER — Other Ambulatory Visit: Payer: Self-pay | Admitting: General Surgery

## 2017-06-16 DIAGNOSIS — I214 Non-ST elevation (NSTEMI) myocardial infarction: Secondary | ICD-10-CM | POA: Diagnosis not present

## 2017-06-16 DIAGNOSIS — D510 Vitamin B12 deficiency anemia due to intrinsic factor deficiency: Secondary | ICD-10-CM | POA: Diagnosis not present

## 2017-06-16 DIAGNOSIS — I5033 Acute on chronic diastolic (congestive) heart failure: Secondary | ICD-10-CM | POA: Diagnosis not present

## 2017-06-16 DIAGNOSIS — I951 Orthostatic hypotension: Secondary | ICD-10-CM | POA: Diagnosis not present

## 2017-06-16 DIAGNOSIS — B351 Tinea unguium: Secondary | ICD-10-CM | POA: Diagnosis not present

## 2017-06-16 DIAGNOSIS — K265 Chronic or unspecified duodenal ulcer with perforation: Secondary | ICD-10-CM | POA: Diagnosis not present

## 2017-06-16 DIAGNOSIS — Z170 Estrogen receptor positive status [ER+]: Principal | ICD-10-CM

## 2017-06-16 DIAGNOSIS — I4891 Unspecified atrial fibrillation: Secondary | ICD-10-CM | POA: Diagnosis not present

## 2017-06-16 DIAGNOSIS — E119 Type 2 diabetes mellitus without complications: Secondary | ICD-10-CM | POA: Diagnosis not present

## 2017-06-16 DIAGNOSIS — C50912 Malignant neoplasm of unspecified site of left female breast: Secondary | ICD-10-CM

## 2017-06-16 DIAGNOSIS — K551 Chronic vascular disorders of intestine: Secondary | ICD-10-CM | POA: Diagnosis not present

## 2017-06-16 DIAGNOSIS — J449 Chronic obstructive pulmonary disease, unspecified: Secondary | ICD-10-CM | POA: Diagnosis not present

## 2017-06-16 DIAGNOSIS — C50412 Malignant neoplasm of upper-outer quadrant of left female breast: Secondary | ICD-10-CM | POA: Diagnosis not present

## 2017-06-17 ENCOUNTER — Ambulatory Visit: Payer: Medicare Other | Admitting: Urology

## 2017-06-17 VITALS — BP 98/62 | HR 86 | Wt 188.0 lb

## 2017-06-17 DIAGNOSIS — N200 Calculus of kidney: Secondary | ICD-10-CM

## 2017-06-17 DIAGNOSIS — N2889 Other specified disorders of kidney and ureter: Secondary | ICD-10-CM | POA: Diagnosis not present

## 2017-06-17 NOTE — Progress Notes (Signed)
06/17/2017 11:46 AM   Mia Padilla Feb 01, 1935 [id number]  Referring provider: [name], MD [address] [place], [place] [address]  Chief Complaint  Patient presents with  . Renal Mass    3 month    HPI: 83 year old African-American female with multiple medical comorbidities with incidental possible left upper pole renal mass returns to follow-up MRI today.  MRI on 06/10/2017 shows Bosniak 2 right upper pole renal cyst, 2.4 cm.  No other concerning lesions.    She denies personal history of flank pain or gross hematuria.    Small bilateral kidney stones.    She does have baseline urinary symptoms including urinary frequency, urgency, difficulty ambulating to the bathroom, and incontinence.  This is stable and unchanged.  PMH: Past Medical History:  Diagnosis Date  . Arthritis    hands  . Black head   . Bowel incontinence   . Breast cancer ([place]) 11/15/2013  . Breast cancer of upper-outer quadrant of left female breast ([place]) 2015   Left  . Carotid artery stenosis    a. ultrasound 0/7121: 9-75 LICA stenosis, 88-32% RICA stenosis; b. followed by vascular  . Carotid stenosis 11/17/2016  . Carpal tunnel syndrome   . Chronic combined systolic and diastolic CHF, NYHA class 3 ([facility])    a. TTE 10/18: EF 40%, diffuse HK, DD, calcifief mitral annulus with mild MR, mildly dilated left atrium, normal RV cavity size and systolic function, PASP 54-98 mmHg  . COPD (chronic obstructive pulmonary disease) ([facility])   . Debilitated   . Diabetes mellitus with complication ([facility])   . Glaucoma   . History of seizures   . History of stress test    a. 11/18: small defect of mild severity present in the apex location felt to be 2/2 breast attenuation. Normal study  . Hyperlipidemia   . Hypertension   . Hypomagnesemia   . Hypotension   . Hypothyroidism 02/07/2015  . Mesenteric artery stenosis ([place] [facility] [place] [facility])   . Obese 06/04/2011  . Obesity   . PAF (paroxysmal atrial fibrillation) ([facility])    a. on  Eliquis; b. CHADS2VASc 8 (CHF, HTN, age x 2, DM, vascular disease, sex category)  . Perforated duodenal ulcer ([facility]) 01/2017   a. conservatively managed  . Peripheral vascular disease ([facility]) 04/23/2015  . SMA stenosis ([place])    a. noted 10/18; b. conservatively managed; c. unclear if thrombus vs plaque; d. on Eliquis given PAF and Lipitor  . Urinary incontinence     Surgical History: Past Surgical History:  Procedure Laterality Date  . BOWEL RESECTION  2005  . BREAST SURGERY  08/03/13   left mastectomy  . CARPAL TUNNEL RELEASE Right 30 years   . MASTECTOMY     left   . PARTIAL HYSTERECTOMY    . TONSILLECTOMY    . UMBILICAL HERNIA REPAIR      Home Medications:  Allergies as of 06/17/2017      Reactions   Propoxyphene Other (See Comments)   Other Reaction: Severe Headache   Advil [ibuprofen] Other (See Comments)   Makes her heart race   Darvocet [propoxyphene N-acetaminophen]    Percocet [oxycodone-acetaminophen]    hallucination      Medication List        Accurate as of 06/17/17 11:46 AM. Always use your most recent med list.          albuterol 108 (90 Base) MCG/ACT inhaler Commonly known as:  PROVENTIL HFA;VENTOLIN HFA Inhale 2 puffs into the lungs every  6 (six) hours as needed for wheezing or shortness of breath.   alendronate 70 MG tablet Commonly known as:  FOSAMAX Take 70 mg by mouth once a week. Take with a full glass of water on an empty stomach.   amLODipine-olmesartan 5-40 MG tablet Commonly known as:  AZOR Take 1 tablet by mouth daily.   anastrozole 1 MG tablet Commonly known as:  ARIMIDEX TAKE 1 TABLET BY MOUTH ONCE DAILY   benzonatate 100 MG capsule Commonly known as:  TESSALON Take by mouth 3 (three) times daily as needed for cough.   blood glucose meter kit and supplies Diagnosis:E11.9 Test glucose twice daily   brimonidine 0.1 % Soln Commonly known as:  ALPHAGAN P 2 (two) times daily.   cyanocobalamin 1000 MCG tablet Take 1,000 mcg by  mouth daily.   digoxin 0.125 MG tablet Commonly known as:  LANOXIN Take by mouth daily.   dorzolamide 2 % ophthalmic solution Commonly known as:  TRUSOPT 1 drop 2 (two) times daily.   famotidine 20 MG tablet Commonly known as:  PEPCID Take 20 mg by mouth 2 (two) times daily.   feeding supplement (ENSURE ENLIVE) Liqd Take 237 mLs by mouth 2 (two) times daily between meals.   fluticasone 50 MCG/ACT nasal spray Commonly known as:  FLONASE Place 2 sprays into both nostrils daily.   Fluticasone-Salmeterol 250-50 MCG/DOSE Aepb Commonly known as:  ADVAIR Inhale 1 puff into the lungs every 12 (twelve) hours.   glucose blood test strip Use as instructed   latanoprost 0.005 % ophthalmic solution Commonly known as:  XALATAN Place 1 drop into both eyes at bedtime.   LEVEMIR FLEXTOUCH 100 UNIT/ML Pen Generic drug:  Insulin Detemir Inject 6 Units into the skin daily at 10 pm. INJECT 6 UNITS PER DAY   levothyroxine 100 MCG tablet Commonly known as:  SYNTHROID, LEVOTHROID Take 1 tablet (100 mcg total) by mouth daily.   magnesium oxide 400 MG tablet Commonly known as:  MAG-OX Take 1 tablet (400 mg total) by mouth 2 (two) times daily.   meclizine 25 MG tablet Commonly known as:  ANTIVERT Take 1 tablet (25 mg total) 3 (three) times daily as needed by mouth.   metoprolol succinate 100 MG 24 hr tablet Commonly known as:  TOPROL-XL Take 1 tablet (100 mg total) by mouth daily.   mometasone 0.1 % cream Commonly known as:  ELOCON Apply 1 application topically daily. Apply 1-2 times daily for no longer than 2 weeks at a time   mometasone-formoterol 200-5 MCG/ACT Aero Commonly known as:  DULERA Inhale 2 puffs into the lungs 2 (two) times daily.   mupirocin ointment 2 % Commonly known as:  BACTROBAN Apply 1 application topically 2 (two) times daily. Do not use day in and day out   pantoprazole 40 MG tablet Commonly known as:  PROTONIX Take 1 tablet (40 mg total) by mouth  daily.   PATADAY 0.2 % Soln Generic drug:  Olopatadine HCl Apply 1 drop to eye daily.   potassium chloride SA 20 MEQ tablet Commonly known as:  K-DUR,KLOR-CON Take 1 tablet (20 mEq total) by mouth 2 (two) times daily.   solifenacin 10 MG tablet Commonly known as:  VESICARE Take 1 tablet (10 mg total) daily by mouth.   torsemide 20 MG tablet Commonly known as:  DEMADEX Take 2 tablets (40 mg total) by mouth 2 (two) times daily.       Allergies:  Allergies  Allergen Reactions  . Propoxyphene Other (See  Comments)    Other Reaction: Severe Headache  . Advil [Ibuprofen] Other (See Comments)    Makes her heart race  . Darvocet [Propoxyphene N-Acetaminophen]   . Percocet [Oxycodone-Acetaminophen]     hallucination    Family History: Family History  Problem Relation Age of Onset  . Cervical cancer Mother   . Diabetes Mother   . Glaucoma Mother   . Cervical cancer Daughter   . Hypertension Daughter   . Hypertension Father   . Hypertension Sister   . Hypertension Brother   . Hypertension Son   . Diabetes Brother   . HIV Brother   . Breast cancer Neg Hx     Social History:  reports that she quit smoking about 43 years ago. she has never used smokeless tobacco. She reports that she does not drink alcohol or use drugs.  ROS: UROLOGY Frequent Urination?: Yes Hard to postpone urination?: Yes Burning/pain with urination?: No Get up at night to urinate?: Yes Leakage of urine?: Yes Urine stream starts and stops?: Yes Trouble starting stream?: No Do you have to strain to urinate?: No Blood in urine?: No Urinary tract infection?: Yes Sexually transmitted disease?: No Injury to kidneys or bladder?: No Painful intercourse?: No Weak stream?: No Currently pregnant?: No Vaginal bleeding?: No Last menstrual period?: n  Gastrointestinal Nausea?: No Vomiting?: No Indigestion/heartburn?: No Diarrhea?: No Constipation?: No  Constitutional Fever: No Night sweats?:  No Weight loss?: No Fatigue?: No  Skin Skin rash/lesions?: No Itching?: No  Eyes Blurred vision?: Yes Double vision?: No  Ears/Nose/Throat Sore throat?: No Sinus problems?: Yes  Hematologic/Lymphatic Swollen glands?: No Easy bruising?: No  Cardiovascular Leg swelling?: Yes Chest pain?: No  Respiratory Cough?: No Shortness of breath?: Yes  Endocrine Excessive thirst?: No  Musculoskeletal Back pain?: No Joint pain?: No  Neurological Headaches?: Yes Dizziness?: Yes  Psychologic Depression?: No Anxiety?: No  Physical Exam: BP 98/62   Pulse 86   Wt 188 lb (85.3 kg)   SpO2 94%   BMI 33.30 kg/m  Constitutional:  Alert and oriented, No acute distress. Elderly.  Ambulating with walker.  Complaint today by family member. HEENT: [place] AT, moist mucus membranes.  Trachea midline, no masses. Cardiovascular: Bilateral LE edema appreciated.   Respiratory: Wearing 02.  Neurologic: Grossly intact, no focal deficits, moving all 4 extremities. Psychiatric: Normal mood and affect.  Laboratory Data: Lab Results  Component Value Date   WBC 2.8 (L) 06/02/2017   HGB 11.3 (L) 06/02/2017   HCT 34.2 (L) 06/02/2017   MCV 102.4 (H) 06/02/2017   PLT 170 06/02/2017    Lab Results  Component Value Date   CREATININE 0.94 06/02/2017    Lab Results  Component Value Date   HGBA1C 5.8 (H) 01/24/2017    Urinalysis n/a  Pertinent Imaging: CLINICAL DATA:  Evaluate renal mass.  EXAM: MRI ABDOMEN WITHOUT AND WITH CONTRAST  TECHNIQUE: Multiplanar multisequence MR imaging of the abdomen was performed both before and after the administration of intravenous contrast.  CONTRAST:  40m MULTIHANCE GADOBENATE DIMEGLUMINE 529 MG/ML IV SOLN  COMPARISON:  CT 02/22/2017  FINDINGS: Lower chest: No acute findings.  Hepatobiliary: No focal liver abnormality. No gallstones or gallbladder wall thickening. No biliary dilatation.  Pancreas: No mass, inflammatory changes, or  other parenchymal abnormality identified.  Spleen:  Within normal limits in size and appearance.  Adrenals/Urinary Tract: The adrenal glands are unremarkable. Cyst arising from the upper pole of the right kidney measures 2.4 cm and contains a thin internal area of  hairline septation, image 7 of series 5. No internal enhancement identified following the IV administration of contrast material. Arising from the posterior cortex of the left kidney is a T2 hyperintense and T1 hypointense structure measuring 2.5 cm. No internal enhancement visualized. No solid kidney lesions identified. No hydronephrosis.  Stomach/Bowel: The stomach appears within normal limits. No abnormal bowel dilatation identified within the visualized portions of the abdomen.  Vascular/Lymphatic: Aortic atherosclerosis. No aneurysm. No abdominal adenopathy.  Other:  No free fluid or fluid collections.  Musculoskeletal: Normal signal from within the bone marrow.  IMPRESSION: 1. Bosniak category 2 cyst arises from the upper pole of the right kidney. Bosniak category 1 cyst arises from the posterior aspect of the upper pole of left kidney. No solid kidney mass identified. 2.  Aortic Atherosclerosis (ICD10-I70.0).   Electronically Signed   By: Kerby Moors M.D.   On: 06/10/2017 11:06  MRI personally reviewed today with the patient.  Assessment & Plan:    1. Right renal cyst Bosniak 2 renal cyst, 2.4 cm Minimal risk of malignancy No further surveillance of follow up indicated  2. Nephrolithiasis Multiple bilateral stones measuring up to 5 mm Currently asymptomatic, would recommend continued observation  F/u prn  Hollice Espy, MD  [facility] [address] [place], [place] [address] [phone]

## 2017-06-18 DIAGNOSIS — E119 Type 2 diabetes mellitus without complications: Secondary | ICD-10-CM | POA: Diagnosis not present

## 2017-06-18 DIAGNOSIS — I5033 Acute on chronic diastolic (congestive) heart failure: Secondary | ICD-10-CM | POA: Diagnosis not present

## 2017-06-18 DIAGNOSIS — K551 Chronic vascular disorders of intestine: Secondary | ICD-10-CM | POA: Diagnosis not present

## 2017-06-18 DIAGNOSIS — C50412 Malignant neoplasm of upper-outer quadrant of left female breast: Secondary | ICD-10-CM | POA: Diagnosis not present

## 2017-06-18 DIAGNOSIS — D510 Vitamin B12 deficiency anemia due to intrinsic factor deficiency: Secondary | ICD-10-CM | POA: Diagnosis not present

## 2017-06-18 DIAGNOSIS — I214 Non-ST elevation (NSTEMI) myocardial infarction: Secondary | ICD-10-CM | POA: Diagnosis not present

## 2017-06-18 DIAGNOSIS — K265 Chronic or unspecified duodenal ulcer with perforation: Secondary | ICD-10-CM | POA: Diagnosis not present

## 2017-06-18 DIAGNOSIS — I4891 Unspecified atrial fibrillation: Secondary | ICD-10-CM | POA: Diagnosis not present

## 2017-06-18 DIAGNOSIS — J449 Chronic obstructive pulmonary disease, unspecified: Secondary | ICD-10-CM | POA: Diagnosis not present

## 2017-06-18 DIAGNOSIS — I951 Orthostatic hypotension: Secondary | ICD-10-CM | POA: Diagnosis not present

## 2017-06-21 DIAGNOSIS — C50412 Malignant neoplasm of upper-outer quadrant of left female breast: Secondary | ICD-10-CM | POA: Diagnosis not present

## 2017-06-21 DIAGNOSIS — E119 Type 2 diabetes mellitus without complications: Secondary | ICD-10-CM | POA: Diagnosis not present

## 2017-06-21 DIAGNOSIS — I951 Orthostatic hypotension: Secondary | ICD-10-CM | POA: Diagnosis not present

## 2017-06-21 DIAGNOSIS — J449 Chronic obstructive pulmonary disease, unspecified: Secondary | ICD-10-CM | POA: Diagnosis not present

## 2017-06-21 DIAGNOSIS — D510 Vitamin B12 deficiency anemia due to intrinsic factor deficiency: Secondary | ICD-10-CM | POA: Diagnosis not present

## 2017-06-21 DIAGNOSIS — K551 Chronic vascular disorders of intestine: Secondary | ICD-10-CM | POA: Diagnosis not present

## 2017-06-21 DIAGNOSIS — I5033 Acute on chronic diastolic (congestive) heart failure: Secondary | ICD-10-CM | POA: Diagnosis not present

## 2017-06-21 DIAGNOSIS — I214 Non-ST elevation (NSTEMI) myocardial infarction: Secondary | ICD-10-CM | POA: Diagnosis not present

## 2017-06-21 DIAGNOSIS — K265 Chronic or unspecified duodenal ulcer with perforation: Secondary | ICD-10-CM | POA: Diagnosis not present

## 2017-06-21 DIAGNOSIS — I4891 Unspecified atrial fibrillation: Secondary | ICD-10-CM | POA: Diagnosis not present

## 2017-06-22 ENCOUNTER — Ambulatory Visit
Admission: RE | Admit: 2017-06-22 | Discharge: 2017-06-22 | Disposition: A | Discharge status: Home / Self Care | Payer: Medicare Other | Source: Ambulatory Visit | Attending: General Surgery | Admitting: General Surgery

## 2017-06-22 DIAGNOSIS — Z1231 Encounter for screening mammogram for malignant neoplasm of breast: Secondary | ICD-10-CM | POA: Insufficient documentation

## 2017-06-23 DIAGNOSIS — K551 Chronic vascular disorders of intestine: Secondary | ICD-10-CM | POA: Diagnosis not present

## 2017-06-23 DIAGNOSIS — C50412 Malignant neoplasm of upper-outer quadrant of left female breast: Secondary | ICD-10-CM | POA: Diagnosis not present

## 2017-06-23 DIAGNOSIS — D510 Vitamin B12 deficiency anemia due to intrinsic factor deficiency: Secondary | ICD-10-CM | POA: Diagnosis not present

## 2017-06-23 DIAGNOSIS — I4891 Unspecified atrial fibrillation: Secondary | ICD-10-CM | POA: Diagnosis not present

## 2017-06-23 DIAGNOSIS — I951 Orthostatic hypotension: Secondary | ICD-10-CM | POA: Diagnosis not present

## 2017-06-23 DIAGNOSIS — I5033 Acute on chronic diastolic (congestive) heart failure: Secondary | ICD-10-CM | POA: Diagnosis not present

## 2017-06-23 DIAGNOSIS — K265 Chronic or unspecified duodenal ulcer with perforation: Secondary | ICD-10-CM | POA: Diagnosis not present

## 2017-06-23 DIAGNOSIS — E119 Type 2 diabetes mellitus without complications: Secondary | ICD-10-CM | POA: Diagnosis not present

## 2017-06-23 DIAGNOSIS — J449 Chronic obstructive pulmonary disease, unspecified: Secondary | ICD-10-CM | POA: Diagnosis not present

## 2017-06-23 DIAGNOSIS — I214 Non-ST elevation (NSTEMI) myocardial infarction: Secondary | ICD-10-CM | POA: Diagnosis not present

## 2017-06-25 DIAGNOSIS — K265 Chronic or unspecified duodenal ulcer with perforation: Secondary | ICD-10-CM | POA: Diagnosis not present

## 2017-06-25 DIAGNOSIS — K551 Chronic vascular disorders of intestine: Secondary | ICD-10-CM | POA: Diagnosis not present

## 2017-06-25 DIAGNOSIS — I214 Non-ST elevation (NSTEMI) myocardial infarction: Secondary | ICD-10-CM | POA: Diagnosis not present

## 2017-06-25 DIAGNOSIS — E119 Type 2 diabetes mellitus without complications: Secondary | ICD-10-CM | POA: Diagnosis not present

## 2017-06-25 DIAGNOSIS — D510 Vitamin B12 deficiency anemia due to intrinsic factor deficiency: Secondary | ICD-10-CM | POA: Diagnosis not present

## 2017-06-25 DIAGNOSIS — J449 Chronic obstructive pulmonary disease, unspecified: Secondary | ICD-10-CM | POA: Diagnosis not present

## 2017-06-25 DIAGNOSIS — I5033 Acute on chronic diastolic (congestive) heart failure: Secondary | ICD-10-CM | POA: Diagnosis not present

## 2017-06-25 DIAGNOSIS — C50412 Malignant neoplasm of upper-outer quadrant of left female breast: Secondary | ICD-10-CM | POA: Diagnosis not present

## 2017-06-25 DIAGNOSIS — I4891 Unspecified atrial fibrillation: Secondary | ICD-10-CM | POA: Diagnosis not present

## 2017-06-25 DIAGNOSIS — I951 Orthostatic hypotension: Secondary | ICD-10-CM | POA: Diagnosis not present

## 2017-06-28 DIAGNOSIS — C50412 Malignant neoplasm of upper-outer quadrant of left female breast: Secondary | ICD-10-CM | POA: Diagnosis not present

## 2017-06-28 DIAGNOSIS — K265 Chronic or unspecified duodenal ulcer with perforation: Secondary | ICD-10-CM | POA: Diagnosis not present

## 2017-06-28 DIAGNOSIS — E119 Type 2 diabetes mellitus without complications: Secondary | ICD-10-CM | POA: Diagnosis not present

## 2017-06-28 DIAGNOSIS — I951 Orthostatic hypotension: Secondary | ICD-10-CM | POA: Diagnosis not present

## 2017-06-28 DIAGNOSIS — I214 Non-ST elevation (NSTEMI) myocardial infarction: Secondary | ICD-10-CM | POA: Diagnosis not present

## 2017-06-28 DIAGNOSIS — J449 Chronic obstructive pulmonary disease, unspecified: Secondary | ICD-10-CM | POA: Diagnosis not present

## 2017-06-28 DIAGNOSIS — I5033 Acute on chronic diastolic (congestive) heart failure: Secondary | ICD-10-CM | POA: Diagnosis not present

## 2017-06-28 DIAGNOSIS — I4891 Unspecified atrial fibrillation: Secondary | ICD-10-CM | POA: Diagnosis not present

## 2017-06-28 DIAGNOSIS — K551 Chronic vascular disorders of intestine: Secondary | ICD-10-CM | POA: Diagnosis not present

## 2017-06-28 DIAGNOSIS — D510 Vitamin B12 deficiency anemia due to intrinsic factor deficiency: Secondary | ICD-10-CM | POA: Diagnosis not present

## 2017-06-29 ENCOUNTER — Ambulatory Visit: Payer: Medicare Other | Admitting: General Surgery

## 2017-06-29 ENCOUNTER — Encounter: Payer: Self-pay | Admitting: General Surgery

## 2017-06-29 ENCOUNTER — Ambulatory Visit (INDEPENDENT_AMBULATORY_CARE_PROVIDER_SITE_OTHER): Payer: Medicare Other | Admitting: General Surgery

## 2017-06-29 VITALS — BP 90/50 | HR 100 | Resp 17 | Ht 60.0 in | Wt 181.0 lb

## 2017-06-29 DIAGNOSIS — Z170 Estrogen receptor positive status [ER+]: Secondary | ICD-10-CM | POA: Diagnosis not present

## 2017-06-29 DIAGNOSIS — C50412 Malignant neoplasm of upper-outer quadrant of left female breast: Secondary | ICD-10-CM

## 2017-06-29 NOTE — Patient Instructions (Signed)
Patient will be asked to return to the office in one year with a right breast screening mammogram. The patient is aware to call back for any questions or concerns. 

## 2017-06-29 NOTE — Progress Notes (Signed)
Patient ID: Mia Padilla, female   DOB: 1934-07-01, 83 y.o.   MRN: [id number]  Chief Complaint  Patient presents with  . Follow-up    mammogram    HPI Mia Padilla is a 83 y.o. female who presents for a breast evaluation. The most recent right breast mammogram was done on 06/22/17. Patient does perform regular self breast checks and gets regular mammograms done. She has no new breast complaints. She is here today with her granddaughter Mia Padilla and her great grandaughter.  She has been having problems with fluid buildup and is being treated for this. It makes her short of breath. She say it is getting better.    HPI  Past Medical History:  Diagnosis Date  . Arthritis    hands  . Black head   . Bowel incontinence   . Breast cancer ([facility]) 11/15/2013  . Breast cancer of upper-outer quadrant of left female breast ([facility]) 2015   Left  . Carotid artery stenosis    a. ultrasound 07/979: 1-91 LICA stenosis, 47-82% RICA stenosis; b. followed by vascular  . Carotid stenosis 11/17/2016  . Carpal tunnel syndrome   . Chronic combined systolic and diastolic CHF, NYHA class 3 ([facility])    a. TTE 10/18: EF 40%, diffuse HK, DD, calcifief mitral annulus with mild MR, mildly dilated left atrium, normal RV cavity size and systolic function, PASP 95-62 mmHg  . COPD (chronic obstructive pulmonary disease) ([facility])   . Debilitated   . Diabetes mellitus with complication ([facility])   . Glaucoma   . History of seizures   . History of stress test    a. 11/18: small defect of mild severity present in the apex location felt to be 2/2 breast attenuation. Normal study  . Hyperlipidemia   . Hypertension   . Hypomagnesemia   . Hypotension   . Hypothyroidism 02/07/2015  . Mesenteric artery stenosis ([facility])   . Obese 06/04/2011  . Obesity   . PAF (paroxysmal atrial fibrillation) ([facility])    a. on Eliquis; b. CHADS2VASc 8 (CHF, HTN, age x 2, DM, vascular disease, sex category)  . Perforated duodenal ulcer ([facility]) 01/2017   a.  conservatively managed  . Peripheral vascular disease ([facility]) 04/23/2015  . SMA stenosis ([facility])    a. noted 10/18; b. conservatively managed; c. unclear if thrombus vs plaque; d. on Eliquis given PAF and Lipitor  . Urinary incontinence     Past Surgical History:  Procedure Laterality Date  . BOWEL RESECTION  2005  . BREAST SURGERY  08/03/13   left mastectomy  . CARPAL TUNNEL RELEASE Right 30 years   . MASTECTOMY     left   . PARTIAL HYSTERECTOMY    . TONSILLECTOMY    . UMBILICAL HERNIA REPAIR      Family History  Problem Relation Age of Onset  . Cervical cancer Mother   . Diabetes Mother   . Glaucoma Mother   . Cervical cancer Daughter   . Hypertension Daughter   . Hypertension Father   . Hypertension Sister   . Hypertension Brother   . Hypertension Son   . Diabetes Brother   . HIV Brother   . Breast cancer Neg Hx     Social History Social History   Tobacco Use  . Smoking status: Former Smoker    Last attempt to quit: 04/20/1974    Years since quitting: 43.2  . Smokeless tobacco: Never Used  . Tobacco comment: Quit 1976  Substance Use Topics  .  Alcohol use: No  . Drug use: No    Allergies  Allergen Reactions  . Propoxyphene Other (See Comments)    Other Reaction: Severe Headache  . Advil [Ibuprofen] Other (See Comments)    Makes her heart race  . Darvocet [Propoxyphene N-Acetaminophen]   . Percocet [Oxycodone-Acetaminophen]     hallucination    Current Outpatient Medications  Medication Sig Dispense Refill  . albuterol (PROVENTIL HFA;VENTOLIN HFA) 108 (90 Base) MCG/ACT inhaler Inhale 2 puffs into the lungs every 6 (six) hours as needed for wheezing or shortness of breath. 1 Inhaler 0  . alendronate (FOSAMAX) 70 MG tablet Take 70 mg by mouth once a week. Take with a full glass of water on an empty stomach.    Marland Kitchen amLODipine-olmesartan (AZOR) 5-40 MG tablet Take 1 tablet by mouth daily.    Marland Kitchen anastrozole (ARIMIDEX) 1 MG tablet TAKE 1 TABLET BY MOUTH ONCE DAILY 90  tablet 4  . benzonatate (TESSALON) 100 MG capsule Take by mouth 3 (three) times daily as needed for cough.    . blood glucose meter kit and supplies Diagnosis:E11.9 Test glucose twice daily 1 each 12  . brimonidine (ALPHAGAN P) 0.1 % SOLN 2 (two) times daily.     . cyanocobalamin 1000 MCG tablet Take 1,000 mcg by mouth daily.    . digoxin (LANOXIN) 0.125 MG tablet Take by mouth daily.    . dorzolamide (TRUSOPT) 2 % ophthalmic solution 1 drop 2 (two) times daily.     . famotidine (PEPCID) 20 MG tablet Take 20 mg by mouth 2 (two) times daily.    . feeding supplement, ENSURE ENLIVE, (ENSURE ENLIVE) LIQD Take 237 mLs by mouth 2 (two) times daily between meals. 60 Bottle 0  . fluticasone (FLONASE) 50 MCG/ACT nasal spray Place 2 sprays into both nostrils daily. 16 g 12  . Fluticasone-Salmeterol (ADVAIR) 250-50 MCG/DOSE AEPB Inhale 1 puff into the lungs every 12 (twelve) hours. 180 each 4  . glucose blood test strip Use as instructed 100 each 12  . latanoprost (XALATAN) 0.005 % ophthalmic solution Place 1 drop into both eyes at bedtime.    Marland Kitchen LEVEMIR FLEXTOUCH 100 UNIT/ML Pen Inject 6 Units into the skin daily at 10 pm. INJECT 6 UNITS PER DAY 15 mL 11  . levothyroxine (SYNTHROID, LEVOTHROID) 100 MCG tablet Take 1 tablet (100 mcg total) by mouth daily. 90 tablet 4  . magnesium oxide (MAG-OX) 400 MG tablet Take 1 tablet (400 mg total) by mouth 2 (two) times daily. 60 tablet 3  . meclizine (ANTIVERT) 25 MG tablet Take 1 tablet (25 mg total) 3 (three) times daily as needed by mouth. 90 tablet 1  . metoprolol succinate (TOPROL-XL) 100 MG 24 hr tablet Take 1 tablet (100 mg total) by mouth daily. 90 tablet 4  . mometasone (ELOCON) 0.1 % cream Apply 1 application topically daily. Apply 1-2 times daily for no longer than 2 weeks at a time 50 g 3  . mometasone-formoterol (DULERA) 200-5 MCG/ACT AERO Inhale 2 puffs into the lungs 2 (two) times daily. 13 g 12  . mupirocin ointment (BACTROBAN) 2 % Apply 1 application  topically 2 (two) times daily. Do not use day in and day out 22 g 2  . Olopatadine HCl (PATADAY) 0.2 % SOLN Apply 1 drop to eye daily.     . pantoprazole (PROTONIX) 40 MG tablet Take 1 tablet (40 mg total) by mouth daily. 90 tablet 3  . potassium chloride SA (K-DUR,KLOR-CON) 20 MEQ tablet Take  1 tablet (20 mEq total) by mouth 2 (two) times daily. 30 tablet 6  . solifenacin (VESICARE) 10 MG tablet Take 1 tablet (10 mg total) daily by mouth. 90 tablet 2  . torsemide (DEMADEX) 20 MG tablet Take 2 tablets (40 mg total) by mouth 2 (two) times daily. 360 tablet 3   No current facility-administered medications for this visit.     Review of Systems Review of Systems  Constitutional: Positive for fatigue. Negative for activity change, appetite change, chills, diaphoresis, fever and unexpected weight change.  Respiratory: Positive for shortness of breath. Negative for apnea, cough, choking, chest tightness, wheezing and stridor.   Cardiovascular: Negative.     Blood pressure (!) 90/50, pulse 100, resp. rate 17, height 5' (1.524 m), weight 181 lb (82.1 kg).  Physical Exam Physical Exam  Constitutional: She is oriented to person, place, and time. She appears well-developed and well-nourished.  Cardiovascular: Normal rate, regular rhythm and normal heart sounds.  Marked bilateral lower extremity edema  Pulmonary/Chest: Effort normal and breath sounds normal. Right breast exhibits no inverted nipple, no mass, no nipple discharge, no skin change and no tenderness.    Left mastectomy site is clean and healing well.   Lymphadenopathy:    She has no axillary adenopathy.       Right: No supraclavicular adenopathy present.       Left: No supraclavicular adenopathy present.  Neurological: She is alert and oriented to person, place, and time.  Skin: Skin is warm and dry.  The patient exhibits excellent shoulder range of motion.  Data Reviewed June 22, 2017 right breast screening mammogram reviewed.   No evidence of malignancy.  BI-RADS-1.  Assessment    Stable breast exam.    Plan    Patient will be asked to return to the office in one year with a right breast screening mammogram.The patient is aware to call back for any questions or concerns.   HPI, Physical Exam, Assessment and Plan have been scribed under the direction and in the presence of Robert Bellow, MD  Concepcion Living, LPN  I have completed the exam and reviewed the above documentation for accuracy and completeness.  I agree with the above.  Haematologist has been used and any errors in dictation or transcription are unintentional.  Hervey Ard, M.D., F.A.C.S.  Forest Gleason Tamara Kenyon 06/30/2017, 8:38 PM

## 2017-06-30 ENCOUNTER — Encounter: Payer: Self-pay | Admitting: Emergency Medicine

## 2017-06-30 ENCOUNTER — Emergency Department
Admission: EM | Admit: 2017-06-30 | Discharge: 2017-06-30 | Disposition: A | Discharge status: Home / Self Care | Payer: Medicare Other | Attending: Emergency Medicine | Admitting: Emergency Medicine

## 2017-06-30 ENCOUNTER — Telehealth: Payer: Self-pay | Admitting: Cardiovascular Disease

## 2017-06-30 ENCOUNTER — Other Ambulatory Visit: Payer: Self-pay

## 2017-06-30 ENCOUNTER — Emergency Department: Payer: Medicare Other

## 2017-06-30 DIAGNOSIS — Y92129 Unspecified place in nursing home as the place of occurrence of the external cause: Secondary | ICD-10-CM | POA: Insufficient documentation

## 2017-06-30 DIAGNOSIS — M25511 Pain in right shoulder: Secondary | ICD-10-CM

## 2017-06-30 DIAGNOSIS — Z87891 Personal history of nicotine dependence: Secondary | ICD-10-CM | POA: Insufficient documentation

## 2017-06-30 DIAGNOSIS — J449 Chronic obstructive pulmonary disease, unspecified: Secondary | ICD-10-CM | POA: Insufficient documentation

## 2017-06-30 DIAGNOSIS — W010XXA Fall on same level from slipping, tripping and stumbling without subsequent striking against object, initial encounter: Secondary | ICD-10-CM | POA: Insufficient documentation

## 2017-06-30 DIAGNOSIS — Y939 Activity, unspecified: Secondary | ICD-10-CM | POA: Diagnosis not present

## 2017-06-30 DIAGNOSIS — I110 Hypertensive heart disease with heart failure: Secondary | ICD-10-CM | POA: Insufficient documentation

## 2017-06-30 DIAGNOSIS — W19XXXA Unspecified fall, initial encounter: Secondary | ICD-10-CM

## 2017-06-30 DIAGNOSIS — Z853 Personal history of malignant neoplasm of breast: Secondary | ICD-10-CM | POA: Insufficient documentation

## 2017-06-30 DIAGNOSIS — M25561 Pain in right knee: Secondary | ICD-10-CM

## 2017-06-30 DIAGNOSIS — I5042 Chronic combined systolic (congestive) and diastolic (congestive) heart failure: Secondary | ICD-10-CM | POA: Insufficient documentation

## 2017-06-30 DIAGNOSIS — Y998 Other external cause status: Secondary | ICD-10-CM | POA: Insufficient documentation

## 2017-06-30 DIAGNOSIS — E119 Type 2 diabetes mellitus without complications: Secondary | ICD-10-CM | POA: Diagnosis not present

## 2017-06-30 DIAGNOSIS — S80211A Abrasion, right knee, initial encounter: Secondary | ICD-10-CM | POA: Insufficient documentation

## 2017-06-30 DIAGNOSIS — T148XXA Other injury of unspecified body region, initial encounter: Secondary | ICD-10-CM

## 2017-06-30 DIAGNOSIS — E039 Hypothyroidism, unspecified: Secondary | ICD-10-CM | POA: Diagnosis not present

## 2017-06-30 MED ORDER — ACETAMINOPHEN 325 MG PO TABS
650.0000 mg | ORAL_TABLET | Freq: Once | ORAL | Status: AC
  Administered 2017-06-30: 650 mg via ORAL
  Filled 2017-06-30: qty 2

## 2017-06-30 MED ORDER — BACITRACIN ZINC 500 UNIT/GM EX OINT
1.0000 "application " | TOPICAL_OINTMENT | Freq: Two times a day (BID) | CUTANEOUS | Status: DC
  Administered 2017-06-30: 1 via TOPICAL
  Filled 2017-06-30: qty 0.9

## 2017-06-30 NOTE — Telephone Encounter (Signed)
Pt son calling stating for the past two weeks patient has had two falls  He states it may be caused by the fluid build up Pt is having hard time walking, when she is walking she gets real SOB   She is also complaining about a pressure in her head. She states her head is "filled"  Yesterday granddaughter was trying to take patient to the doctors, she only walked about 20 steps and had to stop for breath, but granddaughter noticed patient was grabbing something in the air, as if she was confused. Then a second later the patient was fine. She is worried that there is something else going on as well.  Please advise

## 2017-06-30 NOTE — Telephone Encounter (Incomplete)
Mia Padilla calling to check on status  Family is concerned on what to do with patient  Would like a call as soon as possible

## 2017-06-30 NOTE — Telephone Encounter (Signed)
I spoke with the patient's son, Shannelle Alguire (ok per DPR). He reports that that patient has had 2 falls since she saw Dr. Rockey Situ on 06/15/17.  - the 1st fall she was going to sit down while holding her walker and misjudged the chair - the 2nd fall was this morning about 3 am, she was getting up to go to the radio with her walker and as she turned, she told the family that her feet wouldn't move and she fell  She was taken to the ER this morning for further evaluation, but released.  Per Mr. Hedeen, the patient has been reaching for things that aren't there- they are noticing this about every day- however, in their discussions with the patient she appears to be oriented to people, places, and events.  Mr. Grenfell also reports that the patient gets very SOB with walking a short distance. When she saw Dr. Rockey Situ on 2/29, her weight was up ? 10 lbs and her furosemide was switched to torsemide 20 mg- 2 tabs (40 mg) BID. She is still taking torsemide and has decreased her fluid intake by 1/2 according to Mr. Hilmer. Her weight has been running 180 lbs - 181 lbs (3/11)- 179 lbs (3/12)- she did not weigh this morning.  I inquired if the patient has been urinating frequently and per her son, she wears depends so he is unsure if there is much difference in her urine output. She does still have bilateral lower extremity edema that his noticeable- the patient's son is unsure if she is experiencing any abdominal swelling.  I also asked if the episodes of the patient "reaching" for things that aren't there are more noticeable since her medication was changed and he states "I think so."  I advised Mr. Tamargo I will forward to Dr. Rockey Situ to review and we will call him back with further recommendations.  Mr. Zamor is agreeable.

## 2017-06-30 NOTE — ED Provider Notes (Signed)
[facility] Emergency Department Provider Note ____________________________________________  Time seen: Approximately 7:27 AM  I have reviewed the triage vital signs and the nursing notes.   HISTORY  Chief Complaint Fall    HPI Mia Padilla is a 83 y.o. female who presents to the emergency department for evaluation and treatment of right shoulder and knee pain after mechanical, non-syncopal fall this morning. She got up to turn on the radio and got her feet tangled in her walker. She did not strike her head and has had no neck or back pain. She typically takes tylenol for pain, but has not taken any since the fall.  Past Medical History:  Diagnosis Date  . Arthritis    hands  . Black head   . Bowel incontinence   . Breast cancer ([place]) 11/15/2013  . Breast cancer of upper-outer quadrant of left female breast ([place]) 2015   Left  . Carotid artery stenosis    a. ultrasound 12/8919: 1-94 LICA stenosis, 17-40% RICA stenosis; b. followed by vascular  . Carotid stenosis 11/17/2016  . Carpal tunnel syndrome   . Chronic combined systolic and diastolic CHF, NYHA class 3 ([place])    a. TTE 10/18: EF 40%, diffuse HK, DD, calcifief mitral annulus with mild MR, mildly dilated left atrium, normal RV cavity size and systolic function, PASP 81-44 mmHg  . COPD (chronic obstructive pulmonary disease) ([place])   . Debilitated   . Diabetes mellitus with complication ([place])   . Glaucoma   . History of seizures   . History of stress test    a. 11/18: small defect of mild severity present in the apex location felt to be 2/2 breast attenuation. Normal study  . Hyperlipidemia   . Hypertension   . Hypomagnesemia   . Hypotension   . Hypothyroidism 02/07/2015  . Mesenteric artery stenosis ([place])   . Obese 06/04/2011  . Obesity   . PAF (paroxysmal atrial fibrillation) (H[facility])    a. on Eliquis; b. CHADS2VASc 8 (CHF, HTN, age x 2, DM, vascular disease, sex category)  . Perforated duodenal  ulcer ([place]) 01/2017   a. conservatively managed  . Peripheral vascular disease ([place]) 04/23/2015  . SMA stenosis ([place])    a. noted 10/18; b. conservatively managed; c. unclear if thrombus vs plaque; d. on Eliquis given PAF and Lipitor  . Urinary incontinence     Patient Active Problem List   Diagnosis Date Noted  . Primary open angle glaucoma of both eyes, severe stage 04/22/2017  . Chronic combined systolic and diastolic CHF, NYHA class 3 ([place])   . PAF (paroxysmal atrial fibrillation) ([place])   . Diabetes mellitus with complication ([place])   . Hypomagnesemia   . Hypokalemia   . Mesenteric artery stenosis ([place])   . Perforated duodenal ulcer ([place]) 01/18/2017  . Advanced care planning/counseling discussion 11/19/2016  . Carotid stenosis 11/17/2016  . Lymphedema 02/14/2016  . Vitamin D deficiency 11/12/2015  . B12 deficiency 11/11/2015  . Peripheral vascular disease ([place]) 04/23/2015  . Osteoarthritis 04/23/2015  . Hypothyroidism 02/07/2015  . Urinary incontinence 02/07/2015  . Malignant neoplasm of upper-outer quadrant of female breast ([place]) 06/28/2013  . Hypertension 10/15/2011  . COPD (chronic obstructive pulmonary disease) ([place]) 06/04/2011  . Seizures ([place]) 06/04/2011    Past Surgical History:  Procedure Laterality Date  . BOWEL RESECTION  2005  . BREAST SURGERY  08/03/13   left mastectomy  . CARPAL TUNNEL RELEASE Right 30 years   . MASTECTOMY  left   . PARTIAL HYSTERECTOMY    . TONSILLECTOMY    . UMBILICAL HERNIA REPAIR      Prior to Admission medications   Medication Sig Start Date End Date Taking? Authorizing Provider  albuterol (PROVENTIL HFA;VENTOLIN HFA) 108 (90 Base) MCG/ACT inhaler Inhale 2 puffs into the lungs every 6 (six) hours as needed for wheezing or shortness of breath. 02/09/17   Johnson, Megan P, DO  alendronate (FOSAMAX) 70 MG tablet Take 70 mg by mouth once a week. Take with a full glass of water on an empty stomach.    [provider]   amLODipine-olmesartan (AZOR) 5-40 MG tablet Take 1 tablet by mouth daily.    [provider]  anastrozole (ARIMIDEX) 1 MG tablet TAKE 1 TABLET BY MOUTH ONCE DAILY 06/16/17   Byrnett, Forest Gleason, MD  benzonatate (TESSALON) 100 MG capsule Take by mouth 3 (three) times daily as needed for cough.    [provider]  blood glucose meter kit and supplies Diagnosis:E11.9 Test glucose twice daily 11/21/14   Wynetta Emery, Megan P, DO  brimonidine (ALPHAGAN P) 0.1 % SOLN 2 (two) times daily.     [provider]  cyanocobalamin 1000 MCG tablet Take 1,000 mcg by mouth daily.    [provider]  digoxin (LANOXIN) 0.125 MG tablet Take by mouth daily.    [provider]  dorzolamide (TRUSOPT) 2 % ophthalmic solution 1 drop 2 (two) times daily.     [provider]  famotidine (PEPCID) 20 MG tablet Take 20 mg by mouth 2 (two) times daily.    [provider]  feeding supplement, ENSURE ENLIVE, (ENSURE ENLIVE) LIQD Take 237 mLs by mouth 2 (two) times daily between meals. 01/31/17   Loletha Grayer, MD  fluticasone (FLONASE) 50 MCG/ACT nasal spray Place 2 sprays into both nostrils daily. 10/14/15   Volney American, PA-C  Fluticasone-Salmeterol (ADVAIR) 250-50 MCG/DOSE AEPB Inhale 1 puff into the lungs every 12 (twelve) hours. 08/18/16   [name], MD  glucose blood test strip Use as instructed 11/05/14   [name], MD  latanoprost (XALATAN) 0.005 % ophthalmic solution Place 1 drop into both eyes at bedtime.    [provider]  LEVEMIR FLEXTOUCH 100 UNIT/ML Pen Inject 6 Units into the skin daily at 10 pm. INJECT 6 UNITS PER DAY 01/31/17   Wieting, Richard, MD  levothyroxine (SYNTHROID, LEVOTHROID) 100 MCG tablet Take 1 tablet (100 mcg total) by mouth daily. 11/19/16   [name], MD  magnesium oxide (MAG-OX) 400 MG tablet Take 1 tablet (400 mg total) by mouth 2 (two) times daily. 04/07/17   Theora Gianotti, NP  meclizine  (ANTIVERT) 25 MG tablet Take 1 tablet (25 mg total) 3 (three) times daily as needed by mouth. 03/05/17   [name], MD  metoprolol succinate (TOPROL-XL) 100 MG 24 hr tablet Take 1 tablet (100 mg total) by mouth daily. 08/18/16   [name], MD  mometasone (ELOCON) 0.1 % cream Apply 1 application topically daily. Apply 1-2 times daily for no longer than 2 weeks at a time 11/24/16   Volney American, PA-C  mometasone-formoterol [facility]) 200-5 MCG/ACT AERO Inhale 2 puffs into the lungs 2 (two) times daily. 02/09/17   Johnson, Megan P, DO  mupirocin ointment (BACTROBAN) 2 % Apply 1 application topically 2 (two) times daily. Do not use day in and day out 11/19/16   [name], MD  Olopatadine HCl (PATADAY) 0.2 %  SOLN Apply 1 drop to eye daily.     [provider]  pantoprazole (PROTONIX) 40 MG tablet Take 1 tablet (40 mg total) by mouth daily. 06/15/17 06/15/18  Minna Merritts, MD  potassium chloride SA (K-DUR,KLOR-CON) 20 MEQ tablet Take 1 tablet (20 mEq total) by mouth 2 (two) times daily. 06/15/17   Minna Merritts, MD  solifenacin (VESICARE) 10 MG tablet Take 1 tablet (10 mg total) daily by mouth. 03/05/17   [name], MD  torsemide (DEMADEX) 20 MG tablet Take 2 tablets (40 mg total) by mouth 2 (two) times daily. 06/15/17 09/13/17  Minna Merritts, MD    Allergies Propoxyphene; Advil [ibuprofen]; Darvocet [propoxyphene n-acetaminophen]; and Percocet [oxycodone-acetaminophen]  Family History  Problem Relation Age of Onset  . Cervical cancer Mother   . Diabetes Mother   . Glaucoma Mother   . Cervical cancer Daughter   . Hypertension Daughter   . Hypertension Father   . Hypertension Sister   . Hypertension Brother   . Hypertension Son   . Diabetes Brother   . HIV Brother   . Breast cancer Neg Hx     Social History Social History   Tobacco Use  . Smoking status: Former Smoker    Last attempt to quit: 04/20/1974    Years since quitting: 43.2  .  Smokeless tobacco: Never Used  . Tobacco comment: Quit 1976  Substance Use Topics  . Alcohol use: No  . Drug use: No    Review of Systems Constitutional: Negative for fever. Cardiovascular: Negative for chest pain. Respiratory: Negative for shortness of breath. Musculoskeletal: Positive for right shoulder and right knee pain. Skin: Positive for abrasion to the right knee.  Neurological: Negative for decrease in sensation  ____________________________________________   PHYSICAL EXAM:  VITAL SIGNS: ED Triage Vitals  Enc Vitals Group     BP 06/30/17 0533 116/75     Pulse Rate 06/30/17 0533 89     Resp 06/30/17 0533 18     Temp 06/30/17 0533 (!) 97.4 F (36.3 C)     Temp Source 06/30/17 0533 Axillary     SpO2 06/30/17 0533 98 %     Weight 06/30/17 0534 181 lb (82.1 kg)     Height 06/30/17 0534 5' (1.524 m)     Head Circumference --      Peak Flow --      Pain Score 06/30/17 0535 8     Pain Loc --      Pain Edu? --      Excl. in [place]? --     Constitutional: Alert and oriented. Well appearing and in no acute distress. Eyes: Conjunctivae are clear without discharge or drainage Head: Atraumatic Neck: Nexus criteria is negative Respiratory: No cough. Respirations are even and unlabored. Musculoskeletal: Pain in the right superior shoulder with abduction. Tender over the deltoid with palpation. Full extension of the right elbow demonstrated. Active ROM of the right wrist without pain or deformity. Right knee tender to palpation over the patella. Flexion and extension is demonstrated without complaint of pain. Neurologic: Awake, alert, and oriented.  Skin: Abrasion over the right patella.  Psychiatric: Affect and behavior are appropriate.  ____________________________________________   LABS (all labs ordered are listed, but only abnormal results are displayed)  Labs Reviewed - No data to display ____________________________________________  RADIOLOGY  Images of both the  right shoulder and right knee are negative for acute bony abnormality per radiology. ____________________________________________   PROCEDURES  Procedures  ____________________________________________   INITIAL IMPRESSION / ASSESSMENT AND PLAN / ED COURSE  CHANNEL PAPANDREA is a 83 y.o. who presents to the emergency department for evaluation after falling this morning. X-rays are not concerning for acute fracture. Wound over the right patella was cleaned and bacitracin applied. Tylenol was given for pain. She was advised to follow up with her PCP if shoulder or knee pain does not go away after a few days. She was advised to return to the ER for symptoms that change or worsen if unable to schedule an appointment.  Medications  bacitracin ointment 1 application (1 application Topical Given 06/30/17 0743)  acetaminophen (TYLENOL) tablet 650 mg (650 mg Oral Given 06/30/17 0743)    Pertinent labs & imaging results that were available during my care of the patient were reviewed by me and considered in my medical decision making (see chart for details).  _________________________________________   FINAL CLINICAL IMPRESSION(S) / ED DIAGNOSES  Final diagnoses:  Fall, initial encounter  Abrasion  Acute pain of right shoulder  Acute pain of right knee    ED Discharge Orders    None       If controlled substance prescribed during this visit, 12 month history viewed on the [place] prior to issuing an initial prescription for Schedule II or III opiod.    Victorino Dike, FNP 06/30/17 [id number]    Lisa Roca, MD 06/30/17 1204

## 2017-06-30 NOTE — ED Triage Notes (Addendum)
Pt to triage via w/c with no distress noted, brought in by family; pt reports that she tripped and fell injuring right shoulder; also c/o pain to right knee(small abrasion noted) and right great toe; pt denies hitting head, denies cervical or spinal tenderness with palpation

## 2017-07-01 DIAGNOSIS — I951 Orthostatic hypotension: Secondary | ICD-10-CM | POA: Diagnosis not present

## 2017-07-01 DIAGNOSIS — C50412 Malignant neoplasm of upper-outer quadrant of left female breast: Secondary | ICD-10-CM | POA: Diagnosis not present

## 2017-07-01 DIAGNOSIS — D510 Vitamin B12 deficiency anemia due to intrinsic factor deficiency: Secondary | ICD-10-CM | POA: Diagnosis not present

## 2017-07-01 DIAGNOSIS — K265 Chronic or unspecified duodenal ulcer with perforation: Secondary | ICD-10-CM | POA: Diagnosis not present

## 2017-07-01 DIAGNOSIS — J449 Chronic obstructive pulmonary disease, unspecified: Secondary | ICD-10-CM | POA: Diagnosis not present

## 2017-07-01 DIAGNOSIS — I5033 Acute on chronic diastolic (congestive) heart failure: Secondary | ICD-10-CM | POA: Diagnosis not present

## 2017-07-01 DIAGNOSIS — I4891 Unspecified atrial fibrillation: Secondary | ICD-10-CM | POA: Diagnosis not present

## 2017-07-01 DIAGNOSIS — I214 Non-ST elevation (NSTEMI) myocardial infarction: Secondary | ICD-10-CM | POA: Diagnosis not present

## 2017-07-01 DIAGNOSIS — K551 Chronic vascular disorders of intestine: Secondary | ICD-10-CM | POA: Diagnosis not present

## 2017-07-01 DIAGNOSIS — E119 Type 2 diabetes mellitus without complications: Secondary | ICD-10-CM | POA: Diagnosis not present

## 2017-07-02 DIAGNOSIS — I951 Orthostatic hypotension: Secondary | ICD-10-CM | POA: Diagnosis not present

## 2017-07-02 DIAGNOSIS — K265 Chronic or unspecified duodenal ulcer with perforation: Secondary | ICD-10-CM | POA: Diagnosis not present

## 2017-07-02 DIAGNOSIS — D510 Vitamin B12 deficiency anemia due to intrinsic factor deficiency: Secondary | ICD-10-CM | POA: Diagnosis not present

## 2017-07-02 DIAGNOSIS — I4891 Unspecified atrial fibrillation: Secondary | ICD-10-CM | POA: Diagnosis not present

## 2017-07-02 DIAGNOSIS — J449 Chronic obstructive pulmonary disease, unspecified: Secondary | ICD-10-CM | POA: Diagnosis not present

## 2017-07-02 DIAGNOSIS — C50412 Malignant neoplasm of upper-outer quadrant of left female breast: Secondary | ICD-10-CM | POA: Diagnosis not present

## 2017-07-02 DIAGNOSIS — I5033 Acute on chronic diastolic (congestive) heart failure: Secondary | ICD-10-CM | POA: Diagnosis not present

## 2017-07-02 DIAGNOSIS — I214 Non-ST elevation (NSTEMI) myocardial infarction: Secondary | ICD-10-CM | POA: Diagnosis not present

## 2017-07-02 DIAGNOSIS — K551 Chronic vascular disorders of intestine: Secondary | ICD-10-CM | POA: Diagnosis not present

## 2017-07-02 DIAGNOSIS — E119 Type 2 diabetes mellitus without complications: Secondary | ICD-10-CM | POA: Diagnosis not present

## 2017-07-02 NOTE — Telephone Encounter (Signed)
Several things could be going on Recent urinary tract infection 1 month ago , we need to double check it is gone Low potassium 1 month ago, need to see if this has improved Would recommend lab work to start urinalysis, urine culture, BNP, basic metabolic panel  Reaching and confusion could be from urine infection

## 2017-07-02 NOTE — Telephone Encounter (Signed)
Spoke with patients son per release form and reviewed Dr. Donivan Scull recommendations to check with her primary care provider due to recent UTI and for possible labs. He verbalized understanding and stated that he would reach out to them for appointment. Instructed him to call back if he should have any further questions. He verbalized understanding of our conversation, agreement with plan, and had no further questions at this time.

## 2017-07-02 NOTE — Telephone Encounter (Addendum)
Patients daughter called to see if we had update from Dr. Rockey Situ regarding patients shortness of breath, dizziness, leg swelling, and increased weight. Reviewed that based on our weights she went from 188 down to 181. Instructed her on ways to monitor her daily weights for increase in 2 pounds in one day or 5 pounds in one week. Also discussed fluid intake, sodium restriction, compression hose, and leg elevation. Reviewed that message is pending review by physician and we also have her scheduled to come in on 07/13/17 at 3:40 PM. She verbalized understanding of our conversation, agreement with plan, and had no further questions at this time.

## 2017-07-05 ENCOUNTER — Ambulatory Visit: Payer: Medicare Other | Admitting: Cardiovascular Disease

## 2017-07-05 DIAGNOSIS — K265 Chronic or unspecified duodenal ulcer with perforation: Secondary | ICD-10-CM | POA: Diagnosis not present

## 2017-07-05 DIAGNOSIS — D510 Vitamin B12 deficiency anemia due to intrinsic factor deficiency: Secondary | ICD-10-CM | POA: Diagnosis not present

## 2017-07-05 DIAGNOSIS — I951 Orthostatic hypotension: Secondary | ICD-10-CM | POA: Diagnosis not present

## 2017-07-05 DIAGNOSIS — K551 Chronic vascular disorders of intestine: Secondary | ICD-10-CM | POA: Diagnosis not present

## 2017-07-05 DIAGNOSIS — I4891 Unspecified atrial fibrillation: Secondary | ICD-10-CM | POA: Diagnosis not present

## 2017-07-05 DIAGNOSIS — I214 Non-ST elevation (NSTEMI) myocardial infarction: Secondary | ICD-10-CM | POA: Diagnosis not present

## 2017-07-05 DIAGNOSIS — I5033 Acute on chronic diastolic (congestive) heart failure: Secondary | ICD-10-CM | POA: Diagnosis not present

## 2017-07-05 DIAGNOSIS — J449 Chronic obstructive pulmonary disease, unspecified: Secondary | ICD-10-CM | POA: Diagnosis not present

## 2017-07-05 DIAGNOSIS — E119 Type 2 diabetes mellitus without complications: Secondary | ICD-10-CM | POA: Diagnosis not present

## 2017-07-05 DIAGNOSIS — C50412 Malignant neoplasm of upper-outer quadrant of left female breast: Secondary | ICD-10-CM | POA: Diagnosis not present

## 2017-07-06 ENCOUNTER — Ambulatory Visit: Payer: Medicare Other | Attending: Family | Admitting: Family

## 2017-07-06 ENCOUNTER — Encounter: Payer: Self-pay | Admitting: Family

## 2017-07-06 VITALS — BP 100/61 | HR 115 | Resp 18 | Ht 63.0 in | Wt 171.4 lb

## 2017-07-06 DIAGNOSIS — J449 Chronic obstructive pulmonary disease, unspecified: Secondary | ICD-10-CM | POA: Diagnosis not present

## 2017-07-06 DIAGNOSIS — R000 Tachycardia, unspecified: Secondary | ICD-10-CM | POA: Diagnosis not present

## 2017-07-06 DIAGNOSIS — Z7951 Long term (current) use of inhaled steroids: Secondary | ICD-10-CM | POA: Insufficient documentation

## 2017-07-06 DIAGNOSIS — I5042 Chronic combined systolic (congestive) and diastolic (congestive) heart failure: Secondary | ICD-10-CM | POA: Diagnosis not present

## 2017-07-06 DIAGNOSIS — I9589 Other hypotension: Secondary | ICD-10-CM | POA: Diagnosis not present

## 2017-07-06 DIAGNOSIS — I890 Lymphedema, not elsewhere classified: Secondary | ICD-10-CM | POA: Diagnosis not present

## 2017-07-06 DIAGNOSIS — H409 Unspecified glaucoma: Secondary | ICD-10-CM | POA: Diagnosis not present

## 2017-07-06 DIAGNOSIS — Z885 Allergy status to narcotic agent status: Secondary | ICD-10-CM | POA: Diagnosis not present

## 2017-07-06 DIAGNOSIS — E669 Obesity, unspecified: Secondary | ICD-10-CM | POA: Diagnosis not present

## 2017-07-06 DIAGNOSIS — E1151 Type 2 diabetes mellitus with diabetic peripheral angiopathy without gangrene: Secondary | ICD-10-CM | POA: Diagnosis not present

## 2017-07-06 DIAGNOSIS — I10 Essential (primary) hypertension: Secondary | ICD-10-CM

## 2017-07-06 DIAGNOSIS — Z853 Personal history of malignant neoplasm of breast: Secondary | ICD-10-CM | POA: Diagnosis not present

## 2017-07-06 DIAGNOSIS — E785 Hyperlipidemia, unspecified: Secondary | ICD-10-CM | POA: Insufficient documentation

## 2017-07-06 DIAGNOSIS — G5600 Carpal tunnel syndrome, unspecified upper limb: Secondary | ICD-10-CM | POA: Insufficient documentation

## 2017-07-06 DIAGNOSIS — I5032 Chronic diastolic (congestive) heart failure: Secondary | ICD-10-CM

## 2017-07-06 DIAGNOSIS — I480 Paroxysmal atrial fibrillation: Secondary | ICD-10-CM

## 2017-07-06 DIAGNOSIS — I110 Hypertensive heart disease with heart failure: Secondary | ICD-10-CM | POA: Diagnosis not present

## 2017-07-06 DIAGNOSIS — E861 Hypovolemia: Secondary | ICD-10-CM | POA: Diagnosis not present

## 2017-07-06 DIAGNOSIS — Z794 Long term (current) use of insulin: Secondary | ICD-10-CM | POA: Diagnosis not present

## 2017-07-06 DIAGNOSIS — E039 Hypothyroidism, unspecified: Secondary | ICD-10-CM | POA: Diagnosis not present

## 2017-07-06 DIAGNOSIS — Z87891 Personal history of nicotine dependence: Secondary | ICD-10-CM | POA: Diagnosis not present

## 2017-07-06 LAB — BASIC METABOLIC PANEL
Anion gap: 13 (ref 5–15)
BUN: 38 mg/dL — AB (ref 6–20)
CHLORIDE: 97 mmol/L — AB (ref 101–111)
CO2: 30 mmol/L (ref 22–32)
CREATININE: 1.33 mg/dL — AB (ref 0.44–1.00)
Calcium: 9.4 mg/dL (ref 8.9–10.3)
GFR calc Af Amer: 43 mL/min — ABNORMAL LOW (ref >60)
GFR, EST NON AFRICAN AMERICAN: 37 mL/min — AB (ref >60)
Glucose, Bld: 105 mg/dL — ABNORMAL HIGH (ref 65–99)
Potassium: 4.3 mmol/L (ref 3.5–5.1)
Sodium: 140 mmol/L (ref 135–145)

## 2017-07-06 NOTE — Progress Notes (Signed)
Patient ID: Mia Padilla, female    DOB: December 04, 1934, 83 y.o.   MRN: [id number]  HPI  Ms Riera is an 83 y/o female with a history of breast cancer, COPD, diabetes, glaucoma, HTN, hyperlipidemia, DVT, atrial fibrillation, remote tobacco use and chronic heart failure.   Echo report from 01/24/17 reviewed and shows an EF of 40% along with mild MR and a PA pressure of 50-55 mm Hg.  Was in the ED 06/30/17 due to a mechanical fall. Right knee and right shoulder xrays were negative and she was released. Was in the ED 06/02/17 due to HF exacerbation. Diuretic dose adjusted and she was released. Was in the ED 02/22/17 due to hypotension due to hypovolemia. IV fluids were given and she was released home. Admitted 01/24/17 due to perforated duodenal ulcer. IV protonix was given with transition to oral protonix. Surgical and cardiology consults were obtained. Elevated troponin thought to be due to demand ischemia. Discharged home after 7 days.    She presents today for a follow-up visit with a chief complaint of moderate fatigue upon minimal exertion. She says this has been chronic in nature having been present for several years. She has associated shortness of breath, dizziness, edema and difficulty sleeping along with this. She denies any abdominal distention, palpitations, chest pain, cough or weight gain. She says that she's fallen twice recently. Currently has a nurse from [facility] coming  Past Medical History:  Diagnosis Date  . Arthritis    hands  . Black head   . Bowel incontinence   . Breast cancer ([place]) 11/15/2013  . Breast cancer of upper-outer quadrant of left female breast ([place]) 2015   Left  . Carotid artery stenosis    a. ultrasound 10/8673: 4-49 LICA stenosis, 20-10% RICA stenosis; b. followed by vascular  . Carotid stenosis 11/17/2016  . Carpal tunnel syndrome   . Chronic combined systolic and diastolic CHF, NYHA class 3 ([place])    a. TTE 10/18: EF 40%, diffuse HK, DD, calcifief  mitral annulus with mild MR, mildly dilated left atrium, normal RV cavity size and systolic function, PASP 07-12 mmHg  . COPD (chronic obstructive pulmonary disease) ([place])   . Debilitated   . Diabetes mellitus with complication ([place])   . Glaucoma   . History of seizures   . History of stress test    a. 11/18: small defect of mild severity present in the apex location felt to be 2/2 breast attenuation. Normal study  . Hyperlipidemia   . Hypertension   . Hypomagnesemia   . Hypotension   . Hypothyroidism 02/07/2015  . Mesenteric artery stenosis ([place])   . Obese 06/04/2011  . Obesity   . PAF (paroxysmal atrial fibrillation) (HCC)    a. on Eliquis; b. CHADS2VASc 8 (CHF, HTN, age x 2, DM, vascular disease, sex category)  . Perforated duodenal ulcer ([place]) 01/2017   a. conservatively managed  . Peripheral vascular disease ([place]) 04/23/2015  . SMA stenosis ([place])    a. noted 10/18; b. conservatively managed; c. unclear if thrombus vs plaque; d. on Eliquis given PAF and Lipitor  . Urinary incontinence    Past Surgical History:  Procedure Laterality Date  . BOWEL RESECTION  2005  . BREAST SURGERY  08/03/13   left mastectomy  . CARPAL TUNNEL RELEASE Right 30 years   . MASTECTOMY     left   . PARTIAL HYSTERECTOMY    . TONSILLECTOMY    . UMBILICAL HERNIA REPAIR  Family History  Problem Relation Age of Onset  . Cervical cancer Mother   . Diabetes Mother   . Glaucoma Mother   . Cervical cancer Daughter   . Hypertension Daughter   . Hypertension Father   . Hypertension Sister   . Hypertension Brother   . Hypertension Son   . Diabetes Brother   . HIV Brother   . Breast cancer Neg Hx    Social History   Tobacco Use  . Smoking status: Former Smoker    Last attempt to quit: 04/20/1974    Years since quitting: 43.2  . Smokeless tobacco: Never Used  . Tobacco comment: Quit 1976  Substance Use Topics  . Alcohol use: No   Allergies  Allergen Reactions  . Propoxyphene Other (See  Comments)    Other Reaction: Severe Headache  . Advil [Ibuprofen] Other (See Comments)    Makes her heart race  . Darvocet [Propoxyphene N-Acetaminophen]   . Percocet [Oxycodone-Acetaminophen]     hallucination   Prior to Admission medications   Medication Sig Start Date End Date Taking? Authorizing Provider  albuterol (PROVENTIL HFA;VENTOLIN HFA) 108 (90 Base) MCG/ACT inhaler Inhale 2 puffs into the lungs every 6 (six) hours as needed for wheezing or shortness of breath. 02/09/17  Yes Johnson, Megan P, DO  alendronate (FOSAMAX) 70 MG tablet Take 70 mg by mouth once a week. Take with a full glass of water on an empty stomach.   Yes [provider]  amLODipine-olmesartan (AZOR) 5-40 MG tablet Take 1 tablet by mouth daily.   Yes [provider]  anastrozole (ARIMIDEX) 1 MG tablet TAKE 1 TABLET BY MOUTH ONCE DAILY 06/16/17  Yes Byrnett, Forest Gleason, MD  benzonatate (TESSALON) 100 MG capsule Take by mouth 3 (three) times daily as needed for cough.   Yes [provider]  blood glucose meter kit and supplies Diagnosis:E11.9 Test glucose twice daily 11/21/14  Yes Johnson, Megan P, DO  brimonidine (ALPHAGAN P) 0.1 % SOLN 2 (two) times daily.    Yes [provider]  cyanocobalamin 1000 MCG tablet Take 1,000 mcg by mouth daily.   Yes [provider]  digoxin (LANOXIN) 0.125 MG tablet Take by mouth daily.   Yes [provider]  dorzolamide (TRUSOPT) 2 % ophthalmic solution 1 drop 2 (two) times daily.    Yes [provider]  famotidine (PEPCID) 20 MG tablet Take 20 mg by mouth 2 (two) times daily.   Yes [provider]  feeding supplement, ENSURE ENLIVE, (ENSURE ENLIVE) LIQD Take 237 mLs by mouth 2 (two) times daily between meals. 01/31/17  Yes Wieting, Richard, MD  fluticasone (FLONASE) 50 MCG/ACT nasal spray Place 2 sprays into both nostrils daily. 10/14/15  Yes Volney American, PA-C  Fluticasone-Salmeterol (ADVAIR) 250-50 MCG/DOSE  AEPB Inhale 1 puff into the lungs every 12 (twelve) hours. 08/18/16  Yes [name], MD  glucose blood test strip Use as instructed 11/05/14  Yes Crissman, Jeannette How, MD  latanoprost (XALATAN) 0.005 % ophthalmic solution Place 1 drop into both eyes at bedtime.   Yes [provider]  LEVEMIR FLEXTOUCH 100 UNIT/ML Pen Inject 6 Units into the skin daily at 10 pm. INJECT 6 UNITS PER DAY 01/31/17  Yes Wieting, Richard, MD  levothyroxine (SYNTHROID, LEVOTHROID) 100 MCG tablet Take 1 tablet (100 mcg total) by mouth daily. 11/19/16  Yes Crissman, Jeannette How, MD  magnesium oxide (MAG-OX) 400 MG tablet Take 1 tablet (400 mg total) by mouth 2 (two) times  daily. 04/07/17  Yes Theora Gianotti, NP  meclizine (ANTIVERT) 25 MG tablet Take 1 tablet (25 mg total) 3 (three) times daily as needed by mouth. 03/05/17  Yes Crissman, Jeannette How, MD  metoprolol succinate (TOPROL-XL) 100 MG 24 hr tablet Take 1 tablet (100 mg total) by mouth daily. 08/18/16  Yes Crissman, Jeannette How, MD  mometasone (ELOCON) 0.1 % cream Apply 1 application topically daily. Apply 1-2 times daily for no longer than 2 weeks at a time 11/24/16  Yes Orene Desanctis, Lilia Argue, PA-C  mometasone-formoterol [facility]) 200-5 MCG/ACT AERO Inhale 2 puffs into the lungs 2 (two) times daily. 02/09/17  Yes Johnson, Megan P, DO  mupirocin ointment (BACTROBAN) 2 % Apply 1 application topically 2 (two) times daily. Do not use day in and day out 11/19/16  Yes Crissman, Jeannette How, MD  Olopatadine HCl (PATADAY) 0.2 % SOLN Apply 1 drop to eye daily.    Yes [provider]  pantoprazole (PROTONIX) 40 MG tablet Take 1 tablet (40 mg total) by mouth daily. 06/15/17 06/15/18 Yes Gollan, Kathlene November, MD  potassium chloride SA (K-DUR,KLOR-CON) 20 MEQ tablet Take 1 tablet (20 mEq total) by mouth 2 (two) times daily. 06/15/17  Yes Minna Merritts, MD  solifenacin (VESICARE) 10 MG tablet Take 1 tablet (10 mg total) daily by mouth. 03/05/17  Yes Crissman, Jeannette How, MD  torsemide  (DEMADEX) 20 MG tablet Take 2 tablets (40 mg total) by mouth 2 (two) times daily. 06/15/17 09/13/17 Yes Minna Merritts, MD   Review of Systems  Constitutional: Positive for fatigue. Negative for appetite change.  HENT: Positive for rhinorrhea. Negative for congestion and sore throat.   Eyes: Positive for visual disturbance (blurry vision).  Respiratory: Positive for shortness of breath. Negative for cough and chest tightness.   Cardiovascular: Positive for leg swelling. Negative for chest pain and palpitations.  Gastrointestinal: Negative for abdominal distention and abdominal pain.  Endocrine: Negative.   Genitourinary: Negative.   Musculoskeletal: Positive for arthralgias (right shoulder and right knee pain). Negative for back pain.  Skin: Negative.   Allergic/Immunologic: Negative.   Neurological: Positive for dizziness and light-headedness (at times).  Hematological: Negative for adenopathy. Does not bruise/bleed easily.  Psychiatric/Behavioral: Positive for sleep disturbance (sleeping more during the day). Negative for dysphoric mood. The patient is not nervous/anxious.    Vitals:   07/06/17 1330  BP: 100/61  Pulse: (!) 115  Resp: 18  SpO2: 100%  Weight: 171 lb 6 oz (77.7 kg)  Height: '5\' 3"'$  (1.6 m)   Wt Readings from Last 3 Encounters:  07/06/17 171 lb 6 oz (77.7 kg)  06/30/17 181 lb (82.1 kg)  06/29/17 181 lb (82.1 kg)   Lab Results  Component Value Date   CREATININE 1.33 (H) 07/06/2017   CREATININE 0.94 06/02/2017   CREATININE 0.89 05/04/2017    Physical Exam  Constitutional: She is oriented to person, place, and time. She appears well-developed and well-nourished.  HENT:  Head: Normocephalic and atraumatic.  Neck: Normal range of motion. Neck supple. No JVD present.  Cardiovascular: An irregular rhythm present. Tachycardia present.  Pulmonary/Chest: Effort normal. She has no wheezes. She has no rales.  Abdominal: Soft. She exhibits no distension. There is no  tenderness.  Musculoskeletal: She exhibits edema (1+ pitting edema in bilateral lower legs). She exhibits no tenderness.  Neurological: She is alert and oriented to person, place, and time.  Skin: Skin is warm and dry.  Psychiatric: She has a normal mood and affect. Her  behavior is normal. Thought content normal.  Nursing note and vitals reviewed.  Assessment & Plan:  1: Chronic heart failure with preserved ejection fraction- - NYHA class III - mildly fluid overloaded today with pedal edema - weighing daily and she was reminded to call for an overnight weight gain of >2 pounds or a weekly weight gain of >5 pounds - weight down 10 pounds since 06/29/17 - diuretic has been changed to torsemide and she feels like her edema is improving - not adding salt and says that she lives with her son/daughter-in-law and they don't cook with salt. Reviewed the importance of reading food labels so that she can keep daily sodium intake to '2000mg'$ .  - saw cardiology Rockey Situ) 06/15/17 - patient reports receiving her flu vaccine for the season already - BNP 06/02/17 was 870.0  2: HTN- - BP on the low side today - granddaughter to speak with home health nurse about the need for PT for strength/balance training - last saw PCP (Crissman ) 05/04/17 - BMP from 06/02/17 reviewed and shows sodium 140, potassium 3.2 and GFR >60 - will recheck BMP today  3: Atrial fibrillaion- - tachycardic today; if this continues, could increase metoprolol - continues on apixaban & metoprolol  4: Lymphedema- - does elevate her legs - wearing support hose  - edema has improved since having diuretic changed to torsemide - limited in her ability to exercise due to vision changes and instability in walking without her walker - should edema continue, could consider lymphapress compression boots  Medication bottles were reviewed.  Patient opts to not make a return appointment at this time. Advised patient and her granddaughter  that they could call back at any time in the future to make another appointment.

## 2017-07-06 NOTE — Patient Instructions (Addendum)
Continue weighing daily and call for an overnight weight gain of > 2 pounds or a weekly weight gain of >5 pounds.  Drink between 50-60 ounces of fluid daily  Call us if you need any future appointments for weight gain, worsening edema or anything else

## 2017-07-07 DIAGNOSIS — J449 Chronic obstructive pulmonary disease, unspecified: Secondary | ICD-10-CM | POA: Diagnosis not present

## 2017-07-07 DIAGNOSIS — I214 Non-ST elevation (NSTEMI) myocardial infarction: Secondary | ICD-10-CM | POA: Diagnosis not present

## 2017-07-07 DIAGNOSIS — I5033 Acute on chronic diastolic (congestive) heart failure: Secondary | ICD-10-CM | POA: Diagnosis not present

## 2017-07-07 DIAGNOSIS — C50412 Malignant neoplasm of upper-outer quadrant of left female breast: Secondary | ICD-10-CM | POA: Diagnosis not present

## 2017-07-07 DIAGNOSIS — K551 Chronic vascular disorders of intestine: Secondary | ICD-10-CM | POA: Diagnosis not present

## 2017-07-07 DIAGNOSIS — K265 Chronic or unspecified duodenal ulcer with perforation: Secondary | ICD-10-CM | POA: Diagnosis not present

## 2017-07-07 DIAGNOSIS — I951 Orthostatic hypotension: Secondary | ICD-10-CM | POA: Diagnosis not present

## 2017-07-07 DIAGNOSIS — E119 Type 2 diabetes mellitus without complications: Secondary | ICD-10-CM | POA: Diagnosis not present

## 2017-07-07 DIAGNOSIS — I4891 Unspecified atrial fibrillation: Secondary | ICD-10-CM | POA: Diagnosis not present

## 2017-07-07 DIAGNOSIS — D510 Vitamin B12 deficiency anemia due to intrinsic factor deficiency: Secondary | ICD-10-CM | POA: Diagnosis not present

## 2017-07-09 ENCOUNTER — Telehealth: Payer: Self-pay | Admitting: Family Medicine

## 2017-07-09 DIAGNOSIS — I214 Non-ST elevation (NSTEMI) myocardial infarction: Secondary | ICD-10-CM | POA: Diagnosis not present

## 2017-07-09 DIAGNOSIS — I951 Orthostatic hypotension: Secondary | ICD-10-CM | POA: Diagnosis not present

## 2017-07-09 DIAGNOSIS — I4891 Unspecified atrial fibrillation: Secondary | ICD-10-CM | POA: Diagnosis not present

## 2017-07-09 DIAGNOSIS — E119 Type 2 diabetes mellitus without complications: Secondary | ICD-10-CM | POA: Diagnosis not present

## 2017-07-09 DIAGNOSIS — K551 Chronic vascular disorders of intestine: Secondary | ICD-10-CM | POA: Diagnosis not present

## 2017-07-09 DIAGNOSIS — J449 Chronic obstructive pulmonary disease, unspecified: Secondary | ICD-10-CM | POA: Diagnosis not present

## 2017-07-09 DIAGNOSIS — C50412 Malignant neoplasm of upper-outer quadrant of left female breast: Secondary | ICD-10-CM | POA: Diagnosis not present

## 2017-07-09 DIAGNOSIS — D510 Vitamin B12 deficiency anemia due to intrinsic factor deficiency: Secondary | ICD-10-CM | POA: Diagnosis not present

## 2017-07-09 DIAGNOSIS — I5033 Acute on chronic diastolic (congestive) heart failure: Secondary | ICD-10-CM | POA: Diagnosis not present

## 2017-07-09 DIAGNOSIS — K265 Chronic or unspecified duodenal ulcer with perforation: Secondary | ICD-10-CM | POA: Diagnosis not present

## 2017-07-09 NOTE — Telephone Encounter (Signed)
Copied from [place] [phone]. Topic: Quick Communication - See Telephone Encounter >> Jul 09, 2017 12:37 PM Synthia Innocent wrote: CRM for notification. See Telephone encounter for: 07/09/17. [facility] checking status of PT eval, state they faxed over paperwork. Please advise

## 2017-07-09 NOTE — Progress Notes (Signed)
Cardiology Office Note  Date:  07/13/2017   ID:  Mia Padilla, DOB January 15, 1935, MRN [id number]  PCP:  [name], MD   Chief Complaint  Patient presents with  . other    1 month follow up. Meds reviewed by the pt.'s med list. Pt. c/o shortness of breath, chest pain and LE edema.     HPI:  Mia Padilla is a 83 year old woman with history of  stroke,  seizure disorder,  hypertension,  COPD,  smoking for 20 years (1958 to 1978), on inhalers diabetes,  hypothyroidism,  obesity,  atrial fibrillation  hospital admission April 04 2011 for mental status changes.   echocardiogram showing normal LV function,  carotid ultrasound showing no significant disease,  head CT and MRI showing no significant stroke.   possible TIA.   started on warfarin for atrial fibrillation , Stopped since that time by primary care secondary to unsteady gait and risk of falls Lives with her son and his wife.  She presents today for follow up of her atrial fibrillation.   Family presents with her on today's visit Weight last visit was 195 pounds, severe leg swelling  weight today is 169 Compliant with her diuretic regimen, taking torsemide 40 twice daily Down 26 pounds Eating less, some difficulty getting meals Lives with family.  She seems to be reliant on what they are cooking or bringing into the house [place], severe knee pain on the right, went to the emergency room June 30, 2017  Family does report continued shortness of breath with exertion Not very active at baseline She does not use a pillbox, does not herself using the pill jars Some confusion concerning her medications, Any changes "write it down"  Leg edema has resolved  EKG personally reviewed by myself on todays visit Shows normal sinus rhythm rate 84 bpm old anterior MI, PVCs, nonspecific ST-T wave abnormality lateral leads I and aVL consider old inferior MI  Other past medical history reviewed Increasing SOB , Went to the ER  June 02, 2017 UTI E. coli Was doing lasix 40 BID Weight up 10 pounds? Discharged on Lasix 60 mg am and 40 mg pm High water intake   Urgent care around 1/15/ 2018: With symptoms of cough On 05/08/2016: She went to the emergency room for similar symptoms with Cough and SOB BNP 207, tnt 0.03  Occasional vertigo for which she takes meclizine when necessary  Significant gait instability, uses a walker significant osteoarthritis of her knees. H/o mastectomy 08/03/2013 for breast cancer by Dr. Jamal Collin. No radiation treatment or chemotherapy needed, she is on Arimidex.   Coumadin was stopped in the past secondary to unsteady gait by Dr. Jeananne Rama. She was started on Plavix 10/13/2011. She denies any recent falls.  Periods of tachycardia in the past, better on metoprolol Notes indicate that she has had a history of seizures, possibly with low glucose levels. Previous diagnosis of complex partial seizures and she was started on antiseizure medications. Weaned off Keppra   PMH:   has a past medical history of Arthritis, Black head, Bowel incontinence, Breast cancer ([place]) (11/15/2013), Breast cancer of upper-outer quadrant of left female breast ([place]) (2015), Carotid artery stenosis, Carotid stenosis (11/17/2016), Carpal tunnel syndrome, Chronic combined systolic and diastolic CHF, NYHA class 3 ([place]), COPD (chronic obstructive pulmonary disease) ([place]), Debilitated, Diabetes mellitus with complication ([place]), Glaucoma, History of seizures, History of stress test, Hyperlipidemia, Hypertension, Hypomagnesemia, Hypotension, Hypothyroidism (02/07/2015), Mesenteric artery stenosis ([place]), Obese (06/04/2011), Obesity, PAF (paroxysmal atrial fibrillation) (  [place]), Perforated duodenal ulcer ([place]) (01/2017), Peripheral vascular disease ([place]) (04/23/2015), SMA stenosis ([place]), and Urinary incontinence.  PSH:    Past Surgical History:  Procedure Laterality Date  . BOWEL RESECTION  2005  . BREAST SURGERY  08/03/13    left mastectomy  . CARPAL TUNNEL RELEASE Right 30 years   . MASTECTOMY     left   . PARTIAL HYSTERECTOMY    . TONSILLECTOMY    . UMBILICAL HERNIA REPAIR      Current Outpatient Medications  Medication Sig Dispense Refill  . albuterol (PROVENTIL HFA;VENTOLIN HFA) 108 (90 Base) MCG/ACT inhaler Inhale 2 puffs into the lungs every 6 (six) hours as needed for wheezing or shortness of breath. 1 Inhaler 0  . alendronate (FOSAMAX) 70 MG tablet Take 70 mg by mouth once a week. Take with a full glass of water on an empty stomach.    Marland Kitchen amLODipine-olmesartan (AZOR) 5-40 MG tablet Take 0.5 tablets by mouth daily. 45 tablet 3  . anastrozole (ARIMIDEX) 1 MG tablet TAKE 1 TABLET BY MOUTH ONCE DAILY 90 tablet 4  . benzonatate (TESSALON) 100 MG capsule Take by mouth 3 (three) times daily as needed for cough.    . blood glucose meter kit and supplies Diagnosis:E11.9 Test glucose twice daily 1 each 12  . brimonidine (ALPHAGAN P) 0.1 % SOLN 2 (two) times daily.     . cyanocobalamin 1000 MCG tablet Take 1,000 mcg by mouth daily.    . digoxin (LANOXIN) 0.125 MG tablet Take by mouth daily.    . dorzolamide (TRUSOPT) 2 % ophthalmic solution 1 drop 2 (two) times daily.     . famotidine (PEPCID) 20 MG tablet Take 20 mg by mouth 2 (two) times daily.    . feeding supplement, ENSURE ENLIVE, (ENSURE ENLIVE) LIQD Take 237 mLs by mouth 2 (two) times daily between meals. 60 Bottle 0  . fluticasone (FLONASE) 50 MCG/ACT nasal spray Place 2 sprays into both nostrils daily. 16 g 12  . Fluticasone-Salmeterol (ADVAIR) 250-50 MCG/DOSE AEPB Inhale 1 puff into the lungs every 12 (twelve) hours. 180 each 4  . glucose blood test strip Use as instructed 100 each 12  . latanoprost (XALATAN) 0.005 % ophthalmic solution Place 1 drop into both eyes at bedtime.    Marland Kitchen LEVEMIR FLEXTOUCH 100 UNIT/ML Pen Inject 6 Units into the skin daily at 10 pm. INJECT 6 UNITS PER DAY 15 mL 11  . levothyroxine (SYNTHROID, LEVOTHROID) 100 MCG tablet Take 1  tablet (100 mcg total) by mouth daily. 90 tablet 4  . magnesium oxide (MAG-OX) 400 MG tablet Take 1 tablet (400 mg total) by mouth 2 (two) times daily. 60 tablet 3  . meclizine (ANTIVERT) 25 MG tablet Take 1 tablet (25 mg total) 3 (three) times daily as needed by mouth. 90 tablet 1  . metoprolol succinate (TOPROL-XL) 100 MG 24 hr tablet Take 1 tablet (100 mg total) by mouth daily. 90 tablet 4  . mometasone (ELOCON) 0.1 % cream Apply 1 application topically daily. Apply 1-2 times daily for no longer than 2 weeks at a time 50 g 3  . mometasone-formoterol (DULERA) 200-5 MCG/ACT AERO Inhale 2 puffs into the lungs 2 (two) times daily. 13 g 12  . mupirocin ointment (BACTROBAN) 2 % Apply 1 application topically 2 (two) times daily. Do not use day in and day out 22 g 2  . Olopatadine HCl (PATADAY) 0.2 % SOLN Apply 1 drop to eye daily.     . pantoprazole (PROTONIX)  40 MG tablet Take 1 tablet (40 mg total) by mouth daily. 90 tablet 3  . potassium chloride SA (K-DUR,KLOR-CON) 20 MEQ tablet Take 1 tablet (20 mEq total) by mouth 2 (two) times daily. 30 tablet 6  . solifenacin (VESICARE) 10 MG tablet Take 1 tablet (10 mg total) daily by mouth. 90 tablet 2  . torsemide (DEMADEX) 20 MG tablet Take 2 tablets (40 mg total) by mouth 2 (two) times daily. 360 tablet 3   No current facility-administered medications for this visit.      Allergies:   Propoxyphene; Advil [ibuprofen]; Darvocet [propoxyphene n-acetaminophen]; and Percocet [oxycodone-acetaminophen]   Social History:  The patient  reports that she quit smoking about 43 years ago. She has never used smokeless tobacco. She reports that she does not drink alcohol or use drugs.   Family History:   family history includes Cervical cancer in her daughter and mother; Diabetes in her brother and mother; Glaucoma in her mother; HIV in her brother; Hypertension in her brother, daughter, father, sister, and son.    Review of Systems: Review of Systems   Constitutional: Negative.        Weight gain  Respiratory: Positive for shortness of breath.   Cardiovascular: Negative.   Gastrointestinal: Negative.   Musculoskeletal: Negative.   Neurological: Negative.   Psychiatric/Behavioral: Negative.   All other systems reviewed and are negative.     PHYSICAL EXAM: VS:  BP (!) 100/52 (BP Location: Right Arm, Patient Position: Sitting, Cuff Size: Normal)   Pulse 84   Ht _0  (1.626 m)   Wt 169 lb 4 oz (76.8 kg)   BMI 29.05 kg/m  , BMI Body mass index is 29.05 kg/m.  Constitutional:  oriented to person, place, and time. No distress. Presenting in a wheelchair HENT:  Head: Normocephalic and atraumatic.  Eyes:  no discharge. No scleral icterus.  Neck: Normal range of motion. Neck supple. No JVD present.  Cardiovascular: Normal rate, regular rhythm, normal heart sounds and intact distal pulses. Exam reveals no gallop and no friction rub.  1-2+ pitting edema to the thighs No murmur heard. Pulmonary/Chest: Effort normal and breath sounds normal. No stridor. No respiratory distress.  no wheezes.  no rales.  no tenderness.  Abdominal: Soft.  no distension.  no tenderness.  Musculoskeletal: Normal range of motion.  no  tenderness or deformity.  Neurological:  normal muscle tone. Coordination normal. No atrophy Skin: Skin is warm and dry. No rash noted. not diaphoretic.  Psychiatric:  normal mood and affect. behavior is normal. Thought content normal.    Recent Labs: 01/24/2017: TSH 3.197 04/06/2017: Magnesium 1.3 06/02/2017: ALT 10; B Natriuretic Peptide 870.0; Hemoglobin 11.3; Platelets 170 07/06/2017: BUN 38; Creatinine, Ser 1.33; Potassium 4.3; Sodium 140    Lipid Panel Lab Results  Component Value Date   CHOL 96 05/04/2017   HDL 37 (L) 02/09/2017   LDLCALC 38 02/09/2017   TRIG 66 05/04/2017      Wt Readings from Last 3 Encounters:  07/13/17 169 lb 4 oz (76.8 kg)  07/06/17 171 lb 6 oz (77.7 kg)  06/30/17 181 lb (82.1 kg)        ASSESSMENT AND PLAN:   Paroxysmal atrial fibrillation (HCC) - Plan: EKG 12-Lead Denies any palpitations or tachycardia, maintaining normal sinus rhythm on EKG Not on anticoagulation, high fall risk, stopped by primary care She has had a recent fall onto her knee,  Seen in the ER, stable  Essential hypertension Recommended she hold her diuretics  on Saturday Sunday for prerenal state Will also cut the amlodipine olmesartan in half given low blood pressure and dizziness  Centrilobular emphysema (HCC) Previous COPD exacerbation Stable, no recurrence of symptoms Family does report chronic shortness of breath on exertion She may benefit from pulmonary rehab  Type 2 diabetes mellitus with diabetic peripheral angiopathy without gangrene, without long-term current use of insulin ([place]) Unable to exercise. Recommended low carbohydrate diet Fluid weight off down 26 pounds, still shortness of breath with exertion  Chronic diastolic congestive heart failure (HCC) 26 pound weight loss on the torsemide Now prerenal Recommend she hold torsemide and potassium on weekends, liberalize her fluid intake to some degree.  She has been very strict with her fluids  Goal weight 170 up to 173 pounds   Total encounter time more than 45 minutes  Greater than 50% was spent in counseling and coordination of care with the patient   Disposition:   F/U  6 month   Orders Placed This Encounter  Procedures  . EKG 12-Lead     Signed, Esmond Plants, M.D., Ph.D. 07/13/2017  [place], [place]

## 2017-07-12 DIAGNOSIS — C50412 Malignant neoplasm of upper-outer quadrant of left female breast: Secondary | ICD-10-CM | POA: Diagnosis not present

## 2017-07-12 DIAGNOSIS — I951 Orthostatic hypotension: Secondary | ICD-10-CM | POA: Diagnosis not present

## 2017-07-12 DIAGNOSIS — J449 Chronic obstructive pulmonary disease, unspecified: Secondary | ICD-10-CM | POA: Diagnosis not present

## 2017-07-12 DIAGNOSIS — I214 Non-ST elevation (NSTEMI) myocardial infarction: Secondary | ICD-10-CM | POA: Diagnosis not present

## 2017-07-12 DIAGNOSIS — K551 Chronic vascular disorders of intestine: Secondary | ICD-10-CM | POA: Diagnosis not present

## 2017-07-12 DIAGNOSIS — I4891 Unspecified atrial fibrillation: Secondary | ICD-10-CM | POA: Diagnosis not present

## 2017-07-12 DIAGNOSIS — E119 Type 2 diabetes mellitus without complications: Secondary | ICD-10-CM | POA: Diagnosis not present

## 2017-07-12 DIAGNOSIS — K265 Chronic or unspecified duodenal ulcer with perforation: Secondary | ICD-10-CM | POA: Diagnosis not present

## 2017-07-12 DIAGNOSIS — D510 Vitamin B12 deficiency anemia due to intrinsic factor deficiency: Secondary | ICD-10-CM | POA: Diagnosis not present

## 2017-07-12 DIAGNOSIS — I5033 Acute on chronic diastolic (congestive) heart failure: Secondary | ICD-10-CM | POA: Diagnosis not present

## 2017-07-13 ENCOUNTER — Ambulatory Visit: Payer: Medicare Other | Admitting: Cardiovascular Disease

## 2017-07-13 ENCOUNTER — Encounter: Payer: Self-pay | Admitting: Cardiovascular Disease

## 2017-07-13 VITALS — BP 100/52 | HR 84 | Ht 64.0 in | Wt 169.2 lb

## 2017-07-13 DIAGNOSIS — I5042 Chronic combined systolic (congestive) and diastolic (congestive) heart failure: Secondary | ICD-10-CM | POA: Diagnosis not present

## 2017-07-13 DIAGNOSIS — I739 Peripheral vascular disease, unspecified: Secondary | ICD-10-CM | POA: Diagnosis not present

## 2017-07-13 DIAGNOSIS — I10 Essential (primary) hypertension: Secondary | ICD-10-CM | POA: Diagnosis not present

## 2017-07-13 DIAGNOSIS — I480 Paroxysmal atrial fibrillation: Secondary | ICD-10-CM

## 2017-07-13 DIAGNOSIS — I6523 Occlusion and stenosis of bilateral carotid arteries: Secondary | ICD-10-CM

## 2017-07-13 DIAGNOSIS — J432 Centrilobular emphysema: Secondary | ICD-10-CM

## 2017-07-13 DIAGNOSIS — E118 Type 2 diabetes mellitus with unspecified complications: Secondary | ICD-10-CM

## 2017-07-13 MED ORDER — AMLODIPINE-OLMESARTAN 5-40 MG PO TABS: 0.5000 | ORAL_TABLET | Freq: Every day | ORAL | 3 refills | Status: AC

## 2017-07-13 NOTE — Telephone Encounter (Signed)
Form faxed back. Confirmation received.

## 2017-07-13 NOTE — Patient Instructions (Addendum)
Medication Instructions:  Your physician has recommended you make the following change in your medication:  1. NO Torsemide Tomorrow 07/14/17 2. START Torsemide 20 mg 2 pills (40 mg) in the AM AND 2 pills (40 mg) after lunch at 2 PM  3. DO NOT TAKE Torsemide OR Potassium on the WEEKENDS 4. CUT Amlodipine-Olmesartan 5-40 mg down to 1/2 tablet once daily (2.5-20 mg)  GOAL weight 170-173 pounds.  Your physician recommends that you weigh, daily, at the same time every day, and in the same amount of clothing. Please record your daily weights on the handout provided and bring it to your next appointment.    Labwork:  No new labs needed  Testing/Procedures:  No further testing at this time   Follow-Up: It was a pleasure seeing you in the office today. Please call us if you have new issues that need to be addressed before your next appt.  [phone]  Your physician wants you to follow-up in: 6 months.  You will receive a reminder letter in the mail two months in advance. If you don't receive a letter, please call our office to schedule the follow-up appointment.  If you need a refill on your cardiac medications before your next appointment, please call your pharmacy.  For educational health videos Log in to : [email or web address] Or : [email or web address], password : [email or web address]

## 2017-07-14 DIAGNOSIS — D510 Vitamin B12 deficiency anemia due to intrinsic factor deficiency: Secondary | ICD-10-CM | POA: Diagnosis not present

## 2017-07-14 DIAGNOSIS — C50412 Malignant neoplasm of upper-outer quadrant of left female breast: Secondary | ICD-10-CM | POA: Diagnosis not present

## 2017-07-14 DIAGNOSIS — I4891 Unspecified atrial fibrillation: Secondary | ICD-10-CM | POA: Diagnosis not present

## 2017-07-14 DIAGNOSIS — E119 Type 2 diabetes mellitus without complications: Secondary | ICD-10-CM | POA: Diagnosis not present

## 2017-07-14 DIAGNOSIS — I5033 Acute on chronic diastolic (congestive) heart failure: Secondary | ICD-10-CM | POA: Diagnosis not present

## 2017-07-14 DIAGNOSIS — J449 Chronic obstructive pulmonary disease, unspecified: Secondary | ICD-10-CM | POA: Diagnosis not present

## 2017-07-14 DIAGNOSIS — K265 Chronic or unspecified duodenal ulcer with perforation: Secondary | ICD-10-CM | POA: Diagnosis not present

## 2017-07-14 DIAGNOSIS — I214 Non-ST elevation (NSTEMI) myocardial infarction: Secondary | ICD-10-CM | POA: Diagnosis not present

## 2017-07-14 DIAGNOSIS — K551 Chronic vascular disorders of intestine: Secondary | ICD-10-CM | POA: Diagnosis not present

## 2017-07-14 DIAGNOSIS — I951 Orthostatic hypotension: Secondary | ICD-10-CM | POA: Diagnosis not present

## 2017-07-16 DIAGNOSIS — K551 Chronic vascular disorders of intestine: Secondary | ICD-10-CM | POA: Diagnosis not present

## 2017-07-16 DIAGNOSIS — K265 Chronic or unspecified duodenal ulcer with perforation: Secondary | ICD-10-CM | POA: Diagnosis not present

## 2017-07-16 DIAGNOSIS — I4891 Unspecified atrial fibrillation: Secondary | ICD-10-CM | POA: Diagnosis not present

## 2017-07-16 DIAGNOSIS — J449 Chronic obstructive pulmonary disease, unspecified: Secondary | ICD-10-CM | POA: Diagnosis not present

## 2017-07-16 DIAGNOSIS — E119 Type 2 diabetes mellitus without complications: Secondary | ICD-10-CM | POA: Diagnosis not present

## 2017-07-16 DIAGNOSIS — I5033 Acute on chronic diastolic (congestive) heart failure: Secondary | ICD-10-CM | POA: Diagnosis not present

## 2017-07-16 DIAGNOSIS — D510 Vitamin B12 deficiency anemia due to intrinsic factor deficiency: Secondary | ICD-10-CM | POA: Diagnosis not present

## 2017-07-16 DIAGNOSIS — I951 Orthostatic hypotension: Secondary | ICD-10-CM | POA: Diagnosis not present

## 2017-07-16 DIAGNOSIS — C50412 Malignant neoplasm of upper-outer quadrant of left female breast: Secondary | ICD-10-CM | POA: Diagnosis not present

## 2017-07-16 DIAGNOSIS — I214 Non-ST elevation (NSTEMI) myocardial infarction: Secondary | ICD-10-CM | POA: Diagnosis not present

## 2017-07-16 NOTE — Progress Notes (Signed)
Needs to have a referral in for CHF clinic

## 2017-07-19 ENCOUNTER — Telehealth: Payer: Self-pay

## 2017-07-19 DIAGNOSIS — I951 Orthostatic hypotension: Secondary | ICD-10-CM | POA: Diagnosis not present

## 2017-07-19 DIAGNOSIS — I4891 Unspecified atrial fibrillation: Secondary | ICD-10-CM | POA: Diagnosis not present

## 2017-07-19 DIAGNOSIS — I214 Non-ST elevation (NSTEMI) myocardial infarction: Secondary | ICD-10-CM | POA: Diagnosis not present

## 2017-07-19 DIAGNOSIS — K551 Chronic vascular disorders of intestine: Secondary | ICD-10-CM | POA: Diagnosis not present

## 2017-07-19 DIAGNOSIS — D510 Vitamin B12 deficiency anemia due to intrinsic factor deficiency: Secondary | ICD-10-CM | POA: Diagnosis not present

## 2017-07-19 DIAGNOSIS — E119 Type 2 diabetes mellitus without complications: Secondary | ICD-10-CM | POA: Diagnosis not present

## 2017-07-19 DIAGNOSIS — I5033 Acute on chronic diastolic (congestive) heart failure: Secondary | ICD-10-CM | POA: Diagnosis not present

## 2017-07-19 DIAGNOSIS — J449 Chronic obstructive pulmonary disease, unspecified: Secondary | ICD-10-CM | POA: Diagnosis not present

## 2017-07-19 DIAGNOSIS — K265 Chronic or unspecified duodenal ulcer with perforation: Secondary | ICD-10-CM | POA: Diagnosis not present

## 2017-07-19 DIAGNOSIS — C50412 Malignant neoplasm of upper-outer quadrant of left female breast: Secondary | ICD-10-CM | POA: Diagnosis not present

## 2017-07-19 NOTE — Telephone Encounter (Signed)
Spoke with patient at length and reviewed that Dr. Rockey Situ would like her to keep a follow up appointment with the [facility] in roughly 1 to 2 months to assist with her fluid management. Reviewed that we have her down for follow up in 6 months and also all medication changes at last visit. She verbalized understanding of our conversation, agreeable to make follow up appointment, and had no further questions at this time.

## 2017-07-19 NOTE — Telephone Encounter (Signed)
Please call pt regarding her BP medication. Also, pt states Mia Price, RN told her not to follow up with her unless Dr. Rockey Situ could not see her. Please advise.

## 2017-07-19 NOTE — Telephone Encounter (Signed)
Patient willing to schedule 1 to 2 month follow up with CHF clinic. Thanks

## 2017-07-19 NOTE — Telephone Encounter (Signed)
-----   Message from Valora Corporal, RN sent at 07/19/2017  8:55 AM EDT ----- Regarding: FW: Appointment needed. Could we please schedule patient to be seen in the CHF Clinic in 1-2 months per Dr. Donivan Scull request.     ----- Message ----- From: Rudi Coco Sent: 07/16/2017  12:42 PM To: Valora Corporal, RN Subject: Appointment needed.                              ----- Message ----- From: Valora Corporal, RN Sent: 07/16/2017   8:38 AM To: Rebeca [facility] Support Pool Subject: Appointment needed.                            Could we please schedule patient to be seen in the CHF Clinic in 1-2 months per Dr. Donivan Scull request.  Thanks again for your help with this. Lesleigh Noe  ----- Message ----- From: Minna Merritts, MD Sent: 07/13/2017   6:52 PM To: Valora Corporal, RN  Previously seen by CHF clinic Can we make sure she has follow-up in 1-2 months

## 2017-07-20 ENCOUNTER — Ambulatory Visit: Payer: Medicare Other | Admitting: Cardiovascular Disease

## 2017-07-21 DIAGNOSIS — K265 Chronic or unspecified duodenal ulcer with perforation: Secondary | ICD-10-CM | POA: Diagnosis not present

## 2017-07-21 DIAGNOSIS — I214 Non-ST elevation (NSTEMI) myocardial infarction: Secondary | ICD-10-CM | POA: Diagnosis not present

## 2017-07-21 DIAGNOSIS — C50412 Malignant neoplasm of upper-outer quadrant of left female breast: Secondary | ICD-10-CM | POA: Diagnosis not present

## 2017-07-21 DIAGNOSIS — J449 Chronic obstructive pulmonary disease, unspecified: Secondary | ICD-10-CM | POA: Diagnosis not present

## 2017-07-21 DIAGNOSIS — D510 Vitamin B12 deficiency anemia due to intrinsic factor deficiency: Secondary | ICD-10-CM | POA: Diagnosis not present

## 2017-07-21 DIAGNOSIS — I5033 Acute on chronic diastolic (congestive) heart failure: Secondary | ICD-10-CM | POA: Diagnosis not present

## 2017-07-21 DIAGNOSIS — K551 Chronic vascular disorders of intestine: Secondary | ICD-10-CM | POA: Diagnosis not present

## 2017-07-21 DIAGNOSIS — E119 Type 2 diabetes mellitus without complications: Secondary | ICD-10-CM | POA: Diagnosis not present

## 2017-07-21 DIAGNOSIS — I951 Orthostatic hypotension: Secondary | ICD-10-CM | POA: Diagnosis not present

## 2017-07-21 DIAGNOSIS — I4891 Unspecified atrial fibrillation: Secondary | ICD-10-CM | POA: Diagnosis not present

## 2017-07-23 DIAGNOSIS — K265 Chronic or unspecified duodenal ulcer with perforation: Secondary | ICD-10-CM | POA: Diagnosis not present

## 2017-07-23 DIAGNOSIS — E119 Type 2 diabetes mellitus without complications: Secondary | ICD-10-CM | POA: Diagnosis not present

## 2017-07-23 DIAGNOSIS — J449 Chronic obstructive pulmonary disease, unspecified: Secondary | ICD-10-CM | POA: Diagnosis not present

## 2017-07-23 DIAGNOSIS — D510 Vitamin B12 deficiency anemia due to intrinsic factor deficiency: Secondary | ICD-10-CM | POA: Diagnosis not present

## 2017-07-23 DIAGNOSIS — I5033 Acute on chronic diastolic (congestive) heart failure: Secondary | ICD-10-CM | POA: Diagnosis not present

## 2017-07-23 DIAGNOSIS — K551 Chronic vascular disorders of intestine: Secondary | ICD-10-CM | POA: Diagnosis not present

## 2017-07-23 DIAGNOSIS — I951 Orthostatic hypotension: Secondary | ICD-10-CM | POA: Diagnosis not present

## 2017-07-23 DIAGNOSIS — I214 Non-ST elevation (NSTEMI) myocardial infarction: Secondary | ICD-10-CM | POA: Diagnosis not present

## 2017-07-23 DIAGNOSIS — C50412 Malignant neoplasm of upper-outer quadrant of left female breast: Secondary | ICD-10-CM | POA: Diagnosis not present

## 2017-07-23 DIAGNOSIS — I4891 Unspecified atrial fibrillation: Secondary | ICD-10-CM | POA: Diagnosis not present

## 2017-07-26 DIAGNOSIS — D510 Vitamin B12 deficiency anemia due to intrinsic factor deficiency: Secondary | ICD-10-CM | POA: Diagnosis not present

## 2017-07-26 DIAGNOSIS — I951 Orthostatic hypotension: Secondary | ICD-10-CM | POA: Diagnosis not present

## 2017-07-26 DIAGNOSIS — C50412 Malignant neoplasm of upper-outer quadrant of left female breast: Secondary | ICD-10-CM | POA: Diagnosis not present

## 2017-07-26 DIAGNOSIS — J449 Chronic obstructive pulmonary disease, unspecified: Secondary | ICD-10-CM | POA: Diagnosis not present

## 2017-07-26 DIAGNOSIS — K265 Chronic or unspecified duodenal ulcer with perforation: Secondary | ICD-10-CM | POA: Diagnosis not present

## 2017-07-26 DIAGNOSIS — K551 Chronic vascular disorders of intestine: Secondary | ICD-10-CM | POA: Diagnosis not present

## 2017-07-26 DIAGNOSIS — I5033 Acute on chronic diastolic (congestive) heart failure: Secondary | ICD-10-CM | POA: Diagnosis not present

## 2017-07-26 DIAGNOSIS — E119 Type 2 diabetes mellitus without complications: Secondary | ICD-10-CM | POA: Diagnosis not present

## 2017-07-26 DIAGNOSIS — I214 Non-ST elevation (NSTEMI) myocardial infarction: Secondary | ICD-10-CM | POA: Diagnosis not present

## 2017-07-26 DIAGNOSIS — I4891 Unspecified atrial fibrillation: Secondary | ICD-10-CM | POA: Diagnosis not present

## 2017-07-28 DIAGNOSIS — C50412 Malignant neoplasm of upper-outer quadrant of left female breast: Secondary | ICD-10-CM | POA: Diagnosis not present

## 2017-07-28 DIAGNOSIS — E119 Type 2 diabetes mellitus without complications: Secondary | ICD-10-CM | POA: Diagnosis not present

## 2017-07-28 DIAGNOSIS — D510 Vitamin B12 deficiency anemia due to intrinsic factor deficiency: Secondary | ICD-10-CM | POA: Diagnosis not present

## 2017-07-28 DIAGNOSIS — I951 Orthostatic hypotension: Secondary | ICD-10-CM | POA: Diagnosis not present

## 2017-07-28 DIAGNOSIS — I5033 Acute on chronic diastolic (congestive) heart failure: Secondary | ICD-10-CM | POA: Diagnosis not present

## 2017-07-28 DIAGNOSIS — I214 Non-ST elevation (NSTEMI) myocardial infarction: Secondary | ICD-10-CM | POA: Diagnosis not present

## 2017-07-28 DIAGNOSIS — J449 Chronic obstructive pulmonary disease, unspecified: Secondary | ICD-10-CM | POA: Diagnosis not present

## 2017-07-28 DIAGNOSIS — K265 Chronic or unspecified duodenal ulcer with perforation: Secondary | ICD-10-CM | POA: Diagnosis not present

## 2017-07-28 DIAGNOSIS — K551 Chronic vascular disorders of intestine: Secondary | ICD-10-CM | POA: Diagnosis not present

## 2017-07-28 DIAGNOSIS — I4891 Unspecified atrial fibrillation: Secondary | ICD-10-CM | POA: Diagnosis not present

## 2017-07-30 DIAGNOSIS — E119 Type 2 diabetes mellitus without complications: Secondary | ICD-10-CM | POA: Diagnosis not present

## 2017-07-30 DIAGNOSIS — J449 Chronic obstructive pulmonary disease, unspecified: Secondary | ICD-10-CM | POA: Diagnosis not present

## 2017-07-30 DIAGNOSIS — I214 Non-ST elevation (NSTEMI) myocardial infarction: Secondary | ICD-10-CM | POA: Diagnosis not present

## 2017-07-30 DIAGNOSIS — K551 Chronic vascular disorders of intestine: Secondary | ICD-10-CM | POA: Diagnosis not present

## 2017-07-30 DIAGNOSIS — I4891 Unspecified atrial fibrillation: Secondary | ICD-10-CM | POA: Diagnosis not present

## 2017-07-30 DIAGNOSIS — I951 Orthostatic hypotension: Secondary | ICD-10-CM | POA: Diagnosis not present

## 2017-07-30 DIAGNOSIS — K265 Chronic or unspecified duodenal ulcer with perforation: Secondary | ICD-10-CM | POA: Diagnosis not present

## 2017-07-30 DIAGNOSIS — D510 Vitamin B12 deficiency anemia due to intrinsic factor deficiency: Secondary | ICD-10-CM | POA: Diagnosis not present

## 2017-07-30 DIAGNOSIS — C50412 Malignant neoplasm of upper-outer quadrant of left female breast: Secondary | ICD-10-CM | POA: Diagnosis not present

## 2017-07-30 DIAGNOSIS — I5033 Acute on chronic diastolic (congestive) heart failure: Secondary | ICD-10-CM | POA: Diagnosis not present

## 2017-08-02 DIAGNOSIS — C50412 Malignant neoplasm of upper-outer quadrant of left female breast: Secondary | ICD-10-CM | POA: Diagnosis not present

## 2017-08-02 DIAGNOSIS — I5033 Acute on chronic diastolic (congestive) heart failure: Secondary | ICD-10-CM | POA: Diagnosis not present

## 2017-08-02 DIAGNOSIS — J449 Chronic obstructive pulmonary disease, unspecified: Secondary | ICD-10-CM | POA: Diagnosis not present

## 2017-08-02 DIAGNOSIS — K265 Chronic or unspecified duodenal ulcer with perforation: Secondary | ICD-10-CM | POA: Diagnosis not present

## 2017-08-02 DIAGNOSIS — I214 Non-ST elevation (NSTEMI) myocardial infarction: Secondary | ICD-10-CM | POA: Diagnosis not present

## 2017-08-02 DIAGNOSIS — I951 Orthostatic hypotension: Secondary | ICD-10-CM | POA: Diagnosis not present

## 2017-08-02 DIAGNOSIS — K551 Chronic vascular disorders of intestine: Secondary | ICD-10-CM | POA: Diagnosis not present

## 2017-08-02 DIAGNOSIS — E119 Type 2 diabetes mellitus without complications: Secondary | ICD-10-CM | POA: Diagnosis not present

## 2017-08-02 DIAGNOSIS — I4891 Unspecified atrial fibrillation: Secondary | ICD-10-CM | POA: Diagnosis not present

## 2017-08-02 DIAGNOSIS — H401133 Primary open-angle glaucoma, bilateral, severe stage: Secondary | ICD-10-CM | POA: Diagnosis not present

## 2017-08-02 DIAGNOSIS — D510 Vitamin B12 deficiency anemia due to intrinsic factor deficiency: Secondary | ICD-10-CM | POA: Diagnosis not present

## 2017-08-03 DIAGNOSIS — K265 Chronic or unspecified duodenal ulcer with perforation: Secondary | ICD-10-CM | POA: Diagnosis not present

## 2017-08-03 DIAGNOSIS — D510 Vitamin B12 deficiency anemia due to intrinsic factor deficiency: Secondary | ICD-10-CM | POA: Diagnosis not present

## 2017-08-03 DIAGNOSIS — I214 Non-ST elevation (NSTEMI) myocardial infarction: Secondary | ICD-10-CM | POA: Diagnosis not present

## 2017-08-03 DIAGNOSIS — C50412 Malignant neoplasm of upper-outer quadrant of left female breast: Secondary | ICD-10-CM | POA: Diagnosis not present

## 2017-08-03 DIAGNOSIS — I5033 Acute on chronic diastolic (congestive) heart failure: Secondary | ICD-10-CM | POA: Diagnosis not present

## 2017-08-03 DIAGNOSIS — J449 Chronic obstructive pulmonary disease, unspecified: Secondary | ICD-10-CM | POA: Diagnosis not present

## 2017-08-03 DIAGNOSIS — K551 Chronic vascular disorders of intestine: Secondary | ICD-10-CM | POA: Diagnosis not present

## 2017-08-03 DIAGNOSIS — I951 Orthostatic hypotension: Secondary | ICD-10-CM | POA: Diagnosis not present

## 2017-08-03 DIAGNOSIS — E119 Type 2 diabetes mellitus without complications: Secondary | ICD-10-CM | POA: Diagnosis not present

## 2017-08-03 DIAGNOSIS — I4891 Unspecified atrial fibrillation: Secondary | ICD-10-CM | POA: Diagnosis not present

## 2017-08-04 DIAGNOSIS — I214 Non-ST elevation (NSTEMI) myocardial infarction: Secondary | ICD-10-CM | POA: Diagnosis not present

## 2017-08-04 DIAGNOSIS — K265 Chronic or unspecified duodenal ulcer with perforation: Secondary | ICD-10-CM | POA: Diagnosis not present

## 2017-08-04 DIAGNOSIS — D510 Vitamin B12 deficiency anemia due to intrinsic factor deficiency: Secondary | ICD-10-CM | POA: Diagnosis not present

## 2017-08-04 DIAGNOSIS — I951 Orthostatic hypotension: Secondary | ICD-10-CM | POA: Diagnosis not present

## 2017-08-04 DIAGNOSIS — C50412 Malignant neoplasm of upper-outer quadrant of left female breast: Secondary | ICD-10-CM | POA: Diagnosis not present

## 2017-08-04 DIAGNOSIS — K551 Chronic vascular disorders of intestine: Secondary | ICD-10-CM | POA: Diagnosis not present

## 2017-08-04 DIAGNOSIS — J449 Chronic obstructive pulmonary disease, unspecified: Secondary | ICD-10-CM | POA: Diagnosis not present

## 2017-08-04 DIAGNOSIS — E119 Type 2 diabetes mellitus without complications: Secondary | ICD-10-CM | POA: Diagnosis not present

## 2017-08-04 DIAGNOSIS — I5033 Acute on chronic diastolic (congestive) heart failure: Secondary | ICD-10-CM | POA: Diagnosis not present

## 2017-08-04 DIAGNOSIS — I4891 Unspecified atrial fibrillation: Secondary | ICD-10-CM | POA: Diagnosis not present

## 2017-08-06 DIAGNOSIS — K551 Chronic vascular disorders of intestine: Secondary | ICD-10-CM | POA: Diagnosis not present

## 2017-08-06 DIAGNOSIS — D510 Vitamin B12 deficiency anemia due to intrinsic factor deficiency: Secondary | ICD-10-CM | POA: Diagnosis not present

## 2017-08-06 DIAGNOSIS — J449 Chronic obstructive pulmonary disease, unspecified: Secondary | ICD-10-CM | POA: Diagnosis not present

## 2017-08-06 DIAGNOSIS — I951 Orthostatic hypotension: Secondary | ICD-10-CM | POA: Diagnosis not present

## 2017-08-06 DIAGNOSIS — E119 Type 2 diabetes mellitus without complications: Secondary | ICD-10-CM | POA: Diagnosis not present

## 2017-08-06 DIAGNOSIS — I4891 Unspecified atrial fibrillation: Secondary | ICD-10-CM | POA: Diagnosis not present

## 2017-08-06 DIAGNOSIS — I5033 Acute on chronic diastolic (congestive) heart failure: Secondary | ICD-10-CM | POA: Diagnosis not present

## 2017-08-06 DIAGNOSIS — K265 Chronic or unspecified duodenal ulcer with perforation: Secondary | ICD-10-CM | POA: Diagnosis not present

## 2017-08-06 DIAGNOSIS — C50412 Malignant neoplasm of upper-outer quadrant of left female breast: Secondary | ICD-10-CM | POA: Diagnosis not present

## 2017-08-06 DIAGNOSIS — I214 Non-ST elevation (NSTEMI) myocardial infarction: Secondary | ICD-10-CM | POA: Diagnosis not present

## 2017-08-09 DIAGNOSIS — I4891 Unspecified atrial fibrillation: Secondary | ICD-10-CM | POA: Diagnosis not present

## 2017-08-09 DIAGNOSIS — I5033 Acute on chronic diastolic (congestive) heart failure: Secondary | ICD-10-CM | POA: Diagnosis not present

## 2017-08-09 DIAGNOSIS — J449 Chronic obstructive pulmonary disease, unspecified: Secondary | ICD-10-CM | POA: Diagnosis not present

## 2017-08-09 DIAGNOSIS — D510 Vitamin B12 deficiency anemia due to intrinsic factor deficiency: Secondary | ICD-10-CM | POA: Diagnosis not present

## 2017-08-09 DIAGNOSIS — I951 Orthostatic hypotension: Secondary | ICD-10-CM | POA: Diagnosis not present

## 2017-08-09 DIAGNOSIS — I214 Non-ST elevation (NSTEMI) myocardial infarction: Secondary | ICD-10-CM | POA: Diagnosis not present

## 2017-08-09 DIAGNOSIS — K265 Chronic or unspecified duodenal ulcer with perforation: Secondary | ICD-10-CM | POA: Diagnosis not present

## 2017-08-09 DIAGNOSIS — E119 Type 2 diabetes mellitus without complications: Secondary | ICD-10-CM | POA: Diagnosis not present

## 2017-08-09 DIAGNOSIS — K551 Chronic vascular disorders of intestine: Secondary | ICD-10-CM | POA: Diagnosis not present

## 2017-08-09 DIAGNOSIS — C50412 Malignant neoplasm of upper-outer quadrant of left female breast: Secondary | ICD-10-CM | POA: Diagnosis not present

## 2017-08-10 ENCOUNTER — Ambulatory Visit (INDEPENDENT_AMBULATORY_CARE_PROVIDER_SITE_OTHER): Payer: Medicare Other | Admitting: Family Medicine

## 2017-08-10 ENCOUNTER — Encounter: Payer: Self-pay | Admitting: Family Medicine

## 2017-08-10 VITALS — BP 107/74 | HR 93

## 2017-08-10 DIAGNOSIS — I5042 Chronic combined systolic (congestive) and diastolic (congestive) heart failure: Secondary | ICD-10-CM

## 2017-08-10 DIAGNOSIS — I10 Essential (primary) hypertension: Secondary | ICD-10-CM | POA: Diagnosis not present

## 2017-08-10 DIAGNOSIS — I890 Lymphedema, not elsewhere classified: Secondary | ICD-10-CM

## 2017-08-10 DIAGNOSIS — E118 Type 2 diabetes mellitus with unspecified complications: Secondary | ICD-10-CM | POA: Diagnosis not present

## 2017-08-10 DIAGNOSIS — Z9181 History of falling: Secondary | ICD-10-CM | POA: Insufficient documentation

## 2017-08-10 LAB — BAYER DCA HB A1C WAIVED: HB A1C (BAYER DCA - WAIVED): 6.7 % (ref <7.0)

## 2017-08-10 NOTE — Assessment & Plan Note (Signed)
Stable for now

## 2017-08-10 NOTE — Assessment & Plan Note (Signed)
Followed by cardiology with generalized debility uses a walker

## 2017-08-10 NOTE — Progress Notes (Signed)
   BP 107/74   Pulse 93   SpO2 100%    Subjective:    Patient ID: Mia Padilla, female    DOB: 1934/08/02, 83 y.o.   MRN: [id number]  HPI: Mia Padilla is a 83 y.o. female  Chief Complaint  Patient presents with  . Follow-up  . Fall    Pt hurt knee and shoulder  . Bedsore    Bottom.   Patient 3 to 4 weeks ago fell and hurt her shoulder has been to orthopedics and has torn rotator cuff and has due to other health status and age status surgery has been eliminated as an option.  Patient has not been to physical therapy yet. Patient accompanied by her brother and daughter-in-law. Reviewed cardiology notes and patient in heart failure clinic for fluid retention. Patient also wants some kind of home health evaluation as he lives at home and needs some assistance.  Patient has fallen 2 times this year and can get up by herself. Because she is wet a lot diuretics will need to help with diaper changing and bedsore care which she is aware of.   Relevant past medical, surgical, family and social history reviewed and updated as indicated. Interim medical history since our last visit reviewed. Allergies and medications reviewed and updated.  Review of Systems  Constitutional: Negative.   Respiratory: Negative.   Cardiovascular: Negative.     Per HPI unless specifically indicated above     Objective:    BP 107/74   Pulse 93   SpO2 100%   Wt Readings from Last 3 Encounters:  07/13/17 169 lb 4 oz (76.8 kg)  07/06/17 171 lb 6 oz (77.7 kg)  06/30/17 181 lb (82.1 kg)    Physical Exam  Constitutional: She is oriented to person, place, and time. She appears well-developed and well-nourished.  HENT:  Head: Normocephalic and atraumatic.  Eyes: Conjunctivae and EOM are normal.  Neck: Normal range of motion.  Cardiovascular: Normal rate, regular rhythm and normal heart sounds.  Pulmonary/Chest: Effort normal and breath sounds normal.  Musculoskeletal: Normal range of motion.    Neurological: She is alert and oriented to person, place, and time.  Skin: No erythema.  Psychiatric: She has a normal mood and affect. Her behavior is normal. Judgment and thought content normal.    Results for orders placed or performed in visit on 00/93/81  Basic metabolic panel  Result Value Ref Range   Sodium 140 135 - 145 mmol/L   Potassium 4.3 3.5 - 5.1 mmol/L   Chloride 97 (L) 101 - 111 mmol/L   CO2 30 22 - 32 mmol/L   Glucose, Bld 105 (H) 65 - 99 mg/dL   BUN 38 (H) 6 - 20 mg/dL   Creatinine, Ser 1.33 (H) 0.44 - 1.00 mg/dL   Calcium 9.4 8.9 - 10.3 mg/dL   GFR calc non Af Amer 37 (L) >60 mL/min   GFR calc Af Amer 43 (L) >60 mL/min   Anion gap 13 5 - 15      Assessment & Plan:   Problem List Items Addressed This Visit    None       Follow up plan: No follow-ups on file.

## 2017-08-10 NOTE — Assessment & Plan Note (Addendum)
The current medical regimen is effective;  continue present plan and medications. CKD on last blood work changes will reassess with BMP

## 2017-08-10 NOTE — Assessment & Plan Note (Signed)
Due to have risk for falling and falls will arrange for home health evaluation including physical therapy to evaluate strengthening shoulder care and generalized care and safety assessment.

## 2017-08-10 NOTE — Addendum Note (Signed)
Addended by: Gerda Diss A on: 08/10/2017 03:50 PM   Modules accepted: Orders

## 2017-08-11 ENCOUNTER — Encounter: Payer: Self-pay | Admitting: Family Medicine

## 2017-08-11 DIAGNOSIS — J449 Chronic obstructive pulmonary disease, unspecified: Secondary | ICD-10-CM | POA: Diagnosis not present

## 2017-08-11 DIAGNOSIS — E119 Type 2 diabetes mellitus without complications: Secondary | ICD-10-CM | POA: Diagnosis not present

## 2017-08-11 DIAGNOSIS — I5033 Acute on chronic diastolic (congestive) heart failure: Secondary | ICD-10-CM | POA: Diagnosis not present

## 2017-08-11 DIAGNOSIS — I951 Orthostatic hypotension: Secondary | ICD-10-CM | POA: Diagnosis not present

## 2017-08-11 DIAGNOSIS — K551 Chronic vascular disorders of intestine: Secondary | ICD-10-CM | POA: Diagnosis not present

## 2017-08-11 DIAGNOSIS — I4891 Unspecified atrial fibrillation: Secondary | ICD-10-CM | POA: Diagnosis not present

## 2017-08-11 DIAGNOSIS — C50412 Malignant neoplasm of upper-outer quadrant of left female breast: Secondary | ICD-10-CM | POA: Diagnosis not present

## 2017-08-11 DIAGNOSIS — I214 Non-ST elevation (NSTEMI) myocardial infarction: Secondary | ICD-10-CM | POA: Diagnosis not present

## 2017-08-11 DIAGNOSIS — K265 Chronic or unspecified duodenal ulcer with perforation: Secondary | ICD-10-CM | POA: Diagnosis not present

## 2017-08-11 DIAGNOSIS — D510 Vitamin B12 deficiency anemia due to intrinsic factor deficiency: Secondary | ICD-10-CM | POA: Diagnosis not present

## 2017-08-11 LAB — BASIC METABOLIC PANEL
BUN / CREAT RATIO: 20 (ref 12–28)
BUN: 23 mg/dL (ref 8–27)
CHLORIDE: 100 mmol/L (ref 96–106)
CO2: 27 mmol/L (ref 20–29)
Calcium: 9.1 mg/dL (ref 8.7–10.3)
Creatinine, Ser: 1.16 mg/dL — ABNORMAL HIGH (ref 0.57–1.00)
GFR, EST AFRICAN AMERICAN: 51 mL/min/{1.73_m2} — AB (ref >59)
GFR, EST NON AFRICAN AMERICAN: 45 mL/min/{1.73_m2} — AB (ref >59)
Glucose: 93 mg/dL (ref 65–99)
POTASSIUM: 4.8 mmol/L (ref 3.5–5.2)
Sodium: 139 mmol/L (ref 134–144)

## 2017-08-13 DIAGNOSIS — K265 Chronic or unspecified duodenal ulcer with perforation: Secondary | ICD-10-CM | POA: Diagnosis not present

## 2017-08-13 DIAGNOSIS — K551 Chronic vascular disorders of intestine: Secondary | ICD-10-CM | POA: Diagnosis not present

## 2017-08-13 DIAGNOSIS — C50412 Malignant neoplasm of upper-outer quadrant of left female breast: Secondary | ICD-10-CM | POA: Diagnosis not present

## 2017-08-13 DIAGNOSIS — D510 Vitamin B12 deficiency anemia due to intrinsic factor deficiency: Secondary | ICD-10-CM | POA: Diagnosis not present

## 2017-08-13 DIAGNOSIS — I4891 Unspecified atrial fibrillation: Secondary | ICD-10-CM | POA: Diagnosis not present

## 2017-08-13 DIAGNOSIS — I5033 Acute on chronic diastolic (congestive) heart failure: Secondary | ICD-10-CM | POA: Diagnosis not present

## 2017-08-13 DIAGNOSIS — E119 Type 2 diabetes mellitus without complications: Secondary | ICD-10-CM | POA: Diagnosis not present

## 2017-08-13 DIAGNOSIS — I214 Non-ST elevation (NSTEMI) myocardial infarction: Secondary | ICD-10-CM | POA: Diagnosis not present

## 2017-08-13 DIAGNOSIS — I951 Orthostatic hypotension: Secondary | ICD-10-CM | POA: Diagnosis not present

## 2017-08-13 DIAGNOSIS — J449 Chronic obstructive pulmonary disease, unspecified: Secondary | ICD-10-CM | POA: Diagnosis not present

## 2017-08-16 DIAGNOSIS — K551 Chronic vascular disorders of intestine: Secondary | ICD-10-CM | POA: Diagnosis not present

## 2017-08-16 DIAGNOSIS — I5033 Acute on chronic diastolic (congestive) heart failure: Secondary | ICD-10-CM | POA: Diagnosis not present

## 2017-08-16 DIAGNOSIS — I951 Orthostatic hypotension: Secondary | ICD-10-CM | POA: Diagnosis not present

## 2017-08-16 DIAGNOSIS — K265 Chronic or unspecified duodenal ulcer with perforation: Secondary | ICD-10-CM | POA: Diagnosis not present

## 2017-08-16 DIAGNOSIS — J449 Chronic obstructive pulmonary disease, unspecified: Secondary | ICD-10-CM | POA: Diagnosis not present

## 2017-08-16 DIAGNOSIS — I4891 Unspecified atrial fibrillation: Secondary | ICD-10-CM | POA: Diagnosis not present

## 2017-08-16 DIAGNOSIS — E119 Type 2 diabetes mellitus without complications: Secondary | ICD-10-CM | POA: Diagnosis not present

## 2017-08-16 DIAGNOSIS — C50412 Malignant neoplasm of upper-outer quadrant of left female breast: Secondary | ICD-10-CM | POA: Diagnosis not present

## 2017-08-16 DIAGNOSIS — D510 Vitamin B12 deficiency anemia due to intrinsic factor deficiency: Secondary | ICD-10-CM | POA: Diagnosis not present

## 2017-08-16 DIAGNOSIS — I214 Non-ST elevation (NSTEMI) myocardial infarction: Secondary | ICD-10-CM | POA: Diagnosis not present

## 2017-08-17 NOTE — Progress Notes (Signed)
Patient ID: Mia Padilla, female    DOB: [birth year], 83 y.o.   MRN: [id number]  HPI  Mia Padilla is an 83 y/o female with a history of breast cancer, COPD, diabetes, glaucoma, HTN, hyperlipidemia, DVT, atrial fibrillation, remote tobacco use and chronic heart failure.   Echo report from 01/24/17 reviewed and shows an EF of 40% along with mild MR and a PA pressure of 50-55 mm Hg.  Was in the ED 06/30/17 due to a mechanical fall. Right knee and right shoulder xrays were negative and she was released. Was in the ED 06/02/17 due to HF exacerbation. Diuretic dose adjusted and she was released. Was in the ED 02/22/17 due to hypotension due to hypovolemia. IV fluids were given and she was released home.   She presents today for a follow-up visit with a chief complaint of moderate fatigue upon minimal exertion. She says that this has been present for several years. She has associated shortness of breath, chest pain, edema, dizziness, light-headedness and difficulty sleeping along with this. She denies any abdominal distention, palpitations or cough. Says her weight fluctuates between 169-174 at home but that she hasn't had an overnight weight gain >2 pounds. She has continued to take her potassium/diuretic daily. Admits that she's probably drinking too much fluids during the day.   Past Medical History:  Diagnosis Date  . Arthritis    hands  . Black head   . Bowel incontinence   . Breast cancer ([place]) 11/15/2013  . Breast cancer of upper-outer quadrant of left female breast ([place]) 2015   Left  . Carotid artery stenosis    a. ultrasound 11/1854: 3-14 LICA stenosis, 97-02% RICA stenosis; b. followed by vascular  . Carotid stenosis 11/17/2016  . Carpal tunnel syndrome   . Chronic combined systolic and diastolic CHF, NYHA class 3 ([place])    a. TTE 10/18: EF 40%, diffuse HK, DD, calcifief mitral annulus with mild MR, mildly dilated left atrium, normal RV cavity size and systolic function, PASP 63-78 mmHg  . COPD  (chronic obstructive pulmonary disease) ([place])   . Debilitated   . Diabetes mellitus with complication ([place])   . Glaucoma   . History of seizures   . History of stress test    a. 11/18: small defect of mild severity present in the apex location felt to be 2/2 breast attenuation. Normal study  . Hyperlipidemia   . Hypertension   . Hypomagnesemia   . Hypotension   . Hypothyroidism 02/07/2015  . Mesenteric artery stenosis ([place])   . Obese 06/04/2011  . Obesity   . PAF (paroxysmal atrial fibrillation) ([facility])    a. on Eliquis; b. CHADS2VASc 8 (CHF, HTN, age x 2, DM, vascular disease, sex category)  . Perforated duodenal ulcer ([place]) 01/2017   a. conservatively managed  . Peripheral vascular disease ([place]) 04/23/2015  . SMA stenosis ([place])    a. noted 10/18; b. conservatively managed; c. unclear if thrombus vs plaque; d. on Eliquis given PAF and Lipitor  . Urinary incontinence    Past Surgical History:  Procedure Laterality Date  . BOWEL RESECTION  2005  . BREAST SURGERY  08/03/13   left mastectomy  . CARPAL TUNNEL RELEASE Right 30 years   . MASTECTOMY     left   . PARTIAL HYSTERECTOMY    . TONSILLECTOMY    . UMBILICAL HERNIA REPAIR     Family History  Problem Relation Age of Onset  . Cervical cancer Mother   . Diabetes  Mother   . Glaucoma Mother   . Cervical cancer Daughter   . Hypertension Daughter   . Hypertension Father   . Hypertension Sister   . Hypertension Brother   . Hypertension Son   . Diabetes Brother   . HIV Brother   . Breast cancer Neg Hx    Social History   Tobacco Use  . Smoking status: Former Smoker    Last attempt to quit: 04/20/1974    Years since quitting: 43.3  . Smokeless tobacco: Never Used  . Tobacco comment: Quit 1976  Substance Use Topics  . Alcohol use: No   Allergies  Allergen Reactions  . Propoxyphene Other (See Comments)    Other Reaction: Severe Headache  . Advil [Ibuprofen] Other (See Comments)    Makes her heart race  . Darvocet  [Propoxyphene N-Acetaminophen]   . Percocet [Oxycodone-Acetaminophen]     hallucination   Prior to Admission medications   Medication Sig Start Date End Date Taking? Authorizing Provider  acetaminophen (TYLENOL) 650 MG CR tablet Take 650 mg by mouth every 8 (eight) hours as needed for pain.   Yes [provider]  amLODipine-olmesartan (AZOR) 5-40 MG tablet Take 0.5 tablets by mouth daily. 07/13/17  Yes Minna Merritts, MD  anastrozole (ARIMIDEX) 1 MG tablet TAKE 1 TABLET BY MOUTH ONCE DAILY 06/16/17  Yes Byrnett, Forest Gleason, MD  apixaban (ELIQUIS) 5 MG TABS tablet Take 5 mg by mouth 2 (two) times daily.   Yes [provider]  atorvastatin (LIPITOR) 20 MG tablet Take 20 mg by mouth daily.   Yes [provider]  blood glucose meter kit and supplies Diagnosis:E11.9 Test glucose twice daily 11/21/14  Yes Johnson, Megan P, DO  brimonidine (ALPHAGAN P) 0.1 % SOLN 2 (two) times daily.    Yes [provider]  cyanocobalamin (,VITAMIN B-12,) 1000 MCG/ML injection Inject 1,000 mcg into the muscle every 30 (thirty) days.   Yes [provider]  dorzolamide (TRUSOPT) 2 % ophthalmic solution 1 drop 2 (two) times daily.    Yes [provider]  fluticasone (FLONASE) 50 MCG/ACT nasal spray Place 2 sprays into both nostrils daily. 10/14/15  Yes Volney American, PA-C  Fluticasone-Salmeterol (ADVAIR) 250-50 MCG/DOSE AEPB Inhale 1 puff into the lungs every 12 (twelve) hours. 08/18/16  Yes Crissman, Jeannette How, MD  latanoprost (XALATAN) 0.005 % ophthalmic solution Place 1 drop into both eyes at bedtime.   Yes [provider]  LEVEMIR FLEXTOUCH 100 UNIT/ML Pen Inject 6 Units into the skin daily at 10 pm. INJECT 6 UNITS PER DAY 01/31/17  Yes Wieting, Richard, MD  levothyroxine (SYNTHROID, LEVOTHROID) 100 MCG tablet Take 1 tablet (100 mcg total) by mouth daily. 11/19/16  Yes Crissman, Jeannette How, MD  magnesium oxide (MAG-OX) 400 MG tablet Take 1 tablet (400 mg total)  by mouth 2 (two) times daily. 04/07/17  Yes Theora Gianotti, NP  metoprolol succinate (TOPROL-XL) 100 MG 24 hr tablet Take 1 tablet (100 mg total) by mouth daily. 08/18/16  Yes Crissman, Jeannette How, MD  mometasone (ELOCON) 0.1 % cream Apply 1 application topically daily. Apply 1-2 times daily for no longer than 2 weeks at a time 11/24/16  Yes Orene Desanctis, Lilia Argue, PA-C  mometasone-formoterol [facility]) 200-5 MCG/ACT AERO Inhale 2 puffs into the lungs 2 (two) times daily. 02/09/17  Yes Johnson, Megan P, DO  nystatin (MYCOSTATIN/NYSTOP) powder Apply topically 4 (four) times daily.   Yes [provider]  Olopatadine HCl (PATADAY) 0.2 % SOLN  Apply 1 drop to eye daily.    Yes [provider]  pantoprazole (PROTONIX) 40 MG tablet Take 1 tablet (40 mg total) by mouth daily. 06/15/17 06/15/18 Yes Gollan, Kathlene November, MD  potassium chloride SA (K-DUR,KLOR-CON) 20 MEQ tablet Take 1 tablet (20 mEq total) by mouth 2 (two) times daily. 06/15/17  Yes Minna Merritts, MD  solifenacin (VESICARE) 10 MG tablet Take 1 tablet (10 mg total) daily by mouth. 03/05/17  Yes Crissman, Jeannette How, MD  torsemide (DEMADEX) 20 MG tablet Take 2 tablets (40 mg total) by mouth 2 (two) times daily. 06/15/17 09/13/17 Yes Gollan, Kathlene November, MD  alendronate (FOSAMAX) 70 MG tablet Take 70 mg by mouth once a week. Take with a full glass of water on an empty stomach.    [provider]  digoxin (LANOXIN) 0.125 MG tablet Take by mouth daily.    [provider]  feeding supplement, ENSURE ENLIVE, (ENSURE ENLIVE) LIQD Take 237 mLs by mouth 2 (two) times daily between meals. Patient not taking: Reported on 08/19/2017 01/31/17   Loletha Grayer, MD  glucose blood test strip Use as instructed Patient not taking: Reported on 08/19/2017 11/05/14   [name], MD    Review of Systems  Constitutional: Positive for fatigue. Negative for appetite change.  HENT: Positive for rhinorrhea (occasionally). Negative for  congestion and sore throat.   Eyes: Positive for visual disturbance (blurry vision).  Respiratory: Positive for shortness of breath. Negative for cough and chest tightness.   Cardiovascular: Positive for chest pain (infrequent) and leg swelling (R>L). Negative for palpitations.  Gastrointestinal: Negative for abdominal distention and abdominal pain.  Endocrine: Negative.   Genitourinary: Negative.   Musculoskeletal: Positive for arthralgias (right shoulder and right knee pain). Negative for back pain.  Skin: Negative.   Allergic/Immunologic: Negative.   Neurological: Positive for dizziness and light-headedness (at times).  Hematological: Negative for adenopathy. Does not bruise/bleed easily.  Psychiatric/Behavioral: Positive for sleep disturbance (sleeping more during the day). Negative for dysphoric mood. The patient is not nervous/anxious.    Vitals:   08/19/17 1119  BP: 108/71  Pulse: 81  Resp: 18  SpO2: 98%  Weight: 181 lb 4 oz (82.2 kg)  Height: '5\' 4"'$  (1.626 m)   Wt Readings from Last 3 Encounters:  08/19/17 181 lb 4 oz (82.2 kg)  07/13/17 169 lb 4 oz (76.8 kg)  07/06/17 171 lb 6 oz (77.7 kg)   Lab Results  Component Value Date   CREATININE 1.16 (H) 08/10/2017   CREATININE 1.33 (H) 07/06/2017   CREATININE 0.94 06/02/2017    Physical Exam  Constitutional: She is oriented to person, place, and time. She appears well-developed and well-nourished.  HENT:  Head: Normocephalic and atraumatic.  Neck: Normal range of motion. Neck supple. No JVD present.  Cardiovascular: Normal rate. An irregular rhythm present.  Pulmonary/Chest: Effort normal. She has no wheezes. She has no rales.  Abdominal: Soft. She exhibits no distension. There is no tenderness.  Musculoskeletal: She exhibits edema (2+ pitting edema in bilateral lower legs). She exhibits no tenderness.  Neurological: She is alert and oriented to person, place, and time.  Skin: Skin is warm and dry.  Psychiatric: She has  a normal mood and affect. Her behavior is normal. Thought content normal.  Nursing note and vitals reviewed.  Assessment & Plan:  1: Chronic heart failure with preserved ejection fraction- - NYHA class III - mildly fluid overloaded today with pedal edema - weighing daily and she was  reminded to call for an overnight weight gain of >2 pounds or a weekly weight gain of >5 pounds - weight up ~ 10 pounds since she was last here 07/06/17 - has continued to take torsemide/potassium daily - not adding salt and says that she lives with her son/daughter-in-law and they don't cook with salt. Reviewed the importance of reading food labels so that she can keep daily sodium intake to '2000mg'$ .  - drinking "water, water and more water". Discussed drinking between 40-60 ounces of fluid daily - saw cardiology Rockey Situ) 07/13/17 - BNP 06/02/17 was 870.0  2: HTN- - BP looks good today - saw PCP (Crissman ) 08/10/17 - BMP from 08/10/17 reviewed and shows sodium 139, potassium 4.8 and GFR 51  3: Atrial fibrillaion- - continues on apixaban & metoprolol  4: Lymphedema- - does elevate her legs - wearing support hose "sometimes"; encouraged her to wear them daily - limited in her ability to exercise due to vision changes and instability in walking without her walker - she continues to have worsening edema so will make a referral for lymphapress compression pumps  Medication bottles were reviewed.  Return in 3 months or sooner for any questions/problems before then.

## 2017-08-18 ENCOUNTER — Telehealth: Payer: Self-pay | Admitting: Family Medicine

## 2017-08-18 DIAGNOSIS — I4891 Unspecified atrial fibrillation: Secondary | ICD-10-CM | POA: Diagnosis not present

## 2017-08-18 DIAGNOSIS — K265 Chronic or unspecified duodenal ulcer with perforation: Secondary | ICD-10-CM | POA: Diagnosis not present

## 2017-08-18 DIAGNOSIS — D510 Vitamin B12 deficiency anemia due to intrinsic factor deficiency: Secondary | ICD-10-CM | POA: Diagnosis not present

## 2017-08-18 DIAGNOSIS — C50412 Malignant neoplasm of upper-outer quadrant of left female breast: Secondary | ICD-10-CM | POA: Diagnosis not present

## 2017-08-18 DIAGNOSIS — J449 Chronic obstructive pulmonary disease, unspecified: Secondary | ICD-10-CM | POA: Diagnosis not present

## 2017-08-18 DIAGNOSIS — E119 Type 2 diabetes mellitus without complications: Secondary | ICD-10-CM | POA: Diagnosis not present

## 2017-08-18 DIAGNOSIS — I214 Non-ST elevation (NSTEMI) myocardial infarction: Secondary | ICD-10-CM | POA: Diagnosis not present

## 2017-08-18 DIAGNOSIS — I5033 Acute on chronic diastolic (congestive) heart failure: Secondary | ICD-10-CM | POA: Diagnosis not present

## 2017-08-18 DIAGNOSIS — K551 Chronic vascular disorders of intestine: Secondary | ICD-10-CM | POA: Diagnosis not present

## 2017-08-18 DIAGNOSIS — I951 Orthostatic hypotension: Secondary | ICD-10-CM | POA: Diagnosis not present

## 2017-08-18 NOTE — Telephone Encounter (Signed)
ok 

## 2017-08-18 NOTE — Telephone Encounter (Signed)
Copied from [place] [id number]. Topic: Quick Communication - See Telephone Encounter >> Aug 18, 2017 10:48 AM Conception Chancy, NT wrote: CRM for notification. See Telephone encounter for: 08/18/17.  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 States okay to take orders from Dr. Rockey Situ and she would also like a order for PT evaluation.   Cb# [phone]

## 2017-08-18 NOTE — Telephone Encounter (Signed)
Verbal given 

## 2017-08-19 ENCOUNTER — Encounter: Payer: Self-pay | Admitting: Family

## 2017-08-19 ENCOUNTER — Ambulatory Visit: Payer: Medicare Other | Attending: Family | Admitting: Family

## 2017-08-19 ENCOUNTER — Ambulatory Visit: Payer: Medicare Other | Admitting: Family

## 2017-08-19 VITALS — BP 108/71 | HR 81 | Resp 18 | Ht 64.0 in | Wt 181.2 lb

## 2017-08-19 DIAGNOSIS — E785 Hyperlipidemia, unspecified: Secondary | ICD-10-CM | POA: Insufficient documentation

## 2017-08-19 DIAGNOSIS — I5042 Chronic combined systolic (congestive) and diastolic (congestive) heart failure: Secondary | ICD-10-CM | POA: Diagnosis not present

## 2017-08-19 DIAGNOSIS — E669 Obesity, unspecified: Secondary | ICD-10-CM | POA: Diagnosis not present

## 2017-08-19 DIAGNOSIS — E1151 Type 2 diabetes mellitus with diabetic peripheral angiopathy without gangrene: Secondary | ICD-10-CM | POA: Insufficient documentation

## 2017-08-19 DIAGNOSIS — I5032 Chronic diastolic (congestive) heart failure: Secondary | ICD-10-CM | POA: Insufficient documentation

## 2017-08-19 DIAGNOSIS — Z87891 Personal history of nicotine dependence: Secondary | ICD-10-CM | POA: Diagnosis not present

## 2017-08-19 DIAGNOSIS — Z86718 Personal history of other venous thrombosis and embolism: Secondary | ICD-10-CM | POA: Diagnosis not present

## 2017-08-19 DIAGNOSIS — I110 Hypertensive heart disease with heart failure: Secondary | ICD-10-CM | POA: Diagnosis not present

## 2017-08-19 DIAGNOSIS — H409 Unspecified glaucoma: Secondary | ICD-10-CM | POA: Insufficient documentation

## 2017-08-19 DIAGNOSIS — Z7951 Long term (current) use of inhaled steroids: Secondary | ICD-10-CM | POA: Insufficient documentation

## 2017-08-19 DIAGNOSIS — Z886 Allergy status to analgesic agent status: Secondary | ICD-10-CM | POA: Insufficient documentation

## 2017-08-19 DIAGNOSIS — I10 Essential (primary) hypertension: Secondary | ICD-10-CM

## 2017-08-19 DIAGNOSIS — E861 Hypovolemia: Secondary | ICD-10-CM | POA: Diagnosis not present

## 2017-08-19 DIAGNOSIS — I9589 Other hypotension: Secondary | ICD-10-CM | POA: Insufficient documentation

## 2017-08-19 DIAGNOSIS — Z7983 Long term (current) use of bisphosphonates: Secondary | ICD-10-CM | POA: Insufficient documentation

## 2017-08-19 DIAGNOSIS — J449 Chronic obstructive pulmonary disease, unspecified: Secondary | ICD-10-CM | POA: Diagnosis not present

## 2017-08-19 DIAGNOSIS — I480 Paroxysmal atrial fibrillation: Secondary | ICD-10-CM

## 2017-08-19 DIAGNOSIS — Z853 Personal history of malignant neoplasm of breast: Secondary | ICD-10-CM | POA: Insufficient documentation

## 2017-08-19 DIAGNOSIS — G5600 Carpal tunnel syndrome, unspecified upper limb: Secondary | ICD-10-CM | POA: Insufficient documentation

## 2017-08-19 DIAGNOSIS — Z79899 Other long term (current) drug therapy: Secondary | ICD-10-CM | POA: Insufficient documentation

## 2017-08-19 DIAGNOSIS — Z8249 Family history of ischemic heart disease and other diseases of the circulatory system: Secondary | ICD-10-CM | POA: Insufficient documentation

## 2017-08-19 DIAGNOSIS — I890 Lymphedema, not elsewhere classified: Secondary | ICD-10-CM | POA: Insufficient documentation

## 2017-08-19 DIAGNOSIS — R609 Edema, unspecified: Secondary | ICD-10-CM | POA: Insufficient documentation

## 2017-08-19 DIAGNOSIS — E039 Hypothyroidism, unspecified: Secondary | ICD-10-CM | POA: Diagnosis not present

## 2017-08-19 NOTE — Patient Instructions (Addendum)
Continue weighing daily and call for an overnight weight gain of > 2 pounds or a weekly weight gain of >5 pounds.  Decrease fluid intake to 40-60 ounces daily.

## 2017-08-20 DIAGNOSIS — K265 Chronic or unspecified duodenal ulcer with perforation: Secondary | ICD-10-CM | POA: Diagnosis not present

## 2017-08-20 DIAGNOSIS — K551 Chronic vascular disorders of intestine: Secondary | ICD-10-CM | POA: Diagnosis not present

## 2017-08-20 DIAGNOSIS — I5033 Acute on chronic diastolic (congestive) heart failure: Secondary | ICD-10-CM | POA: Diagnosis not present

## 2017-08-20 DIAGNOSIS — J449 Chronic obstructive pulmonary disease, unspecified: Secondary | ICD-10-CM | POA: Diagnosis not present

## 2017-08-20 DIAGNOSIS — I951 Orthostatic hypotension: Secondary | ICD-10-CM | POA: Diagnosis not present

## 2017-08-20 DIAGNOSIS — D510 Vitamin B12 deficiency anemia due to intrinsic factor deficiency: Secondary | ICD-10-CM | POA: Diagnosis not present

## 2017-08-20 DIAGNOSIS — C50412 Malignant neoplasm of upper-outer quadrant of left female breast: Secondary | ICD-10-CM | POA: Diagnosis not present

## 2017-08-20 DIAGNOSIS — I214 Non-ST elevation (NSTEMI) myocardial infarction: Secondary | ICD-10-CM | POA: Diagnosis not present

## 2017-08-20 DIAGNOSIS — I4891 Unspecified atrial fibrillation: Secondary | ICD-10-CM | POA: Diagnosis not present

## 2017-08-20 DIAGNOSIS — E119 Type 2 diabetes mellitus without complications: Secondary | ICD-10-CM | POA: Diagnosis not present

## 2017-08-23 ENCOUNTER — Telehealth: Payer: Self-pay | Admitting: Family Medicine

## 2017-08-23 DIAGNOSIS — E119 Type 2 diabetes mellitus without complications: Secondary | ICD-10-CM | POA: Diagnosis not present

## 2017-08-23 DIAGNOSIS — I951 Orthostatic hypotension: Secondary | ICD-10-CM | POA: Diagnosis not present

## 2017-08-23 DIAGNOSIS — I5033 Acute on chronic diastolic (congestive) heart failure: Secondary | ICD-10-CM | POA: Diagnosis not present

## 2017-08-23 DIAGNOSIS — C50412 Malignant neoplasm of upper-outer quadrant of left female breast: Secondary | ICD-10-CM | POA: Diagnosis not present

## 2017-08-23 DIAGNOSIS — J449 Chronic obstructive pulmonary disease, unspecified: Secondary | ICD-10-CM | POA: Diagnosis not present

## 2017-08-23 DIAGNOSIS — D510 Vitamin B12 deficiency anemia due to intrinsic factor deficiency: Secondary | ICD-10-CM | POA: Diagnosis not present

## 2017-08-23 DIAGNOSIS — K265 Chronic or unspecified duodenal ulcer with perforation: Secondary | ICD-10-CM | POA: Diagnosis not present

## 2017-08-23 DIAGNOSIS — K551 Chronic vascular disorders of intestine: Secondary | ICD-10-CM | POA: Diagnosis not present

## 2017-08-23 DIAGNOSIS — I4891 Unspecified atrial fibrillation: Secondary | ICD-10-CM | POA: Diagnosis not present

## 2017-08-23 DIAGNOSIS — I214 Non-ST elevation (NSTEMI) myocardial infarction: Secondary | ICD-10-CM | POA: Diagnosis not present

## 2017-08-23 NOTE — Telephone Encounter (Signed)
Copied from [place] [phone]. Topic: Inquiry >> Aug 23, 2017  2:16 PM Lennox Solders wrote: Reason for CRM: lynn collins PT from [facility] would like verbal order for this pt to have physical therapy for twice this week and then re certify and then they will see her twice a wk for 2 wk and finish with once a wk for 2 wks. Fall prevention, gait training, strengthening and range of motion.

## 2017-08-23 NOTE — Telephone Encounter (Signed)
Pt ok  obs O2

## 2017-08-23 NOTE — Telephone Encounter (Signed)
Mia Padilla, [facility] calling to report that when patient walked about 20 ft her oxygen dropped to 80% and rose back up to over 90% after a one minute rest period.

## 2017-08-24 NOTE — Telephone Encounter (Signed)
Message relayed to [place].

## 2017-08-25 ENCOUNTER — Other Ambulatory Visit: Payer: Self-pay | Admitting: Family Medicine

## 2017-08-25 DIAGNOSIS — C50412 Malignant neoplasm of upper-outer quadrant of left female breast: Secondary | ICD-10-CM | POA: Diagnosis not present

## 2017-08-25 DIAGNOSIS — J449 Chronic obstructive pulmonary disease, unspecified: Secondary | ICD-10-CM | POA: Diagnosis not present

## 2017-08-25 DIAGNOSIS — I4891 Unspecified atrial fibrillation: Secondary | ICD-10-CM | POA: Diagnosis not present

## 2017-08-25 DIAGNOSIS — I951 Orthostatic hypotension: Secondary | ICD-10-CM | POA: Diagnosis not present

## 2017-08-25 DIAGNOSIS — E119 Type 2 diabetes mellitus without complications: Secondary | ICD-10-CM | POA: Diagnosis not present

## 2017-08-25 DIAGNOSIS — D510 Vitamin B12 deficiency anemia due to intrinsic factor deficiency: Secondary | ICD-10-CM | POA: Diagnosis not present

## 2017-08-25 DIAGNOSIS — K265 Chronic or unspecified duodenal ulcer with perforation: Secondary | ICD-10-CM | POA: Diagnosis not present

## 2017-08-25 DIAGNOSIS — I214 Non-ST elevation (NSTEMI) myocardial infarction: Secondary | ICD-10-CM | POA: Diagnosis not present

## 2017-08-25 DIAGNOSIS — K551 Chronic vascular disorders of intestine: Secondary | ICD-10-CM | POA: Diagnosis not present

## 2017-08-25 DIAGNOSIS — I5033 Acute on chronic diastolic (congestive) heart failure: Secondary | ICD-10-CM | POA: Diagnosis not present

## 2017-08-26 DIAGNOSIS — K265 Chronic or unspecified duodenal ulcer with perforation: Secondary | ICD-10-CM | POA: Diagnosis not present

## 2017-08-26 DIAGNOSIS — K551 Chronic vascular disorders of intestine: Secondary | ICD-10-CM | POA: Diagnosis not present

## 2017-08-26 DIAGNOSIS — C50412 Malignant neoplasm of upper-outer quadrant of left female breast: Secondary | ICD-10-CM | POA: Diagnosis not present

## 2017-08-26 DIAGNOSIS — I214 Non-ST elevation (NSTEMI) myocardial infarction: Secondary | ICD-10-CM | POA: Diagnosis not present

## 2017-08-26 DIAGNOSIS — I4891 Unspecified atrial fibrillation: Secondary | ICD-10-CM | POA: Diagnosis not present

## 2017-08-26 DIAGNOSIS — D510 Vitamin B12 deficiency anemia due to intrinsic factor deficiency: Secondary | ICD-10-CM | POA: Diagnosis not present

## 2017-08-26 DIAGNOSIS — J449 Chronic obstructive pulmonary disease, unspecified: Secondary | ICD-10-CM | POA: Diagnosis not present

## 2017-08-26 DIAGNOSIS — E119 Type 2 diabetes mellitus without complications: Secondary | ICD-10-CM | POA: Diagnosis not present

## 2017-08-26 DIAGNOSIS — I951 Orthostatic hypotension: Secondary | ICD-10-CM | POA: Diagnosis not present

## 2017-08-26 DIAGNOSIS — I5033 Acute on chronic diastolic (congestive) heart failure: Secondary | ICD-10-CM | POA: Diagnosis not present

## 2017-08-27 DIAGNOSIS — C50412 Malignant neoplasm of upper-outer quadrant of left female breast: Secondary | ICD-10-CM | POA: Diagnosis not present

## 2017-08-27 DIAGNOSIS — D510 Vitamin B12 deficiency anemia due to intrinsic factor deficiency: Secondary | ICD-10-CM | POA: Diagnosis not present

## 2017-08-27 DIAGNOSIS — K551 Chronic vascular disorders of intestine: Secondary | ICD-10-CM | POA: Diagnosis not present

## 2017-08-27 DIAGNOSIS — E119 Type 2 diabetes mellitus without complications: Secondary | ICD-10-CM | POA: Diagnosis not present

## 2017-08-27 DIAGNOSIS — K265 Chronic or unspecified duodenal ulcer with perforation: Secondary | ICD-10-CM | POA: Diagnosis not present

## 2017-08-27 DIAGNOSIS — I4891 Unspecified atrial fibrillation: Secondary | ICD-10-CM | POA: Diagnosis not present

## 2017-08-27 DIAGNOSIS — I5033 Acute on chronic diastolic (congestive) heart failure: Secondary | ICD-10-CM | POA: Diagnosis not present

## 2017-08-27 DIAGNOSIS — I951 Orthostatic hypotension: Secondary | ICD-10-CM | POA: Diagnosis not present

## 2017-08-27 DIAGNOSIS — I214 Non-ST elevation (NSTEMI) myocardial infarction: Secondary | ICD-10-CM | POA: Diagnosis not present

## 2017-08-27 DIAGNOSIS — J449 Chronic obstructive pulmonary disease, unspecified: Secondary | ICD-10-CM | POA: Diagnosis not present

## 2017-08-31 DIAGNOSIS — I214 Non-ST elevation (NSTEMI) myocardial infarction: Secondary | ICD-10-CM | POA: Diagnosis not present

## 2017-08-31 DIAGNOSIS — C50412 Malignant neoplasm of upper-outer quadrant of left female breast: Secondary | ICD-10-CM | POA: Diagnosis not present

## 2017-08-31 DIAGNOSIS — I4891 Unspecified atrial fibrillation: Secondary | ICD-10-CM | POA: Diagnosis not present

## 2017-08-31 DIAGNOSIS — D510 Vitamin B12 deficiency anemia due to intrinsic factor deficiency: Secondary | ICD-10-CM | POA: Diagnosis not present

## 2017-08-31 DIAGNOSIS — J449 Chronic obstructive pulmonary disease, unspecified: Secondary | ICD-10-CM | POA: Diagnosis not present

## 2017-08-31 DIAGNOSIS — I951 Orthostatic hypotension: Secondary | ICD-10-CM | POA: Diagnosis not present

## 2017-08-31 DIAGNOSIS — K265 Chronic or unspecified duodenal ulcer with perforation: Secondary | ICD-10-CM | POA: Diagnosis not present

## 2017-08-31 DIAGNOSIS — I5033 Acute on chronic diastolic (congestive) heart failure: Secondary | ICD-10-CM | POA: Diagnosis not present

## 2017-08-31 DIAGNOSIS — K551 Chronic vascular disorders of intestine: Secondary | ICD-10-CM | POA: Diagnosis not present

## 2017-08-31 DIAGNOSIS — E119 Type 2 diabetes mellitus without complications: Secondary | ICD-10-CM | POA: Diagnosis not present

## 2017-09-02 DIAGNOSIS — K551 Chronic vascular disorders of intestine: Secondary | ICD-10-CM | POA: Diagnosis not present

## 2017-09-02 DIAGNOSIS — I214 Non-ST elevation (NSTEMI) myocardial infarction: Secondary | ICD-10-CM | POA: Diagnosis not present

## 2017-09-02 DIAGNOSIS — J449 Chronic obstructive pulmonary disease, unspecified: Secondary | ICD-10-CM | POA: Diagnosis not present

## 2017-09-02 DIAGNOSIS — I4891 Unspecified atrial fibrillation: Secondary | ICD-10-CM | POA: Diagnosis not present

## 2017-09-02 DIAGNOSIS — C50412 Malignant neoplasm of upper-outer quadrant of left female breast: Secondary | ICD-10-CM | POA: Diagnosis not present

## 2017-09-02 DIAGNOSIS — E119 Type 2 diabetes mellitus without complications: Secondary | ICD-10-CM | POA: Diagnosis not present

## 2017-09-02 DIAGNOSIS — I5033 Acute on chronic diastolic (congestive) heart failure: Secondary | ICD-10-CM | POA: Diagnosis not present

## 2017-09-02 DIAGNOSIS — K265 Chronic or unspecified duodenal ulcer with perforation: Secondary | ICD-10-CM | POA: Diagnosis not present

## 2017-09-02 DIAGNOSIS — I951 Orthostatic hypotension: Secondary | ICD-10-CM | POA: Diagnosis not present

## 2017-09-02 DIAGNOSIS — D510 Vitamin B12 deficiency anemia due to intrinsic factor deficiency: Secondary | ICD-10-CM | POA: Diagnosis not present

## 2017-09-06 DIAGNOSIS — D510 Vitamin B12 deficiency anemia due to intrinsic factor deficiency: Secondary | ICD-10-CM | POA: Diagnosis not present

## 2017-09-06 DIAGNOSIS — J449 Chronic obstructive pulmonary disease, unspecified: Secondary | ICD-10-CM | POA: Diagnosis not present

## 2017-09-06 DIAGNOSIS — C50412 Malignant neoplasm of upper-outer quadrant of left female breast: Secondary | ICD-10-CM | POA: Diagnosis not present

## 2017-09-06 DIAGNOSIS — I951 Orthostatic hypotension: Secondary | ICD-10-CM | POA: Diagnosis not present

## 2017-09-06 DIAGNOSIS — I4891 Unspecified atrial fibrillation: Secondary | ICD-10-CM | POA: Diagnosis not present

## 2017-09-06 DIAGNOSIS — I214 Non-ST elevation (NSTEMI) myocardial infarction: Secondary | ICD-10-CM | POA: Diagnosis not present

## 2017-09-06 DIAGNOSIS — K265 Chronic or unspecified duodenal ulcer with perforation: Secondary | ICD-10-CM | POA: Diagnosis not present

## 2017-09-06 DIAGNOSIS — I5033 Acute on chronic diastolic (congestive) heart failure: Secondary | ICD-10-CM | POA: Diagnosis not present

## 2017-09-06 DIAGNOSIS — K551 Chronic vascular disorders of intestine: Secondary | ICD-10-CM | POA: Diagnosis not present

## 2017-09-06 DIAGNOSIS — E119 Type 2 diabetes mellitus without complications: Secondary | ICD-10-CM | POA: Diagnosis not present

## 2017-09-07 DIAGNOSIS — I951 Orthostatic hypotension: Secondary | ICD-10-CM | POA: Diagnosis not present

## 2017-09-07 DIAGNOSIS — C50412 Malignant neoplasm of upper-outer quadrant of left female breast: Secondary | ICD-10-CM | POA: Diagnosis not present

## 2017-09-07 DIAGNOSIS — I5033 Acute on chronic diastolic (congestive) heart failure: Secondary | ICD-10-CM | POA: Diagnosis not present

## 2017-09-07 DIAGNOSIS — I4891 Unspecified atrial fibrillation: Secondary | ICD-10-CM | POA: Diagnosis not present

## 2017-09-07 DIAGNOSIS — D510 Vitamin B12 deficiency anemia due to intrinsic factor deficiency: Secondary | ICD-10-CM | POA: Diagnosis not present

## 2017-09-07 DIAGNOSIS — K551 Chronic vascular disorders of intestine: Secondary | ICD-10-CM | POA: Diagnosis not present

## 2017-09-07 DIAGNOSIS — E119 Type 2 diabetes mellitus without complications: Secondary | ICD-10-CM | POA: Diagnosis not present

## 2017-09-07 DIAGNOSIS — I214 Non-ST elevation (NSTEMI) myocardial infarction: Secondary | ICD-10-CM | POA: Diagnosis not present

## 2017-09-07 DIAGNOSIS — J449 Chronic obstructive pulmonary disease, unspecified: Secondary | ICD-10-CM | POA: Diagnosis not present

## 2017-09-07 DIAGNOSIS — K265 Chronic or unspecified duodenal ulcer with perforation: Secondary | ICD-10-CM | POA: Diagnosis not present

## 2017-09-08 DIAGNOSIS — I5033 Acute on chronic diastolic (congestive) heart failure: Secondary | ICD-10-CM | POA: Diagnosis not present

## 2017-09-08 DIAGNOSIS — E119 Type 2 diabetes mellitus without complications: Secondary | ICD-10-CM | POA: Diagnosis not present

## 2017-09-08 DIAGNOSIS — I214 Non-ST elevation (NSTEMI) myocardial infarction: Secondary | ICD-10-CM | POA: Diagnosis not present

## 2017-09-08 DIAGNOSIS — K265 Chronic or unspecified duodenal ulcer with perforation: Secondary | ICD-10-CM | POA: Diagnosis not present

## 2017-09-08 DIAGNOSIS — D510 Vitamin B12 deficiency anemia due to intrinsic factor deficiency: Secondary | ICD-10-CM | POA: Diagnosis not present

## 2017-09-08 DIAGNOSIS — I951 Orthostatic hypotension: Secondary | ICD-10-CM | POA: Diagnosis not present

## 2017-09-08 DIAGNOSIS — K551 Chronic vascular disorders of intestine: Secondary | ICD-10-CM | POA: Diagnosis not present

## 2017-09-08 DIAGNOSIS — C50412 Malignant neoplasm of upper-outer quadrant of left female breast: Secondary | ICD-10-CM | POA: Diagnosis not present

## 2017-09-08 DIAGNOSIS — I4891 Unspecified atrial fibrillation: Secondary | ICD-10-CM | POA: Diagnosis not present

## 2017-09-08 DIAGNOSIS — J449 Chronic obstructive pulmonary disease, unspecified: Secondary | ICD-10-CM | POA: Diagnosis not present

## 2017-09-09 DIAGNOSIS — K551 Chronic vascular disorders of intestine: Secondary | ICD-10-CM | POA: Diagnosis not present

## 2017-09-09 DIAGNOSIS — I951 Orthostatic hypotension: Secondary | ICD-10-CM | POA: Diagnosis not present

## 2017-09-09 DIAGNOSIS — C50412 Malignant neoplasm of upper-outer quadrant of left female breast: Secondary | ICD-10-CM | POA: Diagnosis not present

## 2017-09-09 DIAGNOSIS — I4891 Unspecified atrial fibrillation: Secondary | ICD-10-CM | POA: Diagnosis not present

## 2017-09-09 DIAGNOSIS — K265 Chronic or unspecified duodenal ulcer with perforation: Secondary | ICD-10-CM | POA: Diagnosis not present

## 2017-09-09 DIAGNOSIS — D510 Vitamin B12 deficiency anemia due to intrinsic factor deficiency: Secondary | ICD-10-CM | POA: Diagnosis not present

## 2017-09-09 DIAGNOSIS — E119 Type 2 diabetes mellitus without complications: Secondary | ICD-10-CM | POA: Diagnosis not present

## 2017-09-09 DIAGNOSIS — I214 Non-ST elevation (NSTEMI) myocardial infarction: Secondary | ICD-10-CM | POA: Diagnosis not present

## 2017-09-09 DIAGNOSIS — J449 Chronic obstructive pulmonary disease, unspecified: Secondary | ICD-10-CM | POA: Diagnosis not present

## 2017-09-09 DIAGNOSIS — I5033 Acute on chronic diastolic (congestive) heart failure: Secondary | ICD-10-CM | POA: Diagnosis not present

## 2017-09-13 DIAGNOSIS — I4891 Unspecified atrial fibrillation: Secondary | ICD-10-CM | POA: Diagnosis not present

## 2017-09-13 DIAGNOSIS — E119 Type 2 diabetes mellitus without complications: Secondary | ICD-10-CM | POA: Diagnosis not present

## 2017-09-13 DIAGNOSIS — I214 Non-ST elevation (NSTEMI) myocardial infarction: Secondary | ICD-10-CM | POA: Diagnosis not present

## 2017-09-13 DIAGNOSIS — I951 Orthostatic hypotension: Secondary | ICD-10-CM | POA: Diagnosis not present

## 2017-09-13 DIAGNOSIS — K551 Chronic vascular disorders of intestine: Secondary | ICD-10-CM | POA: Diagnosis not present

## 2017-09-13 DIAGNOSIS — K265 Chronic or unspecified duodenal ulcer with perforation: Secondary | ICD-10-CM | POA: Diagnosis not present

## 2017-09-13 DIAGNOSIS — J449 Chronic obstructive pulmonary disease, unspecified: Secondary | ICD-10-CM | POA: Diagnosis not present

## 2017-09-13 DIAGNOSIS — I5033 Acute on chronic diastolic (congestive) heart failure: Secondary | ICD-10-CM | POA: Diagnosis not present

## 2017-09-13 DIAGNOSIS — D510 Vitamin B12 deficiency anemia due to intrinsic factor deficiency: Secondary | ICD-10-CM | POA: Diagnosis not present

## 2017-09-13 DIAGNOSIS — C50412 Malignant neoplasm of upper-outer quadrant of left female breast: Secondary | ICD-10-CM | POA: Diagnosis not present

## 2017-09-14 DIAGNOSIS — E119 Type 2 diabetes mellitus without complications: Secondary | ICD-10-CM | POA: Diagnosis not present

## 2017-09-14 DIAGNOSIS — D510 Vitamin B12 deficiency anemia due to intrinsic factor deficiency: Secondary | ICD-10-CM | POA: Diagnosis not present

## 2017-09-14 DIAGNOSIS — I4891 Unspecified atrial fibrillation: Secondary | ICD-10-CM | POA: Diagnosis not present

## 2017-09-14 DIAGNOSIS — I951 Orthostatic hypotension: Secondary | ICD-10-CM | POA: Diagnosis not present

## 2017-09-14 DIAGNOSIS — J449 Chronic obstructive pulmonary disease, unspecified: Secondary | ICD-10-CM | POA: Diagnosis not present

## 2017-09-14 DIAGNOSIS — C50412 Malignant neoplasm of upper-outer quadrant of left female breast: Secondary | ICD-10-CM | POA: Diagnosis not present

## 2017-09-14 DIAGNOSIS — K551 Chronic vascular disorders of intestine: Secondary | ICD-10-CM | POA: Diagnosis not present

## 2017-09-14 DIAGNOSIS — K265 Chronic or unspecified duodenal ulcer with perforation: Secondary | ICD-10-CM | POA: Diagnosis not present

## 2017-09-14 DIAGNOSIS — I214 Non-ST elevation (NSTEMI) myocardial infarction: Secondary | ICD-10-CM | POA: Diagnosis not present

## 2017-09-14 DIAGNOSIS — I5033 Acute on chronic diastolic (congestive) heart failure: Secondary | ICD-10-CM | POA: Diagnosis not present

## 2017-09-15 DIAGNOSIS — K265 Chronic or unspecified duodenal ulcer with perforation: Secondary | ICD-10-CM | POA: Diagnosis not present

## 2017-09-15 DIAGNOSIS — I214 Non-ST elevation (NSTEMI) myocardial infarction: Secondary | ICD-10-CM | POA: Diagnosis not present

## 2017-09-15 DIAGNOSIS — C50412 Malignant neoplasm of upper-outer quadrant of left female breast: Secondary | ICD-10-CM | POA: Diagnosis not present

## 2017-09-15 DIAGNOSIS — I5033 Acute on chronic diastolic (congestive) heart failure: Secondary | ICD-10-CM | POA: Diagnosis not present

## 2017-09-15 DIAGNOSIS — E119 Type 2 diabetes mellitus without complications: Secondary | ICD-10-CM | POA: Diagnosis not present

## 2017-09-15 DIAGNOSIS — K551 Chronic vascular disorders of intestine: Secondary | ICD-10-CM | POA: Diagnosis not present

## 2017-09-15 DIAGNOSIS — N2889 Other specified disorders of kidney and ureter: Secondary | ICD-10-CM | POA: Diagnosis not present

## 2017-09-15 DIAGNOSIS — J449 Chronic obstructive pulmonary disease, unspecified: Secondary | ICD-10-CM | POA: Diagnosis not present

## 2017-09-15 DIAGNOSIS — R600 Localized edema: Secondary | ICD-10-CM | POA: Diagnosis not present

## 2017-09-15 DIAGNOSIS — I951 Orthostatic hypotension: Secondary | ICD-10-CM | POA: Diagnosis not present

## 2017-09-15 DIAGNOSIS — I4891 Unspecified atrial fibrillation: Secondary | ICD-10-CM | POA: Diagnosis not present

## 2017-09-15 DIAGNOSIS — D510 Vitamin B12 deficiency anemia due to intrinsic factor deficiency: Secondary | ICD-10-CM | POA: Diagnosis not present

## 2017-09-15 DIAGNOSIS — N183 Chronic kidney disease, stage 3 (moderate): Secondary | ICD-10-CM | POA: Diagnosis not present

## 2017-09-17 DIAGNOSIS — E119 Type 2 diabetes mellitus without complications: Secondary | ICD-10-CM | POA: Diagnosis not present

## 2017-09-17 DIAGNOSIS — I4891 Unspecified atrial fibrillation: Secondary | ICD-10-CM | POA: Diagnosis not present

## 2017-09-17 DIAGNOSIS — I951 Orthostatic hypotension: Secondary | ICD-10-CM | POA: Diagnosis not present

## 2017-09-17 DIAGNOSIS — I5033 Acute on chronic diastolic (congestive) heart failure: Secondary | ICD-10-CM | POA: Diagnosis not present

## 2017-09-17 DIAGNOSIS — C50412 Malignant neoplasm of upper-outer quadrant of left female breast: Secondary | ICD-10-CM | POA: Diagnosis not present

## 2017-09-17 DIAGNOSIS — K551 Chronic vascular disorders of intestine: Secondary | ICD-10-CM | POA: Diagnosis not present

## 2017-09-17 DIAGNOSIS — D510 Vitamin B12 deficiency anemia due to intrinsic factor deficiency: Secondary | ICD-10-CM | POA: Diagnosis not present

## 2017-09-17 DIAGNOSIS — K265 Chronic or unspecified duodenal ulcer with perforation: Secondary | ICD-10-CM | POA: Diagnosis not present

## 2017-09-17 DIAGNOSIS — I214 Non-ST elevation (NSTEMI) myocardial infarction: Secondary | ICD-10-CM | POA: Diagnosis not present

## 2017-09-17 DIAGNOSIS — J449 Chronic obstructive pulmonary disease, unspecified: Secondary | ICD-10-CM | POA: Diagnosis not present

## 2017-09-20 ENCOUNTER — Telehealth: Payer: Self-pay | Admitting: Family Medicine

## 2017-09-20 DIAGNOSIS — C50412 Malignant neoplasm of upper-outer quadrant of left female breast: Secondary | ICD-10-CM | POA: Diagnosis not present

## 2017-09-20 DIAGNOSIS — J449 Chronic obstructive pulmonary disease, unspecified: Secondary | ICD-10-CM | POA: Diagnosis not present

## 2017-09-20 DIAGNOSIS — K265 Chronic or unspecified duodenal ulcer with perforation: Secondary | ICD-10-CM | POA: Diagnosis not present

## 2017-09-20 DIAGNOSIS — D510 Vitamin B12 deficiency anemia due to intrinsic factor deficiency: Secondary | ICD-10-CM | POA: Diagnosis not present

## 2017-09-20 DIAGNOSIS — E119 Type 2 diabetes mellitus without complications: Secondary | ICD-10-CM | POA: Diagnosis not present

## 2017-09-20 DIAGNOSIS — I214 Non-ST elevation (NSTEMI) myocardial infarction: Secondary | ICD-10-CM | POA: Diagnosis not present

## 2017-09-20 DIAGNOSIS — I4891 Unspecified atrial fibrillation: Secondary | ICD-10-CM | POA: Diagnosis not present

## 2017-09-20 DIAGNOSIS — K551 Chronic vascular disorders of intestine: Secondary | ICD-10-CM | POA: Diagnosis not present

## 2017-09-20 DIAGNOSIS — I5033 Acute on chronic diastolic (congestive) heart failure: Secondary | ICD-10-CM | POA: Diagnosis not present

## 2017-09-20 DIAGNOSIS — I951 Orthostatic hypotension: Secondary | ICD-10-CM | POA: Diagnosis not present

## 2017-09-20 NOTE — Telephone Encounter (Signed)
Copied from [place] [id number]. Topic: Quick Communication - See Telephone Encounter >> Sep 20, 2017  4:15 PM Bea Graff, NT wrote: CRM for notification. See Telephone encounter for: 09/20/17. Elder Cyphers with [facility] states that today when pt was ambulating her O2 stat dropped to 79. When she returned to sitting pts O2 stat went back up to 99. CB#: [phone]

## 2017-09-22 ENCOUNTER — Other Ambulatory Visit: Payer: Self-pay | Admitting: Family Medicine

## 2017-09-22 DIAGNOSIS — E119 Type 2 diabetes mellitus without complications: Secondary | ICD-10-CM | POA: Diagnosis not present

## 2017-09-22 DIAGNOSIS — I4891 Unspecified atrial fibrillation: Secondary | ICD-10-CM | POA: Diagnosis not present

## 2017-09-22 DIAGNOSIS — K551 Chronic vascular disorders of intestine: Secondary | ICD-10-CM | POA: Diagnosis not present

## 2017-09-22 DIAGNOSIS — I214 Non-ST elevation (NSTEMI) myocardial infarction: Secondary | ICD-10-CM | POA: Diagnosis not present

## 2017-09-22 DIAGNOSIS — I5033 Acute on chronic diastolic (congestive) heart failure: Secondary | ICD-10-CM | POA: Diagnosis not present

## 2017-09-22 DIAGNOSIS — K265 Chronic or unspecified duodenal ulcer with perforation: Secondary | ICD-10-CM | POA: Diagnosis not present

## 2017-09-22 DIAGNOSIS — J449 Chronic obstructive pulmonary disease, unspecified: Secondary | ICD-10-CM | POA: Diagnosis not present

## 2017-09-22 DIAGNOSIS — I951 Orthostatic hypotension: Secondary | ICD-10-CM | POA: Diagnosis not present

## 2017-09-22 DIAGNOSIS — D510 Vitamin B12 deficiency anemia due to intrinsic factor deficiency: Secondary | ICD-10-CM | POA: Diagnosis not present

## 2017-09-22 DIAGNOSIS — C50412 Malignant neoplasm of upper-outer quadrant of left female breast: Secondary | ICD-10-CM | POA: Diagnosis not present

## 2017-09-22 NOTE — Telephone Encounter (Signed)
Copied from [place] [id number]. Topic: Quick Communication - Rx Refill/Question >> Sep 22, 2017  1:39 PM Waylan Rocher, Lumin L wrote: Medication: B12 inj  Has the patient contacted their pharmacy? Yes.   (Agent: If no, request that the patient contact the pharmacy for the refill.) (Agent: If yes, when and what did the pharmacy advise?)  Preferred Pharmacy (with phone number or street name): [facility] [address] [place] [address] Phone: [phone] Fax: [phone]  Agent: Please be advised that RX refills may take up to 3 business days. We ask that you follow-up with your pharmacy.  Patient says she's overdue for B12. Her nurse Mardene Celeste with [facility] injects them for her once a month and is supposed to be coming to her home this Friday or Saturday morning. Please call patient once filled.

## 2017-09-23 ENCOUNTER — Other Ambulatory Visit: Payer: Self-pay | Admitting: Family Medicine

## 2017-09-23 DIAGNOSIS — K265 Chronic or unspecified duodenal ulcer with perforation: Secondary | ICD-10-CM | POA: Diagnosis not present

## 2017-09-23 DIAGNOSIS — D510 Vitamin B12 deficiency anemia due to intrinsic factor deficiency: Secondary | ICD-10-CM | POA: Diagnosis not present

## 2017-09-23 DIAGNOSIS — I214 Non-ST elevation (NSTEMI) myocardial infarction: Secondary | ICD-10-CM | POA: Diagnosis not present

## 2017-09-23 DIAGNOSIS — I5033 Acute on chronic diastolic (congestive) heart failure: Secondary | ICD-10-CM | POA: Diagnosis not present

## 2017-09-23 DIAGNOSIS — E119 Type 2 diabetes mellitus without complications: Secondary | ICD-10-CM | POA: Diagnosis not present

## 2017-09-23 DIAGNOSIS — K551 Chronic vascular disorders of intestine: Secondary | ICD-10-CM | POA: Diagnosis not present

## 2017-09-23 DIAGNOSIS — C50412 Malignant neoplasm of upper-outer quadrant of left female breast: Secondary | ICD-10-CM | POA: Diagnosis not present

## 2017-09-23 DIAGNOSIS — I951 Orthostatic hypotension: Secondary | ICD-10-CM | POA: Diagnosis not present

## 2017-09-23 DIAGNOSIS — I4891 Unspecified atrial fibrillation: Secondary | ICD-10-CM | POA: Diagnosis not present

## 2017-09-23 DIAGNOSIS — J449 Chronic obstructive pulmonary disease, unspecified: Secondary | ICD-10-CM | POA: Diagnosis not present

## 2017-09-23 NOTE — Telephone Encounter (Signed)
mis routed Rx- sorry.

## 2017-09-23 NOTE — Telephone Encounter (Signed)
Not a patient here. 

## 2017-09-23 NOTE — Telephone Encounter (Signed)
Rx request for historical medication: Vitamin B-12 injection          Last filled: 09/08/17  LOV: 08/10/17  PCP: [facility]: verified

## 2017-09-24 ENCOUNTER — Other Ambulatory Visit: Payer: Self-pay | Admitting: Family Medicine

## 2017-09-24 NOTE — Telephone Encounter (Signed)
B12 1000 mcg/ml injection refill request  LOV 08/10/17 with Dr. Sharmaine Base [address], [place]

## 2017-09-24 NOTE — Telephone Encounter (Signed)
Your patient 

## 2017-09-24 NOTE — Telephone Encounter (Signed)
PT called to check  On status of b12   She also  States she wants 3 bottles instead of one if possible

## 2017-09-27 DIAGNOSIS — C50412 Malignant neoplasm of upper-outer quadrant of left female breast: Secondary | ICD-10-CM | POA: Diagnosis not present

## 2017-09-27 DIAGNOSIS — K265 Chronic or unspecified duodenal ulcer with perforation: Secondary | ICD-10-CM | POA: Diagnosis not present

## 2017-09-27 DIAGNOSIS — E119 Type 2 diabetes mellitus without complications: Secondary | ICD-10-CM | POA: Diagnosis not present

## 2017-09-27 DIAGNOSIS — I5033 Acute on chronic diastolic (congestive) heart failure: Secondary | ICD-10-CM | POA: Diagnosis not present

## 2017-09-27 DIAGNOSIS — J449 Chronic obstructive pulmonary disease, unspecified: Secondary | ICD-10-CM | POA: Diagnosis not present

## 2017-09-27 DIAGNOSIS — I951 Orthostatic hypotension: Secondary | ICD-10-CM | POA: Diagnosis not present

## 2017-09-27 DIAGNOSIS — K551 Chronic vascular disorders of intestine: Secondary | ICD-10-CM | POA: Diagnosis not present

## 2017-09-27 DIAGNOSIS — D510 Vitamin B12 deficiency anemia due to intrinsic factor deficiency: Secondary | ICD-10-CM | POA: Diagnosis not present

## 2017-09-27 DIAGNOSIS — I214 Non-ST elevation (NSTEMI) myocardial infarction: Secondary | ICD-10-CM | POA: Diagnosis not present

## 2017-09-27 DIAGNOSIS — I4891 Unspecified atrial fibrillation: Secondary | ICD-10-CM | POA: Diagnosis not present

## 2017-09-27 MED ORDER — CYANOCOBALAMIN 1000 MCG/ML IJ SOLN: 1000.0000 ug | INTRAMUSCULAR | 12 refills | Status: AC

## 2017-09-27 NOTE — Telephone Encounter (Signed)
Your patient 

## 2017-09-27 NOTE — Telephone Encounter (Signed)
Pt calling about her B12 prescription she called it in on the 5th

## 2017-09-29 DIAGNOSIS — I5033 Acute on chronic diastolic (congestive) heart failure: Secondary | ICD-10-CM | POA: Diagnosis not present

## 2017-09-29 DIAGNOSIS — J449 Chronic obstructive pulmonary disease, unspecified: Secondary | ICD-10-CM | POA: Diagnosis not present

## 2017-09-29 DIAGNOSIS — K551 Chronic vascular disorders of intestine: Secondary | ICD-10-CM | POA: Diagnosis not present

## 2017-09-29 DIAGNOSIS — I214 Non-ST elevation (NSTEMI) myocardial infarction: Secondary | ICD-10-CM | POA: Diagnosis not present

## 2017-09-29 DIAGNOSIS — I4891 Unspecified atrial fibrillation: Secondary | ICD-10-CM | POA: Diagnosis not present

## 2017-09-29 DIAGNOSIS — I951 Orthostatic hypotension: Secondary | ICD-10-CM | POA: Diagnosis not present

## 2017-09-29 DIAGNOSIS — E119 Type 2 diabetes mellitus without complications: Secondary | ICD-10-CM | POA: Diagnosis not present

## 2017-09-29 DIAGNOSIS — C50412 Malignant neoplasm of upper-outer quadrant of left female breast: Secondary | ICD-10-CM | POA: Diagnosis not present

## 2017-09-29 DIAGNOSIS — D510 Vitamin B12 deficiency anemia due to intrinsic factor deficiency: Secondary | ICD-10-CM | POA: Diagnosis not present

## 2017-09-29 DIAGNOSIS — K265 Chronic or unspecified duodenal ulcer with perforation: Secondary | ICD-10-CM | POA: Diagnosis not present

## 2017-09-30 ENCOUNTER — Other Ambulatory Visit: Payer: Self-pay

## 2017-09-30 DIAGNOSIS — I5033 Acute on chronic diastolic (congestive) heart failure: Secondary | ICD-10-CM | POA: Diagnosis not present

## 2017-09-30 DIAGNOSIS — K265 Chronic or unspecified duodenal ulcer with perforation: Secondary | ICD-10-CM | POA: Diagnosis not present

## 2017-09-30 DIAGNOSIS — J449 Chronic obstructive pulmonary disease, unspecified: Secondary | ICD-10-CM | POA: Diagnosis not present

## 2017-09-30 DIAGNOSIS — D510 Vitamin B12 deficiency anemia due to intrinsic factor deficiency: Secondary | ICD-10-CM | POA: Diagnosis not present

## 2017-09-30 DIAGNOSIS — I4891 Unspecified atrial fibrillation: Secondary | ICD-10-CM | POA: Diagnosis not present

## 2017-09-30 DIAGNOSIS — I951 Orthostatic hypotension: Secondary | ICD-10-CM | POA: Diagnosis not present

## 2017-09-30 DIAGNOSIS — E119 Type 2 diabetes mellitus without complications: Secondary | ICD-10-CM | POA: Diagnosis not present

## 2017-09-30 DIAGNOSIS — I214 Non-ST elevation (NSTEMI) myocardial infarction: Secondary | ICD-10-CM | POA: Diagnosis not present

## 2017-09-30 DIAGNOSIS — C50412 Malignant neoplasm of upper-outer quadrant of left female breast: Secondary | ICD-10-CM | POA: Diagnosis not present

## 2017-09-30 DIAGNOSIS — K551 Chronic vascular disorders of intestine: Secondary | ICD-10-CM | POA: Diagnosis not present

## 2017-09-30 NOTE — Telephone Encounter (Signed)
Fax from pharmacy for refill on Vit B12. Medication already refilled 09/27/17.

## 2017-10-01 ENCOUNTER — Inpatient Hospital Stay
Admission: EM | Admit: 2017-10-01 | Discharge: 2017-10-18 | DRG: 871 | Disposition: E | Payer: Medicare Other | Attending: Specialist | Admitting: Specialist

## 2017-10-01 ENCOUNTER — Emergency Department: Payer: Medicare Other

## 2017-10-01 ENCOUNTER — Other Ambulatory Visit: Payer: Self-pay

## 2017-10-01 ENCOUNTER — Encounter: Payer: Self-pay | Admitting: Emergency Medicine

## 2017-10-01 DIAGNOSIS — E039 Hypothyroidism, unspecified: Secondary | ICD-10-CM | POA: Diagnosis present

## 2017-10-01 DIAGNOSIS — Z853 Personal history of malignant neoplasm of breast: Secondary | ICD-10-CM | POA: Diagnosis not present

## 2017-10-01 DIAGNOSIS — J449 Chronic obstructive pulmonary disease, unspecified: Secondary | ICD-10-CM | POA: Diagnosis not present

## 2017-10-01 DIAGNOSIS — K631 Perforation of intestine (nontraumatic): Secondary | ICD-10-CM | POA: Diagnosis present

## 2017-10-01 DIAGNOSIS — E118 Type 2 diabetes mellitus with unspecified complications: Secondary | ICD-10-CM | POA: Diagnosis present

## 2017-10-01 DIAGNOSIS — R569 Unspecified convulsions: Secondary | ICD-10-CM | POA: Diagnosis present

## 2017-10-01 DIAGNOSIS — K56609 Unspecified intestinal obstruction, unspecified as to partial versus complete obstruction: Secondary | ICD-10-CM | POA: Diagnosis present

## 2017-10-01 DIAGNOSIS — E46 Unspecified protein-calorie malnutrition: Secondary | ICD-10-CM | POA: Diagnosis not present

## 2017-10-01 DIAGNOSIS — Z4682 Encounter for fitting and adjustment of non-vascular catheter: Secondary | ICD-10-CM | POA: Diagnosis not present

## 2017-10-01 DIAGNOSIS — M19042 Primary osteoarthritis, left hand: Secondary | ICD-10-CM | POA: Diagnosis not present

## 2017-10-01 DIAGNOSIS — R1111 Vomiting without nausea: Secondary | ICD-10-CM | POA: Diagnosis not present

## 2017-10-01 DIAGNOSIS — R6521 Severe sepsis with septic shock: Secondary | ICD-10-CM | POA: Diagnosis not present

## 2017-10-01 DIAGNOSIS — Z8249 Family history of ischemic heart disease and other diseases of the circulatory system: Secondary | ICD-10-CM

## 2017-10-01 DIAGNOSIS — Z79899 Other long term (current) drug therapy: Secondary | ICD-10-CM | POA: Diagnosis not present

## 2017-10-01 DIAGNOSIS — Z885 Allergy status to narcotic agent status: Secondary | ICD-10-CM

## 2017-10-01 DIAGNOSIS — K668 Other specified disorders of peritoneum: Secondary | ICD-10-CM | POA: Diagnosis not present

## 2017-10-01 DIAGNOSIS — Z8711 Personal history of peptic ulcer disease: Secondary | ICD-10-CM

## 2017-10-01 DIAGNOSIS — M19041 Primary osteoarthritis, right hand: Secondary | ICD-10-CM | POA: Diagnosis present

## 2017-10-01 DIAGNOSIS — Z7901 Long term (current) use of anticoagulants: Secondary | ICD-10-CM | POA: Diagnosis not present

## 2017-10-01 DIAGNOSIS — Z79890 Hormone replacement therapy (postmenopausal): Secondary | ICD-10-CM | POA: Diagnosis not present

## 2017-10-01 DIAGNOSIS — I10 Essential (primary) hypertension: Secondary | ICD-10-CM | POA: Diagnosis present

## 2017-10-01 DIAGNOSIS — Z9012 Acquired absence of left breast and nipple: Secondary | ICD-10-CM | POA: Diagnosis not present

## 2017-10-01 DIAGNOSIS — Z66 Do not resuscitate: Secondary | ICD-10-CM | POA: Diagnosis not present

## 2017-10-01 DIAGNOSIS — N179 Acute kidney failure, unspecified: Secondary | ICD-10-CM | POA: Diagnosis not present

## 2017-10-01 DIAGNOSIS — I110 Hypertensive heart disease with heart failure: Secondary | ICD-10-CM | POA: Diagnosis present

## 2017-10-01 DIAGNOSIS — A419 Sepsis, unspecified organism: Principal | ICD-10-CM | POA: Diagnosis present

## 2017-10-01 DIAGNOSIS — Z87891 Personal history of nicotine dependence: Secondary | ICD-10-CM | POA: Diagnosis not present

## 2017-10-01 DIAGNOSIS — I480 Paroxysmal atrial fibrillation: Secondary | ICD-10-CM | POA: Diagnosis present

## 2017-10-01 DIAGNOSIS — H409 Unspecified glaucoma: Secondary | ICD-10-CM | POA: Diagnosis present

## 2017-10-01 DIAGNOSIS — R197 Diarrhea, unspecified: Secondary | ICD-10-CM | POA: Diagnosis not present

## 2017-10-01 DIAGNOSIS — N200 Calculus of kidney: Secondary | ICD-10-CM | POA: Diagnosis not present

## 2017-10-01 DIAGNOSIS — K56691 Other complete intestinal obstruction: Secondary | ICD-10-CM | POA: Diagnosis not present

## 2017-10-01 DIAGNOSIS — E669 Obesity, unspecified: Secondary | ICD-10-CM | POA: Diagnosis present

## 2017-10-01 DIAGNOSIS — Z886 Allergy status to analgesic agent status: Secondary | ICD-10-CM | POA: Diagnosis not present

## 2017-10-01 DIAGNOSIS — I5042 Chronic combined systolic (congestive) and diastolic (congestive) heart failure: Secondary | ICD-10-CM | POA: Diagnosis present

## 2017-10-01 DIAGNOSIS — Z7951 Long term (current) use of inhaled steroids: Secondary | ICD-10-CM

## 2017-10-01 DIAGNOSIS — Z6831 Body mass index (BMI) 31.0-31.9, adult: Secondary | ICD-10-CM

## 2017-10-01 DIAGNOSIS — R1013 Epigastric pain: Secondary | ICD-10-CM | POA: Diagnosis not present

## 2017-10-01 DIAGNOSIS — I5032 Chronic diastolic (congestive) heart failure: Secondary | ICD-10-CM | POA: Diagnosis present

## 2017-10-01 DIAGNOSIS — I2510 Atherosclerotic heart disease of native coronary artery without angina pectoris: Secondary | ICD-10-CM | POA: Diagnosis present

## 2017-10-01 DIAGNOSIS — Z515 Encounter for palliative care: Secondary | ICD-10-CM | POA: Diagnosis present

## 2017-10-01 DIAGNOSIS — R112 Nausea with vomiting, unspecified: Secondary | ICD-10-CM | POA: Diagnosis not present

## 2017-10-01 DIAGNOSIS — E785 Hyperlipidemia, unspecified: Secondary | ICD-10-CM | POA: Diagnosis present

## 2017-10-01 DIAGNOSIS — Z794 Long term (current) use of insulin: Secondary | ICD-10-CM

## 2017-10-01 DIAGNOSIS — Z833 Family history of diabetes mellitus: Secondary | ICD-10-CM

## 2017-10-01 DIAGNOSIS — R6520 Severe sepsis without septic shock: Secondary | ICD-10-CM

## 2017-10-01 DIAGNOSIS — E1151 Type 2 diabetes mellitus with diabetic peripheral angiopathy without gangrene: Secondary | ICD-10-CM | POA: Diagnosis present

## 2017-10-01 DIAGNOSIS — Z90711 Acquired absence of uterus with remaining cervical stump: Secondary | ICD-10-CM

## 2017-10-01 LAB — COMPREHENSIVE METABOLIC PANEL
ALBUMIN: 4.7 g/dL (ref 3.5–5.0)
ALT: 16 U/L (ref 14–54)
ANION GAP: 17 — AB (ref 5–15)
AST: 31 U/L (ref 15–41)
Alkaline Phosphatase: 61 U/L (ref 38–126)
BILIRUBIN TOTAL: 1.4 mg/dL — AB (ref 0.3–1.2)
BUN: 41 mg/dL — ABNORMAL HIGH (ref 6–20)
CO2: 28 mmol/L (ref 22–32)
Calcium: 9.9 mg/dL (ref 8.9–10.3)
Chloride: 93 mmol/L — ABNORMAL LOW (ref 101–111)
Creatinine, Ser: 1.88 mg/dL — ABNORMAL HIGH (ref 0.44–1.00)
GFR calc Af Amer: 28 mL/min — ABNORMAL LOW (ref >60)
GFR calc non Af Amer: 24 mL/min — ABNORMAL LOW (ref >60)
GLUCOSE: 154 mg/dL — AB (ref 65–99)
POTASSIUM: 3.6 mmol/L (ref 3.5–5.1)
Sodium: 138 mmol/L (ref 135–145)
TOTAL PROTEIN: 10.7 g/dL — AB (ref 6.5–8.1)

## 2017-10-01 LAB — CBC WITH DIFFERENTIAL/PLATELET
BASOS ABS: 0 10*3/uL (ref 0–0.1)
BASOS PCT: 0 %
Eosinophils Absolute: 0 10*3/uL (ref 0–0.7)
Eosinophils Relative: 0 %
HEMATOCRIT: 40.7 % (ref 35.0–47.0)
HEMOGLOBIN: 13.5 g/dL (ref 12.0–16.0)
Lymphocytes Relative: 12 %
Lymphs Abs: 0.4 10*3/uL — ABNORMAL LOW (ref 1.0–3.6)
MCH: 34.1 pg — ABNORMAL HIGH (ref 26.0–34.0)
MCHC: 33.3 g/dL (ref 32.0–36.0)
MCV: 102.6 fL — ABNORMAL HIGH (ref 80.0–100.0)
MONOS PCT: 5 %
Monocytes Absolute: 0.2 10*3/uL (ref 0.2–0.9)
NEUTROS ABS: 2.7 10*3/uL (ref 1.4–6.5)
NEUTROS PCT: 83 %
Platelets: 185 10*3/uL (ref 150–440)
RBC: 3.97 MIL/uL (ref 3.80–5.20)
RDW: 16.9 % — ABNORMAL HIGH (ref 11.5–14.5)
WBC: 3.2 10*3/uL — ABNORMAL LOW (ref 3.6–11.0)

## 2017-10-01 LAB — LACTIC ACID, PLASMA: LACTIC ACID, VENOUS: 1.1 mmol/L (ref 0.5–1.9)

## 2017-10-01 LAB — TROPONIN I: TROPONIN I: 0.04 ng/mL — AB (ref <0.03)

## 2017-10-01 MED ORDER — SODIUM CHLORIDE 0.9 % IV BOLUS
1000.0000 mL | Freq: Once | INTRAVENOUS | Status: AC
  Administered 2017-10-01: 1000 mL via INTRAVENOUS

## 2017-10-01 MED ORDER — ONDANSETRON HCL 4 MG/2ML IJ SOLN
4.0000 mg | Freq: Once | INTRAMUSCULAR | Status: AC
  Administered 2017-10-01: 4 mg via INTRAVENOUS
  Filled 2017-10-01: qty 2

## 2017-10-01 MED ORDER — SODIUM CHLORIDE 0.9 % IV SOLN
2.0000 g | Freq: Once | INTRAVENOUS | Status: AC
  Administered 2017-10-01: 2 g via INTRAVENOUS
  Filled 2017-10-01: qty 2

## 2017-10-01 MED ORDER — VANCOMYCIN HCL IN DEXTROSE 1-5 GM/200ML-% IV SOLN
1000.0000 mg | Freq: Once | INTRAVENOUS | Status: AC
  Administered 2017-10-01: 1000 mg via INTRAVENOUS
  Filled 2017-10-01: qty 200

## 2017-10-01 MED ORDER — FENTANYL CITRATE (PF) 100 MCG/2ML IJ SOLN
50.0000 ug | INTRAMUSCULAR | Status: DC | PRN
  Administered 2017-10-01: 50 ug via INTRAVENOUS
  Filled 2017-10-01: qty 2

## 2017-10-01 MED ORDER — METOCLOPRAMIDE HCL 5 MG/ML IJ SOLN
10.0000 mg | Freq: Once | INTRAMUSCULAR | Status: AC
  Administered 2017-10-01: 10 mg via INTRAVENOUS
  Filled 2017-10-01: qty 2

## 2017-10-01 NOTE — ED Provider Notes (Addendum)
[facility] Emergency Department Provider Note    First MD Initiated Contact with Patient 09/25/2017 1758     (approximate)  I have reviewed the triage vital signs and the nursing notes.   HISTORY  Chief Complaint Emesis    HPI Mia Padilla is a 83 y.o. female stick set of past medical history presents to the ER from home with chief complaint of vomiting this morning as well as watery diarrhea started last night.  States that she is been able to keep anything down throughout the day.  Denies any focal pain.  No blood in her stools and no melena.  Denies any chest pain or worsening shortness of breath.  States that she has had episodes with her stomach like this in the past.  States she does have a history of congestive heart failure.  Still is passing gas.  No measured fevers but has felt chills.  Patient found by EMS to be hypotensive in route therefore IV fluids was started.    Past Medical History:  Diagnosis Date  . Arthritis    hands  . Black head   . Bowel incontinence   . Breast cancer ([place]) 11/15/2013  . Breast cancer of upper-outer quadrant of left female breast ([place]) 2015   Left  . Carotid artery stenosis    a. ultrasound 10/2092: 7-09 LICA stenosis, 62-83% RICA stenosis; b. followed by vascular  . Carotid stenosis 11/17/2016  . Carpal tunnel syndrome   . Chronic combined systolic and diastolic CHF, NYHA class 3 ([place])    a. TTE 10/18: EF 40%, diffuse HK, DD, calcifief mitral annulus with mild MR, mildly dilated left atrium, normal RV cavity size and systolic function, PASP 66-29 mmHg  . COPD (chronic obstructive pulmonary disease) ([place])   . Debilitated   . Diabetes mellitus with complication ([place])   . Glaucoma   . History of seizures   . History of stress test    a. 11/18: small defect of mild severity present in the apex location felt to be 2/2 breast attenuation. Normal study  . Hyperlipidemia   . Hypertension   . Hypomagnesemia   .  Hypotension   . Hypothyroidism 02/07/2015  . Mesenteric artery stenosis ([place])   . Obese 06/04/2011  . Obesity   . PAF (paroxysmal atrial fibrillation) (H[facility])    a. on Eliquis; b. CHADS2VASc 8 (CHF, HTN, age x 2, DM, vascular disease, sex category)  . Perforated duodenal ulcer ([place]) 01/2017   a. conservatively managed  . Peripheral vascular disease ([place]) 04/23/2015  . SMA stenosis ([place])    a. noted 10/18; b. conservatively managed; c. unclear if thrombus vs plaque; d. on Eliquis given PAF and Lipitor  . Urinary incontinence    Family History  Problem Relation Age of Onset  . Cervical cancer Mother   . Diabetes Mother   . Glaucoma Mother   . Cervical cancer Daughter   . Hypertension Daughter   . Hypertension Father   . Hypertension Sister   . Hypertension Brother   . Hypertension Son   . Diabetes Brother   . HIV Brother   . Breast cancer Neg Hx    Past Surgical History:  Procedure Laterality Date  . BOWEL RESECTION  2005  . BREAST SURGERY  08/03/13   left mastectomy  . CARPAL TUNNEL RELEASE Right 30 years   . MASTECTOMY     left   . PARTIAL HYSTERECTOMY    . TONSILLECTOMY    .  UMBILICAL HERNIA REPAIR     Patient Active Problem List   Diagnosis Date Noted  . Chronic diastolic heart failure ([place]) 08/19/2017  . At high risk for falls 08/10/2017  . Primary open angle glaucoma of both eyes, severe stage 04/22/2017  . PAF (paroxysmal atrial fibrillation) ([place])   . Diabetes mellitus with complication ([place])   . Hypomagnesemia   . Hypokalemia   . Mesenteric artery stenosis ([place])   . Perforated duodenal ulcer ([place]) 01/18/2017  . Advanced care planning/counseling discussion 11/19/2016  . Carotid stenosis 11/17/2016  . Lymphedema 02/14/2016  . Vitamin D deficiency 11/12/2015  . B12 deficiency 11/11/2015  . Peripheral vascular disease ([place]) 04/23/2015  . Osteoarthritis 04/23/2015  . Hypothyroidism 02/07/2015  . Urinary incontinence 02/07/2015  . Malignant neoplasm of  upper-outer quadrant of female breast ([place]) 06/28/2013  . Hypertension 10/15/2011  . COPD (chronic obstructive pulmonary disease) ([place]) 06/04/2011  . Seizures ([place]) 06/04/2011      Prior to Admission medications   Medication Sig Start Date End Date Taking? Authorizing Provider  acetaminophen (TYLENOL) 650 MG CR tablet Take 650 mg by mouth every 8 (eight) hours as needed for pain.   Yes [provider]  amLODipine-olmesartan (AZOR) 5-40 MG tablet Take 0.5 tablets by mouth daily. 07/13/17  Yes Minna Merritts, MD  anastrozole (ARIMIDEX) 1 MG tablet TAKE 1 TABLET BY MOUTH ONCE DAILY 06/16/17  Yes Byrnett, Forest Gleason, MD  apixaban (ELIQUIS) 5 MG TABS tablet Take 5 mg by mouth 2 (two) times daily.   Yes [provider]  atorvastatin (LIPITOR) 20 MG tablet TAKE 1 TABLET BY MOUTH ONCE DAILY AT  6PM 09/23/17  Yes Crissman, Mark A, MD  brimonidine (ALPHAGAN P) 0.1 % SOLN 2 (two) times daily.    Yes [provider]  cyanocobalamin (,VITAMIN B-12,) 1000 MCG/ML injection Inject 1 mL (1,000 mcg total) into the muscle every 30 (thirty) days. 09/27/17  Yes Crissman, Jeannette How, MD  dorzolamide (TRUSOPT) 2 % ophthalmic solution 1 drop 2 (two) times daily.    Yes [provider]  fluticasone (FLONASE) 50 MCG/ACT nasal spray Place 2 sprays into both nostrils daily. 10/14/15  Yes Volney American, PA-C  Fluticasone-Salmeterol (ADVAIR) 250-50 MCG/DOSE AEPB Inhale 1 puff into the lungs every 12 (twelve) hours. 08/18/16  Yes Crissman, Jeannette How, MD  latanoprost (XALATAN) 0.005 % ophthalmic solution Place 1 drop into both eyes at bedtime.   Yes [provider]  LEVEMIR FLEXTOUCH 100 UNIT/ML Pen Inject 6 Units into the skin daily at 10 pm. INJECT 6 UNITS PER DAY 01/31/17  Yes Wieting, Richard, MD  levothyroxine (SYNTHROID, LEVOTHROID) 100 MCG tablet Take 1 tablet (100 mcg total) by mouth daily. 11/19/16  Yes Crissman, Jeannette How, MD  magnesium oxide (MAG-OX) 400 MG tablet Take 1 tablet  (400 mg total) by mouth 2 (two) times daily. 04/07/17  Yes Theora Gianotti, NP  metoprolol succinate (TOPROL-XL) 100 MG 24 hr tablet Take 1 tablet (100 mg total) by mouth daily. 08/18/16  Yes Crissman, Jeannette How, MD  mometasone-formoterol (DULERA) 200-5 MCG/ACT AERO Inhale 2 puffs into the lungs 2 (two) times daily. 02/09/17  Yes Johnson, Megan P, DO  nystatin (MYCOSTATIN/NYSTOP) powder Apply topically 4 (four) times daily.   Yes [provider]  Olopatadine HCl (PATADAY) 0.2 % SOLN Apply 1 drop to eye daily.    Yes [provider]  pantoprazole (PROTONIX) 40 MG tablet Take 1 tablet (40 mg total) by mouth daily. 06/15/17 06/15/18 Yes Gollan,  Kathlene November, MD  potassium chloride SA (K-DUR,KLOR-CON) 20 MEQ tablet Take 1 tablet (20 mEq total) by mouth 2 (two) times daily. 06/15/17  Yes Minna Merritts, MD  solifenacin (VESICARE) 10 MG tablet Take 1 tablet (10 mg total) daily by mouth. 03/05/17  Yes Crissman, Jeannette How, MD  blood glucose meter kit and supplies Diagnosis:E11.9 Test glucose twice daily 11/21/14   Johnson, Jinny Blossom P, DO  feeding supplement, ENSURE ENLIVE, (ENSURE ENLIVE) LIQD Take 237 mLs by mouth 2 (two) times daily between meals. Patient not taking: Reported on 08/19/2017 01/31/17   Loletha Grayer, MD  glucose blood test strip Use as instructed Patient not taking: Reported on 08/19/2017 11/05/14   [name], MD  mometasone (ELOCON) 0.1 % cream Apply 1 application topically daily. Apply 1-2 times daily for no longer than 2 weeks at a time 11/24/16   Volney American, PA-C  torsemide (DEMADEX) 20 MG tablet Take 2 tablets (40 mg total) by mouth 2 (two) times daily. 06/15/17 09/13/17  Minna Merritts, MD    Allergies Propoxyphene; Advil [ibuprofen]; Darvocet [propoxyphene n-acetaminophen]; and Percocet [oxycodone-acetaminophen]    Social History Social History   Tobacco Use  . Smoking status: Former Smoker    Last attempt to quit: 04/20/1974    Years since  quitting: 43.4  . Smokeless tobacco: Never Used  . Tobacco comment: Quit 1976  Substance Use Topics  . Alcohol use: No  . Drug use: No    Review of Systems Patient denies headaches, rhinorrhea, blurry vision, numbness, shortness of breath, chest pain, edema, cough, abdominal pain, nausea, vomiting, diarrhea, dysuria, fevers, rashes or hallucinations unless otherwise stated above in HPI. ____________________________________________   PHYSICAL EXAM:  VITAL SIGNS: Vitals:   09/23/2017 2140 10/10/2017 2150  BP: 90/61 (!) 80/58  Pulse: (!) 106   Resp: (!) 30   Temp:    SpO2: 94%     Constitutional: Alert and oriented.  Eyes: Conjunctivae are normal.  Head: Atraumatic. Nose: No congestion/rhinnorhea. Mouth/Throat: Mucous membranes are moist.   Neck: No stridor. Painless ROM.  Cardiovascular: Normal rate, regular rhythm. Grossly normal heart sounds.  Good peripheral circulation. Respiratory: Normal respiratory effort.  No retractions. Lungs CTAB. Gastrointestinal: Soft and nontender. In all four quadrants. No distention. No abdominal bruits. No CVA tenderness. Genitourinary:  Musculoskeletal: No lower extremity tenderness nor edema.  No joint effusions. Neurologic:  Normal speech and language. No gross focal neurologic deficits are appreciated. No facial droop Skin:  Skin is warm, dry and intact. No rash noted. Psychiatric: Mood and affect are normal. Speech and behavior are normal.  ____________________________________________   LABS (all labs ordered are listed, but only abnormal results are displayed)  Results for orders placed or performed during the hospital encounter of 09/29/2017 (from the past 24 hour(s))  Comprehensive metabolic panel     Status: Abnormal   Collection Time: 09/28/2017  5:57 PM  Result Value Ref Range   Sodium 138 135 - 145 mmol/L   Potassium 3.6 3.5 - 5.1 mmol/L   Chloride 93 (L) 101 - 111 mmol/L   CO2 28 22 - 32 mmol/L   Glucose, Bld 154 (H) 65 - 99  mg/dL   BUN 41 (H) 6 - 20 mg/dL   Creatinine, Ser 1.88 (H) 0.44 - 1.00 mg/dL   Calcium 9.9 8.9 - 10.3 mg/dL   Total Protein 10.7 (H) 6.5 - 8.1 g/dL   Albumin 4.7 3.5 - 5.0 g/dL   AST 31 15 - 41 U/L  ALT 16 14 - 54 U/L   Alkaline Phosphatase 61 38 - 126 U/L   Total Bilirubin 1.4 (H) 0.3 - 1.2 mg/dL   GFR calc non Af Amer 24 (L) >60 mL/min   GFR calc Af Amer 28 (L) >60 mL/min   Anion gap 17 (H) 5 - 15  Troponin I     Status: Abnormal   Collection Time: 10/04/2017  5:57 PM  Result Value Ref Range   Troponin I 0.04 (HH) <0.03 ng/mL  CBC WITH DIFFERENTIAL     Status: Abnormal   Collection Time: 09/22/2017  5:57 PM  Result Value Ref Range   WBC 3.2 (L) 3.6 - 11.0 K/uL   RBC 3.97 3.80 - 5.20 MIL/uL   Hemoglobin 13.5 12.0 - 16.0 g/dL   HCT 40.7 35.0 - 47.0 %   MCV 102.6 (H) 80.0 - 100.0 fL   MCH 34.1 (H) 26.0 - 34.0 pg   MCHC 33.3 32.0 - 36.0 g/dL   RDW 16.9 (H) 11.5 - 14.5 %   Platelets 185 150 - 440 K/uL   Neutrophils Relative % 83 %   Neutro Abs 2.7 1.4 - 6.5 K/uL   Lymphocytes Relative 12 %   Lymphs Abs 0.4 (L) 1.0 - 3.6 K/uL   Monocytes Relative 5 %   Monocytes Absolute 0.2 0.2 - 0.9 K/uL   Eosinophils Relative 0 %   Eosinophils Absolute 0.0 0 - 0.7 K/uL   Basophils Relative 0 %   Basophils Absolute 0.0 0 - 0.1 K/uL  Lactic acid, plasma     Status: None   Collection Time: 10/12/2017  6:20 PM  Result Value Ref Range   Lactic Acid, Venous 1.1 0.5 - 1.9 mmol/L   ____________________________________________  EKG My review and personal interpretation at Time: 18:32   Indication: N/V/D  Rate: 100  Rhythm: sinus Axis: left Other: normal intervals, no stemi, nonspeicific st abn ____________________________________________  RADIOLOGY  I personally reviewed all radiographic images ordered to evaluate for the above acute complaints and reviewed radiology reports and findings.  These findings were personally discussed with the patient.  Please see medical record for radiology  report.  ____________________________________________   PROCEDURES  Procedure(s) performed:  .Critical Care Performed by: Merlyn Lot, MD Authorized by: Merlyn Lot, MD   Critical care provider statement:    Critical care time (minutes):  15   Critical care time was exclusive of:  Separately billable procedures and treating other patients   Critical care was necessary to treat or prevent imminent or life-threatening deterioration of the following conditions:  Dehydration   Critical care was time spent personally by me on the following activities:  Development of treatment plan with patient or surrogate, discussions with consultants, evaluation of patient's response to treatment, examination of patient, obtaining history from patient or surrogate, ordering and performing treatments and interventions, ordering and review of laboratory studies, ordering and review of radiographic studies, pulse oximetry, re-evaluation of patient's condition and review of old charts      Critical Care performed: yes ____________________________________________   INITIAL IMPRESSION / [place] / ED COURSE  Pertinent labs & imaging results that were available during my care of the patient were reviewed by me and considered in my medical decision making (see chart for details).   DDX: dehydration, enteritis, uti, electrolyte abn, sepsis  Mia Padilla is a 83 y.o. who presents to the ED with symptoms as described above.  Patient is very frail and elderly appearing.  She is  hypotensive therefore will give IV fluids.  Presentation comp located by her chronic comorbidities as well as history of intra-abdominal process such as SBO.  Will provide IV antiemetics and reassess.  Clinical Course as of Oct 01 2257  Fri Oct 01, 2017  1920 Blood work is fairly reassuring with exception of mild dehydration.  We will give slow IV hydration and reassess.  Will give IV antiemetics.   [PR]  1945  Patient reassessed and again actively vomiting.  Will order CT imaging to concern for obstructive process.   [PR]  2031 Creatinine(!): 1.88 [PR]  2144 Discussed imaging results with patient and family.  Patient does have evidence of high-grade SBO as well as free air in the abdomen.  Suggest a very poor prognosis.  Discussed case with Dr. Rosana Hoes of general surgery who currently agrees to evaluate patient at bedside.  Family and patient understand very poor prognosis and are uncertain if they would even want to pursue surgical intervention.  Patient had NG tube placed patient will require admission to the hospital for further medical management and symptomatic treatment as well as additional IV fluids as she is npo.  Have discussed with the patient and available family all diagnostics and treatments performed thus far and all questions were answered to the best of my ability. The patient demonstrates understanding and agreement with plan.    [PR]    Clinical Course User Index [PR] Merlyn Lot, MD     As part of my medical decision making, I reviewed the following data within the [place] notes reviewed and incorporated, Labs reviewed, notes from prior ED visits and [place] Controlled Substance Database   ____________________________________________   FINAL CLINICAL IMPRESSION(S) / ED DIAGNOSES  Final diagnoses:  Small bowel obstruction ([place])  Epigastric pain      NEW MEDICATIONS STARTED DURING THIS VISIT:  New Prescriptions   No medications on file     Note:  This document was prepared using Dragon voice recognition software and may include unintentional dictation errors.    Merlyn Lot, MD 10/07/2017 Candida Peeling    Merlyn Lot, MD 10/10/17 1101

## 2017-10-01 NOTE — ED Notes (Signed)
NG tube removed due to coiling and placement is not correct. Attempted a second time with a 16 french in the left nare with no success at verifying placement with auscultation. MD aware. Charge RN to attempt.

## 2017-10-01 NOTE — Consult Note (Addendum)
SURGICAL CONSULTATION NOTE (initial) - cpt: 94174  HISTORY OF PRESENT ILLNESS (HPI):  83 y.o. female well known to surgical service over the past several months since non-operative management of pneumoperitoneum with RUQ peritoneal fluid and inflammation believed to be consistent with perforated duodenal ulcer presented to [facility] ED today for evaluation of severe nausea and non-bloody emesis. Patient and her family report she hasn't been feeling well with increasing weakness, lethargy, dizziness, and 20 lbs unintentional weight loss x 2 months. Over the past 2 weeks, her family reports having noticed her abdomen has become increasingly distended despite +flatus and BM's having become loose without bright red blood per rectum or melena. Last colonoscopy was reported to have been "many" (much >10) years ago.  Last night, this progressed to severe nausea with multiple episodes of non-bloody emesis and diarrhea. Upon EMS arrival, patient was noted to be hypotensive, for which IV fluids were administered with improved BP. Patient reported "I just feel tired" with chills, but denies fever, CP, or SOB. Since placement, NG tube drained nearly 3L. As stated during prior admission 01/2017, patient and her 3 daughters say they do not wish to consider surgery as part of management under any circumstances.  Surgery is consulted by ED physician Dr. Quentin Cornwall in this context for evaluation and management of SBO.  PAST MEDICAL HISTORY (PMH):  Past Medical History:  Diagnosis Date  . Arthritis    hands  . Black head   . Bowel incontinence   . Breast cancer ([place]) 11/15/2013  . Breast cancer of upper-outer quadrant of left female breast ([place]) 2015   Left  . Carotid artery stenosis    a. ultrasound 0/8144: 8-18 LICA stenosis, 56-31% RICA stenosis; b. followed by vascular  . Carotid stenosis 11/17/2016  . Carpal tunnel syndrome   . Chronic combined systolic and diastolic CHF, NYHA class 3 ([place])    a. TTE 10/18: EF  40%, diffuse HK, DD, calcifief mitral annulus with mild MR, mildly dilated left atrium, normal RV cavity size and systolic function, PASP 49-70 mmHg  . COPD (chronic obstructive pulmonary disease) ([place])   . Debilitated   . Diabetes mellitus with complication ([place])   . Glaucoma   . History of seizures   . History of stress test    a. 11/18: small defect of mild severity present in the apex location felt to be 2/2 breast attenuation. Normal study  . Hyperlipidemia   . Hypertension   . Hypomagnesemia   . Hypotension   . Hypothyroidism 02/07/2015  . Mesenteric artery stenosis ([place])   . Obese 06/04/2011  . Obesity   . PAF (paroxysmal atrial fibrillation) ([facility])    a. on Eliquis; b. CHADS2VASc 8 (CHF, HTN, age x 2, DM, vascular disease, sex category)  . Perforated duodenal ulcer ([place]) 01/2017   a. conservatively managed  . Peripheral vascular disease ([place]) 04/23/2015  . SMA stenosis ([place])    a. noted 10/18; b. conservatively managed; c. unclear if thrombus vs plaque; d. on Eliquis given PAF and Lipitor  . Urinary incontinence      PAST SURGICAL HISTORY ([place]):  Past Surgical History:  Procedure Laterality Date  . BOWEL RESECTION  2005  . BREAST SURGERY  08/03/13   left mastectomy  . CARPAL TUNNEL RELEASE Right 30 years   . MASTECTOMY     left   . PARTIAL HYSTERECTOMY    . TONSILLECTOMY    . UMBILICAL HERNIA REPAIR       MEDICATIONS:  Prior to  Admission medications   Medication Sig Start Date End Date Taking? Authorizing Provider  acetaminophen (TYLENOL) 650 MG CR tablet Take 650 mg by mouth every 8 (eight) hours as needed for pain.   Yes [provider]  amLODipine-olmesartan (AZOR) 5-40 MG tablet Take 0.5 tablets by mouth daily. 07/13/17  Yes Minna Merritts, MD  anastrozole (ARIMIDEX) 1 MG tablet TAKE 1 TABLET BY MOUTH ONCE DAILY 06/16/17  Yes Byrnett, Forest Gleason, MD  apixaban (ELIQUIS) 5 MG TABS tablet Take 5 mg by mouth 2 (two) times daily.   Yes [provider]   atorvastatin (LIPITOR) 20 MG tablet TAKE 1 TABLET BY MOUTH ONCE DAILY AT  6PM 09/23/17  Yes Crissman, Mark A, MD  brimonidine (ALPHAGAN P) 0.1 % SOLN 2 (two) times daily.    Yes [provider]  cyanocobalamin (,VITAMIN B-12,) 1000 MCG/ML injection Inject 1 mL (1,000 mcg total) into the muscle every 30 (thirty) days. 09/27/17  Yes Crissman, Jeannette How, MD  dorzolamide (TRUSOPT) 2 % ophthalmic solution 1 drop 2 (two) times daily.    Yes [provider]  fluticasone (FLONASE) 50 MCG/ACT nasal spray Place 2 sprays into both nostrils daily. 10/14/15  Yes Volney American, PA-C  Fluticasone-Salmeterol (ADVAIR) 250-50 MCG/DOSE AEPB Inhale 1 puff into the lungs every 12 (twelve) hours. 08/18/16  Yes Crissman, Jeannette How, MD  latanoprost (XALATAN) 0.005 % ophthalmic solution Place 1 drop into both eyes at bedtime.   Yes [provider]  LEVEMIR FLEXTOUCH 100 UNIT/ML Pen Inject 6 Units into the skin daily at 10 pm. INJECT 6 UNITS PER DAY 01/31/17  Yes Wieting, Richard, MD  levothyroxine (SYNTHROID, LEVOTHROID) 100 MCG tablet Take 1 tablet (100 mcg total) by mouth daily. 11/19/16  Yes Crissman, Jeannette How, MD  magnesium oxide (MAG-OX) 400 MG tablet Take 1 tablet (400 mg total) by mouth 2 (two) times daily. 04/07/17  Yes Theora Gianotti, NP  metoprolol succinate (TOPROL-XL) 100 MG 24 hr tablet Take 1 tablet (100 mg total) by mouth daily. 08/18/16  Yes Crissman, Jeannette How, MD  mometasone-formoterol (DULERA) 200-5 MCG/ACT AERO Inhale 2 puffs into the lungs 2 (two) times daily. 02/09/17  Yes Johnson, Megan P, DO  nystatin (MYCOSTATIN/NYSTOP) powder Apply topically 4 (four) times daily.   Yes [provider]  Olopatadine HCl (PATADAY) 0.2 % SOLN Apply 1 drop to eye daily.    Yes [provider]  pantoprazole (PROTONIX) 40 MG tablet Take 1 tablet (40 mg total) by mouth daily. 06/15/17 06/15/18 Yes Gollan, Kathlene November, MD  potassium chloride SA (K-DUR,KLOR-CON) 20 MEQ tablet Take 1  tablet (20 mEq total) by mouth 2 (two) times daily. 06/15/17  Yes Minna Merritts, MD  solifenacin (VESICARE) 10 MG tablet Take 1 tablet (10 mg total) daily by mouth. 03/05/17  Yes Crissman, Jeannette How, MD  blood glucose meter kit and supplies Diagnosis:E11.9 Test glucose twice daily 11/21/14   Johnson, Jinny Blossom P, DO  feeding supplement, ENSURE ENLIVE, (ENSURE ENLIVE) LIQD Take 237 mLs by mouth 2 (two) times daily between meals. Patient not taking: Reported on 08/19/2017 01/31/17   Loletha Grayer, MD  glucose blood test strip Use as instructed Patient not taking: Reported on 08/19/2017 11/05/14   [name], MD  mometasone (ELOCON) 0.1 % cream Apply 1 application topically daily. Apply 1-2 times daily for no longer than 2 weeks at a time 11/24/16   Volney American, PA-C  torsemide (DEMADEX) 20 MG tablet Take 2 tablets (40 mg total)  by mouth 2 (two) times daily. 06/15/17 09/13/17  Minna Merritts, MD     ALLERGIES:  Allergies  Allergen Reactions  . Propoxyphene Other (See Comments)    Other Reaction: Severe Headache  . Advil [Ibuprofen] Other (See Comments)    Makes her heart race  . Darvocet [Propoxyphene N-Acetaminophen]   . Percocet [Oxycodone-Acetaminophen]     hallucination     SOCIAL HISTORY:  Social History   Socioeconomic History  . Marital status: Divorced    Spouse name: Not on file  . Number of children: Not on file  . Years of education: Not on file  . Highest education level: Not on file  Occupational History  . Not on file  Social Needs  . Financial resource strain: Not on file  . Food insecurity:    Worry: Not on file    Inability: Not on file  . Transportation needs:    Medical: Not on file    Non-medical: Not on file  Tobacco Use  . Smoking status: Former Smoker    Last attempt to quit: 04/20/1974    Years since quitting: 43.4  . Smokeless tobacco: Never Used  . Tobacco comment: Quit 1976  Substance and Sexual Activity  . Alcohol use: No  . Drug use:  No  . Sexual activity: Never  Lifestyle  . Physical activity:    Days per week: Not on file    Minutes per session: Not on file  . Stress: Not on file  Relationships  . Social connections:    Talks on phone: Not on file    Gets together: Not on file    Attends religious service: Not on file    Active member of club or organization: Not on file    Attends meetings of clubs or organizations: Not on file    Relationship status: Not on file  . Intimate partner violence:    Fear of current or ex partner: Not on file    Emotionally abused: Not on file    Physically abused: Not on file    Forced sexual activity: Not on file  Other Topics Concern  . Not on file  Social History Narrative  . Not on file    The patient currently resides (home / rehab facility / nursing home): Home The patient normally is (ambulatory / bedbound): Limited Ambulation   FAMILY HISTORY:  Family History  Problem Relation Age of Onset  . Cervical cancer Mother   . Diabetes Mother   . Glaucoma Mother   . Cervical cancer Daughter   . Hypertension Daughter   . Hypertension Father   . Hypertension Sister   . Hypertension Brother   . Hypertension Son   . Diabetes Brother   . HIV Brother   . Breast cancer Neg Hx      REVIEW OF SYSTEMS:  Constitutional: weight loss, weakness, and progressive lethargy as per HPI, denies, fever, chills, or sweats  Eyes: denies any other vision changes, history of eye injury  ENT: denies sore throat, hearing problems  Respiratory: denies shortness of breath, wheezing  Cardiovascular: denies chest pain, palpitations  Gastrointestinal:abdominal pain, N/V, and bowel function as per HPI Genitourinary: denies burning with urination or urinary frequency Musculoskeletal: denies any other joint pains or cramps  Skin: denies any other rashes or skin discolorations  Neurological: denies any other headache, dizziness, weakness  Psychiatric: denies any other depression, anxiety, says  "just feel tired"  All other review of systems were negative  VITAL SIGNS:  Temp:  [97.4 F (36.3 C)] 97.4 F (36.3 C) (06/14 1753) Pulse Rate:  [98-110] 106 (06/14 2140) Resp:  [16-42] 30 (06/14 2140) BP: (67-108)/(50-66) 80/58 (06/14 2150) SpO2:  [90 %-100 %] 94 % (06/14 2140) Weight:  [181 lb (82.1 kg)] 181 lb (82.1 kg) (06/14 1754)     Height: '5\' 2"'$  (157.5 cm) Weight: 181 lb (82.1 kg) BMI (Calculated): 33.1   INTAKE/OUTPUT:  This shift: No intake/output data recorded.  Last 2 shifts: '@IOLAST2SHIFTS'$ @   PHYSICAL EXAM:  Constitutional:  -- Overweight body habitus  -- Awake, alert, and oriented x3, no apparent distress, somewhat sleepy after NG tube placed with relief from nausea and emesis Eyes:  -- Pupils equally round and reactive to light  -- No scleral icterus, B/L no occular discharge Ear, nose, throat: -- Neck is FROM WNL -- No jugular venous distension  Pulmonary:  -- No wheezes or rhales -- Equal breath sounds bilaterally -- Breathing non-labored at rest Cardiovascular:  -- S1, S2 present  -- No pericardial rubs  Gastrointestinal:  -- Abdomen soft and not particularly tender to palpation despite moderate abdominal distention, no guarding or rebound tenderness -- No abdominal masses appreciated, pulsatile or otherwise  Musculoskeletal and Integumentary:  -- Wounds or skin discoloration: None appreciated -- Extremities: B/L UE and LE FROM, hands and feet warm  Neurologic:  -- Motor function: Intact and symmetric -- Sensation: Intact and symmetric Psychiatric:  -- Mood and affect WNL  Labs:  CBC Latest Ref Rng & Units 09/18/2017 06/02/2017 04/06/2017  WBC 3.6 - 11.0 K/uL 3.2(L) 2.8(L) 3.1(L)  Hemoglobin 12.0 - 16.0 g/dL 13.5 11.3(L) 11.5  Hematocrit 35.0 - 47.0 % 40.7 34.2(L) 36.0  Platelets 150 - 440 K/uL 185 170 156   CMP Latest Ref Rng & Units 10/06/2017 08/10/2017 07/06/2017  Glucose 65 - 99 mg/dL 154(H) 93 105(H)  BUN 6 - 20 mg/dL 41(H) 23 38(H)   Creatinine 0.44 - 1.00 mg/dL 1.88(H) 1.16(H) 1.33(H)  Sodium 135 - 145 mmol/L 138 139 140  Potassium 3.5 - 5.1 mmol/L 3.6 4.8 4.3  Chloride 101 - 111 mmol/L 93(L) 100 97(L)  CO2 22 - 32 mmol/L '28 27 30  '$ Calcium 8.9 - 10.3 mg/dL 9.9 9.1 9.4  Total Protein 6.5 - 8.1 g/dL 10.7(H) - -  Total Bilirubin 0.3 - 1.2 mg/dL 1.4(H) - -  Alkaline Phos 38 - 126 U/L 61 - -  AST 15 - 41 U/L 31 - -  ALT 14 - 54 U/L 16 - -   Imaging studies:  CT Abdomen and Pelvis without Contrast (10/09/2017) - personally reviewed, compared to prior CT imaging studies (01/2017 and 02/2017 in particular), reviewed and discussed at length with interpreting radiologist, and discussed with family -- though interpretation notes mesenteric swirl, this was present and essentially unchanged on at least past 2 CT's with high-grade distal bowel obstruction involving all of patient's small intestine with no visualized collapsed small bowel despite non-dilated colon and ileo-cecal junction better visualized on prior studies along with progressive enlargement of RUQ cystic and possibly mucinous process on each consecutive study, associated on today's CT with a small focus of likely extraluminal air, though much less than seen near this location on prior study  Per radiology interpretation in [place] small bowel obstruction with transition point visualized in the right upper quadrant. Swirled appearance of bowel and mesentery at the transition point, raising concern for volvulus/closed loop obstruction. Small bowel within the right upper quadrant distal to  the point of obstruction is abnormal in morphology with multiple peripheral cystic collections, suspected to represent cystic pneumatosis, present on prior exams but increased in the interim. Additional finding of small foci of pneumoperitoneum, possibly due to the pneumatosis although hollow viscus perforation is also considered. 2. Multiple kidney stones without  hydronephrosis. Thick-walled bladder, possibly due to under distension although cystitis is also possible.  Assessment/Plan: (ICD-10's: K82.691) 83 y.o. female with high-grade distal small bowel obstruction (possibly at ileocecal junction) of unclear etiology with chronic progression on each consecutive CT imaging study of a cystic possibly mucinous process in the RUQ previously attributed to perforated duodenal ulcer but has progressed over time, together concerning for possible neoplastic process and complicated by complex and rather extensive medical comorbidities including advanced chronological age, obesity (BMI >30 despite reported recent weight loss) and malnutrition, DM, HTN, HLD, CAD with atrial fibrillation, Eliquis anticoagulation and Plavix for SMA thromboembolus attributed to atrial fibrillation while off of anticoagulation due to fall risk, CHF with LV dysfunction, COPD not currently on home oxygen, hypothyroidism, seizure disorder, history of breast cancer on Arimidex, former chronic tobacco abuse (smoking), and bowel + urinary incontinence.   - NPO, IV fluids, and NG decompression  - patient and family again state they do not wish to consider surgery  - will plan to review patient's imaging studies further with CT body-specific radiologist tomorrow per discussion with on-call general diagnostic radiologist  - it is unclear at this time whether this is a process and etiology from which patient will ultimately be able to recover, and possibility of hospice care was discussed  - continue to monitor abdominal exam and bowel function  - medical management of comorbidities  - DVT prophylaxis  All of the above findings and recommendations were discussed with the patient and her 3 daughters, and all of patient's and her family's questions were again answered to their expressed satisfaction.  Thank you for the opportunity to participate in this patient's care.   -- Marilynne Drivers Rosana Hoes, MD,  [place]: [facility] - Partnering for exceptional care. Office: [phone]

## 2017-10-01 NOTE — ED Triage Notes (Signed)
Pt arrives via [facility] from home with c/o vomiting this morning and diarrhea that began last night. Pt states she woke up not feeling right this AM and cancelled appt with PCP. Denies fever and chills. Hypotensive upon EMS arrival.

## 2017-10-01 NOTE — ED Notes (Signed)
Pt is vomiting now. Order for zofran in place.

## 2017-10-01 NOTE — ED Notes (Signed)
NG , 2770ml fluid suctioned

## 2017-10-02 ENCOUNTER — Encounter: Payer: Self-pay | Admitting: *Deleted

## 2017-10-02 DIAGNOSIS — R6521 Severe sepsis with septic shock: Secondary | ICD-10-CM | POA: Diagnosis present

## 2017-10-02 DIAGNOSIS — Z66 Do not resuscitate: Secondary | ICD-10-CM | POA: Diagnosis present

## 2017-10-02 DIAGNOSIS — N179 Acute kidney failure, unspecified: Secondary | ICD-10-CM | POA: Diagnosis present

## 2017-10-02 DIAGNOSIS — E46 Unspecified protein-calorie malnutrition: Secondary | ICD-10-CM | POA: Diagnosis present

## 2017-10-02 DIAGNOSIS — Z7951 Long term (current) use of inhaled steroids: Secondary | ICD-10-CM | POA: Diagnosis not present

## 2017-10-02 DIAGNOSIS — I480 Paroxysmal atrial fibrillation: Secondary | ICD-10-CM | POA: Diagnosis present

## 2017-10-02 DIAGNOSIS — Z87891 Personal history of nicotine dependence: Secondary | ICD-10-CM | POA: Diagnosis not present

## 2017-10-02 DIAGNOSIS — Z885 Allergy status to narcotic agent status: Secondary | ICD-10-CM | POA: Diagnosis not present

## 2017-10-02 DIAGNOSIS — I5042 Chronic combined systolic (congestive) and diastolic (congestive) heart failure: Secondary | ICD-10-CM | POA: Diagnosis present

## 2017-10-02 DIAGNOSIS — M19042 Primary osteoarthritis, left hand: Secondary | ICD-10-CM | POA: Diagnosis present

## 2017-10-02 DIAGNOSIS — Z79890 Hormone replacement therapy (postmenopausal): Secondary | ICD-10-CM | POA: Diagnosis not present

## 2017-10-02 DIAGNOSIS — Z90711 Acquired absence of uterus with remaining cervical stump: Secondary | ICD-10-CM | POA: Diagnosis not present

## 2017-10-02 DIAGNOSIS — K56609 Unspecified intestinal obstruction, unspecified as to partial versus complete obstruction: Secondary | ICD-10-CM | POA: Diagnosis present

## 2017-10-02 DIAGNOSIS — Z886 Allergy status to analgesic agent status: Secondary | ICD-10-CM | POA: Diagnosis not present

## 2017-10-02 DIAGNOSIS — A419 Sepsis, unspecified organism: Secondary | ICD-10-CM | POA: Diagnosis present

## 2017-10-02 DIAGNOSIS — Z7901 Long term (current) use of anticoagulants: Secondary | ICD-10-CM | POA: Diagnosis not present

## 2017-10-02 DIAGNOSIS — E1151 Type 2 diabetes mellitus with diabetic peripheral angiopathy without gangrene: Secondary | ICD-10-CM | POA: Diagnosis present

## 2017-10-02 DIAGNOSIS — J449 Chronic obstructive pulmonary disease, unspecified: Secondary | ICD-10-CM | POA: Diagnosis present

## 2017-10-02 DIAGNOSIS — R1013 Epigastric pain: Secondary | ICD-10-CM | POA: Diagnosis present

## 2017-10-02 DIAGNOSIS — Z9012 Acquired absence of left breast and nipple: Secondary | ICD-10-CM | POA: Diagnosis not present

## 2017-10-02 DIAGNOSIS — R6520 Severe sepsis without septic shock: Secondary | ICD-10-CM

## 2017-10-02 DIAGNOSIS — Z79899 Other long term (current) drug therapy: Secondary | ICD-10-CM | POA: Diagnosis not present

## 2017-10-02 DIAGNOSIS — K631 Perforation of intestine (nontraumatic): Secondary | ICD-10-CM | POA: Diagnosis present

## 2017-10-02 DIAGNOSIS — I110 Hypertensive heart disease with heart failure: Secondary | ICD-10-CM | POA: Diagnosis present

## 2017-10-02 DIAGNOSIS — Z853 Personal history of malignant neoplasm of breast: Secondary | ICD-10-CM | POA: Diagnosis not present

## 2017-10-02 DIAGNOSIS — K56691 Other complete intestinal obstruction: Secondary | ICD-10-CM

## 2017-10-02 DIAGNOSIS — K668 Other specified disorders of peritoneum: Secondary | ICD-10-CM | POA: Diagnosis present

## 2017-10-02 DIAGNOSIS — M19041 Primary osteoarthritis, right hand: Secondary | ICD-10-CM | POA: Diagnosis present

## 2017-10-02 LAB — CBC
HCT: 34.9 % — ABNORMAL LOW (ref 35.0–47.0)
HEMOGLOBIN: 11.5 g/dL — AB (ref 12.0–16.0)
MCH: 34 pg (ref 26.0–34.0)
MCHC: 32.8 g/dL (ref 32.0–36.0)
MCV: 103.4 fL — ABNORMAL HIGH (ref 80.0–100.0)
Platelets: 152 10*3/uL (ref 150–440)
RBC: 3.38 MIL/uL — ABNORMAL LOW (ref 3.80–5.20)
RDW: 16.8 % — ABNORMAL HIGH (ref 11.5–14.5)
WBC: 1.5 10*3/uL — ABNORMAL LOW (ref 3.6–11.0)

## 2017-10-02 LAB — BASIC METABOLIC PANEL
ANION GAP: 18 — AB (ref 5–15)
BUN: 45 mg/dL — ABNORMAL HIGH (ref 6–20)
CALCIUM: 8.6 mg/dL — AB (ref 8.9–10.3)
CO2: 27 mmol/L (ref 22–32)
Chloride: 95 mmol/L — ABNORMAL LOW (ref 101–111)
Creatinine, Ser: 2.59 mg/dL — ABNORMAL HIGH (ref 0.44–1.00)
GFR calc Af Amer: 19 mL/min — ABNORMAL LOW (ref >60)
GFR, EST NON AFRICAN AMERICAN: 16 mL/min — AB (ref >60)
Glucose, Bld: 167 mg/dL — ABNORMAL HIGH (ref 65–99)
Potassium: 3.6 mmol/L (ref 3.5–5.1)
Sodium: 140 mmol/L (ref 135–145)

## 2017-10-02 LAB — GLUCOSE, CAPILLARY: GLUCOSE-CAPILLARY: 167 mg/dL — AB (ref 65–99)

## 2017-10-02 MED ORDER — GLYCOPYRROLATE 0.2 MG/ML IJ SOLN
0.1000 mg | Freq: Four times a day (QID) | INTRAMUSCULAR | Status: DC | PRN
  Filled 2017-10-02: qty 0.5

## 2017-10-02 MED ORDER — METRONIDAZOLE IN NACL 5-0.79 MG/ML-% IV SOLN
500.0000 mg | Freq: Three times a day (TID) | INTRAVENOUS | Status: DC
  Administered 2017-10-02: 500 mg via INTRAVENOUS
  Filled 2017-10-02 (×3): qty 100

## 2017-10-02 MED ORDER — LATANOPROST 0.005 % OP SOLN
1.0000 [drp] | Freq: Every day | OPHTHALMIC | Status: DC
  Filled 2017-10-02: qty 2.5

## 2017-10-02 MED ORDER — ONDANSETRON HCL 4 MG PO TABS: 4.0000 mg | ORAL_TABLET | Freq: Four times a day (QID) | ORAL | Status: DC | PRN

## 2017-10-02 MED ORDER — ENOXAPARIN SODIUM 40 MG/0.4ML ~~LOC~~ SOLN: 40.0000 mg | SUBCUTANEOUS | Status: DC

## 2017-10-02 MED ORDER — SODIUM CHLORIDE 0.9 % IV SOLN
INTRAVENOUS | Status: DC
  Administered 2017-10-02: 03:00:00 via INTRAVENOUS

## 2017-10-02 MED ORDER — MORPHINE SULFATE (PF) 2 MG/ML IV SOLN
2.0000 mg | INTRAVENOUS | Status: DC | PRN
  Administered 2017-10-02: 2 mg via INTRAVENOUS
  Filled 2017-10-02: qty 1

## 2017-10-02 MED ORDER — LORAZEPAM 2 MG/ML IJ SOLN
1.0000 mg | INTRAMUSCULAR | Status: DC | PRN
Start: 2017-10-02 — End: 2017-10-02

## 2017-10-02 MED ORDER — ACETAMINOPHEN 650 MG RE SUPP: 650.0000 mg | Freq: Four times a day (QID) | RECTAL | Status: DC | PRN

## 2017-10-02 MED ORDER — SODIUM CHLORIDE 0.9 % IV SOLN
2.0000 g | INTRAVENOUS | Status: DC
  Filled 2017-10-02: qty 2

## 2017-10-02 MED ORDER — VANCOMYCIN HCL IN DEXTROSE 1-5 GM/200ML-% IV SOLN
1000.0000 mg | INTRAVENOUS | Status: DC
  Filled 2017-10-02: qty 200

## 2017-10-02 MED ORDER — MORPHINE SULFATE (PF) 2 MG/ML IV SOLN: 2.0000 mg | INTRAVENOUS | Status: DC | PRN

## 2017-10-02 MED ORDER — ENOXAPARIN SODIUM 30 MG/0.3ML ~~LOC~~ SOLN: 30.0000 mg | SUBCUTANEOUS | Status: DC

## 2017-10-02 MED ORDER — MOMETASONE FURO-FORMOTEROL FUM 200-5 MCG/ACT IN AERO
2.0000 | INHALATION_SPRAY | Freq: Two times a day (BID) | RESPIRATORY_TRACT | Status: DC
  Filled 2017-10-02: qty 8.8

## 2017-10-02 MED ORDER — ONDANSETRON HCL 4 MG/2ML IJ SOLN: 4.0000 mg | Freq: Four times a day (QID) | INTRAMUSCULAR | Status: DC | PRN

## 2017-10-02 MED ORDER — INSULIN ASPART 100 UNIT/ML ~~LOC~~ SOLN
0.0000 [IU] | Freq: Four times a day (QID) | SUBCUTANEOUS | Status: DC
  Administered 2017-10-02: 2 [IU] via SUBCUTANEOUS
  Filled 2017-10-02: qty 1

## 2017-10-02 MED ORDER — ACETAMINOPHEN 325 MG PO TABS: 650.0000 mg | ORAL_TABLET | Freq: Four times a day (QID) | ORAL | Status: DC | PRN

## 2017-10-06 LAB — CULTURE, BLOOD (ROUTINE X 2)
CULTURE: NO GROWTH
Culture: NO GROWTH
Special Requests: ADEQUATE
Special Requests: ADEQUATE

## 2017-10-18 NOTE — Progress Notes (Addendum)
Notified Dr Verdell Carmine via telephone of change in pt status. Pt now alert only to self, confused, at time apneic and restless. MD to speak with family when he arrives to hospital.  Morphine given per family request. Family verbalizes to RN that pt wanted to "go peacefully when she was ready". Educated family member that MD was on the way and would speak with her when he arrived. Pt appears to be slightly less restless after morphine.

## 2017-10-18 NOTE — ED Notes (Signed)
Pt continues to sleep.

## 2017-10-18 NOTE — ED Notes (Signed)
Dr Jannifer Franklin in with pt.

## 2017-10-18 NOTE — Progress Notes (Signed)
   2017/10/03 1000  Clinical Encounter Type  Visited With Family  Visit Type Death  Spiritual Encounters  Spiritual Needs Grief support;Prayer   Chaplain heard crying in hallway outside on-call suite and went out to assist.  This was daughter and granddaughter of a woman who had just died. One was consoling the other and they asked if they could take a walk together to collect themselves before returning.  Concurrently, Chaplain received page from [facility] to request support for family whose relative just died and confirmed there were additional family still in the room.  Chaplain went to 47 and provided pastoral presence and emotional support for several family members there.  After a few minutes, Chaplain suggested family could gather together and have more privacy in the waiting room and took family there.  Additional family arrived.  Daughter stated their pastor is on the way.  Chaplain offered prayer and emotional support to 10 family members in the waiting room.  After staff prepared the room, Chaplain gathered with family in Room 206 and led prayer with 45 extended family members.  Chaplain facilitated discussion of patient among her relatives and when Chaplain left, family was laughing and telling stories, remembering this woman's life.  Provided pastoral presence.  Chaplain offered to return for further support if needed and family thanked Chaplain for coming.

## 2017-10-18 NOTE — Progress Notes (Signed)
Notified by clinical secretary, MD called to say he would be delayed with an admission.

## 2017-10-18 NOTE — Progress Notes (Signed)
Lovenox changed to 30 mg daily for BMI <40 and CrCl <30 

## 2017-10-18 NOTE — Progress Notes (Signed)
Notified by family they were finished in the room. Identified pt via nameband. Verified toe tag information correct with sticker. Placed bag on pt. Bag labeled with sticker.  Supervisor notified and transport called.

## 2017-10-18 NOTE — Progress Notes (Signed)
Pharmacy Antibiotic Note  Mia Padilla is a 83 y.o. female admitted on 09/23/2017 with SBO/perforation.  Pharmacy has been consulted for vancomycin and cefepime dosing.  Plan: DW 63kg  Vd 44L kei 0.024 hr-1  T1/2 29 hours Vancomycin 1 gram q 36 hours ordered with stacked dosing. Level before 4th dose. Goal trough 15-20.  Cefepime 2 grams q 24 hours ordered.  Height: 5\' 2"  (157.5 cm) Weight: 172 lb 4.8 oz (78.2 kg) IBW/kg (Calculated) : 50.1  Temp (24hrs), Avg:97.6 F (36.4 C), Min:97.4 F (36.3 C), Max:97.8 F (36.6 C)  Recent Labs  Lab 10/07/2017 1757 10/11/2017 1820  WBC 3.2*  --   CREATININE 1.88*  --   LATICACIDVEN  --  1.1    Estimated Creatinine Clearance: 23.1 mL/min (A) (by C-G formula based on SCr of 1.88 mg/dL (H)).    Allergies  Allergen Reactions  . Propoxyphene Other (See Comments)    Other Reaction: Severe Headache  . Advil [Ibuprofen] Other (See Comments)    Makes her heart race  . Darvocet [Propoxyphene N-Acetaminophen]   . Percocet [Oxycodone-Acetaminophen]     hallucination    Antimicrobials this admission: Vancomycin, cefepime 6/14  >>    >>   Dose adjustments this admission:   Microbiology results: 6/15 BCx: pending   Thank you for allowing pharmacy to be a part of this patient's care.  Kriss Perleberg S 10-09-2017 3:56 AM

## 2017-10-18 NOTE — Death Summary Note (Signed)
DEATH SUMMARY   Patient Details  Name: Mia Padilla MRN: [id number] DOB: [birth year]  Admission/Discharge Information   Admit Date:  October 18, 2017  Date of Death: Date of Death: 19-Oct-2017  Time of Death: Time of Death: 1000  Length of Stay: 0  Referring Physician: [name], MD   Reason(s) for Hospitalization  Small bowel obstruction with perforated viscus, septic shock.  Diagnoses  Preliminary cause of death:  Secondary Diagnoses (including complications and co-morbidities):  Principal Problem:   Severe sepsis ([place]) Active Problems:   COPD (chronic obstructive pulmonary disease) (HCC)   Hypertension   PAF (paroxysmal atrial fibrillation) (HCC)   Diabetes mellitus with complication (HCC)   Chronic diastolic heart failure (HCC)   SBO (small bowel obstruction) (HCC)   Peritoneal free air   AKI (acute kidney injury) [facility])   Brief Hospital Course (including significant findings, care, treatment, and services provided and events leading to death)  Mia Padilla is a 83 y.o. year old female with past medical history of COPD, paroxysmal defibrillation, diabetes, chronic diastolic CHF who presented to the hospital due to abdominal pain and noted to have a high-grade small bowel obstruction with perforated viscus and was noted to be in septic shock.  1.  Septic shock 2.  Perforated viscus 3.  Small bowel obstruction 4.  acute renal failure 5. COPD 6. A. Fib 7. HTN  Patient was admitted to the hospital and started on broad-spectrum IV antibiotics for her sepsis, a surgical consult was obtained to discuss her CT scan findings suggestive of a high-grade small bowel obstruction with perforated viscus.  Patient's family did not want to pursue aggressive surgical intervention given her advanced age and multiple comorbidities.  Patient was admitted and treated supportively with IV antibiotics IV fluids.  On the morning of 2017/10/19 patient's clinical condition was worsening and  patient's family was reapproached and they did not want to pursue any further intervention and therefore the patient was made comfort care only. -Patient was started on some IV Ativan, IV morphine as needed for comfort.   -Patient passed away later that morning on 10/19/17 and patient's family was made aware.  Pertinent Labs and Studies  Significant Diagnostic Studies Ct Abdomen Pelvis Wo Contrast  Result Date: Oct 18, 2017 CLINICAL DATA:  Nausea vomiting and abdominal pain EXAM: CT ABDOMEN AND PELVIS WITHOUT CONTRAST TECHNIQUE: Multidetector CT imaging of the abdomen and pelvis was performed following the standard protocol without IV contrast. COMPARISON:  MRI 06/10/2017, CT abdomen pelvis 02/22/2017 FINDINGS: Lower chest: Lung bases demonstrate no acute consolidation or pleural effusion. Heart size is within normal limits. Hepatobiliary: No focal liver abnormality is seen. No gallstones, gallbladder wall thickening, or biliary dilatation. Pancreas: Unremarkable. No pancreatic ductal dilatation or surrounding inflammatory changes. Spleen: Splenic granuloma. Adrenals/Urinary Tract: Adrenal glands are within normal limits. No hydronephrosis. Cysts within the upper poles of the kidneys. Multiple stones in the right kidney, largest is seen in the upper pole and measures 8 mm. Dominant stone lower pole left kidney measuring 5 mm. The bladder is nearly empty and is slightly thick-walled. Stomach/Bowel: Low lying stomach. Moderate fluid distention of the stomach. Multiple fluid-filled dilated loops of small bowel, measuring up to 6 cm in diameter. Transition point visualized in the right upper quadrant, series 2, image number 21 where there is swirled appearance of bowel and mesentery. Loop of bowel in the right upper quadrant, anterior to the liver with multiple cystic air collections at the bowel periphery suspect for  cystic pneumatosis, increased compared to the prior study. Distal small bowel and colon are  completely collapsed. Vascular/Lymphatic: Moderate aortic atherosclerosis. No aneurysmal dilatation. No significantly enlarged lymph nodes. Reproductive: Status post hysterectomy. No adnexal masses. Other: No significant free fluid. Small foci of free air within the anterior abdomen. Musculoskeletal: Degenerative changes of the spine. No acute or suspicious abnormality. Advanced arthritis of the left hip. IMPRESSION: 1. High-grade small bowel obstruction with transition point visualized in the right upper quadrant. Swirled appearance of bowel and mesentery at the transition point, raising concern for volvulus/closed loop obstruction. Small bowel within the right upper quadrant distal to the point of obstruction is abnormal in morphology with multiple peripheral cystic collections, suspected to represent cystic pneumatosis, present on prior exams but increased in the interim. Additional finding of small foci of pneumoperitoneum, possibly due to the pneumatosis although hollow viscus perforation is also considered. 2. Multiple kidney stones without hydronephrosis. Thick-walled bladder, possibly due to under distension although cystitis is also possible. Critical Value/emergent results were called by telephone at the time of interpretation on 10/03/2017 at 8:56 pm to Dr. Merlyn Lot , who verbally acknowledged these results. Electronically Signed   By: Donavan Foil M.D.   On: 10/08/2017 20:57   Dg Chest Portable 1 View  Result Date: 10/16/2017 CLINICAL DATA:  Verify NG tube placement EXAM: PORTABLE CHEST 1 VIEW COMPARISON:  10/07/2017 FINDINGS: Esophageal tube is folded back upon itself over the distal esophagus, tip is directed cephalad. Low lung volumes without acute opacity. Cardiomegaly. Cystic lucencies in the right upper quadrant, likely corresponding to combination of diffuse cystic bowel disease and small free air noted on the comparison CT. IMPRESSION: 1. Esophageal tube tip is folded back upon itself  over the distal esophagus with the tip directed cephalad 2. Lucency in the right upper quadrant, likely reflecting combination of cystic bowel changes and small amount of pneumoperitoneum noted on the prior CT Electronically Signed   By: Donavan Foil M.D.   On: 10/03/2017 21:56   Dg Chest Port 1 View  Result Date: 09/19/2017 CLINICAL DATA:  Nausea vomiting and hypotension EXAM: PORTABLE CHEST 1 VIEW COMPARISON:  06/02/2017, 03/04/2017 FINDINGS: Low lung volumes without acute opacity or pleural effusion. Stable cardiomediastinal silhouette. No pneumothorax. Lucency in the right upper quadrant, most likely due to colon interposition. IMPRESSION: No active disease. Electronically Signed   By: Donavan Foil M.D.   On: 10/09/2017 18:39    Microbiology Recent Results (from the past 240 hour(s))  Blood Culture (routine x 2)     Status: None (Preliminary result)   Collection Time: 09/22/2017  6:21 PM  Result Value Ref Range Status   Specimen Description BLOOD BLOOD LEFT FOREARM  Final   Special Requests Blood Culture adequate volume  Final   Culture   Final    NO GROWTH < 24 HOURS Performed at [facility], [address], [place], [place] [address]    Report Status PENDING  Incomplete  Blood Culture (routine x 2)     Status: None (Preliminary result)   Collection Time: 09/28/2017  6:21 PM  Result Value Ref Range Status   Specimen Description BLOOD RIGHT ANTECUBITAL  Final   Special Requests Blood Culture adequate volume  Final   Culture   Final    NO GROWTH < 24 HOURS Performed at [facility], [address] [place], [place] [address]    Report Status PENDING  Incomplete    Lab Basic Metabolic Panel: Recent Labs  Lab 09/18/2017 1757 10-08-2017 0530  NA 138 140  K 3.6 3.6  CL 93* 95*  CO2 28 27  GLUCOSE 154* 167*  BUN 41* 45*  CREATININE 1.88* 2.59*  CALCIUM 9.9 8.6*   Liver Function Tests: Recent Labs  Lab 09/18/2017 1757  AST 31  ALT 16  ALKPHOS 61  BILITOT  1.4*  PROT 10.7*  ALBUMIN 4.7   No results for input(s): LIPASE, AMYLASE in the last 168 hours. No results for input(s): AMMONIA in the last 168 hours. CBC: Recent Labs  Lab 09/22/2017 1757 Oct 08, 2017 0530  WBC 3.2* 1.5*  NEUTROABS 2.7  --   HGB 13.5 11.5*  HCT 40.7 34.9*  MCV 102.6* 103.4*  PLT 185 152   Cardiac Enzymes: Recent Labs  Lab 10/08/2017 1757  TROPONINI 0.04*   Sepsis Labs: Recent Labs  Lab 10/07/2017 1757 10/10/2017 1820 10-08-2017 0530  WBC 3.2*  --  1.5*  LATICACIDVEN  --  1.1  --     Procedures/Operations  none   He[place] Wandell J 10-08-17, 1:07 PM

## 2017-10-18 NOTE — Progress Notes (Signed)
   [place] [facility] Day: 0 days Mia Padilla is a 83 y.o. female presenting with Emesis And noted to have a high-grade bowel obstruction with perforated viscus.  Patient is critically ill and in septic shock with multiorgan failure.  Pt's prognosis is very poor given her advanced age and comorbidities and the family does not want to pursue any aggressive care.  Patient is already a DNR and the family wants to make the patient comfortable and therefore comfort care orders have been placed.  Will discontinue vital signs, telemetry, placed on as needed Ativan, morphine.  Advance care planning discussed with patient  with additional Family at bedside. All questions in regards to overall condition and expected prognosis answered. The decision was made to change current code status  CODE STATUS: Comfort Care Only.  Time spent: 16 minutes

## 2017-10-18 NOTE — Progress Notes (Signed)
Pt desat to 60-70%. Pt placed on 2L [place] at this time.

## 2017-10-18 NOTE — H&P (Signed)
[facility] NAME: Mia Padilla    MR#:  [id number]  DATE OF BIRTH:  [birth year]  DATE OF ADMISSION:  09/23/2017  PRIMARY CARE PHYSICIAN: [name], MD   REQUESTING/REFERRING PHYSICIAN: Quentin Cornwall, MD  CHIEF COMPLAINT:   Chief Complaint  Patient presents with  . Emesis    HISTORY OF PRESENT ILLNESS:  Mia Padilla  is a 83 y.o. female who presents with 2 days of profound nausea vomiting with abdominal pain.  She came to the ED today for evaluation and was found on imaging to have high-grade small bowel obstruction with peritoneal free air.  She meets sepsis criteria.  Family does not wish to pursue surgical intervention given the patient's high likelihood of not recovering well from surgery.  Hospitalist called for admission  PAST MEDICAL HISTORY:   Past Medical History:  Diagnosis Date  . Arthritis    hands  . Black head   . Bowel incontinence   . Breast cancer ([place]) 11/15/2013  . Breast cancer of upper-outer quadrant of left female breast ([place]) 2015   Left  . Carotid artery stenosis    a. ultrasound 10/8293: 6-21 LICA stenosis, 30-86% RICA stenosis; b. followed by vascular  . Carotid stenosis 11/17/2016  . Carpal tunnel syndrome   . Chronic combined systolic and diastolic CHF, NYHA class 3 ([place])    a. TTE 10/18: EF 40%, diffuse HK, DD, calcifief mitral annulus with mild MR, mildly dilated left atrium, normal RV cavity size and systolic function, PASP 57-84 mmHg  . COPD (chronic obstructive pulmonary disease) ([place])   . Debilitated   . Diabetes mellitus with complication ([place])   . Glaucoma   . History of seizures   . History of stress test    a. 11/18: small defect of mild severity present in the apex location felt to be 2/2 breast attenuation. Normal study  . Hyperlipidemia   . Hypertension   . Hypomagnesemia   . Hypotension   . Hypothyroidism 02/07/2015  . Mesenteric artery stenosis ([place])   . Obese 06/04/2011   . Obesity   . PAF (paroxysmal atrial fibrillation) (H[facility])    a. on Eliquis; b. CHADS2VASc 8 (CHF, HTN, age x 2, DM, vascular disease, sex category)  . Perforated duodenal ulcer ([place]) 01/2017   a. conservatively managed  . Peripheral vascular disease ([place]) 04/23/2015  . SMA stenosis ([place])    a. noted 10/18; b. conservatively managed; c. unclear if thrombus vs plaque; d. on Eliquis given PAF and Lipitor  . Urinary incontinence      PAST SURGICAL HISTORY:   Past Surgical History:  Procedure Laterality Date  . BOWEL RESECTION  2005  . BREAST SURGERY  08/03/13   left mastectomy  . CARPAL TUNNEL RELEASE Right 30 years   . MASTECTOMY     left   . PARTIAL HYSTERECTOMY    . TONSILLECTOMY    . UMBILICAL HERNIA REPAIR       SOCIAL HISTORY:   Social History   Tobacco Use  . Smoking status: Former Smoker    Last attempt to quit: 04/20/1974    Years since quitting: 43.4  . Smokeless tobacco: Never Used  . Tobacco comment: Quit 1976  Substance Use Topics  . Alcohol use: No     FAMILY HISTORY:   Family History  Problem Relation Age of Onset  . Cervical cancer Mother   . Diabetes Mother   . Glaucoma Mother   .  Cervical cancer Daughter   . Hypertension Daughter   . Hypertension Father   . Hypertension Sister   . Hypertension Brother   . Hypertension Son   . Diabetes Brother   . HIV Brother   . Breast cancer Neg Hx      DRUG ALLERGIES:   Allergies  Allergen Reactions  . Propoxyphene Other (See Comments)    Other Reaction: Severe Headache  . Advil [Ibuprofen] Other (See Comments)    Makes her heart race  . Darvocet [Propoxyphene N-Acetaminophen]   . Percocet [Oxycodone-Acetaminophen]     hallucination    MEDICATIONS AT HOME:   Prior to Admission medications   Medication Sig Start Date End Date Taking? Authorizing Provider  acetaminophen (TYLENOL) 650 MG CR tablet Take 650 mg by mouth every 8 (eight) hours as needed for pain.   Yes [provider]   amLODipine-olmesartan (AZOR) 5-40 MG tablet Take 0.5 tablets by mouth daily. 07/13/17  Yes Minna Merritts, MD  anastrozole (ARIMIDEX) 1 MG tablet TAKE 1 TABLET BY MOUTH ONCE DAILY 06/16/17  Yes Byrnett, Forest Gleason, MD  apixaban (ELIQUIS) 5 MG TABS tablet Take 5 mg by mouth 2 (two) times daily.   Yes [provider]  atorvastatin (LIPITOR) 20 MG tablet TAKE 1 TABLET BY MOUTH ONCE DAILY AT  6PM 09/23/17  Yes Crissman, Mark A, MD  brimonidine (ALPHAGAN P) 0.1 % SOLN 2 (two) times daily.    Yes [provider]  cyanocobalamin (,VITAMIN B-12,) 1000 MCG/ML injection Inject 1 mL (1,000 mcg total) into the muscle every 30 (thirty) days. 09/27/17  Yes Crissman, Jeannette How, MD  dorzolamide (TRUSOPT) 2 % ophthalmic solution 1 drop 2 (two) times daily.    Yes [provider]  fluticasone (FLONASE) 50 MCG/ACT nasal spray Place 2 sprays into both nostrils daily. 10/14/15  Yes Volney American, PA-C  Fluticasone-Salmeterol (ADVAIR) 250-50 MCG/DOSE AEPB Inhale 1 puff into the lungs every 12 (twelve) hours. 08/18/16  Yes Crissman, Jeannette How, MD  latanoprost (XALATAN) 0.005 % ophthalmic solution Place 1 drop into both eyes at bedtime.   Yes [provider]  LEVEMIR FLEXTOUCH 100 UNIT/ML Pen Inject 6 Units into the skin daily at 10 pm. INJECT 6 UNITS PER DAY 01/31/17  Yes Wieting, Richard, MD  levothyroxine (SYNTHROID, LEVOTHROID) 100 MCG tablet Take 1 tablet (100 mcg total) by mouth daily. 11/19/16  Yes Crissman, Jeannette How, MD  magnesium oxide (MAG-OX) 400 MG tablet Take 1 tablet (400 mg total) by mouth 2 (two) times daily. 04/07/17  Yes Theora Gianotti, NP  metoprolol succinate (TOPROL-XL) 100 MG 24 hr tablet Take 1 tablet (100 mg total) by mouth daily. 08/18/16  Yes Crissman, Jeannette How, MD  mometasone-formoterol (DULERA) 200-5 MCG/ACT AERO Inhale 2 puffs into the lungs 2 (two) times daily. 02/09/17  Yes Johnson, Megan P, DO  nystatin (MYCOSTATIN/NYSTOP) powder Apply topically 4 (four)  times daily.   Yes [provider]  Olopatadine HCl (PATADAY) 0.2 % SOLN Apply 1 drop to eye daily.    Yes [provider]  pantoprazole (PROTONIX) 40 MG tablet Take 1 tablet (40 mg total) by mouth daily. 06/15/17 06/15/18 Yes Gollan, Kathlene November, MD  potassium chloride SA (K-DUR,KLOR-CON) 20 MEQ tablet Take 1 tablet (20 mEq total) by mouth 2 (two) times daily. 06/15/17  Yes Minna Merritts, MD  solifenacin (VESICARE) 10 MG tablet Take 1 tablet (10 mg total) daily by mouth. 03/05/17  Yes Crissman, Jeannette How, MD  blood glucose meter  kit and supplies Diagnosis:E11.9 Test glucose twice daily 11/21/14   Johnson, Megan P, DO  feeding supplement, ENSURE ENLIVE, (ENSURE ENLIVE) LIQD Take 237 mLs by mouth 2 (two) times daily between meals. Patient not taking: Reported on 08/19/2017 01/31/17   Loletha Grayer, MD  glucose blood test strip Use as instructed Patient not taking: Reported on 08/19/2017 11/05/14   [name], MD  mometasone (ELOCON) 0.1 % cream Apply 1 application topically daily. Apply 1-2 times daily for no longer than 2 weeks at a time 11/24/16   Volney American, PA-C  torsemide (DEMADEX) 20 MG tablet Take 2 tablets (40 mg total) by mouth 2 (two) times daily. 06/15/17 09/13/17  Minna Merritts, MD    REVIEW OF SYSTEMS:  Review of Systems  Unable to perform ROS: Acuity of condition     VITAL SIGNS:   Vitals:   09/20/2017 2300 10/03/2017 2330 2017/10/18 0000 Oct 18, 2017 0030  BP: (!) 105/57 118/63 (!) 110/56 113/69  Pulse: 100 99 98 (!) 110  Resp:      Temp:      TempSrc:      SpO2: 93% 95% 94% 94%  Weight:      Height:       Wt Readings from Last 3 Encounters:  09/25/2017 82.1 kg (181 lb)  08/19/17 82.2 kg (181 lb 4 oz)  07/13/17 76.8 kg (169 lb 4 oz)    PHYSICAL EXAMINATION:  Physical Exam  Vitals reviewed. Constitutional: She appears well-developed and well-nourished. No distress.  HENT:  Head: Normocephalic and atraumatic.  Dry mucous membranes  Eyes: Pupils  are equal, round, and reactive to light. Conjunctivae and EOM are normal. No scleral icterus.  Neck: Normal range of motion. Neck supple. No JVD present. No thyromegaly present.  Cardiovascular: Regular rhythm and intact distal pulses. Exam reveals no gallop and no friction rub.  No murmur heard. Tachycardic  Respiratory: Effort normal and breath sounds normal. No respiratory distress. She has no wheezes. She has no rales.  GI: Soft. Bowel sounds are normal. She exhibits no distension. There is tenderness.  Musculoskeletal: Normal range of motion. She exhibits no edema.  No arthritis, no gout  Lymphadenopathy:    She has no cervical adenopathy.  Neurological:  Unable to assess due to patient condition  Skin: Skin is warm and dry. No rash noted. No erythema.  Psychiatric:  Unable to assess due to patient condition    LABORATORY PANEL:   CBC Recent Labs  Lab 09/30/2017 1757  WBC 3.2*  HGB 13.5  HCT 40.7  PLT 185   ------------------------------------------------------------------------------------------------------------------  Chemistries  Recent Labs  Lab 10/14/2017 1757  NA 138  K 3.6  CL 93*  CO2 28  GLUCOSE 154*  BUN 41*  CREATININE 1.88*  CALCIUM 9.9  AST 31  ALT 16  ALKPHOS 61  BILITOT 1.4*   ------------------------------------------------------------------------------------------------------------------  Cardiac Enzymes Recent Labs  Lab 09/30/2017 1757  TROPONINI 0.04*   ------------------------------------------------------------------------------------------------------------------  RADIOLOGY:  Ct Abdomen Pelvis Wo Contrast  Result Date: 09/26/2017 CLINICAL DATA:  Nausea vomiting and abdominal pain EXAM: CT ABDOMEN AND PELVIS WITHOUT CONTRAST TECHNIQUE: Multidetector CT imaging of the abdomen and pelvis was performed following the standard protocol without IV contrast. COMPARISON:  MRI 06/10/2017, CT abdomen pelvis 02/22/2017 FINDINGS: Lower chest: Lung  bases demonstrate no acute consolidation or pleural effusion. Heart size is within normal limits. Hepatobiliary: No focal liver abnormality is seen. No gallstones, gallbladder wall thickening, or biliary dilatation. Pancreas: Unremarkable. No pancreatic ductal dilatation  or surrounding inflammatory changes. Spleen: Splenic granuloma. Adrenals/Urinary Tract: Adrenal glands are within normal limits. No hydronephrosis. Cysts within the upper poles of the kidneys. Multiple stones in the right kidney, largest is seen in the upper pole and measures 8 mm. Dominant stone lower pole left kidney measuring 5 mm. The bladder is nearly empty and is slightly thick-walled. Stomach/Bowel: Low lying stomach. Moderate fluid distention of the stomach. Multiple fluid-filled dilated loops of small bowel, measuring up to 6 cm in diameter. Transition point visualized in the right upper quadrant, series 2, image number 21 where there is swirled appearance of bowel and mesentery. Loop of bowel in the right upper quadrant, anterior to the liver with multiple cystic air collections at the bowel periphery suspect for cystic pneumatosis, increased compared to the prior study. Distal small bowel and colon are completely collapsed. Vascular/Lymphatic: Moderate aortic atherosclerosis. No aneurysmal dilatation. No significantly enlarged lymph nodes. Reproductive: Status post hysterectomy. No adnexal masses. Other: No significant free fluid. Small foci of free air within the anterior abdomen. Musculoskeletal: Degenerative changes of the spine. No acute or suspicious abnormality. Advanced arthritis of the left hip. IMPRESSION: 1. High-grade small bowel obstruction with transition point visualized in the right upper quadrant. Swirled appearance of bowel and mesentery at the transition point, raising concern for volvulus/closed loop obstruction. Small bowel within the right upper quadrant distal to the point of obstruction is abnormal in morphology  with multiple peripheral cystic collections, suspected to represent cystic pneumatosis, present on prior exams but increased in the interim. Additional finding of small foci of pneumoperitoneum, possibly due to the pneumatosis although hollow viscus perforation is also considered. 2. Multiple kidney stones without hydronephrosis. Thick-walled bladder, possibly due to under distension although cystitis is also possible. Critical Value/emergent results were called by telephone at the time of interpretation on 10/03/2017 at 8:56 pm to Dr. Merlyn Lot , who verbally acknowledged these results. Electronically Signed   By: Donavan Foil M.D.   On: 10/10/2017 20:57   Dg Chest Portable 1 View  Result Date: 09/25/2017 CLINICAL DATA:  Verify NG tube placement EXAM: PORTABLE CHEST 1 VIEW COMPARISON:  09/29/2017 FINDINGS: Esophageal tube is folded back upon itself over the distal esophagus, tip is directed cephalad. Low lung volumes without acute opacity. Cardiomegaly. Cystic lucencies in the right upper quadrant, likely corresponding to combination of diffuse cystic bowel disease and small free air noted on the comparison CT. IMPRESSION: 1. Esophageal tube tip is folded back upon itself over the distal esophagus with the tip directed cephalad 2. Lucency in the right upper quadrant, likely reflecting combination of cystic bowel changes and small amount of pneumoperitoneum noted on the prior CT Electronically Signed   By: Donavan Foil M.D.   On: 09/30/2017 21:56   Dg Chest Port 1 View  Result Date: 10/05/2017 CLINICAL DATA:  Nausea vomiting and hypotension EXAM: PORTABLE CHEST 1 VIEW COMPARISON:  06/02/2017, 03/04/2017 FINDINGS: Low lung volumes without acute opacity or pleural effusion. Stable cardiomediastinal silhouette. No pneumothorax. Lucency in the right upper quadrant, most likely due to colon interposition. IMPRESSION: No active disease. Electronically Signed   By: Donavan Foil M.D.   On: 10/15/2017 18:39     EKG:   Orders placed or performed during the hospital encounter of 10/04/2017  . ED EKG 12-Lead  . ED EKG 12-Lead    IMPRESSION AND PLAN:  Principal Problem:   Severe sepsis ([facility]CC) -lactic acid within normal limits, blood pressure low end normotensive, sepsis likely from possible peritoneal  infection, IV antibiotics started, cultures sent Active Problems:   SBO (small bowel obstruction) ([facility]CC) -NG tube in place, IV antibiotics, general surgery following   Peritoneal free air -as above   AKI (acute kidney injury) ([place]) -IV fluids, avoid nephrotoxins and monitor   COPD (chronic obstructive pulmonary disease) (HCC) -home dose inhalers   Hypertension -hold antihypertensives for now as the patient is low end normotensive   Diabetes mellitus with complication ([facility]) -sliding scale insulin with corresponding glucose checks   Chronic diastolic heart failure ([place]) -continue home meds  Chart review performed and case discussed with ED provider. Labs, imaging and/or ECG reviewed by provider and discussed with patient/family. Management plans discussed with the patient and/or family.  DVT PROPHYLAXIS: SubQ lovenox  GI PROPHYLAXIS: None  ADMISSION STATUS: Inpatient  CODE STATUS: DNR Code Status History    Date Active Date Inactive Code Status Order ID Comments User Context   01/24/2017 0712 01/31/2017 1827 Full Code [id number]  Harrie Foreman, MD Inpatient      TOTAL TIME TAKING CARE OF THIS PATIENT: 45 minutes.   Ulrich Soules [place] 2017/10/09, 1:31 AM  [employer]  [phone]  CC: Primary care physician; [name], MD  Note:  This document was prepared using Dragon voice recognition software and may include unintentional dictation errors.

## 2017-10-18 NOTE — Progress Notes (Signed)
Assessed pt with [facility] manager. No pulse found, no heartbeat heard via auscultation x2 minutes. Terri RN confirms findings. Family at bedside, notified at same time. Paged MD via text page to notify. Order for RN to pronounce in record. Pt is a DNR.

## 2017-10-18 NOTE — Progress Notes (Signed)
Pt's family asked for privacy at this time for their family to say goodbye. Educated family to call RN when ready. Pt apneic at this time.

## 2017-10-18 NOTE — ED Notes (Signed)
Family at bedside. Pt resting quietly. No distress noted. Drainage cannister switched again.

## 2017-10-18 DEATH — deceased

## 2017-11-04 ENCOUNTER — Ambulatory Visit: Payer: Medicare Other | Admitting: Family

## 2017-11-09 ENCOUNTER — Ambulatory Visit: Payer: Medicare Other | Admitting: Family Medicine

## 2017-11-16 ENCOUNTER — Ambulatory Visit (INDEPENDENT_AMBULATORY_CARE_PROVIDER_SITE_OTHER): Payer: Medicare Other | Admitting: Vascular Surgery

## 2017-11-16 ENCOUNTER — Encounter (INDEPENDENT_AMBULATORY_CARE_PROVIDER_SITE_OTHER): Payer: Medicare Other

## 2017-11-24 ENCOUNTER — Ambulatory Visit: Payer: Medicare Other

## 2017-11-24 ENCOUNTER — Ambulatory Visit: Payer: Medicare Other | Admitting: Family Medicine

## 2017-12-02 ENCOUNTER — Ambulatory Visit: Payer: Medicare Other | Admitting: Family

## 2019-03-03 IMAGING — CT CT CTA ABD/PEL W/CM AND/OR W/O CM
2 of 9 series · 8 of 46 positions shown, 14 images · IV contrast (isovue)
Comparison: 11/22/2016

CLINICAL DATA: Generalized abdominal pain starting today. Vomiting.
Loose bowel movements. History of ventricular a rib anemia.

EXAM:
CTA ABDOMEN AND PELVIS wITHOUT AND WITH CONTRAST
TECHNIQUE: Multidetector CT imaging of the abdomen and pelvis was performed
using the standard protocol during bolus administration of
intravenous contrast. Multiplanar reconstructed images and MIPs were
obtained and reviewed to evaluate the vascular anatomy.
CONTRAST:  100 mL Isovue 370

[Series 6: axial venous · axial · portal-venous · 0.85mm/px · z∈[-549,-239]mm · 6 of 88 slices shown, 11 images]
[im 13/88  soft-tissue]
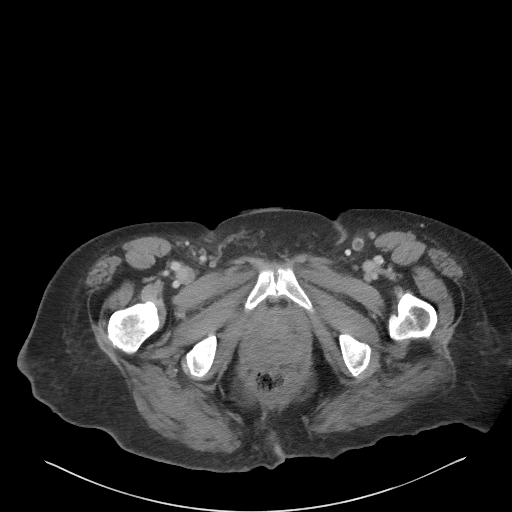
[im 13/88  bone]
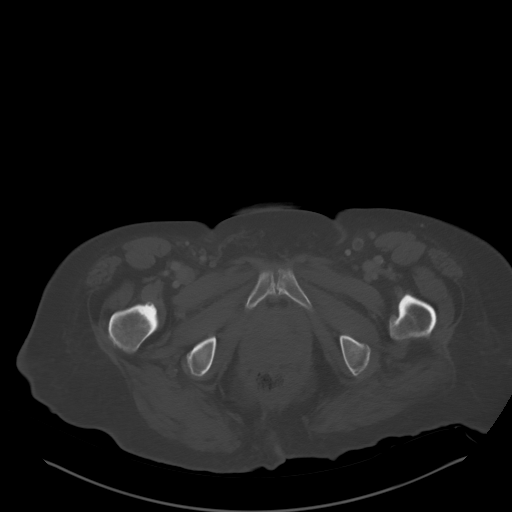
[im 25/88  soft-tissue]
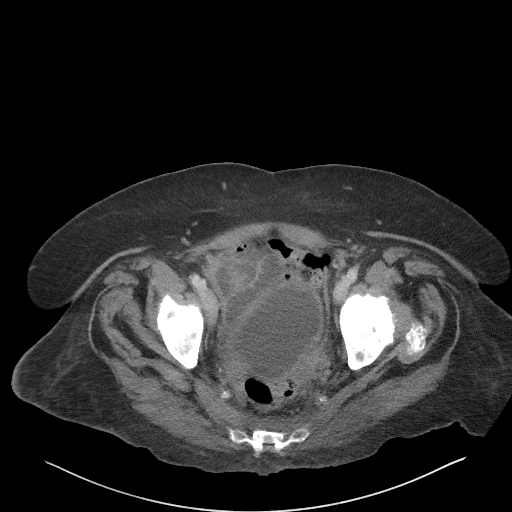
[im 38/88  soft-tissue]
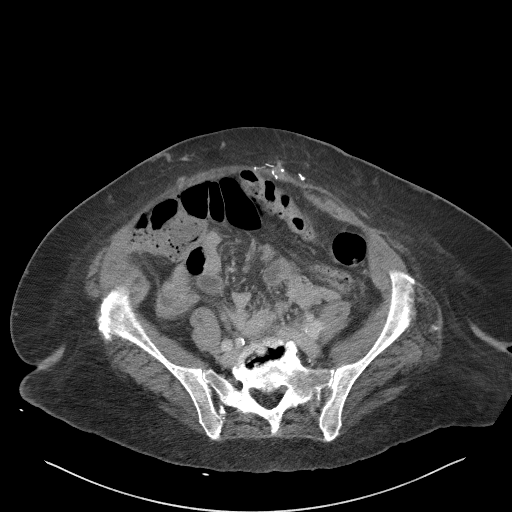
[im 38/88  lung]
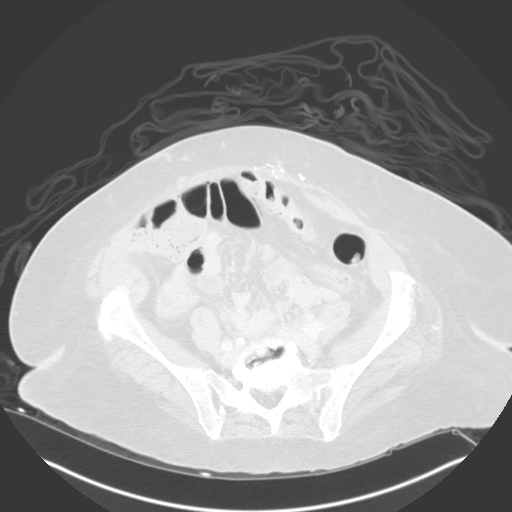
[im 50/88  soft-tissue]
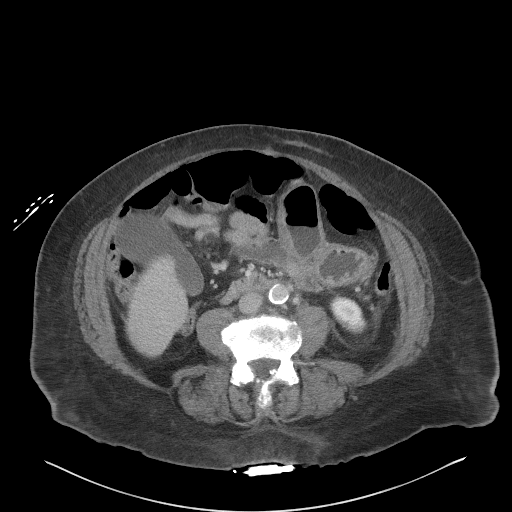
[im 50/88  lung]
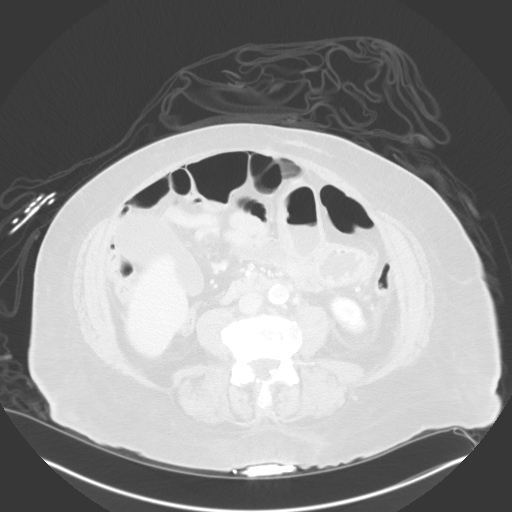
[im 63/88  soft-tissue]
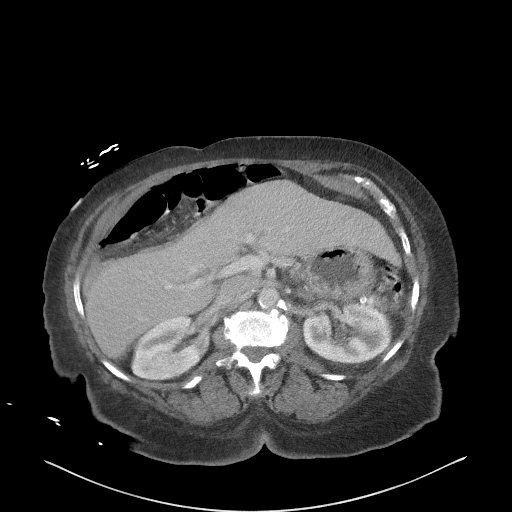
[im 63/88  lung]
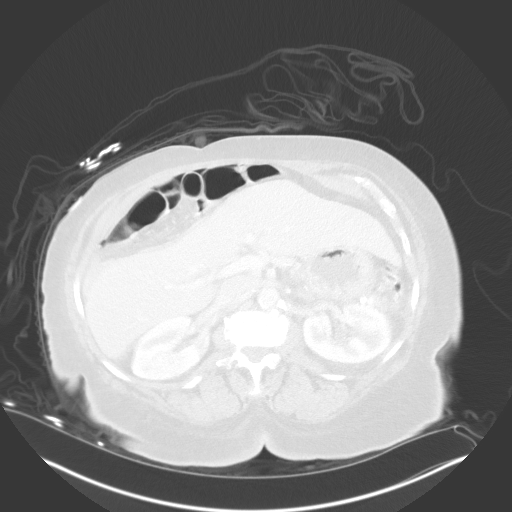
[im 75/88  soft-tissue]
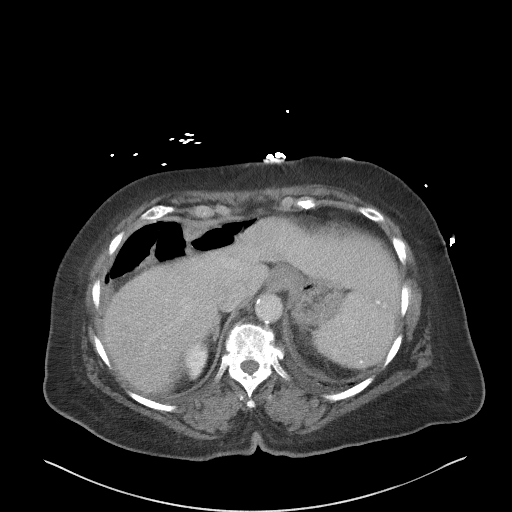
[im 75/88  lung]
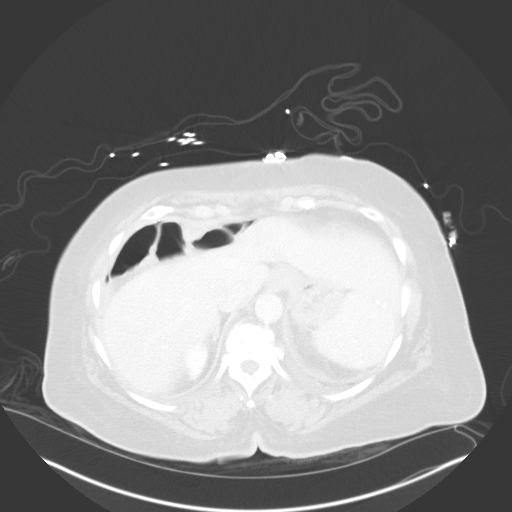

[Series 8: coronal mpr · coronal · 0.77mm/px · 2 of 144 slices shown, 3 images]
[im 48/144  soft-tissue]
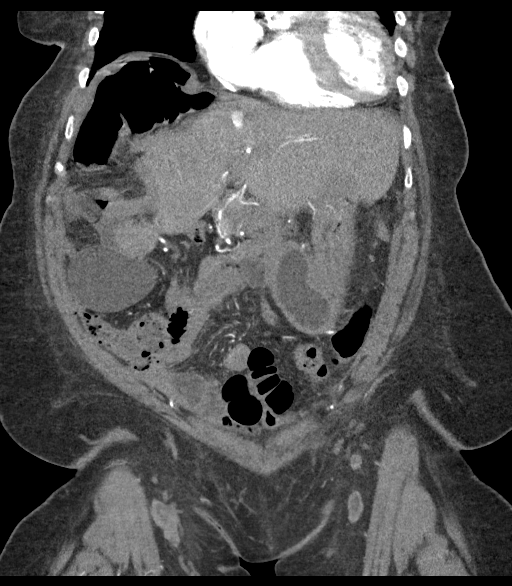
[im 48/144  bone]
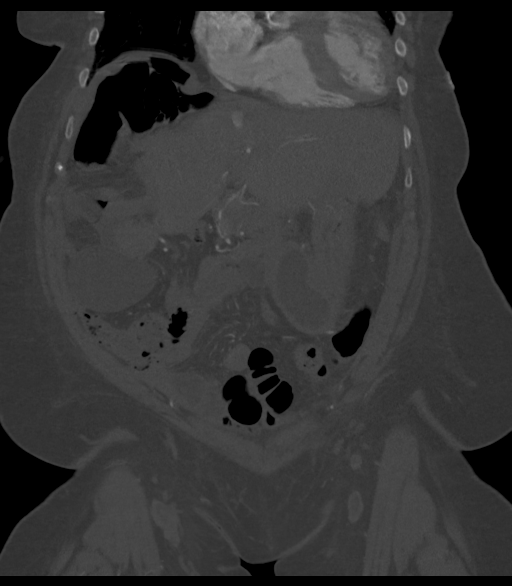
[im 96/144  soft-tissue]
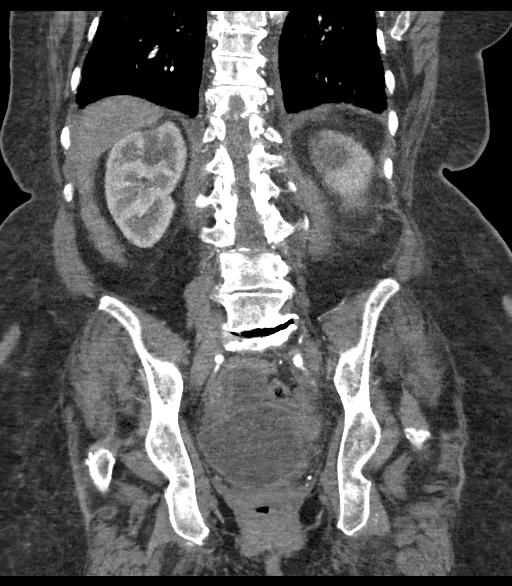

[8 of 46 positions shown; findings below may reference images not displayed]

FINDINGS: VASCULAR

Aorta: Normal caliber aorta without aneurysm, dissection, vasculitis
or significant stenosis. Diffuse aortic calcification.

Celiac: Calcification at the origin of the celiac axis but the
celiac axis and branch vessels are patent without evidence of
significant stenosis.

SMA: Calcification at the origin of the superior mesenteric artery
with scattered calcification throughout. There is focal narrowing at
the origin of the superior mesenteric artery. This represents 50% or
greater diameter reduction. The vessel remains patent through this
area of stenosis. There is a focal filling defect in 1 of the second
or third order right mesenteric branch vessels resulting and 80-90%
diameter reduction focally. This could represent thrombus or
atherosclerotic plaque. The vessel remains patent distal to this
area.

Renals: Single bilateral renal arteries are patent with
calcification at the origins.

IMA: Inferior mesenteric artery is patent.

Inflow: Patent without evidence of aneurysm, dissection, vasculitis
or significant stenosis.

Proximal Outflow: Bilateral common femoral and visualized portions
of the superficial and profunda femoral arteries are patent without
evidence of aneurysm, dissection, vasculitis or significant
stenosis.

Veins: No obvious venous abnormality within the limitations of this
arterial phase study.

Review of the MIP images confirms the above findings.

NON-VASCULAR

Lower chest: Small bilateral pleural effusions. Mosaic attenuation
pattern in the lung bases suggesting edema or diffuse pneumonia.
Cardiac enlargement. Pectus excavatum.

Hepatobiliary: The gallbladder is distended. There is suggestion of
small stones in the dependent gallbladder. No bile duct dilatation.
No focal liver lesions.

Pancreas: Unremarkable. No pancreatic ductal dilatation or
surrounding inflammatory changes.

Spleen: Calcified granulomas in the spleen.  No splenomegaly.

Adrenals/Urinary Tract: No adrenal gland nodules. Multiple
intrarenal stones bilaterally. Largest is in the right upper pole
and measures about 5 mm in diameter. Bilateral renal cysts are
incompletely characterized. No change since prior study.
Low-attenuation zone in the upper pole right kidney is again
demonstrated and incompletely characterized. This could be a focal
defect due to infarct, infection, or mass lesion. As previously
recommended on ultrasound 12/01/2016, MRI would be suggested in the
elective setting for further evaluation. No hydronephrosis or
hydroureter. The bladder wall is thickened and there is gas in the
bladder. This suggests cystitis. There is a stone in the base of the
bladder.

Stomach/Bowel: Diffuse free intraperitoneal and a small amount of
retroperitoneal air. Stomach is incompletely distended but there is
evidence of gastric wall thickening. There is gas adjacent to the
duodenum. This suggests the probability of a perforated duodenal
ulcer. Small bowel and colon are not abnormally distended and no
discrete areas of wall thickening or pneumatosis are demonstrated.
Appendix is normal. Diverticulosis of the sigmoid colon without
evidence of diverticulitis.

Lymphatic: Aortic atherosclerosis. No enlarged abdominal or pelvic
lymph nodes.

Reproductive: Status post hysterectomy. No adnexal masses.

Other: Surgical sutures in the anterior abdominal wall.

Musculoskeletal: Degenerative changes diffusely throughout the
lumbar spine. Severe degenerative changes in the left hip. No
destructive bone lesions.
IMPRESSION: VASCULAR

1. Diffuse aortic and vascular calcifications.
2. Stenosis at the origin of the celiac axis and superior mesenteric
artery due to calcific stenosis.
3. Focal filling defect in a mid mesenteric SMA branch may represent
thrombus or atherosclerotic plaque formation.

NON-VASCULAR

1. Free intraperitoneal air and a small amount of retroperitoneal
air. Suggestion of gastric wall thickening with tear adjacent to the
duodenum. High suspicion for perforated duodenal ulcer. No definite
findings to suggest bowel ischemia.
2. Small bilateral pleural effusions. Airspace disease in the lungs
probably represents edema.
3. Distended gallbladder with small stones.
4. Multiple bilateral nonobstructing intrarenal stones.
5. Hypoenhancing zone in the upper pole right kidney is unchanged
since previous study. Differential diagnosis would include infarct,
infection, or mass. As recommended on prior ultrasound, elective MRI
is recommended for further evaluation.
6. Bladder wall thickening with gas in the bladder suggesting
cystitis unless there has been instrumentation. Small bladder stone.
7. Calcified splenic granulomas.
These results were called by telephone at the time of interpretation
on 01/24/2017 at [DATE] to Dr. JHEMBOY PADAM , who verbally
acknowledged these results.
